# Patient Record
Sex: Male | Born: 1935 | Race: White | Hispanic: No | Marital: Married | Smoking: Former smoker
Health system: Southern US, Community
[De-identification: ages and names within clinical notes are randomized; demographics above are authoritative.]

## PROBLEM LIST (undated history)

## (undated) DIAGNOSIS — I447 Left bundle-branch block, unspecified: Secondary | ICD-10-CM

## (undated) DIAGNOSIS — I429 Cardiomyopathy, unspecified: Secondary | ICD-10-CM

## (undated) DIAGNOSIS — N183 Chronic kidney disease, stage 3 unspecified: Secondary | ICD-10-CM

## (undated) DIAGNOSIS — D72819 Decreased white blood cell count, unspecified: Secondary | ICD-10-CM

## (undated) DIAGNOSIS — Z7729 Contact with and (suspected ) exposure to other hazardous substances: Secondary | ICD-10-CM

## (undated) DIAGNOSIS — G4730 Sleep apnea, unspecified: Secondary | ICD-10-CM

## (undated) DIAGNOSIS — I10 Essential (primary) hypertension: Secondary | ICD-10-CM

## (undated) DIAGNOSIS — E119 Type 2 diabetes mellitus without complications: Secondary | ICD-10-CM

## (undated) DIAGNOSIS — E669 Obesity, unspecified: Secondary | ICD-10-CM

## (undated) HISTORY — DX: Contact with and (suspected) exposure to other hazardous substances: Z77.29

## (undated) HISTORY — PX: NASAL POLYP SURGERY: SHX186

## (undated) HISTORY — DX: Decreased white blood cell count, unspecified: D72.819

## (undated) HISTORY — DX: Obesity, unspecified: E66.9

---

## 2002-11-05 ENCOUNTER — Ambulatory Visit (HOSPITAL_COMMUNITY): Admission: RE | Admit: 2002-11-05 | Discharge: 2002-11-05 | Payer: Self-pay | Admitting: Pulmonary Disease

## 2002-12-15 ENCOUNTER — Ambulatory Visit (HOSPITAL_COMMUNITY): Admission: RE | Admit: 2002-12-15 | Discharge: 2002-12-15 | Payer: Self-pay | Admitting: Otolaryngology

## 2002-12-15 ENCOUNTER — Encounter: Payer: Self-pay | Admitting: Otolaryngology

## 2003-03-31 ENCOUNTER — Encounter: Payer: Self-pay | Admitting: Otolaryngology

## 2003-03-31 ENCOUNTER — Encounter: Admission: RE | Admit: 2003-03-31 | Discharge: 2003-03-31 | Payer: Self-pay | Admitting: Otolaryngology

## 2003-04-01 ENCOUNTER — Encounter: Payer: Self-pay | Admitting: Otolaryngology

## 2003-04-01 ENCOUNTER — Ambulatory Visit (HOSPITAL_COMMUNITY): Admission: RE | Admit: 2003-04-01 | Discharge: 2003-04-01 | Payer: Self-pay | Admitting: Otolaryngology

## 2003-04-02 ENCOUNTER — Ambulatory Visit (HOSPITAL_COMMUNITY): Admission: RE | Admit: 2003-04-02 | Discharge: 2003-04-02 | Payer: Self-pay | Admitting: Otolaryngology

## 2003-04-02 ENCOUNTER — Encounter: Payer: Self-pay | Admitting: Otolaryngology

## 2003-04-12 ENCOUNTER — Encounter (INDEPENDENT_AMBULATORY_CARE_PROVIDER_SITE_OTHER): Payer: Self-pay | Admitting: *Deleted

## 2003-04-12 ENCOUNTER — Ambulatory Visit (HOSPITAL_BASED_OUTPATIENT_CLINIC_OR_DEPARTMENT_OTHER): Admission: RE | Admit: 2003-04-12 | Discharge: 2003-04-12 | Payer: Self-pay | Admitting: Otolaryngology

## 2006-12-31 ENCOUNTER — Encounter (HOSPITAL_COMMUNITY): Admission: RE | Admit: 2006-12-31 | Discharge: 2007-01-30 | Payer: Self-pay | Admitting: Oncology

## 2006-12-31 ENCOUNTER — Ambulatory Visit (HOSPITAL_COMMUNITY): Payer: Self-pay | Admitting: Oncology

## 2007-01-31 ENCOUNTER — Encounter (HOSPITAL_COMMUNITY): Admission: RE | Admit: 2007-01-31 | Discharge: 2007-03-02 | Payer: Self-pay | Admitting: Oncology

## 2007-02-28 ENCOUNTER — Ambulatory Visit (HOSPITAL_COMMUNITY): Payer: Self-pay | Admitting: Oncology

## 2007-04-01 ENCOUNTER — Encounter (HOSPITAL_COMMUNITY): Admission: RE | Admit: 2007-04-01 | Discharge: 2007-05-01 | Payer: Self-pay | Admitting: Oncology

## 2007-04-29 ENCOUNTER — Ambulatory Visit (HOSPITAL_COMMUNITY): Payer: Self-pay | Admitting: Oncology

## 2007-10-29 ENCOUNTER — Encounter (HOSPITAL_COMMUNITY): Admission: RE | Admit: 2007-10-29 | Discharge: 2007-11-28 | Payer: Self-pay | Admitting: Oncology

## 2007-10-29 ENCOUNTER — Ambulatory Visit (HOSPITAL_COMMUNITY): Payer: Self-pay | Admitting: Oncology

## 2007-12-01 ENCOUNTER — Encounter (HOSPITAL_COMMUNITY): Admission: RE | Admit: 2007-12-01 | Discharge: 2007-12-31 | Payer: Self-pay | Admitting: Oncology

## 2007-12-24 ENCOUNTER — Ambulatory Visit (HOSPITAL_COMMUNITY): Payer: Self-pay | Admitting: Oncology

## 2008-06-28 ENCOUNTER — Ambulatory Visit (HOSPITAL_COMMUNITY): Payer: Self-pay | Admitting: Oncology

## 2009-06-27 ENCOUNTER — Ambulatory Visit (HOSPITAL_COMMUNITY): Payer: Self-pay | Admitting: Oncology

## 2009-08-24 ENCOUNTER — Ambulatory Visit (HOSPITAL_COMMUNITY): Payer: Self-pay | Admitting: Oncology

## 2009-08-24 ENCOUNTER — Encounter (HOSPITAL_COMMUNITY): Admission: RE | Admit: 2009-08-24 | Discharge: 2009-09-23 | Payer: Self-pay | Admitting: Oncology

## 2010-02-23 ENCOUNTER — Ambulatory Visit (HOSPITAL_COMMUNITY): Payer: Self-pay | Admitting: Oncology

## 2010-02-23 ENCOUNTER — Encounter (HOSPITAL_COMMUNITY)
Admission: RE | Admit: 2010-02-23 | Discharge: 2010-03-25 | Payer: Self-pay | Source: Home / Self Care | Admitting: Oncology

## 2010-06-15 ENCOUNTER — Ambulatory Visit (HOSPITAL_COMMUNITY): Admission: RE | Admit: 2010-06-15 | Discharge: 2010-06-15 | Payer: Self-pay | Admitting: Ophthalmology

## 2010-06-29 ENCOUNTER — Ambulatory Visit (HOSPITAL_COMMUNITY): Admission: RE | Admit: 2010-06-29 | Discharge: 2010-06-29 | Payer: Self-pay | Admitting: Ophthalmology

## 2010-07-22 ENCOUNTER — Inpatient Hospital Stay (HOSPITAL_COMMUNITY)
Admission: EM | Admit: 2010-07-22 | Discharge: 2010-07-25 | Payer: Self-pay | Source: Home / Self Care | Admitting: Emergency Medicine

## 2010-08-25 ENCOUNTER — Ambulatory Visit (HOSPITAL_COMMUNITY)
Admission: RE | Admit: 2010-08-25 | Discharge: 2010-08-25 | Payer: Self-pay | Source: Home / Self Care | Attending: Pulmonary Disease | Admitting: Pulmonary Disease

## 2010-08-25 ENCOUNTER — Ambulatory Visit (HOSPITAL_COMMUNITY)
Admission: RE | Admit: 2010-08-25 | Discharge: 2010-09-19 | Payer: Self-pay | Source: Home / Self Care | Attending: Oncology | Admitting: Oncology

## 2010-08-25 ENCOUNTER — Encounter (HOSPITAL_COMMUNITY)
Admission: RE | Admit: 2010-08-25 | Discharge: 2010-09-19 | Payer: Self-pay | Source: Home / Self Care | Attending: Oncology | Admitting: Oncology

## 2010-08-25 LAB — BASIC METABOLIC PANEL
BUN: 38 mg/dL — ABNORMAL HIGH (ref 6–23)
CO2: 26 mEq/L (ref 19–32)
Calcium: 9.2 mg/dL (ref 8.4–10.5)
Chloride: 109 mEq/L (ref 96–112)
Creatinine, Ser: 1.77 mg/dL — ABNORMAL HIGH (ref 0.4–1.5)
GFR calc Af Amer: 45 mL/min — ABNORMAL LOW (ref >60)
GFR calc non Af Amer: 38 mL/min — ABNORMAL LOW (ref >60)
Glucose, Bld: 105 mg/dL — ABNORMAL HIGH (ref 70–99)
Potassium: 4.7 mEq/L (ref 3.5–5.1)
Sodium: 141 mEq/L (ref 135–145)

## 2010-08-25 LAB — CBC
HCT: 31.2 % — ABNORMAL LOW (ref 39.0–52.0)
Hemoglobin: 10.2 g/dL — ABNORMAL LOW (ref 13.0–17.0)
MCH: 32.5 pg (ref 26.0–34.0)
MCHC: 32.7 g/dL (ref 30.0–36.0)
MCV: 99.4 fL (ref 78.0–100.0)
Platelets: 136 10*3/uL — ABNORMAL LOW (ref 150–400)
RBC: 3.14 MIL/uL — ABNORMAL LOW (ref 4.22–5.81)
RDW: 14.2 % (ref 11.5–15.5)
WBC: 4.2 10*3/uL (ref 4.0–10.5)

## 2010-08-25 LAB — DIFFERENTIAL
Basophils Absolute: 0 10*3/uL (ref 0.0–0.1)
Basophils Relative: 1 % (ref 0–1)
Eosinophils Absolute: 0.2 10*3/uL (ref 0.0–0.7)
Eosinophils Relative: 5 % (ref 0–5)
Lymphocytes Relative: 35 % (ref 12–46)
Lymphs Abs: 1.5 10*3/uL (ref 0.7–4.0)
Monocytes Absolute: 0.3 10*3/uL (ref 0.1–1.0)
Monocytes Relative: 8 % (ref 3–12)
Neutro Abs: 2.2 10*3/uL (ref 1.7–7.7)
Neutrophils Relative %: 52 % (ref 43–77)

## 2010-08-25 LAB — RETICULOCYTES
RBC.: 3.14 MIL/uL — ABNORMAL LOW (ref 4.22–5.81)
Retic Count, Absolute: 28.3 10*3/uL (ref 19.0–186.0)
Retic Ct Pct: 0.9 % (ref 0.4–3.1)

## 2010-09-04 LAB — IRON AND TIBC
Iron: 88 ug/dL (ref 42–135)
Saturation Ratios: 30 % (ref 20–55)
TIBC: 292 ug/dL (ref 215–435)
UIBC: 204 ug/dL

## 2010-09-04 LAB — FOLATE: Folate: 11.1 ng/mL

## 2010-09-04 LAB — VITAMIN B12: Vitamin B-12: 283 pg/mL (ref 211–911)

## 2010-09-04 LAB — FERRITIN: Ferritin: 63 ng/mL (ref 22–322)

## 2010-10-31 LAB — CULTURE, BLOOD (ROUTINE X 2)
Culture: NO GROWTH
Culture: NO GROWTH

## 2010-10-31 LAB — CBC
HCT: 29.1 % — ABNORMAL LOW (ref 39.0–52.0)
HCT: 30.8 % — ABNORMAL LOW (ref 39.0–52.0)
Hemoglobin: 10 g/dL — ABNORMAL LOW (ref 13.0–17.0)
Hemoglobin: 9.6 g/dL — ABNORMAL LOW (ref 13.0–17.0)
MCH: 32.1 pg (ref 26.0–34.0)
MCH: 32.7 pg (ref 26.0–34.0)
MCHC: 32.5 g/dL (ref 30.0–36.0)
MCHC: 33 g/dL (ref 30.0–36.0)
MCV: 98.7 fL (ref 78.0–100.0)
MCV: 99 fL (ref 78.0–100.0)
Platelets: 174 10*3/uL (ref 150–400)
Platelets: 182 K/uL (ref 150–400)
RBC: 2.94 MIL/uL — ABNORMAL LOW (ref 4.22–5.81)
RBC: 3.12 MIL/uL — ABNORMAL LOW (ref 4.22–5.81)
RDW: 13.9 % (ref 11.5–15.5)
RDW: 14 % (ref 11.5–15.5)
WBC: 4 10*3/uL (ref 4.0–10.5)
WBC: 7.9 K/uL (ref 4.0–10.5)

## 2010-10-31 LAB — BLOOD GAS, ARTERIAL
Acid-base deficit: 4.4 mmol/L — ABNORMAL HIGH (ref 0.0–2.0)
Bicarbonate: 19.6 mEq/L — ABNORMAL LOW (ref 20.0–24.0)
O2 Content: 4 L/min
O2 Saturation: 94.6 %
Patient temperature: 40
TCO2: 18.5 mmol/L (ref 0–100)
pCO2 arterial: 32.5 mmHg — ABNORMAL LOW (ref 35.0–45.0)
pH, Arterial: 7.397 (ref 7.350–7.450)
pO2, Arterial: 70.5 mmHg — ABNORMAL LOW (ref 80.0–100.0)

## 2010-10-31 LAB — DIFFERENTIAL
Basophils Absolute: 0 10*3/uL (ref 0.0–0.1)
Basophils Absolute: 0 10*3/uL (ref 0.0–0.1)
Basophils Relative: 0 % (ref 0–1)
Basophils Relative: 0 % (ref 0–1)
Eosinophils Absolute: 0.1 10*3/uL (ref 0.0–0.7)
Eosinophils Absolute: 0.2 10*3/uL (ref 0.0–0.7)
Eosinophils Relative: 2 % (ref 0–5)
Eosinophils Relative: 3 % (ref 0–5)
Lymphocytes Relative: 11 % — ABNORMAL LOW (ref 12–46)
Lymphocytes Relative: 6 % — ABNORMAL LOW (ref 12–46)
Lymphs Abs: 0.2 10*3/uL — ABNORMAL LOW (ref 0.7–4.0)
Monocytes Absolute: 0.2 10*3/uL (ref 0.1–1.0)
Monocytes Absolute: 0.5 10*3/uL (ref 0.1–1.0)
Monocytes Relative: 6 % (ref 3–12)
Neutro Abs: 3.5 10*3/uL (ref 1.7–7.7)
Neutrophils Relative %: 86 % — ABNORMAL HIGH (ref 43–77)

## 2010-10-31 LAB — BASIC METABOLIC PANEL
BUN: 29 mg/dL — ABNORMAL HIGH (ref 6–23)
BUN: 42 mg/dL — ABNORMAL HIGH (ref 6–23)
CO2: 21 mEq/L (ref 19–32)
Calcium: 8.7 mg/dL (ref 8.4–10.5)
Chloride: 108 mEq/L (ref 96–112)
Chloride: 114 mEq/L — ABNORMAL HIGH (ref 96–112)
Creatinine, Ser: 1.68 mg/dL — ABNORMAL HIGH (ref 0.4–1.5)
Creatinine, Ser: 2.31 mg/dL — ABNORMAL HIGH (ref 0.4–1.5)
GFR calc Af Amer: 33 mL/min — ABNORMAL LOW (ref >60)
GFR calc non Af Amer: 28 mL/min — ABNORMAL LOW (ref >60)
Glucose, Bld: 136 mg/dL — ABNORMAL HIGH (ref 70–99)
Glucose, Bld: 206 mg/dL — ABNORMAL HIGH (ref 70–99)
Potassium: 4.4 mEq/L (ref 3.5–5.1)
Sodium: 138 mEq/L (ref 135–145)

## 2010-10-31 LAB — URINALYSIS, ROUTINE W REFLEX MICROSCOPIC
Bilirubin Urine: NEGATIVE
Glucose, UA: NEGATIVE mg/dL
Ketones, ur: NEGATIVE mg/dL
Leukocytes, UA: NEGATIVE
Nitrite: NEGATIVE
Protein, ur: 30 mg/dL — AB
Specific Gravity, Urine: 1.025 (ref 1.005–1.030)
Urobilinogen, UA: 0.2 mg/dL (ref 0.0–1.0)
pH: 5.5 (ref 5.0–8.0)

## 2010-10-31 LAB — GLUCOSE, CAPILLARY
Glucose-Capillary: 120 mg/dL — ABNORMAL HIGH (ref 70–99)
Glucose-Capillary: 127 mg/dL — ABNORMAL HIGH (ref 70–99)
Glucose-Capillary: 127 mg/dL — ABNORMAL HIGH (ref 70–99)
Glucose-Capillary: 128 mg/dL — ABNORMAL HIGH (ref 70–99)
Glucose-Capillary: 136 mg/dL — ABNORMAL HIGH (ref 70–99)
Glucose-Capillary: 139 mg/dL — ABNORMAL HIGH (ref 70–99)
Glucose-Capillary: 143 mg/dL — ABNORMAL HIGH (ref 70–99)
Glucose-Capillary: 145 mg/dL — ABNORMAL HIGH (ref 70–99)
Glucose-Capillary: 150 mg/dL — ABNORMAL HIGH (ref 70–99)
Glucose-Capillary: 151 mg/dL — ABNORMAL HIGH (ref 70–99)
Glucose-Capillary: 180 mg/dL — ABNORMAL HIGH (ref 70–99)
Glucose-Capillary: 94 mg/dL (ref 70–99)

## 2010-10-31 LAB — URINE MICROSCOPIC-ADD ON

## 2010-10-31 LAB — LACTIC ACID, PLASMA: Lactic Acid, Venous: 1.7 mmol/L (ref 0.5–2.2)

## 2010-10-31 LAB — BASIC METABOLIC PANEL WITH GFR
BUN: 38 mg/dL — ABNORMAL HIGH (ref 6–23)
CO2: 23 meq/L (ref 19–32)
Calcium: 8.2 mg/dL — ABNORMAL LOW (ref 8.4–10.5)
Chloride: 112 meq/L (ref 96–112)
Creatinine, Ser: 2.01 mg/dL — ABNORMAL HIGH (ref 0.4–1.5)
GFR calc non Af Amer: 32 mL/min — ABNORMAL LOW
Glucose, Bld: 123 mg/dL — ABNORMAL HIGH (ref 70–99)
Potassium: 4.3 meq/L (ref 3.5–5.1)
Sodium: 141 meq/L (ref 135–145)

## 2010-10-31 LAB — HEPATIC FUNCTION PANEL
ALT: 14 U/L (ref 0–53)
AST: 21 U/L (ref 0–37)
Albumin: 3.2 g/dL — ABNORMAL LOW (ref 3.5–5.2)
Alkaline Phosphatase: 37 U/L — ABNORMAL LOW (ref 39–117)
Bilirubin, Direct: 0.1 mg/dL (ref 0.0–0.3)
Indirect Bilirubin: 0.3 mg/dL (ref 0.3–0.9)
Total Bilirubin: 0.4 mg/dL (ref 0.3–1.2)
Total Protein: 5.8 g/dL — ABNORMAL LOW (ref 6.0–8.3)

## 2010-10-31 LAB — HEMOGLOBIN A1C
Hgb A1c MFr Bld: 6.2 % — ABNORMAL HIGH (ref <5.7)
Mean Plasma Glucose: 131 mg/dL — ABNORMAL HIGH (ref <117)

## 2010-11-01 LAB — BASIC METABOLIC PANEL
CO2: 26 mEq/L (ref 19–32)
Calcium: 9 mg/dL (ref 8.4–10.5)
Creatinine, Ser: 2.15 mg/dL — ABNORMAL HIGH (ref 0.4–1.5)
GFR calc Af Amer: 36 mL/min — ABNORMAL LOW (ref >60)
GFR calc non Af Amer: 30 mL/min — ABNORMAL LOW (ref >60)
Sodium: 145 mEq/L (ref 135–145)

## 2010-11-01 LAB — HEMOGLOBIN AND HEMATOCRIT, BLOOD
HCT: 29.9 % — ABNORMAL LOW (ref 39.0–52.0)
Hemoglobin: 10.1 g/dL — ABNORMAL LOW (ref 13.0–17.0)

## 2010-11-01 LAB — GLUCOSE, CAPILLARY: Glucose-Capillary: 100 mg/dL — ABNORMAL HIGH (ref 70–99)

## 2010-11-05 LAB — BASIC METABOLIC PANEL
CO2: 26 mEq/L (ref 19–32)
Calcium: 9.4 mg/dL (ref 8.4–10.5)
Glucose, Bld: 114 mg/dL — ABNORMAL HIGH (ref 70–99)
Potassium: 4.4 mEq/L (ref 3.5–5.1)
Sodium: 138 mEq/L (ref 135–145)

## 2010-11-05 LAB — DIFFERENTIAL
Basophils Absolute: 0 10*3/uL (ref 0.0–0.1)
Basophils Absolute: 0 10*3/uL (ref 0.0–0.1)
Basophils Relative: 1 % (ref 0–1)
Basophils Relative: 1 % (ref 0–1)
Eosinophils Absolute: 0.1 10*3/uL (ref 0.0–0.7)
Eosinophils Relative: 3 % (ref 0–5)
Monocytes Absolute: 0.4 10*3/uL (ref 0.1–1.0)
Monocytes Relative: 11 % (ref 3–12)
Neutro Abs: 2.9 10*3/uL (ref 1.7–7.7)
Neutrophils Relative %: 62 % (ref 43–77)

## 2010-11-05 LAB — RETICULOCYTES
RBC.: 2.85 MIL/uL — ABNORMAL LOW (ref 4.22–5.81)
RBC.: 3.12 MIL/uL — ABNORMAL LOW (ref 4.22–5.81)
Retic Ct Pct: 2.2 % (ref 0.4–3.1)

## 2010-11-05 LAB — CBC
HCT: 30.7 % — ABNORMAL LOW (ref 39.0–52.0)
Hemoglobin: 9.5 g/dL — ABNORMAL LOW (ref 13.0–17.0)
Hemoglobin: 10.3 g/dL — ABNORMAL LOW (ref 13.0–17.0)
MCH: 33.3 pg (ref 26.0–34.0)
MCHC: 33.5 g/dL (ref 30.0–36.0)
MCHC: 33.4 g/dL (ref 30.0–36.0)
RDW: 14.1 % (ref 11.5–15.5)
RDW: 14.3 % (ref 11.5–15.5)

## 2011-01-05 NOTE — Op Note (Signed)
NAME:  Greg [name], Greg [name]                           ACCOUNT NO.:  [id number]   MEDICAL RECORD NO.:  [id number]                   PATIENT TYPE:  OUT   LOCATION:  RAD                                  FACILITY:  [facility]   PHYSICIAN:  Karol T. Lazarus Salines, M.D.              DATE OF BIRTH:  [birth year]   DATE OF PROCEDURE:  04/12/2003  DATE OF DISCHARGE:  04/02/2003                                 OPERATIVE REPORT   PREOPERATIVE DIAGNOSES:  1. Chronic polypoid pan sinusitis.  2. Obstructive nasal polyps.   POSTOPERATIVE DIAGNOSES:  1. Chronic polypoid pan sinusitis.  2. Obstructive nasal polyps.   PROCEDURE:  Bilateral endoscopic complete ethmoidectomy.  Bilateral  endoscopic complete ethmoidectomy.  Bilateral endoscopic revision antrostomy  with removal of maxillary polyps.  Bilateral endoscopic sphenoidotomy.  InstaTrak guidance system used.   SURGEON:  Gloris Manchester. Lazarus Salines, M.D.   ANESTHESIA:  General orotracheal.   ESTIMATED BLOOD LOSS:  200 mL.   COMPLICATIONS:  None.   FINDINGS:  Attenuated right middle turbinate with polyps coming through the  middle turbinate and from the middle meatus.  Polypoid changes throughout  the sinus cells on the right side.  A small residual antrostomy from the  previous surgery.  On the left side, a partially amputated middle turbinate  with a large antrostomy and once again polypoid material in the middle  meatus and sinuses.  No active infection on either side.  Inspissated mucous  diffusely.  Moderate oozing.  No dehiscence of lamina, papyracea or fovea  ethmoidalis.   DESCRIPTION OF PROCEDURE:  With the patient in a comfortable supine  position, general orotracheal anesthesia was induced without difficulty.  Upon continuing with the preparation, it was observed that the tube had a  substantial leak which was refilled.  The tube position was rechecked using  the hand held laryngoscope and finally it was decided that the tube had a  faulty cuff/valve  and the tube was replaced without difficulty.  From this  point forward, anesthesia was per general orotracheal tube without  difficulty.   At an appropriate level, the patient was placed in a slight sitting  position.  A saline moistened throat pack was placed.  Nasal vibrissae were  trimmed.  Cocaine crystals, 200 mg total, were applied on cotton carriers to  the anterior ethmoid and sphenopalatine ganglion regions on both sides.  Xylocaine plus phenylephrine solution was placed on 0.5 x 3 inch cottonoids  along both sides of the septal mucosa.  1% Xylocaine with 1:100,000  epinephrine, 8 mL total was infiltrated into the submucoperichondrial plane  of the superior septum on both sides and into the anterior high lateral wall  of the nose for intraoperative hemostasis.  Several minutes were allowed for  this to take effect.   The InstaTrak head gear was applied in the standard fashion.  A sterile  preparation and draping of the mid  face was accomplished.   The materials were removed from the nose and observed to be intact and  correct in number.  The findings were as described above.  Using a 25 gauge  spinal needle, additional 1% Xylocaine with 1:100,000 epinephrine, 6 mL  total was infiltrated into the middle meatus and into the anterior pole of  the middle turbinate on the right side and into the lateral wall of the nose  on the left side.  Several additional minutes were allowed for hemostasis  including some additional cotton pledgets with Xylocaine with phenylephrine  liquid placed into the middle meatus and between the middle turbinate and  the septum.  At this time, the InstaTrak orientation system was calibrated  and verified.   Beginning on the right side, the materials were removed, and the nose  observed to be intact and correct in number.  The findings were as described  above including 0 degree endoscopy. Using the power debrider, gross polyps  were resected from the  middle meatus and from the accessory ostium coming  through the middle turbinate.  A sickle knife was used to probe the lateral  anterior middle meatus, and no clear cut uncinate process was identified.  Using the [facility] for orientation, the bulla was identified and opened  with the punch forceps and then cleaned with the debrider.  A curved suction  tip was placed into the antrum.  The antrostomy was opened posteriorly and  then anteriorly.  There were some bony remnants consistent with uncinate  process which was mobilized and debrided.  There were several large polyps  in the antrum which were removed using the curved debrider tip.   Taking care to preserve the horizontal lamella of the middle turbinate, the  ethmoid protrusions were broken down medially and inferiorly and carried  posteriorly.  The InstaTrak orientation was used.  The superior turbinate  was identified and removed and the sphenoid ostium was identified and  enlarged first inferomedially and then slightly laterally.  The bony  partitions were broken down between large posterior ethmoids cells and the  sphenoid sinus.  Working along the fovea, partitions were explored with the  punch forceps and broken down and debrided.  The dissection was carried  forward along the lamina and the fovea.   A 30 degree scope was used to inspect the anterior medial meatus.  A suction  tip and punch forceps were used to open some agger nasi cells which were  debrided.  Some polypoid mucosa and bony partitions were identified leading  into the frontal recess which were debrided.  The 45 degree angled InstaTrak  tip was used to orient and did indeed reveal penetration into the frontal  sinus.  A large opening was accomplished.  At this point, most partitions  had been broken down.  There were no residual polyps or cells, and the  antrum was clean.  Hemostasis was observed.  Palpation on the globe revealed no dehiscence of the lamina  papyracea.  No spinal fluid leakage was noted.  A cottonoid was placed into the medial meatus and another one between the  turbinate and the septum, again moistened with 4% Xylocaine solution for  hemostasis and the right side was completed.   The left side was approached and the cottonoids were removed.  The gross  polyps were debrided.  There was not much middle turbinate left on this  side.  There was a large antrostomy which was extended posteriorly and  anteriorly.  There was no residual uncinate process.  The bulla was  identified and opened and the ethmoid partitions were carried back to the  sphenoid.  The sphenoidotomy was opened.  A large Onodi cell had partitions  which were broken to communicate with the sphenoid sinus and again the  dissection was carried along the fovea and lamina and brought forward.  A 30  degree scope was used to inspect the  agger nasi region which was debrided  and a large frontal recess was opened with removal of some small polyps and  bony partitions.  Polyps in the antrum were removed with the power debrider.  Again, no violation of fovea or lamina papyracea was identified.  To  complete the left side, pledgets were placed once again in the middle meatus  and between the middle turbinate and the septum.   Attention was turned to the right side once again and the pledgets were  removed.  Hemostasis was observed.  There were no residual partitions or  visible polyps that needed debridement.  This side was completed.   Returning to the left side, there was some oozing at the anterior face of  the sphenoid which was controlled with electrocautery.  Once again, all  partitions were broken down and no violation of lamina papyracea or fovea  ethmoidalis on either side was identified.   At this point, a triple thickness Neosporin impregnated Telfa pack with a 2-  0 silk tag was placed into the middle meatus on both sides for hemostasis.  These were  secured externally at the columella by tying them one to another.  The nose was suctioned free, and hemostasis was observed.  The pharynx was  suctioned free, and the throat pack was removed.  The patient was returned  to anesthesia, awakened, extubated and transferred to recovery in stable  condition.   COMMENT:  A 77 year old white male with a very long history of obstructing  nasal polyposis failing to respond no maximal medical management was the  indication for today's procedure.  Anticipate a routine postoperative  recovery with attention to ice, elevation, analgesia, antibiosis.  We will  have him rinse his throat, use for drip pads for anterior nasal drainage,  and monitor his vision on both sides.  We will remove the packing in two  days.  Given low anticipated risk for post anesthetic or post surgical  complications, I feel an outpatient venue is appropriate.                                               Gloris Manchester. Lazarus Salines, M.D.   KTW/MEDQ  D:  04/12/2003  T:  04/12/2003  Job:  [id number]   cc:   Ramon Dredge L. Juanetta Gosling, M.D.  [address]  [place]  [place] [address]  Fax: [phone]

## 2011-01-29 ENCOUNTER — Encounter (HOSPITAL_COMMUNITY): Payer: Self-pay

## 2011-02-10 ENCOUNTER — Encounter (HOSPITAL_COMMUNITY): Payer: Self-pay | Admitting: Oncology

## 2011-02-10 ENCOUNTER — Other Ambulatory Visit (HOSPITAL_COMMUNITY): Payer: Self-pay | Admitting: Oncology

## 2011-02-10 DIAGNOSIS — D72819 Decreased white blood cell count, unspecified: Secondary | ICD-10-CM | POA: Insufficient documentation

## 2011-02-10 DIAGNOSIS — D638 Anemia in other chronic diseases classified elsewhere: Secondary | ICD-10-CM | POA: Insufficient documentation

## 2011-02-23 ENCOUNTER — Other Ambulatory Visit (HOSPITAL_COMMUNITY): Payer: Medicare Other

## 2011-02-26 ENCOUNTER — Encounter (HOSPITAL_COMMUNITY): Payer: Medicare Other | Attending: Oncology | Admitting: Oncology

## 2011-02-26 ENCOUNTER — Encounter (HOSPITAL_COMMUNITY): Payer: Self-pay | Admitting: Oncology

## 2011-02-26 ENCOUNTER — Ambulatory Visit (HOSPITAL_COMMUNITY): Payer: Self-pay | Admitting: Oncology

## 2011-02-26 DIAGNOSIS — N183 Chronic kidney disease, stage 3 unspecified: Secondary | ICD-10-CM | POA: Insufficient documentation

## 2011-02-27 ENCOUNTER — Other Ambulatory Visit (HOSPITAL_COMMUNITY): Payer: Self-pay | Admitting: Oncology

## 2011-05-14 LAB — DIFFERENTIAL
Basophils Relative: 0
Lymphs Abs: 1.5
Monocytes Relative: 12
Neutro Abs: 1.9
Neutrophils Relative %: 46

## 2011-05-14 LAB — CREATININE CLEARANCE, URINE, 24 HOUR
Collection Interval-CRCL: 24
Creatinine, 24H Ur: 1564
Creatinine, Urine: 127.7

## 2011-05-14 LAB — CBC
RBC: 3.24 — ABNORMAL LOW
WBC: 4.1

## 2011-05-15 LAB — CREATININE CLEARANCE, URINE, 24 HOUR
Collection Interval-CRCL: 24
Creatinine, Urine: 118.8
Creatinine: 1.6 — ABNORMAL HIGH (ref 0.40–1.50)
Urine Total Volume-CRCL: 1425

## 2011-05-15 LAB — COMPREHENSIVE METABOLIC PANEL
AST: 20
BUN: 35 — ABNORMAL HIGH
CO2: 27
Calcium: 9
Creatinine, Ser: 1.6 — ABNORMAL HIGH
GFR calc Af Amer: 52 — ABNORMAL LOW
GFR calc non Af Amer: 43 — ABNORMAL LOW

## 2011-06-01 LAB — CBC
MCHC: 33.7
MCV: 97.4
Platelets: 177
RDW: 14.5 — ABNORMAL HIGH
WBC: 4.3

## 2011-06-01 LAB — DIFFERENTIAL
Basophils Absolute: 0
Basophils Relative: 0
Eosinophils Absolute: 0.2
Eosinophils Relative: 4
Neutrophils Relative %: 62

## 2011-06-04 LAB — CBC
Platelets: 168
RBC: 3.11 — ABNORMAL LOW
WBC: 4.2

## 2011-06-04 LAB — DIFFERENTIAL
Lymphs Abs: 1.2
Monocytes Relative: 11
Neutro Abs: 2.3
Neutrophils Relative %: 55

## 2011-06-05 LAB — DIFFERENTIAL
Basophils Absolute: 0
Eosinophils Absolute: 0.2
Eosinophils Relative: 4
Lymphs Abs: 1.3
Neutrophils Relative %: 54

## 2011-06-05 LAB — COMPREHENSIVE METABOLIC PANEL
ALT: 13
AST: 20
CO2: 27
Calcium: 8.9
Chloride: 106
Creatinine, Ser: 1.45
GFR calc non Af Amer: 48 — ABNORMAL LOW
Glucose, Bld: 110 — ABNORMAL HIGH
Sodium: 139
Total Bilirubin: 0.8

## 2011-06-05 LAB — CBC
Hemoglobin: 10.5 — ABNORMAL LOW
MCHC: 33.4
MCV: 97.3
RBC: 3.24 — ABNORMAL LOW
WBC: 4.3

## 2011-06-07 LAB — CBC
MCV: 97.5
Platelets: 185
RBC: 3.19 — ABNORMAL LOW
WBC: 5.1

## 2011-06-07 LAB — DIFFERENTIAL
Eosinophils Absolute: 0.2
Lymphocytes Relative: 31
Lymphs Abs: 1.6
Monocytes Relative: 12 — ABNORMAL HIGH
Neutro Abs: 2.7
Neutrophils Relative %: 53

## 2013-01-06 ENCOUNTER — Other Ambulatory Visit (HOSPITAL_COMMUNITY): Payer: Self-pay | Admitting: Pulmonary Disease

## 2013-01-06 DIAGNOSIS — M79604 Pain in right leg: Secondary | ICD-10-CM

## 2013-01-07 ENCOUNTER — Ambulatory Visit (HOSPITAL_COMMUNITY)
Admission: RE | Admit: 2013-01-07 | Discharge: 2013-01-07 | Disposition: A | Discharge status: Home / Self Care | Payer: Medicare Other | Source: Ambulatory Visit | Attending: Pulmonary Disease | Admitting: Pulmonary Disease

## 2013-01-07 DIAGNOSIS — M79609 Pain in unspecified limb: Secondary | ICD-10-CM | POA: Insufficient documentation

## 2013-01-07 DIAGNOSIS — M79604 Pain in right leg: Secondary | ICD-10-CM

## 2013-08-17 ENCOUNTER — Other Ambulatory Visit (HOSPITAL_COMMUNITY): Payer: Self-pay | Admitting: Pulmonary Disease

## 2013-08-17 DIAGNOSIS — K469 Unspecified abdominal hernia without obstruction or gangrene: Secondary | ICD-10-CM

## 2013-08-19 ENCOUNTER — Telehealth: Payer: Self-pay | Admitting: Gastroenterology

## 2013-08-19 ENCOUNTER — Encounter: Payer: Self-pay | Admitting: Gastroenterology

## 2013-08-19 ENCOUNTER — Ambulatory Visit (HOSPITAL_COMMUNITY)
Admission: RE | Admit: 2013-08-19 | Discharge: 2013-08-19 | Disposition: A | Discharge status: Home / Self Care | Payer: Medicare Other | Source: Ambulatory Visit | Attending: Pulmonary Disease | Admitting: Pulmonary Disease

## 2013-08-19 DIAGNOSIS — R933 Abnormal findings on diagnostic imaging of other parts of digestive tract: Secondary | ICD-10-CM | POA: Insufficient documentation

## 2013-08-19 DIAGNOSIS — R634 Abnormal weight loss: Secondary | ICD-10-CM | POA: Insufficient documentation

## 2013-08-19 DIAGNOSIS — R109 Unspecified abdominal pain: Secondary | ICD-10-CM | POA: Insufficient documentation

## 2013-08-19 DIAGNOSIS — K469 Unspecified abdominal hernia without obstruction or gangrene: Secondary | ICD-10-CM

## 2013-08-19 DIAGNOSIS — N289 Disorder of kidney and ureter, unspecified: Secondary | ICD-10-CM | POA: Insufficient documentation

## 2013-08-19 DIAGNOSIS — E118 Type 2 diabetes mellitus with unspecified complications: Secondary | ICD-10-CM | POA: Insufficient documentation

## 2013-08-19 DIAGNOSIS — K439 Ventral hernia without obstruction or gangrene: Secondary | ICD-10-CM | POA: Insufficient documentation

## 2013-08-19 NOTE — Telephone Encounter (Signed)
CALLED BY DR. Juanetta Gosling REGARDING CT FINDINGS. DISCUSSED WITH DR. Kearney Hard FAVORS INFECTIOUS OVER ISCHMIC. ILEITIS DISTAL TO HERNIA SAC. PT ON KELFLEX FOR SINUS PROBLEMS. AGREE WITH CIP/FLAG FOR 10 DAYS. NOV WITH RGA E30 IN 3-4 WEEKS, DX: ENTERITIS, ABD PAIN.

## 2013-08-19 NOTE — Telephone Encounter (Signed)
Pt is aware of OV on 1/27 at 1130 with LSL and appt card was mailed

## 2013-09-02 ENCOUNTER — Inpatient Hospital Stay (HOSPITAL_COMMUNITY): Payer: Medicare Other

## 2013-09-02 ENCOUNTER — Inpatient Hospital Stay (HOSPITAL_COMMUNITY)
Admission: AD | Admit: 2013-09-02 | Discharge: 2013-09-08 | DRG: 394 | Disposition: A | Discharge status: Home / Self Care | Payer: Medicare Other | Source: Ambulatory Visit | Attending: Pulmonary Disease | Admitting: Pulmonary Disease

## 2013-09-02 DIAGNOSIS — D696 Thrombocytopenia, unspecified: Secondary | ICD-10-CM | POA: Diagnosis present

## 2013-09-02 DIAGNOSIS — F17200 Nicotine dependence, unspecified, uncomplicated: Secondary | ICD-10-CM | POA: Diagnosis present

## 2013-09-02 DIAGNOSIS — Z8249 Family history of ischemic heart disease and other diseases of the circulatory system: Secondary | ICD-10-CM

## 2013-09-02 DIAGNOSIS — I129 Hypertensive chronic kidney disease with stage 1 through stage 4 chronic kidney disease, or unspecified chronic kidney disease: Secondary | ICD-10-CM | POA: Diagnosis present

## 2013-09-02 DIAGNOSIS — Z833 Family history of diabetes mellitus: Secondary | ICD-10-CM

## 2013-09-02 DIAGNOSIS — K56609 Unspecified intestinal obstruction, unspecified as to partial versus complete obstruction: Secondary | ICD-10-CM | POA: Diagnosis present

## 2013-09-02 DIAGNOSIS — N183 Chronic kidney disease, stage 3 unspecified: Secondary | ICD-10-CM | POA: Diagnosis present

## 2013-09-02 DIAGNOSIS — Z01810 Encounter for preprocedural cardiovascular examination: Secondary | ICD-10-CM

## 2013-09-02 DIAGNOSIS — D638 Anemia in other chronic diseases classified elsewhere: Secondary | ICD-10-CM | POA: Diagnosis present

## 2013-09-02 DIAGNOSIS — N2581 Secondary hyperparathyroidism of renal origin: Secondary | ICD-10-CM | POA: Diagnosis present

## 2013-09-02 DIAGNOSIS — I498 Other specified cardiac arrhythmias: Secondary | ICD-10-CM | POA: Diagnosis present

## 2013-09-02 DIAGNOSIS — N179 Acute kidney failure, unspecified: Secondary | ICD-10-CM | POA: Diagnosis present

## 2013-09-02 DIAGNOSIS — E669 Obesity, unspecified: Secondary | ICD-10-CM | POA: Diagnosis present

## 2013-09-02 DIAGNOSIS — Z823 Family history of stroke: Secondary | ICD-10-CM

## 2013-09-02 DIAGNOSIS — E860 Dehydration: Secondary | ICD-10-CM | POA: Diagnosis present

## 2013-09-02 DIAGNOSIS — R9431 Abnormal electrocardiogram [ECG] [EKG]: Secondary | ICD-10-CM

## 2013-09-02 DIAGNOSIS — E119 Type 2 diabetes mellitus without complications: Secondary | ICD-10-CM | POA: Diagnosis present

## 2013-09-02 DIAGNOSIS — K529 Noninfective gastroenteritis and colitis, unspecified: Secondary | ICD-10-CM | POA: Diagnosis present

## 2013-09-02 DIAGNOSIS — K436 Other and unspecified ventral hernia with obstruction, without gangrene: Principal | ICD-10-CM | POA: Diagnosis present

## 2013-09-02 LAB — CBC WITH DIFFERENTIAL/PLATELET
Basophils Absolute: 0 10*3/uL (ref 0.0–0.1)
Basophils Relative: 0 % (ref 0–1)
EOS ABS: 0 10*3/uL (ref 0.0–0.7)
Eosinophils Relative: 0 % (ref 0–5)
HCT: 32.3 % — ABNORMAL LOW (ref 39.0–52.0)
HEMOGLOBIN: 10.3 g/dL — AB (ref 13.0–17.0)
Lymphocytes Relative: 10 % — ABNORMAL LOW (ref 12–46)
Lymphs Abs: 0.8 10*3/uL (ref 0.7–4.0)
MCH: 32.8 pg (ref 26.0–34.0)
MCHC: 31.9 g/dL (ref 30.0–36.0)
MCV: 102.9 fL — AB (ref 78.0–100.0)
MONO ABS: 0.6 10*3/uL (ref 0.1–1.0)
MONOS PCT: 8 % (ref 3–12)
NEUTROS ABS: 6.2 10*3/uL (ref 1.7–7.7)
Neutrophils Relative %: 81 % — ABNORMAL HIGH (ref 43–77)
Platelets: 159 10*3/uL (ref 150–400)
RBC: 3.14 MIL/uL — ABNORMAL LOW (ref 4.22–5.81)
RDW: 14.7 % (ref 11.5–15.5)
WBC: 7.7 10*3/uL (ref 4.0–10.5)

## 2013-09-02 LAB — COMPREHENSIVE METABOLIC PANEL
ALBUMIN: 3.1 g/dL — AB (ref 3.5–5.2)
AST: 10 U/L (ref 0–37)
Alkaline Phosphatase: 37 U/L — ABNORMAL LOW (ref 39–117)
BUN: 33 mg/dL — ABNORMAL HIGH (ref 6–23)
CO2: 20 mEq/L (ref 19–32)
Calcium: 8.6 mg/dL (ref 8.4–10.5)
Chloride: 107 mEq/L (ref 96–112)
Creatinine, Ser: 2.66 mg/dL — ABNORMAL HIGH (ref 0.50–1.35)
GFR calc Af Amer: 25 mL/min — ABNORMAL LOW (ref >90)
GFR calc non Af Amer: 21 mL/min — ABNORMAL LOW (ref >90)
Glucose, Bld: 180 mg/dL — ABNORMAL HIGH (ref 70–99)
Potassium: 4.8 mEq/L (ref 3.7–5.3)
SODIUM: 141 meq/L (ref 137–147)
TOTAL PROTEIN: 6.2 g/dL (ref 6.0–8.3)
Total Bilirubin: 0.2 mg/dL — ABNORMAL LOW (ref 0.3–1.2)

## 2013-09-02 LAB — GLUCOSE, CAPILLARY: Glucose-Capillary: 151 mg/dL — ABNORMAL HIGH (ref 70–99)

## 2013-09-02 MED ORDER — CIPROFLOXACIN IN D5W 400 MG/200ML IV SOLN
400.0000 mg | Freq: Two times a day (BID) | INTRAVENOUS | Status: DC
  Administered 2013-09-02 – 2013-09-04 (×4): 400 mg via INTRAVENOUS
  Filled 2013-09-02 (×4): qty 200

## 2013-09-02 MED ORDER — ACETAMINOPHEN 650 MG RE SUPP: 650.0000 mg | Freq: Four times a day (QID) | RECTAL | Status: DC | PRN

## 2013-09-02 MED ORDER — CIPROFLOXACIN IN D5W 400 MG/200ML IV SOLN
INTRAVENOUS | Status: AC
  Filled 2013-09-02: qty 400

## 2013-09-02 MED ORDER — ENOXAPARIN SODIUM 30 MG/0.3ML ~~LOC~~ SOLN
30.0000 mg | SUBCUTANEOUS | Status: DC
  Administered 2013-09-03: 30 mg via SUBCUTANEOUS
  Filled 2013-09-02: qty 0.3

## 2013-09-02 MED ORDER — SODIUM CHLORIDE 0.9 % IV SOLN
INTRAVENOUS | Status: DC
  Administered 2013-09-02 – 2013-09-08 (×12): via INTRAVENOUS

## 2013-09-02 MED ORDER — METRONIDAZOLE IN NACL 5-0.79 MG/ML-% IV SOLN
INTRAVENOUS | Status: AC
  Filled 2013-09-02: qty 300

## 2013-09-02 MED ORDER — HYDROMORPHONE HCL PF 1 MG/ML IJ SOLN: 0.5000 mg | INTRAMUSCULAR | Status: DC | PRN

## 2013-09-02 MED ORDER — HYDROCODONE-ACETAMINOPHEN 5-325 MG PO TABS: 1.0000 | ORAL_TABLET | ORAL | Status: DC | PRN

## 2013-09-02 MED ORDER — INSULIN ASPART 100 UNIT/ML ~~LOC~~ SOLN
0.0000 [IU] | Freq: Three times a day (TID) | SUBCUTANEOUS | Status: DC
  Administered 2013-09-03: 1 [IU] via SUBCUTANEOUS
  Administered 2013-09-04 – 2013-09-05 (×2): 2 [IU] via SUBCUTANEOUS
  Administered 2013-09-07: 3 [IU] via SUBCUTANEOUS
  Administered 2013-09-08: 2 [IU] via SUBCUTANEOUS

## 2013-09-02 MED ORDER — ONDANSETRON HCL 4 MG/2ML IJ SOLN: 4.0000 mg | Freq: Four times a day (QID) | INTRAMUSCULAR | Status: DC | PRN

## 2013-09-02 MED ORDER — METRONIDAZOLE IN NACL 5-0.79 MG/ML-% IV SOLN
500.0000 mg | Freq: Four times a day (QID) | INTRAVENOUS | Status: DC
  Administered 2013-09-02 – 2013-09-04 (×6): 500 mg via INTRAVENOUS
  Filled 2013-09-02 (×8): qty 100

## 2013-09-02 MED ORDER — ONDANSETRON HCL 4 MG PO TABS: 4.0000 mg | ORAL_TABLET | Freq: Four times a day (QID) | ORAL | Status: DC | PRN

## 2013-09-02 MED ORDER — IOHEXOL 300 MG/ML  SOLN
50.0000 mL | Freq: Once | INTRAMUSCULAR | Status: AC | PRN
  Administered 2013-09-02: 50 mL via ORAL

## 2013-09-02 MED ORDER — ACETAMINOPHEN 325 MG PO TABS: 650.0000 mg | ORAL_TABLET | Freq: Four times a day (QID) | ORAL | Status: DC | PRN

## 2013-09-02 MED ORDER — TRAZODONE HCL 50 MG PO TABS: 25.0000 mg | ORAL_TABLET | Freq: Every evening | ORAL | Status: DC | PRN

## 2013-09-03 ENCOUNTER — Encounter (HOSPITAL_COMMUNITY): Payer: Self-pay | Admitting: *Deleted

## 2013-09-03 LAB — BASIC METABOLIC PANEL
BUN: 34 mg/dL — AB (ref 6–23)
CO2: 20 mEq/L (ref 19–32)
CREATININE: 2.65 mg/dL — AB (ref 0.50–1.35)
Calcium: 8.2 mg/dL — ABNORMAL LOW (ref 8.4–10.5)
Chloride: 109 mEq/L (ref 96–112)
GFR, EST AFRICAN AMERICAN: 25 mL/min — AB (ref >90)
GFR, EST NON AFRICAN AMERICAN: 21 mL/min — AB (ref >90)
Glucose, Bld: 131 mg/dL — ABNORMAL HIGH (ref 70–99)
Potassium: 4.3 mEq/L (ref 3.7–5.3)
Sodium: 142 mEq/L (ref 137–147)

## 2013-09-03 LAB — GLUCOSE, CAPILLARY
Glucose-Capillary: 121 mg/dL — ABNORMAL HIGH (ref 70–99)
Glucose-Capillary: 101 mg/dL — ABNORMAL HIGH (ref 70–99)
Glucose-Capillary: 119 mg/dL — ABNORMAL HIGH (ref 70–99)
Glucose-Capillary: 137 mg/dL — ABNORMAL HIGH (ref 70–99)

## 2013-09-03 LAB — CLOSTRIDIUM DIFFICILE BY PCR: Toxigenic C. Difficile by PCR: NEGATIVE

## 2013-09-03 NOTE — Consult Note (Signed)
Reason for Consult: Right Spigelian hernia Referring Physician: Dr. Debbe [name] Greg [name] is an 80 y.o. male.  HPI: 80 year old white male with multiple medical problems who presents with worsening nausea and dehydration. He has [name] known right spigelian hernia which seems to have worsened lately. He has had no vomiting, though he has had decreased appetite and is nauseous. He was noted on CT scan the abdomen to have partial obstructive bowel invalid involving the right colon and terminal ileum. Renal function was noted to be worse than previously noted. The patient states he is not having significant pain in the right lateral aspect of his abdominal wall. He states she's known these had [name] hernia there for some time now.  Past Medical History  Diagnosis Date  . Diabetes mellitus   . Anemia of chronic disease   . Leukopenia     Intermittent  . Mild renal insufficiency   . Herpes zoster     in the past  . Obesity   . Exposure to hazardous substance   . Anemia of chronic disease 02/10/2011  . Intermittent Leukopenia 02/10/2011  . Renal insufficiency 02/26/2011    Past Surgical History  Procedure Laterality Date  . Nasal polyp surgery      History reviewed. No pertinent family history.  Social History:  reports that he does not use illicit drugs. His tobacco and alcohol histories are not on file.  Allergies:  Allergies  Allergen Reactions  . Celery Oil     vomiting    Medications: I have reviewed the patient's current medications.  Results for orders placed during the hospital encounter of 09/02/13 (from the past 48 hour(s))  COMPREHENSIVE METABOLIC PANEL     Status: Abnormal   Collection Time    09/02/13  6:43 PM      Result Value Range   Sodium 141  137 - 147 mEq/L   Potassium 4.8  3.7 - 5.3 mEq/L   Chloride 107  96 - 112 mEq/L   CO2 20  19 - 32 mEq/L   Glucose, Bld 180 (*) 70 - 99 mg/dL   BUN 33 (*) 6 - 23 mg/dL   Creatinine, Ser 2.66 (*) 0.50 - 1.35 mg/dL   Calcium 8.6   8.4 - 10.5 mg/dL   Total Protein 6.2  6.0 - 8.3 g/dL   Albumin 3.1 (*) 3.5 - 5.2 g/dL   AST 10  0 - 37 U/L   ALT <5  0 - 53 U/L   Alkaline Phosphatase 37 (*) 39 - 117 U/L   Total Bilirubin 0.2 (*) 0.3 - 1.2 mg/dL   GFR calc non Af Amer 21 (*) >90 mL/min   GFR calc Af Amer 25 (*) >90 mL/min   Comment: (NOTE)     The eGFR has been calculated using the CKD EPI equation.     This calculation has not been validated in all clinical situations.     eGFR's persistently <90 mL/min signify possible Chronic Kidney     Disease.  CBC WITH DIFFERENTIAL     Status: Abnormal   Collection Time    09/02/13  6:43 PM      Result Value Range   WBC 7.7  4.0 - 10.5 K/uL   RBC 3.14 (*) 4.22 - 5.81 MIL/uL   Hemoglobin 10.3 (*) 13.0 - 17.0 g/dL   HCT 32.3 (*) 39.0 - 52.0 %   MCV 102.9 (*) 78.0 - 100.0 fL   MCH 32.8  26.0 - 34.0 pg  MCHC 31.9  30.0 - 36.0 g/dL   RDW 14.7  11.5 - 15.5 %   Platelets 159  150 - 400 K/uL   Neutrophils Relative % 81 (*) 43 - 77 %   Neutro Abs 6.2  1.7 - 7.7 K/uL   Lymphocytes Relative 10 (*) 12 - 46 %   Lymphs Abs 0.8  0.7 - 4.0 K/uL   Monocytes Relative 8  3 - 12 %   Monocytes Absolute 0.6  0.1 - 1.0 K/uL   Eosinophils Relative 0  0 - 5 %   Eosinophils Absolute 0.0  0.0 - 0.7 K/uL   Basophils Relative 0  0 - 1 %   Basophils Absolute 0.0  0.0 - 0.1 K/uL  GLUCOSE, CAPILLARY     Status: Abnormal   Collection Time    09/02/13 11:20 PM      Result Value Range   Glucose-Capillary 151 (*) 70 - 99 mg/dL  GLUCOSE, CAPILLARY     Status: Abnormal   Collection Time    09/03/13  7:13 AM      Result Value Range   Glucose-Capillary 121 (*) 70 - 99 mg/dL  BASIC METABOLIC PANEL     Status: Abnormal   Collection Time    09/03/13  9:24 AM      Result Value Range   Sodium 142  137 - 147 mEq/L   Potassium 4.3  3.7 - 5.3 mEq/L   Chloride 109  96 - 112 mEq/L   CO2 20  19 - 32 mEq/L   Glucose, Bld 131 (*) 70 - 99 mg/dL   BUN 34 (*) 6 - 23 mg/dL   Creatinine, Ser 2.65 (*) 0.50 -  1.35 mg/dL   Calcium 8.2 (*) 8.4 - 10.5 mg/dL   GFR calc non Af Amer 21 (*) >90 mL/min   GFR calc Af Amer 25 (*) >90 mL/min   Comment: (NOTE)     The eGFR has been calculated using the CKD EPI equation.     This calculation has not been validated in all clinical situations.     eGFR's persistently <90 mL/min signify possible Chronic Kidney     Disease.  GLUCOSE, CAPILLARY     Status: Abnormal   Collection Time    09/03/13 11:23 AM      Result Value Range   Glucose-Capillary 101 (*) 70 - 99 mg/dL   Comment 1 Notify RN      Ct Abdomen Pelvis Wo Contrast  09/03/2013   CLINICAL DATA:  Colitis.  EXAM: CT ABDOMEN AND PELVIS WITHOUT CONTRAST  TECHNIQUE: Multidetector CT imaging of the abdomen and pelvis was performed following the standard protocol without intravenous contrast.  COMPARISON:  08/19/2013  FINDINGS: BODY WALL: Spigelian hernia on the right, containing small bowel loops (ileum). Related findings below. There is bilateral fatty inguinal hernias and [name] small supraumbilical midline abdominal wall hernia.  LOWER CHEST: Cardiomegaly.  ABDOMEN/PELVIS:  Liver: No focal abnormality.  Biliary: No evidence of biliary obstruction or stone.  Pancreas: Unremarkable.  Spleen: Unremarkable.  Adrenals: Unremarkable.  Kidneys and ureters: Bilateral renal atrophy.  No hydronephrosis.  Bladder: Unremarkable for degree of distention.  Reproductive: Unremarkable for age.  Bowel: As above, there is [name] right lower Spigelian hernia which contains ileal loops. The loops are distended, and only partially fill with contrast. The contrast show an abrupt contrast density difference at the level of hernia entry. There is new ascites around the distended and fluid-filled loops. The terminal ileum beyond the  hernia is fluid-filled or thickened. here is still some fluid stool present within the colon. Distal colonic diverticulosis. Normal appendix.  Retroperitoneum: No mass or adenopathy.  Peritoneum: No pneumoperitoneum.   Vascular: No acute abnormality.  OSSEOUS: No acute abnormalities.  These results were called by telephone at the time of interpretation on 09/03/2013 at 2:32 AM to Dr. Sinda [name] , who verbally acknowledged these results.  IMPRESSION: 1. Small bowel obstruction secondary to right-sided Spigelian hernia. No evidence of bowel necrosis or perforation, but is reactive mesenteric edema and ascites. 2. No evidence of colitis.   Electronically Signed   By: Jorje Guild M.D.   On: 09/03/2013 02:34   X-ray Chest Pa And Lateral   09/03/2013   CLINICAL DATA:  Colitis.  EXAM: CHEST  2 VIEW  COMPARISON:  08/25/2010  FINDINGS: Chronic cardiomegaly. No consolidation or edema. No effusion or pneumothorax. No acute osseous findings.  IMPRESSION: No active cardiopulmonary disease.   Electronically Signed   By: Jorje Guild M.D.   On: 09/03/2013 02:34    ROS: See chart Blood pressure 115/55, pulse 53, temperature 97.4 F (36.3 C), temperature source Oral, resp. rate 20, height 5' 11"  (1.803 m), weight 121.836 kg (268 lb 9.6 oz), SpO2 91.00%. Physical Exam: Pleasant white male no acute distress. Abdomen is soft with an easily reducible right spigelian hernia, though it is so enlarged that it herniates back fairly quickly. No rigidity is noted in the abdomen.  Assessment/Plan: Impression: Right spigelian hernia, renal insufficiency Plan: No need for [name] acute surgical intervention at this time. Would rehydrate the patient. Will reevaluate in [name].m. as to the timing for ventral herniorrhaphy with mesh.  Greg [name] 09/03/2013, 2:19 PM

## 2013-09-03 NOTE — Progress Notes (Signed)
Subjective: He says he feels better. He is not as nauseated. He had a bowel movement this morning. His CT scan showed what appeared to be a small bowel obstruction. I was concerned that he had recurrent colitis but that does not seem to be the case  Objective: Vital signs in last 24 hours: Temp:  [97.4 F (36.3 C)-98.5 F (36.9 C)] 97.4 F (36.3 C) (01/15 0427) Pulse Rate:  [45-86] 53 (01/15 0427) Resp:  [20] 20 (01/15 0427) BP: (110-148)/(55-64) 115/55 mmHg (01/15 0427) SpO2:  [91 %-93 %] 91 % (01/15 0427) Weight:  [118.842 kg (262 lb)-121.836 kg (268 lb 9.6 oz)] 121.836 kg (268 lb 9.6 oz) (01/15 0500) Weight change:  Last BM Date: 09/01/13  Intake/Output from previous day: 01/14 0701 - 01/15 0700 In: 1108.3 [I.V.:1108.3] Out: 2 [Urine:1; Stool:1]  PHYSICAL EXAM General appearance: alert, cooperative and mild distress Resp: rhonchi bilaterally Cardio: regular rate and rhythm, S1, S2 normal, no murmur, click, rub or gallop GI: soft, non-tender; bowel sounds normal; no masses,  no organomegaly Extremities: He has chronic venous stasis and chronic edema of both legs  Lab Results:    Basic Metabolic Panel:  Recent Labs  09/02/13 1843  NA 141  K 4.8  CL 107  CO2 20  GLUCOSE 180*  BUN 33*  CREATININE 2.66*  CALCIUM 8.6   Liver Function Tests:  Recent Labs  09/02/13 1843  AST 10  ALT <5  ALKPHOS 37*  BILITOT 0.2*  PROT 6.2  ALBUMIN 3.1*   No results found for this basename: LIPASE, AMYLASE,  in the last 72 hours No results found for this basename: AMMONIA,  in the last 72 hours CBC:  Recent Labs  09/02/13 1843  WBC 7.7  NEUTROABS 6.2  HGB 10.3*  HCT 32.3*  MCV 102.9*  PLT 159   Cardiac Enzymes: No results found for this basename: CKTOTAL, CKMB, CKMBINDEX, TROPONINI,  in the last 72 hours BNP: No results found for this basename: PROBNP,  in the last 72 hours D-Dimer: No results found for this basename: DDIMER,  in the last 72  hours CBG:  Recent Labs  09/02/13 2320 09/03/13 0713  GLUCAP 151* 121*   Hemoglobin A1C: No results found for this basename: HGBA1C,  in the last 72 hours Fasting Lipid Panel: No results found for this basename: CHOL, HDL, LDLCALC, TRIG, CHOLHDL, LDLDIRECT,  in the last 72 hours Thyroid Function Tests: No results found for this basename: TSH, T4TOTAL, FREET4, T3FREE, THYROIDAB,  in the last 72 hours Anemia Panel: No results found for this basename: VITAMINB12, FOLATE, FERRITIN, TIBC, IRON, RETICCTPCT,  in the last 72 hours Coagulation: No results found for this basename: LABPROT, INR,  in the last 72 hours Urine Drug Screen: Drugs of Abuse  No results found for this basename: labopia, cocainscrnur, labbenz, amphetmu, thcu, labbarb    Alcohol Level: No results found for this basename: ETH,  in the last 72 hours Urinalysis: No results found for this basename: COLORURINE, APPERANCEUR, LABSPEC, PHURINE, GLUCOSEU, HGBUR, BILIRUBINUR, KETONESUR, PROTEINUR, UROBILINOGEN, NITRITE, LEUKOCYTESUR,  in the last 72 hours Misc. Labs:  ABGS No results found for this basename: PHART, PCO2, PO2ART, TCO2, HCO3,  in the last 72 hours CULTURES No results found for this or any previous visit (from the past 240 hour(s)). Studies/Results: Ct Abdomen Pelvis Wo Contrast  09/03/2013   CLINICAL DATA:  Colitis.  EXAM: CT ABDOMEN AND PELVIS WITHOUT CONTRAST  TECHNIQUE: Multidetector CT imaging of the abdomen and pelvis was performed  following the standard protocol without intravenous contrast.  COMPARISON:  08/19/2013  FINDINGS: BODY WALL: Spigelian hernia on the right, containing small bowel loops (ileum). Related findings below. There is bilateral fatty inguinal hernias and a small supraumbilical midline abdominal wall hernia.  LOWER CHEST: Cardiomegaly.  ABDOMEN/PELVIS:  Liver: No focal abnormality.  Biliary: No evidence of biliary obstruction or stone.  Pancreas: Unremarkable.  Spleen: Unremarkable.   Adrenals: Unremarkable.  Kidneys and ureters: Bilateral renal atrophy.  No hydronephrosis.  Bladder: Unremarkable for degree of distention.  Reproductive: Unremarkable for age.  Bowel: As above, there is a right lower Spigelian hernia which contains ileal loops. The loops are distended, and only partially fill with contrast. The contrast show an abrupt contrast density difference at the level of hernia entry. There is new ascites around the distended and fluid-filled loops. The terminal ileum beyond the hernia is fluid-filled or thickened. here is still some fluid stool present within the colon. Distal colonic diverticulosis. Normal appendix.  Retroperitoneum: No mass or adenopathy.  Peritoneum: No pneumoperitoneum.  Vascular: No acute abnormality.  OSSEOUS: No acute abnormalities.  These results were called by telephone at the time of interpretation on 09/03/2013 at 2:32 AM to Dr. Sinda Du , who verbally acknowledged these results.  IMPRESSION: 1. Small bowel obstruction secondary to right-sided Spigelian hernia. No evidence of bowel necrosis or perforation, but is reactive mesenteric edema and ascites. 2. No evidence of colitis.   Electronically Signed   By: Jorje Guild M.D.   On: 09/03/2013 02:34   X-ray Chest Pa And Lateral   09/03/2013   CLINICAL DATA:  Colitis.  EXAM: CHEST  2 VIEW  COMPARISON:  08/25/2010  FINDINGS: Chronic cardiomegaly. No consolidation or edema. No effusion or pneumothorax. No acute osseous findings.  IMPRESSION: No active cardiopulmonary disease.   Electronically Signed   By: Jorje Guild M.D.   On: 09/03/2013 02:34    Medications:  Prior to Admission:  Prescriptions prior to admission  Medication Sig Dispense Refill  . aspirin 81 MG tablet Take 81 mg by mouth daily.        . rosiglitazone-metformin (AVANDAMET) 2-500 MG per tablet Take 1 tablet by mouth 2 (two) times daily with a meal.        . rosuvastatin (CRESTOR) 10 MG tablet Take 10 mg by mouth daily.          Scheduled: . ciprofloxacin  400 mg Intravenous Q12H  . enoxaparin (LOVENOX) injection  30 mg Subcutaneous Q24H  . insulin aspart  0-15 Units Subcutaneous TID WC  . metronidazole  500 mg Intravenous Q6H   Continuous: . sodium chloride 100 mL/hr at 09/02/13 1800   ZYS:AYTKZSWFUXNAT, acetaminophen, HYDROcodone-acetaminophen, HYDROmorphone (DILAUDID) injection, ondansetron (ZOFRAN) IV, ondansetron, traZODone  Assesment: He was admitted with abdominal discomfort and had previously had colitis. I thought he had another episode but it appears that he actually has a small bowel obstruction probably related to his large hernia. He has diabetes which is pretty well controlled. He has chronic anemia which may be related to his relatively mild chronic renal failure. I think he has been somewhat dehydrated. Active Problems:   Colitis    Plan: Discontinue IV antibiotics. Surgical consultation    LOS: 1 day   Tyneisha Hegeman L 09/03/2013, 8:32 AM

## 2013-09-03 NOTE — Progress Notes (Signed)
Utilization Review Complete  

## 2013-09-04 ENCOUNTER — Encounter (HOSPITAL_COMMUNITY): Payer: Self-pay

## 2013-09-04 DIAGNOSIS — E860 Dehydration: Secondary | ICD-10-CM | POA: Diagnosis present

## 2013-09-04 DIAGNOSIS — K56609 Unspecified intestinal obstruction, unspecified as to partial versus complete obstruction: Secondary | ICD-10-CM | POA: Diagnosis present

## 2013-09-04 LAB — BASIC METABOLIC PANEL
BUN: 30 mg/dL — AB (ref 6–23)
CALCIUM: 8.1 mg/dL — AB (ref 8.4–10.5)
CO2: 19 meq/L (ref 19–32)
Chloride: 111 mEq/L (ref 96–112)
Creatinine, Ser: 2.4 mg/dL — ABNORMAL HIGH (ref 0.50–1.35)
GFR calc Af Amer: 28 mL/min — ABNORMAL LOW (ref >90)
GFR, EST NON AFRICAN AMERICAN: 24 mL/min — AB (ref >90)
GLUCOSE: 106 mg/dL — AB (ref 70–99)
Potassium: 3.9 mEq/L (ref 3.7–5.3)
Sodium: 142 mEq/L (ref 137–147)

## 2013-09-04 LAB — GLUCOSE, CAPILLARY
Glucose-Capillary: 116 mg/dL — ABNORMAL HIGH (ref 70–99)
Glucose-Capillary: 112 mg/dL — ABNORMAL HIGH (ref 70–99)
Glucose-Capillary: 136 mg/dL — ABNORMAL HIGH (ref 70–99)
Glucose-Capillary: 89 mg/dL (ref 70–99)

## 2013-09-04 LAB — HEPATITIS C ANTIBODY: HCV Ab: NEGATIVE

## 2013-09-04 LAB — HEPATITIS B SURFACE ANTIGEN: Hepatitis B Surface Ag: NEGATIVE

## 2013-09-04 MED ORDER — ENOXAPARIN SODIUM 40 MG/0.4ML ~~LOC~~ SOLN
40.0000 mg | SUBCUTANEOUS | Status: DC
  Administered 2013-09-04 – 2013-09-07 (×4): 40 mg via SUBCUTANEOUS
  Filled 2013-09-04 (×4): qty 0.4

## 2013-09-04 NOTE — H&P (Signed)
NAMEHERMANN, DOTTAVIO NO.:  [id number]  MEDICAL RECORD NO.:  [id number]  LOCATION:  N562                          FACILITY:  [facility]  PHYSICIAN:  Jilliana Burkes L. Luan Pulling, M.D.DATE OF BIRTH:  [birth year]  DATE OF ADMISSION:  09/02/2013 DATE OF DISCHARGE:  LH                             HISTORY & PHYSICAL   REASON FOR ADMISSION:  Abdominal pain, nausea, vomiting, dehydration.  HISTORY:  Mr. Woessner is a 80 year old who I have seen in my office with colitis.  He was treated with antibiotics and improved markedly, but after he finished the antibiotics, he developed increasing problems with nausea, poor appetite, was not eating well.  He was seen in my office then and admitted to the hospital because of these problems.  He has not been eating or drinking well.  PAST MEDICAL HISTORY:  Positive for diabetes, anemia of chronic disease, renal insufficiency.  PAST SURGICAL HISTORY:  Surgically, he has had nasal polyps.  SOCIAL HISTORY:  He lives at home with his wife.  He does not use tobacco or alcohol.  REVIEW OF SYSTEMS:  Except as mentioned, he has not been eating.  He has had some abdominal pain.  He has not vomited significantly.  He has not had any diarrhea except he has some chronic fairly loose stools.  PHYSICAL EXAMINATION:  GENERAL:  Shows a well-developed, well-nourished male, who is in no acute distress. HEENT:  Pupils are reactive.  Nose and throat are clear.  Mucous membranes are dry. NECK:  Supple without masses. CHEST:  Relatively clear. HEART:  Regular. ABDOMEN:  Mildly diffusely tender.  He has a large right-sided hernia. EXTREMITIES:  No edema. CENTRAL NERVOUS SYSTEM:  Grossly intact.  ASSESSMENT:  He has abdominal pain and the concern is that he may have recurrent colitis.  PLAN:  Continue with his antibiotics.  He will have laboratory work, and a repeat CT of the abdomen.  He may need GI consultation depending on the results of the  above.     Arsema Tusing L. Luan Pulling, M.D.     ELH/MEDQ  D:  09/03/2013  T:  09/04/2013  Job:  [id number]

## 2013-09-04 NOTE — Consult Note (Addendum)
Reason for Consult: Acute kidney injury superimposed on chronic Referring Physician: Dr. Debbe Odea Greg [name] is an 80 y.o. male.  HPI: He is a patient who has long-standing history of diabetes, obesity presently came with abdominal pain and also some nausea. When he was evaluated patient was found to have partial small bowel obstruction. Presently patient denies any nausea or vomiting. He does have also any history of diarrhea. He was only has history of diabetes for the last 8 years patient denies any previous history of renal failure and a history of kidney stone. Patient also denies any leg edema. Presently he seems to be feeling okay.  Past Medical History  Diagnosis Date  . Diabetes mellitus   . Anemia of chronic disease   . Leukopenia     Intermittent  . Mild renal insufficiency   . Herpes zoster     in the past  . Obesity   . Exposure to hazardous substance   . Anemia of chronic disease 02/10/2011  . Intermittent Leukopenia 02/10/2011  . Renal insufficiency 02/26/2011    Past Surgical History  Procedure Laterality Date  . Nasal polyp surgery      History reviewed. No pertinent family history.  Social History:  reports that he has never smoked. He does not have any smokeless tobacco history on file. He reports that he does not use illicit drugs. His alcohol history is not on file.  Allergies:  Allergies  Allergen Reactions  . Celery Oil     vomiting    Medications: I have reviewed the patient's current medications.  Results for orders placed during the hospital encounter of 09/02/13 (from the past 48 hour(s))  COMPREHENSIVE METABOLIC PANEL     Status: Abnormal   Collection Time    09/02/13  6:43 PM      Result Value Range   Sodium 141  137 - 147 mEq/L   Potassium 4.8  3.7 - 5.3 mEq/L   Chloride 107  96 - 112 mEq/L   CO2 20  19 - 32 mEq/L   Glucose, Bld 180 (*) 70 - 99 mg/dL   BUN 33 (*) 6 - 23 mg/dL   Creatinine, Ser 2.66 (*) 0.50 - 1.35 mg/dL   Calcium 8.6   8.4 - 10.5 mg/dL   Total Protein 6.2  6.0 - 8.3 g/dL   Albumin 3.1 (*) 3.5 - 5.2 g/dL   AST 10  0 - 37 U/L   ALT <5  0 - 53 U/L   Alkaline Phosphatase 37 (*) 39 - 117 U/L   Total Bilirubin 0.2 (*) 0.3 - 1.2 mg/dL   GFR calc non Af Amer 21 (*) >90 mL/min   GFR calc Af Amer 25 (*) >90 mL/min   Comment: (NOTE)     The eGFR has been calculated using the CKD EPI equation.     This calculation has not been validated in all clinical situations.     eGFR's persistently <90 mL/min signify possible Chronic Kidney     Disease.  CBC WITH DIFFERENTIAL     Status: Abnormal   Collection Time    09/02/13  6:43 PM      Result Value Range   WBC 7.7  4.0 - 10.5 K/uL   RBC 3.14 (*) 4.22 - 5.81 MIL/uL   Hemoglobin 10.3 (*) 13.0 - 17.0 g/dL   HCT 32.3 (*) 39.0 - 52.0 %   MCV 102.9 (*) 78.0 - 100.0 fL   MCH 32.8  26.0 -  34.0 pg   MCHC 31.9  30.0 - 36.0 g/dL   RDW 14.7  11.5 - 15.5 %   Platelets 159  150 - 400 K/uL   Neutrophils Relative % 81 (*) 43 - 77 %   Neutro Abs 6.2  1.7 - 7.7 K/uL   Lymphocytes Relative 10 (*) 12 - 46 %   Lymphs Abs 0.8  0.7 - 4.0 K/uL   Monocytes Relative 8  3 - 12 %   Monocytes Absolute 0.6  0.1 - 1.0 K/uL   Eosinophils Relative 0  0 - 5 %   Eosinophils Absolute 0.0  0.0 - 0.7 K/uL   Basophils Relative 0  0 - 1 %   Basophils Absolute 0.0  0.0 - 0.1 K/uL  GLUCOSE, CAPILLARY     Status: Abnormal   Collection Time    09/02/13 11:20 PM      Result Value Range   Glucose-Capillary 151 (*) 70 - 99 mg/dL  GLUCOSE, CAPILLARY     Status: Abnormal   Collection Time    09/03/13  7:13 AM      Result Value Range   Glucose-Capillary 121 (*) 70 - 99 mg/dL  BASIC METABOLIC PANEL     Status: Abnormal   Collection Time    09/03/13  9:24 AM      Result Value Range   Sodium 142  137 - 147 mEq/L   Potassium 4.3  3.7 - 5.3 mEq/L   Chloride 109  96 - 112 mEq/L   CO2 20  19 - 32 mEq/L   Glucose, Bld 131 (*) 70 - 99 mg/dL   BUN 34 (*) 6 - 23 mg/dL   Creatinine, Ser 2.65 (*) 0.50 -  1.35 mg/dL   Calcium 8.2 (*) 8.4 - 10.5 mg/dL   GFR calc non Af Amer 21 (*) >90 mL/min   GFR calc Af Amer 25 (*) >90 mL/min   Comment: (NOTE)     The eGFR has been calculated using the CKD EPI equation.     This calculation has not been validated in all clinical situations.     eGFR's persistently <90 mL/min signify possible Chronic Kidney     Disease.  GLUCOSE, CAPILLARY     Status: Abnormal   Collection Time    09/03/13 11:23 AM      Result Value Range   Glucose-Capillary 101 (*) 70 - 99 mg/dL   Comment 1 Notify RN    CLOSTRIDIUM DIFFICILE BY PCR     Status: None   Collection Time    09/03/13  1:44 PM      Result Value Range   C difficile by pcr NEGATIVE  NEGATIVE  GLUCOSE, CAPILLARY     Status: Abnormal   Collection Time    09/03/13  5:05 PM      Result Value Range   Glucose-Capillary 119 (*) 70 - 99 mg/dL   Comment 1 Notify RN    GLUCOSE, CAPILLARY     Status: Abnormal   Collection Time    09/03/13  9:03 PM      Result Value Range   Glucose-Capillary 137 (*) 70 - 99 mg/dL  BASIC METABOLIC PANEL     Status: Abnormal   Collection Time    09/04/13  6:08 AM      Result Value Range   Sodium 142  137 - 147 mEq/L   Potassium 3.9  3.7 - 5.3 mEq/L   Chloride 111  96 - 112 mEq/L     CO2 19  19 - 32 mEq/L   Glucose, Bld 106 (*) 70 - 99 mg/dL   BUN 30 (*) 6 - 23 mg/dL   Creatinine, Ser 2.40 (*) 0.50 - 1.35 mg/dL   Calcium 8.1 (*) 8.4 - 10.5 mg/dL   GFR calc non Af Amer 24 (*) >90 mL/min   GFR calc Af Amer 28 (*) >90 mL/min   Comment: (NOTE)     The eGFR has been calculated using the CKD EPI equation.     This calculation has not been validated in all clinical situations.     eGFR's persistently <90 mL/min signify possible Chronic Kidney     Disease.  GLUCOSE, CAPILLARY     Status: Abnormal   Collection Time    09/04/13  8:05 AM      Result Value Range   Glucose-Capillary 116 (*) 70 - 99 mg/dL   Comment 1 Documented in Chart     Comment 2 Notify RN      Ct Abdomen  Pelvis Wo Contrast  09/03/2013   CLINICAL DATA:  Colitis.  EXAM: CT ABDOMEN AND PELVIS WITHOUT CONTRAST  TECHNIQUE: Multidetector CT imaging of the abdomen and pelvis was performed following the standard protocol without intravenous contrast.  COMPARISON:  08/19/2013  FINDINGS: BODY WALL: Spigelian hernia on the right, containing small bowel loops (ileum). Related findings below. There is bilateral fatty inguinal hernias and a small supraumbilical midline abdominal wall hernia.  LOWER CHEST: Cardiomegaly.  ABDOMEN/PELVIS:  Liver: No focal abnormality.  Biliary: No evidence of biliary obstruction or stone.  Pancreas: Unremarkable.  Spleen: Unremarkable.  Adrenals: Unremarkable.  Kidneys and ureters: Bilateral renal atrophy.  No hydronephrosis.  Bladder: Unremarkable for degree of distention.  Reproductive: Unremarkable for age.  Bowel: As above, there is a right lower Spigelian hernia which contains ileal loops. The loops are distended, and only partially fill with contrast. The contrast show an abrupt contrast density difference at the level of hernia entry. There is new ascites around the distended and fluid-filled loops. The terminal ileum beyond the hernia is fluid-filled or thickened. here is still some fluid stool present within the colon. Distal colonic diverticulosis. Normal appendix.  Retroperitoneum: No mass or adenopathy.  Peritoneum: No pneumoperitoneum.  Vascular: No acute abnormality.  OSSEOUS: No acute abnormalities.  These results were called by telephone at the time of interpretation on 09/03/2013 at 2:32 AM to Dr. EDWARD HAWKINS , who verbally acknowledged these results.  IMPRESSION: 1. Small bowel obstruction secondary to right-sided Spigelian hernia. No evidence of bowel necrosis or perforation, but is reactive mesenteric edema and ascites. 2. No evidence of colitis.   Electronically Signed   By: Jonathan  Watts M.D.   On: 09/03/2013 02:34   X-ray Chest Pa And Lateral   09/03/2013   CLINICAL  DATA:  Colitis.  EXAM: CHEST  2 VIEW  COMPARISON:  08/25/2010  FINDINGS: Chronic cardiomegaly. No consolidation or edema. No effusion or pneumothorax. No acute osseous findings.  IMPRESSION: No active cardiopulmonary disease.   Electronically Signed   By: Jonathan  Watts M.D.   On: 09/03/2013 02:34    Review of Systems  Constitutional: Negative for fever.  Respiratory: Negative for shortness of breath.   Cardiovascular: Negative for orthopnea.  Gastrointestinal: Positive for nausea and abdominal pain. Negative for vomiting.  Genitourinary: Negative for flank pain.   Blood pressure 189/82, pulse 65, temperature 97.6 F (36.4 C), temperature source Oral, resp. rate 20, height 5' 11" (1.803 m), weight 122.131 kg (269 lb   4 oz), SpO2 92.00%. Physical Exam  Constitutional: No distress.  Neck: No JVD present.  Cardiovascular: Normal rate and regular rhythm.   Respiratory: No respiratory distress. He has no wheezes.  GI: He exhibits distension. There is no tenderness. There is no rebound.  Musculoskeletal: He exhibits no edema.    Assessment/Plan: Problem #1 acute kidney injury: This is most likely secondary to prerenal syndrome versus ATN. Presently patient is none oliguric and his BUN and creatinine seems to be improving. Patient does not have any uremic sinus symptoms. Problem #2 chronic renal failure:. Looking at his chart patient seems to have long-standing history of renal failure. His creatinine on 02/28/2007 was 1.45 increased to 1.68 on 07/24/2010. The last blood work on 08/25/2010 was 1. 77 is GFR of 30 cc per minute. His CT scan presently showed bilateral renal atrophy consistent with chronic renal failure. This moment patient seems to have a stage III chronic renal failure. Etiology could be ischemic/diabetes/obesity related to Focal segmental nephrosclerosis. Problem #3 diabetes Problem #4 anemia: Most likely a compression of iron deficiency anemia as patient then ferritin and normal  iron saturation from previous studies. Since patient has stage III chronic renal failure at this moment superimposed anemia of chronic disease cannot ruled out. Problem #5 history of partial obstruction of small intestine Problem #6 obesity Problem #7 hypertension his blood pressure seems to be somewhat high. Problem #8 proteinuria:. UA from 2011 showed proteinuria of 30 mg/dL. Seems to be none nephrotic range most likely related to his diabetes. Plan: Agree with hydration and presently we'll increase his IV fluid to 125 cc per hour. We'll check 24-hour urine for protein and immunoelectrophoresis. We'll check iron studies, 25 vitamin D level, intact PTH and phosphorus in the morning. We'll check his basic metabolic panel in the morning. We'll check ANA, hepatitis B surface antigen, hepatitis C antibody and complement in the morning.  BEFEKADU,BELAYENH S 09/04/2013, 8:37 AM      

## 2013-09-04 NOTE — Progress Notes (Signed)
Subjective: He says he feels better. He has no new complaints. He says his breathing is okay and he is not having any abdominal pain.  Objective: Vital signs in last 24 hours: Temp:  [97.6 F (36.4 C)] 97.6 F (36.4 C) (01/16 0501) Pulse Rate:  [64-65] 65 (01/16 0501) Resp:  [20] 20 (01/16 0501) BP: (137-189)/(55-82) 189/82 mmHg (01/16 0501) SpO2:  [92 %-94 %] 92 % (01/16 0501) Weight:  [122.131 kg (269 lb 4 oz)] 122.131 kg (269 lb 4 oz) (01/16 0500) Weight change: 3.289 kg (7 lb 4 oz) Last BM Date: 09/03/13  Intake/Output from previous day: 01/15 0701 - 01/16 0700 In: 940 [P.O.:940] Out: -   PHYSICAL EXAM General appearance: alert, cooperative and no distress Resp: clear to auscultation bilaterally Cardio: regular rate and rhythm, S1, S2 normal, no murmur, click, rub or gallop GI: He has a hernia in the right flank area which is improved. Extremities: extremities normal, atraumatic, no cyanosis or edema  Lab Results:    Basic Metabolic Panel:  Recent Labs  09/03/13 0924 09/04/13 0608  NA 142 142  K 4.3 3.9  CL 109 111  CO2 20 19  GLUCOSE 131* 106*  BUN 34* 30*  CREATININE 2.65* 2.40*  CALCIUM 8.2* 8.1*   Liver Function Tests:  Recent Labs  09/02/13 1843  AST 10  ALT <5  ALKPHOS 37*  BILITOT 0.2*  PROT 6.2  ALBUMIN 3.1*   No results found for this basename: LIPASE, AMYLASE,  in the last 72 hours No results found for this basename: AMMONIA,  in the last 72 hours CBC:  Recent Labs  09/02/13 1843  WBC 7.7  NEUTROABS 6.2  HGB 10.3*  HCT 32.3*  MCV 102.9*  PLT 159   Cardiac Enzymes: No results found for this basename: CKTOTAL, CKMB, CKMBINDEX, TROPONINI,  in the last 72 hours BNP: No results found for this basename: PROBNP,  in the last 72 hours D-Dimer: No results found for this basename: DDIMER,  in the last 72 hours CBG:  Recent Labs  09/02/13 2320 09/03/13 0713 09/03/13 1123 09/03/13 1705 09/03/13 2103  GLUCAP 151* 121* 101* 119*  137*   Hemoglobin A1C: No results found for this basename: HGBA1C,  in the last 72 hours Fasting Lipid Panel: No results found for this basename: CHOL, HDL, LDLCALC, TRIG, CHOLHDL, LDLDIRECT,  in the last 72 hours Thyroid Function Tests: No results found for this basename: TSH, T4TOTAL, FREET4, T3FREE, THYROIDAB,  in the last 72 hours Anemia Panel: No results found for this basename: VITAMINB12, FOLATE, FERRITIN, TIBC, IRON, RETICCTPCT,  in the last 72 hours Coagulation: No results found for this basename: LABPROT, INR,  in the last 72 hours Urine Drug Screen: Drugs of Abuse  No results found for this basename: labopia, cocainscrnur, labbenz, amphetmu, thcu, labbarb    Alcohol Level: No results found for this basename: ETH,  in the last 72 hours Urinalysis: No results found for this basename: COLORURINE, APPERANCEUR, LABSPEC, PHURINE, GLUCOSEU, HGBUR, BILIRUBINUR, KETONESUR, PROTEINUR, UROBILINOGEN, NITRITE, LEUKOCYTESUR,  in the last 72 hours Misc. Labs:  ABGS No results found for this basename: PHART, PCO2, PO2ART, TCO2, HCO3,  in the last 72 hours CULTURES Recent Results (from the past 240 hour(s))  CLOSTRIDIUM DIFFICILE BY PCR     Status: None   Collection Time    09/03/13  1:44 PM      Result Value Range Status   C difficile by pcr NEGATIVE  NEGATIVE Final   Studies/Results: Ct Abdomen  Pelvis Wo Contrast  09/03/2013   CLINICAL DATA:  Colitis.  EXAM: CT ABDOMEN AND PELVIS WITHOUT CONTRAST  TECHNIQUE: Multidetector CT imaging of the abdomen and pelvis was performed following the standard protocol without intravenous contrast.  COMPARISON:  08/19/2013  FINDINGS: BODY WALL: Spigelian hernia on the right, containing small bowel loops (ileum). Related findings below. There is bilateral fatty inguinal hernias and a small supraumbilical midline abdominal wall hernia.  LOWER CHEST: Cardiomegaly.  ABDOMEN/PELVIS:  Liver: No focal abnormality.  Biliary: No evidence of biliary obstruction  or stone.  Pancreas: Unremarkable.  Spleen: Unremarkable.  Adrenals: Unremarkable.  Kidneys and ureters: Bilateral renal atrophy.  No hydronephrosis.  Bladder: Unremarkable for degree of distention.  Reproductive: Unremarkable for age.  Bowel: As above, there is a right lower Spigelian hernia which contains ileal loops. The loops are distended, and only partially fill with contrast. The contrast show an abrupt contrast density difference at the level of hernia entry. There is new ascites around the distended and fluid-filled loops. The terminal ileum beyond the hernia is fluid-filled or thickened. here is still some fluid stool present within the colon. Distal colonic diverticulosis. Normal appendix.  Retroperitoneum: No mass or adenopathy.  Peritoneum: No pneumoperitoneum.  Vascular: No acute abnormality.  OSSEOUS: No acute abnormalities.  These results were called by telephone at the time of interpretation on 09/03/2013 at 2:32 AM to Dr. Sinda Du , who verbally acknowledged these results.  IMPRESSION: 1. Small bowel obstruction secondary to right-sided Spigelian hernia. No evidence of bowel necrosis or perforation, but is reactive mesenteric edema and ascites. 2. No evidence of colitis.   Electronically Signed   By: Jorje Guild M.D.   On: 09/03/2013 02:34   X-ray Chest Pa And Lateral   09/03/2013   CLINICAL DATA:  Colitis.  EXAM: CHEST  2 VIEW  COMPARISON:  08/25/2010  FINDINGS: Chronic cardiomegaly. No consolidation or edema. No effusion or pneumothorax. No acute osseous findings.  IMPRESSION: No active cardiopulmonary disease.   Electronically Signed   By: Jorje Guild M.D.   On: 09/03/2013 02:34    Medications:  Prior to Admission:  Prescriptions prior to admission  Medication Sig Dispense Refill  . aspirin EC 81 MG tablet Take 81 mg by mouth every 3 (three) days.      . pioglitazone-metformin (ACTOPLUS MET) 15-500 MG per tablet Take 1 tablet by mouth 2 (two) times daily with a meal.       . rosuvastatin (CRESTOR) 10 MG tablet Take 10 mg by mouth daily.         Scheduled: . ciprofloxacin  400 mg Intravenous Q12H  . enoxaparin (LOVENOX) injection  30 mg Subcutaneous Q24H  . insulin aspart  0-15 Units Subcutaneous TID WC  . metronidazole  500 mg Intravenous Q6H   Continuous: . sodium chloride 100 mL/hr at 09/04/13 0207   MHD:[id number], acetaminophen, HYDROcodone-acetaminophen, HYDROmorphone (DILAUDID) injection, ondansetron (ZOFRAN) IV, ondansetron, traZODone  Assesment: He came in to the hospital with what was thought to be colitis. However his CT showed small bowel obstruction that was felt to be related to his hernia. He has a history of diabetes anemia of chronic disease and renal insufficiency. His renal function is not at baseline yet. I think he was dehydrated when he came to the hospital. He is still getting IV fluids but I think I will ask for nephrology consultation in anticipation of him having surgery for this hernia next week Active Problems:   Colitis    Plan: Continue  IV fluids continue other treatments asked for nephrology consultation and he is being set up for surgery next week    LOS: 2 days   Siya Flurry L 09/04/2013, 8:02 AM

## 2013-09-04 NOTE — H&P (Signed)
Greg [name] MRN: [id number] DOB/AGE: [birth year] 80 y.o. Primary Care Physician:Tania Perrott L, MD Admit date: 09/02/2013 Chief Complaint: Abdominal pain nausea vomiting and diarrhea HPI: This is a 80 year old who had been in his usual state of fair health until about 3 weeks ago. 3 weeks ago he developed abdominal discomfort and he was found to have a large hernia in his flank. He was also noted to have colitis at that time. He was treated conservatively with oral antibiotics and improved but almost immediately after his oral antibiotics were discontinued he developed more problems with abdominal pain nausea vomiting and diarrhea he came to my office and was found to be acutely ill and was admitted  Past Medical History  Diagnosis Date  . Diabetes mellitus   . Anemia of chronic disease   . Leukopenia     Intermittent  . Mild renal insufficiency   . Herpes zoster     in the past  . Obesity   . Exposure to hazardous substance   . Anemia of chronic disease 02/10/2011  . Intermittent Leukopenia 02/10/2011  . Renal insufficiency 02/26/2011   Past Surgical History  Procedure Laterality Date  . Nasal polyp surgery          History reviewed. No pertinent family history.  Social History:  reports that he has never smoked. He does not have any smokeless tobacco history on file. He reports that he does not use illicit drugs. His alcohol history is not on file.   Allergies:  Allergies  Allergen Reactions  . Celery Oil     vomiting    Medications Prior to Admission  Medication Sig Dispense Refill  . aspirin EC 81 MG tablet Take 81 mg by mouth every 3 (three) days.      . pioglitazone-metformin (ACTOPLUS MET) 15-500 MG per tablet Take 1 tablet by mouth 2 (two) times daily with a meal.      . rosuvastatin (CRESTOR) 10 MG tablet Take 10 mg by mouth daily.             XLK:GMWNU from the symptoms mentioned above,there are no other symptoms referable to all systems  reviewed.  Physical Exam: Blood pressure 189/82, pulse 65, temperature 97.6 F (36.4 C), temperature source Oral, resp. rate 20, height 5' 11"  (1.803 m), weight 122.131 kg (269 lb 4 oz), SpO2 92.00%. He is awake and alert. He is obese. His neck is supple. His mucous membranes are dry. Tympanic membranes are intact. His chest is clear. His heart is regular without gallop. His abdomen is soft and he does have a large hernia along the right side of his abdomen and flank. Bowel sounds are present and active. He is minimally tender in the abdomen. He has chronic venous stasis and edema of both legs. Central nervous system exam is grossly intact    Recent Labs  09/02/13 1843  WBC 7.7  NEUTROABS 6.2  HGB 10.3*  HCT 32.3*  MCV 102.9*  PLT 159    Recent Labs  09/03/13 0924 09/04/13 0608  NA 142 142  K 4.3 3.9  CL 109 111  CO2 20 19  GLUCOSE 131* 106*  BUN 34* 30*  CREATININE 2.65* 2.40*  CALCIUM 8.2* 8.1*  lablast2(ast:2,ALT:2,alkphos:2,bilitot:2,prot:2,albumin:2)@    Recent Results (from the past 240 hour(s))  CLOSTRIDIUM DIFFICILE BY PCR     Status: None   Collection Time    09/03/13  1:44 PM      Result Value Range Status   C difficile by  pcr NEGATIVE  NEGATIVE Final     Ct Abdomen Pelvis Wo Contrast  09/03/2013   CLINICAL DATA:  Colitis.  EXAM: CT ABDOMEN AND PELVIS WITHOUT CONTRAST  TECHNIQUE: Multidetector CT imaging of the abdomen and pelvis was performed following the standard protocol without intravenous contrast.  COMPARISON:  08/19/2013  FINDINGS: BODY WALL: Spigelian hernia on the right, containing small bowel loops (ileum). Related findings below. There is bilateral fatty inguinal hernias and a small supraumbilical midline abdominal wall hernia.  LOWER CHEST: Cardiomegaly.  ABDOMEN/PELVIS:  Liver: No focal abnormality.  Biliary: No evidence of biliary obstruction or stone.  Pancreas: Unremarkable.  Spleen: Unremarkable.  Adrenals: Unremarkable.  Kidneys and ureters:  Bilateral renal atrophy.  No hydronephrosis.  Bladder: Unremarkable for degree of distention.  Reproductive: Unremarkable for age.  Bowel: As above, there is a right lower Spigelian hernia which contains ileal loops. The loops are distended, and only partially fill with contrast. The contrast show an abrupt contrast density difference at the level of hernia entry. There is new ascites around the distended and fluid-filled loops. The terminal ileum beyond the hernia is fluid-filled or thickened. here is still some fluid stool present within the colon. Distal colonic diverticulosis. Normal appendix.  Retroperitoneum: No mass or adenopathy.  Peritoneum: No pneumoperitoneum.  Vascular: No acute abnormality.  OSSEOUS: No acute abnormalities.  These results were called by telephone at the time of interpretation on 09/03/2013 at 2:32 AM to Dr. Sinda Du , who verbally acknowledged these results.  IMPRESSION: 1. Small bowel obstruction secondary to right-sided Spigelian hernia. No evidence of bowel necrosis or perforation, but is reactive mesenteric edema and ascites. 2. No evidence of colitis.   Electronically Signed   By: Jorje Guild M.D.   On: 09/03/2013 02:34   Ct Abdomen Pelvis Wo Contrast  08/19/2013   ADDENDUM REPORT: 08/19/2013 09:29  ADDENDUM: Cause of the abnormal appearing cecum, terminal ileum and distal ileum is indeterminate. Enterocolitis may be infectious, inflammatory or ischemic in origin. Recommend followup until clearance to exclude underlying abnormality.   Electronically Signed   By: Chauncey Cruel M.D.   On: 08/19/2013 09:29   08/19/2013   CLINICAL DATA:  Low abdominal pain extending to right side for 3 - 4 weeks with 10 lb weight loss. Diabetic with mild renal insufficiency.  EXAM: CT ABDOMEN AND PELVIS WITHOUT CONTRAST  TECHNIQUE: Multidetector CT imaging of the abdomen and pelvis was performed following the standard protocol without intravenous contrast.  COMPARISON:  01/02/2007  abdominal CT.  07/22/2010 renal sonogram.  FINDINGS: Interval enlargement of right ventral lateral (Spigelian) hernia now large in size and containing small bowel, fat and vessels. The cecum, proximal ascending colon and terminal/distal ileum appear inflamed with inflamed appearing terminal and distal ileum contained within the right ventral lateral hernia.  Slightly featureless appearance of the rectal sigmoid colon may reflect changes of chronic colitis.  Diverticula distal transverse colon, left colon and sigmoid colon without extra luminal inflammatory process.  No free intraperitoneal air or localized fluid collection noted.  Small hiatal hernia. Under distended stomach with limited evaluation.  Contracted gallbladder without calcified gallstones.  Slightly lobulated appearance of the liver without other findings of cirrhosis. 5 mm low-density right lobe of liver unchanged.  Taking into account limitation by non contrast imaging, no worrisome hepatic, splenic, pancreatic, adrenal or renal lesion.  Left lung base parenchymal changes may represent atelectasis although subtle infiltrate not excluded. Malignancy might less likely consideration. Limited imaging through this region in 3 months  can be performed to confirm clearance.  Cardiomegaly.  Very mild coronary artery calcifications.  Atherosclerotic type changes of the aorta with ectasia with maximal transverse dimension of 2.6 cm. Atherosclerotic type changes iliac arteries with ectasia.  Hip joint degenerative changes bilaterally. Mild lumbar spine degenerative changes. No bony destructive lesion. Mild sacroiliac joint degenerative changes.  Under distended urinary bladder without gross abnormality.  No adenopathy.  IMPRESSION: Interval enlargement of right ventral lateral (Spigelian) hernia now large in size and containing small bowel, fat and vessels. The cecum, proximal ascending colon and terminal/distal ileum appear inflamed with inflamed appearing  terminal and distal ileum contained within the right ventral lateral hernia.  Please see above for additional findings.  These results will be called to the ordering clinician or representative by the Radiologist Assistant, and communication documented in the PACS Dashboard.  Electronically Signed: By: Chauncey Cruel M.D. On: 08/19/2013 09:10   X-ray Chest Pa And Lateral   09/03/2013   CLINICAL DATA:  Colitis.  EXAM: CHEST  2 VIEW  COMPARISON:  08/25/2010  FINDINGS: Chronic cardiomegaly. No consolidation or edema. No effusion or pneumothorax. No acute osseous findings.  IMPRESSION: No active cardiopulmonary disease.   Electronically Signed   By: Jorje Guild M.D.   On: 09/03/2013 02:34   Impression: I'm concerned that he probably has recurrent colitis. He may be having problems from his hernia. He has diabetes which is been exceptionally well controlled. He has mild to moderate chronic renal failure which has been stable but I think he is dehydrated now. Active Problems:   Colitis     Plan: Admit for IV fluids and start him on antibiotics and he will have a [place] Pager [phone]  09/04/2013, 8:23 AM

## 2013-09-04 NOTE — Progress Notes (Signed)
Subjective: Patient denies abdominal pain. Tolerating clear liquid diet well. Has had multiple bowel movements.  Objective: Vital signs in last 24 hours: Temp:  [97.6 F (36.4 C)] 97.6 F (36.4 C) (01/16 0501) Pulse Rate:  [64-65] 65 (01/16 0501) Resp:  [20] 20 (01/16 0501) BP: (137-189)/(55-82) 189/82 mmHg (01/16 0501) SpO2:  [92 %-94 %] 92 % (01/16 0501) Weight:  [122.131 kg (269 lb 4 oz)] 122.131 kg (269 lb 4 oz) (01/16 0500) Last BM Date: 09/04/13  Intake/Output from previous day: 01/15 0701 - 01/16 0700 In: 940 [P.O.:940] Out: -  Intake/Output this shift:    General appearance: alert, cooperative and no distress GI: Soft. No rigidity noted. No tenderness noted. Hernia still present but somewhat reducible.  Lab Results:   Recent Labs  09/02/13 1843  WBC 7.7  HGB 10.3*  HCT 32.3*  PLT 159   BMET  Recent Labs  09/03/13 0924 09/04/13 0608  NA 142 142  K 4.3 3.9  CL 109 111  CO2 20 19  GLUCOSE 131* 106*  BUN 34* 30*  CREATININE 2.65* 2.40*  CALCIUM 8.2* 8.1*   PT/INR No results found for this basename: LABPROT, INR,  in the last 72 hours  Studies/Results: Ct Abdomen Pelvis Wo Contrast  09/03/2013   CLINICAL DATA:  Colitis.  EXAM: CT ABDOMEN AND PELVIS WITHOUT CONTRAST  TECHNIQUE: Multidetector CT imaging of the abdomen and pelvis was performed following the standard protocol without intravenous contrast.  COMPARISON:  08/19/2013  FINDINGS: BODY WALL: Spigelian hernia on the right, containing small bowel loops (ileum). Related findings below. There is bilateral fatty inguinal hernias and a small supraumbilical midline abdominal wall hernia.  LOWER CHEST: Cardiomegaly.  ABDOMEN/PELVIS:  Liver: No focal abnormality.  Biliary: No evidence of biliary obstruction or stone.  Pancreas: Unremarkable.  Spleen: Unremarkable.  Adrenals: Unremarkable.  Kidneys and ureters: Bilateral renal atrophy.  No hydronephrosis.  Bladder: Unremarkable for degree of distention.   Reproductive: Unremarkable for age.  Bowel: As above, there is a right lower Spigelian hernia which contains ileal loops. The loops are distended, and only partially fill with contrast. The contrast show an abrupt contrast density difference at the level of hernia entry. There is new ascites around the distended and fluid-filled loops. The terminal ileum beyond the hernia is fluid-filled or thickened. here is still some fluid stool present within the colon. Distal colonic diverticulosis. Normal appendix.  Retroperitoneum: No mass or adenopathy.  Peritoneum: No pneumoperitoneum.  Vascular: No acute abnormality.  OSSEOUS: No acute abnormalities.  These results were called by telephone at the time of interpretation on 09/03/2013 at 2:32 AM to Dr. Sinda Du , who verbally acknowledged these results.  IMPRESSION: 1. Small bowel obstruction secondary to right-sided Spigelian hernia. No evidence of bowel necrosis or perforation, but is reactive mesenteric edema and ascites. 2. No evidence of colitis.   Electronically Signed   By: Jorje Guild M.D.   On: 09/03/2013 02:34   X-ray Chest Pa And Lateral   09/03/2013   CLINICAL DATA:  Colitis.  EXAM: CHEST  2 VIEW  COMPARISON:  08/25/2010  FINDINGS: Chronic cardiomegaly. No consolidation or edema. No effusion or pneumothorax. No acute osseous findings.  IMPRESSION: No active cardiopulmonary disease.   Electronically Signed   By: Jorje Guild M.D.   On: 09/03/2013 02:34    Anti-infectives: Anti-infectives   Start     Dose/Rate Route Frequency Ordered Stop   09/02/13 1800  metroNIDAZOLE (FLAGYL) IVPB 500 mg  Status:  Discontinued  500 mg 100 mL/hr over 60 Minutes Intravenous 4 times per day 09/02/13 1753 09/04/13 0805   09/02/13 1800  ciprofloxacin (CIPRO) IVPB 400 mg  Status:  Discontinued     400 mg 200 mL/hr over 60 Minutes Intravenous Every 12 hours 09/02/13 1753 09/04/13 0805      Assessment/Plan: Impression: Right spigelian hernia, renal  insufficiency Plan: Waiting for renal insufficiency to significantly improve prior to proceeding with ventral herniorrhaphy with mesh. Will advance diet.  LOS: 2 days    Gwenith Tschida A 09/04/2013

## 2013-09-05 LAB — CBC
HCT: 28.3 % — ABNORMAL LOW (ref 39.0–52.0)
HEMOGLOBIN: 9.4 g/dL — AB (ref 13.0–17.0)
MCH: 33.2 pg (ref 26.0–34.0)
MCHC: 33.2 g/dL (ref 30.0–36.0)
MCV: 100 fL (ref 78.0–100.0)
PLATELETS: 145 10*3/uL — AB (ref 150–400)
RBC: 2.83 MIL/uL — ABNORMAL LOW (ref 4.22–5.81)
RDW: 14.4 % (ref 11.5–15.5)
WBC: 4.1 10*3/uL (ref 4.0–10.5)

## 2013-09-05 LAB — BASIC METABOLIC PANEL
BUN: 24 mg/dL — AB (ref 6–23)
CALCIUM: 8.3 mg/dL — AB (ref 8.4–10.5)
CO2: 20 mEq/L (ref 19–32)
Chloride: 109 mEq/L (ref 96–112)
Creatinine, Ser: 2.15 mg/dL — ABNORMAL HIGH (ref 0.50–1.35)
GFR calc Af Amer: 32 mL/min — ABNORMAL LOW (ref >90)
GFR calc non Af Amer: 28 mL/min — ABNORMAL LOW (ref >90)
GLUCOSE: 97 mg/dL (ref 70–99)
Potassium: 4 mEq/L (ref 3.7–5.3)
Sodium: 140 mEq/L (ref 137–147)

## 2013-09-05 LAB — FERRITIN: Ferritin: 88 ng/mL (ref 22–322)

## 2013-09-05 LAB — IRON AND TIBC
Iron: 49 ug/dL (ref 42–135)
SATURATION RATIOS: 24 % (ref 20–55)
TIBC: 207 ug/dL — AB (ref 215–435)
UIBC: 158 ug/dL (ref 125–400)

## 2013-09-05 LAB — PHOSPHORUS: Phosphorus: 3.1 mg/dL (ref 2.3–4.6)

## 2013-09-05 LAB — GLUCOSE, CAPILLARY
Glucose-Capillary: 86 mg/dL (ref 70–99)
Glucose-Capillary: 128 mg/dL — ABNORMAL HIGH (ref 70–99)
Glucose-Capillary: 76 mg/dL (ref 70–99)

## 2013-09-05 LAB — C3 COMPLEMENT: C3 Complement: 86 mg/dL — ABNORMAL LOW (ref 90–180)

## 2013-09-05 LAB — ANTISTREPTOLYSIN O TITER: ASO: 58 [IU]/mL (ref <409)

## 2013-09-05 LAB — C4 COMPLEMENT: COMPLEMENT C4, BODY FLUID: 19 mg/dL (ref 10–40)

## 2013-09-05 NOTE — Progress Notes (Signed)
Subjective: Interval History: has no complaint of nausea or vomiting. Patient states that he's feeling much better. Presently he denies any difficulty increasing..  Objective: Vital signs in last 24 hours: Temp:  [97.8 F (36.6 C)-97.9 F (36.6 C)] 97.8 F (36.6 C) (01/17 0529) Pulse Rate:  [49-81] 49 (01/17 0529) Resp:  [20-22] 20 (01/17 0529) BP: (152-164)/(66-77) 159/77 mmHg (01/17 0529) SpO2:  [93 %-97 %] 95 % (01/17 0529) Weight change:   Intake/Output from previous day: 01/16 0701 - 01/17 0700 In: 5366.7 [P.O.:480; I.V.:4886.7] Out: -  Intake/Output this shift:    General appearance: alert, cooperative and no distress Resp: clear to auscultation bilaterally Cardio: regular rate and rhythm, S1, S2 normal, no murmur, click, rub or gallop GI: soft, non-tender; bowel sounds normal; no masses,  no organomegaly Extremities: extremities normal, atraumatic, no cyanosis or edema  Lab Results:  Recent Labs  09/02/13 1843 09/05/13 0536  WBC 7.7 4.1  HGB 10.3* 9.4*  HCT 32.3* 28.3*  PLT 159 145*   BMET:  Recent Labs  09/04/13 0608 09/05/13 0536  NA 142 140  K 3.9 4.0  CL 111 109  CO2 19 20  GLUCOSE 106* 97  BUN 30* 24*  CREATININE 2.40* 2.15*  CALCIUM 8.1* 8.3*   No results found for this basename: PTH,  in the last 72 hours Iron Studies: No results found for this basename: IRON, TIBC, TRANSFERRIN, FERRITIN,  in the last 72 hours  Studies/Results: No results found.  I have reviewed the patient's current medications.  Assessment/Plan: Problem #1 acute kidney injury his BUN and creatinine is 24 and 2.15 her function is improving. His potassium is also normal. Problem #2 chronic renal failure Problem #3 diabetes Problem #4 hypertension: His blood pressure seems to be reasonably controlled Problem #5 history of small bowel obstruction: Presently he denies any pain and also seems to tolerate feeding. Problem #6 anemia: His hemoglobin hematocrit is declining.  Iron studies presently pending. Problem #7 proteinuria and none nephrotic range. Problem #8 obesity Problem #9 metabolic bone disease: His calcium and phosphorus is with in acceptable range. Plan: We'll continue his present management.    LOS: 3 days   Ayaana Biondo S 09/05/2013,9:21 AM

## 2013-09-05 NOTE — Progress Notes (Signed)
NAMEMAKEL, MCMANN NO.:  [id number]  MEDICAL RECORD NO.:  [id number]  LOCATION:  D322                          FACILITY:  [facility]  PHYSICIAN:  Paula Compton. Willey Blade, MD       DATE OF BIRTH:  06-Feb-1932  DATE OF PROCEDURE: DATE OF DISCHARGE:                                PROGRESS NOTE   HISTORY OF PRESENT ILLNESS:  Greg [name] is awake and alert this morning. He denies abdominal pain or vomiting.  His bowels have moved.  He has a good appetite.  PHYSICAL EXAMINATION:  VITAL SIGNS:  Temperature 97.8, pulse has varied from 49-81, respirations 20, blood pressure 159/77, oxygen saturation 95%. LUNGS:  Clear. HEART:  Regular with no murmurs. ABDOMEN:  Nontender with no palpable organomegaly.  He has a large right- sided abdominal hernia.  IMPRESSION/PLAN: 1. Right spigelian hernia.  Plan is for Dr. Arnoldo Morale to repair this on     Monday. 2. Small-bowel obstruction, resolved. 3. Chronic kidney disease, stage III.  BUN and creatinine are     improving with hydration.  BUN and creatinine today are down to 24     and 2.15.  Serum potassium is normal at 4.0 and sodium is normal at     140.  He has an anemia with a hemoglobin of 9.4 and a mild     thrombocytopenia at 145,000. 4. Diabetes.  Glucose is 86 this morning by Accu-Chek.     Paula Compton. Willey Blade, MD     ROF/MEDQ  D:  09/05/2013  T:  09/05/2013  Job:  [id number]

## 2013-09-05 NOTE — Progress Notes (Signed)
  Subjective: No nausea or vomiting. Patient has had well-formed bowel movements. Denies any abdominal pain.  Objective: Vital signs in last 24 hours: Temp:  [97.8 F (36.6 C)-97.9 F (36.6 C)] 97.8 F (36.6 C) (01/17 0529) Pulse Rate:  [49-81] 49 (01/17 0529) Resp:  [20-22] 20 (01/17 0529) BP: (152-164)/(66-77) 159/77 mmHg (01/17 0529) SpO2:  [93 %-97 %] 95 % (01/17 0529) Last BM Date: 09/05/13  Intake/Output from previous day: 01/16 0701 - 01/17 0700 In: 5366.7 [P.O.:480; I.V.:4886.7] Out: -  Intake/Output this shift:    General appearance: alert, cooperative and no distress GI: Soft. Right ventral hernia unchanged. No distention noted.  Lab Results:   Recent Labs  09/02/13 1843 09/05/13 0536  WBC 7.7 4.1  HGB 10.3* 9.4*  HCT 32.3* 28.3*  PLT 159 145*   BMET  Recent Labs  09/04/13 0608 09/05/13 0536  NA 142 140  K 3.9 4.0  CL 111 109  CO2 19 20  GLUCOSE 106* 97  BUN 30* 24*  CREATININE 2.40* 2.15*  CALCIUM 8.1* 8.3*   PT/INR No results found for this basename: LABPROT, INR,  in the last 72 hours  Studies/Results: No results found.  Anti-infectives: Anti-infectives   Start     Dose/Rate Route Frequency Ordered Stop   09/02/13 1800  metroNIDAZOLE (FLAGYL) IVPB 500 mg  Status:  Discontinued     500 mg 100 mL/hr over 60 Minutes Intravenous 4 times per day 09/02/13 1753 09/04/13 0805   09/02/13 1800  ciprofloxacin (CIPRO) IVPB 400 mg  Status:  Discontinued     400 mg 200 mL/hr over 60 Minutes Intravenous Every 12 hours 09/02/13 1753 09/04/13 0805      Assessment/Plan: Impression: Right spigelian hernia, stable Renal insufficiency, resolving Plan: We'll continue to monitor renal insufficiency. Have discussed with the patient as to whether he wants the surgery done during this hospitalization for as an outpatient. He will think about it and will recheck his renal function in a.m.  LOS: 3 days    Kasara Schomer A 09/05/2013

## 2013-09-05 NOTE — Progress Notes (Signed)
Patient with abnormal EKG. Dr. Arnoldo Morale notified. New orders for cardiac monitoring.

## 2013-09-06 LAB — GLUCOSE, CAPILLARY
GLUCOSE-CAPILLARY: 95 mg/dL (ref 70–99)
Glucose-Capillary: 105 mg/dL — ABNORMAL HIGH (ref 70–99)
Glucose-Capillary: 114 mg/dL — ABNORMAL HIGH (ref 70–99)
Glucose-Capillary: 125 mg/dL — ABNORMAL HIGH (ref 70–99)

## 2013-09-06 LAB — CBC
HCT: 29.1 % — ABNORMAL LOW (ref 39.0–52.0)
HEMOGLOBIN: 9.6 g/dL — AB (ref 13.0–17.0)
MCH: 33 pg (ref 26.0–34.0)
MCHC: 33 g/dL (ref 30.0–36.0)
MCV: 100 fL (ref 78.0–100.0)
Platelets: 157 10*3/uL (ref 150–400)
RBC: 2.91 MIL/uL — AB (ref 4.22–5.81)
RDW: 14.4 % (ref 11.5–15.5)
WBC: 3.7 10*3/uL — AB (ref 4.0–10.5)

## 2013-09-06 LAB — BASIC METABOLIC PANEL
BUN: 20 mg/dL (ref 6–23)
CO2: 20 mEq/L (ref 19–32)
Calcium: 8.2 mg/dL — ABNORMAL LOW (ref 8.4–10.5)
Chloride: 112 mEq/L (ref 96–112)
Creatinine, Ser: 2.03 mg/dL — ABNORMAL HIGH (ref 0.50–1.35)
GFR calc Af Amer: 34 mL/min — ABNORMAL LOW (ref >90)
GFR, EST NON AFRICAN AMERICAN: 29 mL/min — AB (ref >90)
GLUCOSE: 101 mg/dL — AB (ref 70–99)
POTASSIUM: 4 meq/L (ref 3.7–5.3)
SODIUM: 143 meq/L (ref 137–147)

## 2013-09-06 LAB — ABO/RH: ABO/RH(D): A POS

## 2013-09-06 LAB — PREPARE RBC (CROSSMATCH)

## 2013-09-06 LAB — SURGICAL PCR SCREEN
MRSA, PCR: NEGATIVE
STAPHYLOCOCCUS AUREUS: NEGATIVE

## 2013-09-06 MED ORDER — CEFAZOLIN SODIUM 10 G IJ SOLR: 3.0000 g | INTRAMUSCULAR | Status: DC

## 2013-09-06 MED ORDER — CHLORHEXIDINE GLUCONATE 4 % EX LIQD
1.0000 "application " | Freq: Once | CUTANEOUS | Status: AC
  Administered 2013-09-06: 1 via TOPICAL
  Filled 2013-09-06: qty 15

## 2013-09-06 NOTE — Progress Notes (Signed)
NAMEJASRAJ, LAPPE NO.:  [id number]  MEDICAL RECORD NO.:  [id number]  LOCATION:  F810                          FACILITY:  [facility]  PHYSICIAN:  Paula Compton. Willey Blade, MD       DATE OF BIRTH:  15-Aug-1932  DATE OF PROCEDURE: DATE OF DISCHARGE:                                PROGRESS NOTE   SUBJECTIVE:  Greg [name] is comfortable this morning.  He denies any abdominal pain, distention, nausea, vomiting, or fever.  He is able to eat and drink.  He had 2 recorded bowel movements yesterday and 1 today.  OBJECTIVE:  VITAL SIGNS:  Temperature 98.2, pulse 70, blood pressure 136/66, oxygen saturation 95%. LUNGS:  Clear. HEART:  Irregular. ABDOMEN:  Nontender with no organomegaly.  Right-sided hernia is stable. EXTREMITIES:  Reveal no edema.  IMPRESSION/PLAN: 1. Right spigelian hernia.  He appears to have no absolute medical     contraindications to surgical repair.  He does have an abnormal     EKG.  He is in normal sinus rhythm, but has PVCs with a first     degree AV block and a left bundle-branch block.  He is not on a     beta-blocker.  His case has been discussed with Dr. Arnoldo Morale. 2. Small bowel obstruction, resolved. 3. Acute on chronic kidney failure.  BUN and creatinine are improving.     His BUN and creatinine today are 20 and 2.03 with a serum potassium     of 4.0.  His case has been discussed with Dr. Lowanda Foster. 4. Normocytic anemia.  Stable.  Hemoglobin is 9.6 with an MCV of 100. 5. Diabetes.  Fasting glucose is 95.     Paula Compton. Willey Blade, MD     ROF/MEDQ  D:  09/06/2013  T:  09/06/2013  Job:  [id number]

## 2013-09-06 NOTE — Progress Notes (Signed)
  Subjective: Patient denies any abdominal pain. Appetite still decreased. He is having bowel movements. No emesis.  Objective: Vital signs in last 24 hours: Temp:  [98 F (36.7 C)-98.2 F (36.8 C)] 98.2 F (36.8 C) (01/18 0459) Pulse Rate:  [55-91] 70 (01/18 0459) Resp:  [20] 20 (01/17 2220) BP: (127-159)/(53-99) 136/66 mmHg (01/18 0459) SpO2:  [94 %-95 %] 95 % (01/18 0459) Weight:  [122.2 kg (269 lb 6.4 oz)] 122.2 kg (269 lb 6.4 oz) (01/18 0459) Last BM Date: 09/06/13  Intake/Output from previous day: 01/17 0701 - 01/18 0700 In: 3068.3 [P.O.:720; I.V.:2348.3] Out: 850 [Urine:850] Intake/Output this shift: Total I/O In: 120 [P.O.:120] Out: 500 [Urine:500]  General appearance: alert, cooperative and no distress GI: Soft. Ventral hernia still present, nontender.  Lab Results:   Recent Labs  09/05/13 0536 09/06/13 0539  WBC 4.1 3.7*  HGB 9.4* 9.6*  HCT 28.3* 29.1*  PLT 145* 157   BMET  Recent Labs  09/05/13 0536 09/06/13 0539  NA 140 143  K 4.0 4.0  CL 109 112  CO2 20 20  GLUCOSE 97 101*  BUN 24* 20  CREATININE 2.15* 2.03*  CALCIUM 8.3* 8.2*   PT/INR No results found for this basename: LABPROT, INR,  in the last 72 hours  Studies/Results: No results found.  Anti-infectives: Anti-infectives   Start     Dose/Rate Route Frequency Ordered Stop   09/02/13 1800  metroNIDAZOLE (FLAGYL) IVPB 500 mg  Status:  Discontinued     500 mg 100 mL/hr over 60 Minutes Intravenous 4 times per day 09/02/13 1753 09/04/13 0805   09/02/13 1800  ciprofloxacin (CIPRO) IVPB 400 mg  Status:  Discontinued     400 mg 200 mL/hr over 60 Minutes Intravenous Every 12 hours 09/02/13 1753 09/04/13 0805      Assessment/Plan: Impression: Renal insufficiency has significantly improved. 12-lead EKG does show bundle-branch block with occasional PVC. Will discuss this with Dr. Luan Pulling. Will type and cross for 2 units packed red blood cells. Have temporarily placed patient on the OR  schedule for tomorrow. The risks and benefits of the procedure including bleeding, infection, and a possibly recurrence of the hernia were fully explained to the patient, who gave informed consent. The patient may require postoperative care in the ICU.  LOS: 4 days    Greg [name] A 09/06/2013

## 2013-09-06 NOTE — Progress Notes (Signed)
Subjective: Interval History: has no complaint of nausea or vomiting. Patient states that he's feeling much better. Presently he denies any difficulty increasing..  Objective: Vital signs in last 24 hours: Temp:  [98 F (36.7 C)-98.2 F (36.8 C)] 98.2 F (36.8 C) (01/18 0459) Pulse Rate:  [55-91] 70 (01/18 0459) Resp:  [20] 20 (01/17 2220) BP: (127-159)/(53-99) 136/66 mmHg (01/18 0459) SpO2:  [94 %-95 %] 95 % (01/18 0459) Weight:  [122.2 kg (269 lb 6.4 oz)] 122.2 kg (269 lb 6.4 oz) (01/18 0459) Weight change:   Intake/Output from previous day: 01/17 0701 - 01/18 0700 In: 3068.3 [P.O.:720; I.V.:2348.3] Out: 850 [Urine:850] Intake/Output this shift: Total I/O In: -  Out: 500 [Urine:500]  General appearance: alert, cooperative and no distress Resp: clear to auscultation bilaterally Cardio: regular rate and rhythm, S1, S2 normal, no murmur, click, rub or gallop GI: soft, non-tender; bowel sounds normal; no masses,  no organomegaly Extremities: extremities normal, atraumatic, no cyanosis or edema  Lab Results:  Recent Labs  09/05/13 0536 09/06/13 0539  WBC 4.1 3.7*  HGB 9.4* 9.6*  HCT 28.3* 29.1*  PLT 145* 157   BMET:   Recent Labs  09/05/13 0536 09/06/13 0539  NA 140 143  K 4.0 4.0  CL 109 112  CO2 20 20  GLUCOSE 97 101*  BUN 24* 20  CREATININE 2.15* 2.03*  CALCIUM 8.3* 8.2*   No results found for this basename: PTH,  in the last 72 hours Iron Studies:   Recent Labs  09/05/13 0535  IRON 49  TIBC 207*  FERRITIN 88    Studies/Results: No results found.  I have reviewed the patient's current medications.  Assessment/Plan: Problem #1 acute kidney injury his BUN and creatinine is 20 and 2.03  His renal  function is improving. His potassium is also normal. Problem #2 chronic renal failure Problem #3 diabetes Problem #4 hypertension: His blood pressure seems to be reasonably controlled Problem #5 history of small bowel obstruction: Presently he  denies any pain and also seems to tolerate feeding. Problem #6 anemia: His hemoglobin hematocrit is low but stable. His iron saturation is good but feratin is low possibly a combination of iron deficiency and anemia of chronic diseases Problem #7 proteinuria and none nephrotic range. Problem #8 obesity Problem #9 metabolic bone disease: His calcium and phosphorus is with in acceptable range. Plan: We'll continue his present management. Basic metabolic panel in am    LOS: 4 days   [name] S 09/06/2013,9:45 AM

## 2013-09-07 ENCOUNTER — Encounter (HOSPITAL_COMMUNITY)
Admission: AD | Disposition: A | Discharge status: Home / Self Care | Payer: Self-pay | Source: Ambulatory Visit | Attending: Pulmonary Disease

## 2013-09-07 ENCOUNTER — Encounter (HOSPITAL_COMMUNITY): Payer: Self-pay | Admitting: Adult Health

## 2013-09-07 DIAGNOSIS — N189 Chronic kidney disease, unspecified: Secondary | ICD-10-CM

## 2013-09-07 DIAGNOSIS — Z01810 Encounter for preprocedural cardiovascular examination: Secondary | ICD-10-CM

## 2013-09-07 DIAGNOSIS — R9431 Abnormal electrocardiogram [ECG] [EKG]: Secondary | ICD-10-CM

## 2013-09-07 LAB — BASIC METABOLIC PANEL
BUN: 16 mg/dL (ref 6–23)
CO2: 20 mEq/L (ref 19–32)
CREATININE: 1.79 mg/dL — AB (ref 0.50–1.35)
Calcium: 8.2 mg/dL — ABNORMAL LOW (ref 8.4–10.5)
Chloride: 116 mEq/L — ABNORMAL HIGH (ref 96–112)
GFR calc Af Amer: 40 mL/min — ABNORMAL LOW (ref >90)
GFR, EST NON AFRICAN AMERICAN: 34 mL/min — AB (ref >90)
GLUCOSE: 106 mg/dL — AB (ref 70–99)
POTASSIUM: 3.8 meq/L (ref 3.7–5.3)
Sodium: 147 mEq/L (ref 137–147)

## 2013-09-07 LAB — CBC
HCT: 27.8 % — ABNORMAL LOW (ref 39.0–52.0)
HEMOGLOBIN: 9.2 g/dL — AB (ref 13.0–17.0)
MCH: 33 pg (ref 26.0–34.0)
MCHC: 33.1 g/dL (ref 30.0–36.0)
MCV: 99.6 fL (ref 78.0–100.0)
Platelets: 144 10*3/uL — ABNORMAL LOW (ref 150–400)
RBC: 2.79 MIL/uL — ABNORMAL LOW (ref 4.22–5.81)
RDW: 14.4 % (ref 11.5–15.5)
WBC: 3.4 10*3/uL — ABNORMAL LOW (ref 4.0–10.5)

## 2013-09-07 LAB — ANA: Anti Nuclear Antibody(ANA): NEGATIVE

## 2013-09-07 LAB — PTH, INTACT AND CALCIUM
CALCIUM TOTAL (PTH): 8 mg/dL — AB (ref 8.4–10.5)
PTH: 186.4 pg/mL — ABNORMAL HIGH (ref 14.0–72.0)

## 2013-09-07 LAB — GLUCOSE, CAPILLARY
GLUCOSE-CAPILLARY: 105 mg/dL — AB (ref 70–99)
Glucose-Capillary: 92 mg/dL (ref 70–99)
Glucose-Capillary: 160 mg/dL — ABNORMAL HIGH (ref 70–99)

## 2013-09-07 SURGERY — REPAIR, HERNIA, VENTRAL
Anesthesia: General

## 2013-09-07 NOTE — Progress Notes (Signed)
Surgery postponed until cardiac clearance has been obtained. Patient and wife understand and agree to treatment plan.

## 2013-09-07 NOTE — Progress Notes (Signed)
Subjective: Interval History: Denies any abdominal pain. Patient states that he's feeling much better. Presently no vomiting.  Objective: Vital signs in last 24 hours: Temp:  [97.5 F (36.4 C)-98 F (36.7 C)] 97.5 F (36.4 C) (01/19 0526) Pulse Rate:  [46-75] 75 (01/19 0526) Resp:  [19-20] 19 (01/19 0526) BP: (141-152)/(48-93) 141/48 mmHg (01/19 0526) SpO2:  [97 %-98 %] 97 % (01/19 0526) Weight:  [124.24 kg (273 lb 14.4 oz)] 124.24 kg (273 lb 14.4 oz) (01/19 0526) Weight change: 2.04 kg (4 lb 8 oz)  Intake/Output from previous day: 01/18 0701 - 01/19 0700 In: 2741.7 [P.O.:360; I.V.:2381.7] Out: 1750 [Urine:1750] Intake/Output this shift:    General appearance: alert, cooperative and no distress Resp: clear to auscultation bilaterally Cardio: regular rate and rhythm, S1, S2 normal, no murmur, click, rub or gallop GI: soft, non-tender; bowel sounds normal; no masses,  no organomegaly Extremities: extremities normal, atraumatic, no cyanosis or edema  Lab Results:  Recent Labs  09/06/13 0539 09/07/13 0524  WBC 3.7* 3.4*  HGB 9.6* 9.2*  HCT 29.1* 27.8*  PLT 157 144*   BMET:   Recent Labs  09/06/13 0539 09/07/13 0524  NA 143 147  K 4.0 3.8  CL 112 116*  CO2 20 20  GLUCOSE 101* 106*  BUN 20 16  CREATININE 2.03* 1.79*  CALCIUM 8.2* 8.2*   No results found for this basename: PTH,  in the last 72 hours Iron Studies:   Recent Labs  09/05/13 0535  IRON 49  TIBC 207*  FERRITIN 88    Studies/Results: No results found.  I have reviewed the patient's current medications.  Assessment/Plan: Problem #1 acute kidney injury his BUN and creatinine is 16 and 1.79  His renal  function is improving. It is returning to his base line.  His potassium is also normal. Problem #2 chronic renal failure Problem #3 diabetes Problem #4 hypertension: His blood pressure seems to be reasonably controlled Problem #5 history of small bowel obstruction:  Problem #6 anemia: His  hemoglobin hematocrit is low  And declining Problem #7 proteinuria and none nephrotic range. Problem #8 obesity Problem #9 metabolic bone disease: His calcium and phosphorus is with in acceptable range. Plan: We'll continue his present management. Basic metabolic panel in am    LOS: 5 days   Haris Baack S 09/07/2013,7:25 AM

## 2013-09-07 NOTE — Consult Note (Addendum)
CARDIOLOGY CONSULT NOTE   Patient ID: Greg [name] MRN: [id number] DOB/AGE: Nov 06, 1931 80 y.o.  Admit Date: 09/02/2013 Referring Physician: Sinda Du MD Primary Physician: [name], MD Consulting Cardiologist: Carlyle Dolly MD Primary Cardiologist: New Reason for Consultation: Abnormal EKG-Pre-Op evaluation.   Clinical Summary Greg [name] is a 80 y.o.male admitted with abdominal pain, with recent treatment for colitis, and SBO. He was also found to have acute kidney injury with known history of CKD Stage III. He is being followed by nephrology. Marland Kitchen He was found to have right spigelian hernia on CT scan on 09/02/2013,  with planned surgical repair. He was noted to have abnormal EKG with 1st Degree AV block and new LBBB with PVC.s We are asked for pre-operative evaluation.      He has not prior history of cardiac disease or prior testing. He denies chest pain, DOE, dizziness or fatigue. He is currently pain free. He is eating, drinking and having BM's. States he has chronic LEE.       Allergies  Allergen Reactions  . Celery Oil     vomiting    Medications Scheduled Medications: . enoxaparin (LOVENOX) injection  40 mg Subcutaneous Q24H  . insulin aspart  0-15 Units Subcutaneous TID WC    Infusions: . sodium chloride 100 mL/hr at 09/07/13 0619    PRN Medications: acetaminophen, acetaminophen, HYDROcodone-acetaminophen, HYDROmorphone (DILAUDID) injection, ondansetron (ZOFRAN) IV, ondansetron, traZODone   Past Medical History  Diagnosis Date  . Diabetes mellitus   . Anemia of chronic disease   . Leukopenia     Intermittent  . Mild renal insufficiency   . Herpes zoster     in the past  . Obesity   . Exposure to hazardous substance   . Anemia of chronic disease 02/10/2011  . Intermittent Leukopenia 02/10/2011  . Renal insufficiency 02/26/2011    Past Surgical History  Procedure Laterality Date  . Nasal polyp surgery      Family History  Problem  Relation Age of Onset  . Heart attack Brother     CABG  . Diabetes Brother   . Stroke Father   . Heart Problems Mother     Social History Greg [name] reports that he has never smoked. His smokeless tobacco use includes Chew. Greg [name] has no alcohol history on file.  Review of Systems Otherwise reviewed and negative except as outlined.  Physical Examination Blood pressure 141/48, pulse 75, temperature 97.5 F (36.4 C), temperature source Oral, resp. rate 19, height 5\' 11"  (1.803 m), weight 273 lb 14.4 oz (124.24 kg), SpO2 97.00%.  Intake/Output Summary (Last 24 hours) at 09/07/13 1004 Last data filed at 09/07/13 0827  Gross per 24 hour  Intake 2621.66 ml  Output   1450 ml  Net 1171.66 ml    HEENT: Conjunctiva and lids normal, oropharynx clear with moist mucosa.Obese. Neck: Supple, no elevated JVP or carotid bruits, no thyromegaly. Lungs: Clear to auscultation, nonlabored breathing at rest. Cardiac: Regular rate and rhythm, no S3 or significant systolic murmur, no pericardial rub. Abdomen: Soft, nontender, no hepatomegaly, bowel sounds present, no guarding or rebound. Extremities: No pitting edema, distal pulses 2+. Skin: Warm and dry. Musculoskeletal: No kyphosis. Neuropsychiatric: Alert and oriented x3, affect grossly appropriate.  Prior Cardiac Testing/Procedures None Lab Results  Basic Metabolic Panel:  Recent Labs Lab 09/03/13 0924 09/04/13 0608 09/05/13 0536 09/06/13 0539 09/07/13 0524  NA 142 142 140 143 147  K 4.3 3.9 4.0 4.0 3.8  CL 109 111 109  112 116*  CO2 20 19 20 20 20   GLUCOSE 131* 106* 97 101* 106*  BUN 34* 30* 24* 20 16  CREATININE 2.65* 2.40* 2.15* 2.03* 1.79*  CALCIUM 8.2* 8.1* 8.3* 8.2* 8.2*  PHOS  --   --  3.1  --   --     Liver Function Tests:  Recent Labs Lab 09/02/13 1843  AST 10  ALT <5  ALKPHOS 37*  BILITOT 0.2*  PROT 6.2  ALBUMIN 3.1*    CBC:  Recent Labs Lab 09/02/13 1843 09/05/13 0536 09/06/13 0539  09/07/13 0524  WBC 7.7 4.1 3.7* 3.4*  NEUTROABS 6.2  --   --   --   HGB 10.3* 9.4* 9.6* 9.2*  HCT 32.3* 28.3* 29.1* 27.8*  MCV 102.9* 100.0 100.0 99.6  PLT 159 145* 157 144*    Radiology: CT Scan: Abdomen IMPRESSION: 1. Small bowel obstruction secondary to right-sided Spigelian hernia. No evidence of bowel necrosis or perforation, but is reactive mesenteric edema and ascites. 2. No evidence of colitis.   ECG:NSR with 1st degree AV block. LBBB and frequent PVC's.   Impression and Recommendations  1. Ventricular ectopy: Patient without cardiac history but CVRF of diabetes, and FH, with ongoing tobacco use (chew), age. He is asymptomatic. Will plan echocardiogram for LV fx. Consider NM stress test for evaluation of ischemia.  Once testing is completed will make recommendations to release for surgery.   2. Diabetes; Blood glucose is being managed by Dr. Luan Pulling.  3. CKD stage III: BUN and Creatinine initially elevated at 2.66. Now improved with hydration 1.79. This will be monitored for further decision for invasive cardiac testing should this be necessary based on echo results and NM study if planned.    Signed: [name]. Lawrence NP [facility] 09/07/2013, 10:04 AM Co-Sign MD 80 yo male with hx of DM, CKD, and no cardiac history admitted with abdominal pain, found to have partial small bowel obstruction. He is being considered for surgery for hernia repair per notes. Cardiology consulted for abnormal EKG preopatively. EKG shows bigeminy, first degree AV block, and non-specific conduction delay (I have repeated the EKG, the available 12 lead does not show the typical QRS and ST/T discordance usually seen with LBBB). Patient denies any cardiac history or cardiac symptoms. From a preop standpoint he has no active acute cardiac conditions. I am not able to assess his cardiopulmonary status accurately by history, he lives a very sedentary lifestyle and exertion is more limited by  chronic leg weakness. Will obtain 2D echo today to evaluate for structural heart disease in the setting of these electrical abnormalities, if normal LV function would lean towards progressing with surgery. Non-invasive ischemic evaluation may help risk stratify him however would not change his management in my point of view and likely just prolong his needed surgery. Revascularization prior to non-cardiac surgery has never been shown to affect cardiovacular outcomes.   Carlyle Dolly MD

## 2013-09-07 NOTE — Progress Notes (Signed)
Subjective: He feels well and has no complaints. Surgery had been scheduled for today but because he has new abnormalities on his EKG that has been postponed  Objective: Vital signs in last 24 hours: Temp:  [97.5 F (36.4 C)-98 F (36.7 C)] 97.5 F (36.4 C) (01/19 0526) Pulse Rate:  [46-75] 75 (01/19 0526) Resp:  [19-20] 19 (01/19 0526) BP: (141-152)/(48-93) 141/48 mmHg (01/19 0526) SpO2:  [97 %-98 %] 97 % (01/19 0526) Weight:  [124.24 kg (273 lb 14.4 oz)] 124.24 kg (273 lb 14.4 oz) (01/19 0526) Weight change: 2.04 kg (4 lb 8 oz) Last BM Date: 09/07/13  Intake/Output from previous day: 01/18 0701 - 01/19 0700 In: 2741.7 [P.O.:360; I.V.:2381.7] Out: 1750 [Urine:1750]  PHYSICAL EXAM General appearance: alert, cooperative and no distress Resp: clear to auscultation bilaterally Cardio: regular rate and rhythm, S1, S2 normal, no murmur, click, rub or gallop GI: His abdomen is soft. The hernia does not seem to be as full of abdominal contents as earlier in the week. Bowel sounds are present and active Extremities: He has chronic venous stasis  Lab Results:    Basic Metabolic Panel:  Recent Labs  09/05/13 0536 09/06/13 0539 09/07/13 0524  NA 140 143 147  K 4.0 4.0 3.8  CL 109 112 116*  CO2 20 20 20   GLUCOSE 97 101* 106*  BUN 24* 20 16  CREATININE 2.15* 2.03* 1.79*  CALCIUM 8.3* 8.2* 8.2*  PHOS 3.1  --   --    Liver Function Tests: No results found for this basename: AST, ALT, ALKPHOS, BILITOT, PROT, ALBUMIN,  in the last 72 hours No results found for this basename: LIPASE, AMYLASE,  in the last 72 hours No results found for this basename: AMMONIA,  in the last 72 hours CBC:  Recent Labs  09/06/13 0539 09/07/13 0524  WBC 3.7* 3.4*  HGB 9.6* 9.2*  HCT 29.1* 27.8*  MCV 100.0 99.6  PLT 157 144*   Cardiac Enzymes: No results found for this basename: CKTOTAL, CKMB, CKMBINDEX, TROPONINI,  in the last 72 hours BNP: No results found for this basename: PROBNP,  in  the last 72 hours D-Dimer: No results found for this basename: DDIMER,  in the last 72 hours CBG:  Recent Labs  09/05/13 1613 09/06/13 0800 09/06/13 1209 09/06/13 1719 09/06/13 2039 09/07/13 0741  GLUCAP 76 95 105* 114* 125* 92   Hemoglobin A1C: No results found for this basename: HGBA1C,  in the last 72 hours Fasting Lipid Panel: No results found for this basename: CHOL, HDL, LDLCALC, TRIG, CHOLHDL, LDLDIRECT,  in the last 72 hours Thyroid Function Tests: No results found for this basename: TSH, T4TOTAL, FREET4, T3FREE, THYROIDAB,  in the last 72 hours Anemia Panel:  Recent Labs  09/05/13 0535  FERRITIN 88  TIBC 207*  IRON 49   Coagulation: No results found for this basename: LABPROT, INR,  in the last 72 hours Urine Drug Screen: Drugs of Abuse  No results found for this basename: labopia, cocainscrnur, labbenz, amphetmu, thcu, labbarb    Alcohol Level: No results found for this basename: ETH,  in the last 72 hours Urinalysis: No results found for this basename: COLORURINE, APPERANCEUR, LABSPEC, PHURINE, GLUCOSEU, HGBUR, BILIRUBINUR, KETONESUR, PROTEINUR, UROBILINOGEN, NITRITE, LEUKOCYTESUR,  in the last 72 hours Misc. Labs:  ABGS No results found for this basename: PHART, PCO2, PO2ART, TCO2, HCO3,  in the last 72 hours CULTURES Recent Results (from the past 240 hour(s))  CLOSTRIDIUM DIFFICILE BY PCR     Status:  None   Collection Time    09/03/13  1:44 PM      Result Value Range Status   C difficile by pcr NEGATIVE  NEGATIVE Final  SURGICAL PCR SCREEN     Status: None   Collection Time    09/06/13  3:36 PM      Result Value Range Status   MRSA, PCR NEGATIVE  NEGATIVE Final   Staphylococcus aureus NEGATIVE  NEGATIVE Final   Comment:            The Xpert SA Assay (FDA     approved for NASAL specimens     in patients over 13 years of age),     is one component of     a comprehensive surveillance     program.  Test performance has     been validated by  [employer] for patients greater     than or equal to 50 year old.     It is not intended     to diagnose infection nor to     guide or monitor treatment.   Studies/Results: No results found.  Medications:  Prior to Admission:  Prescriptions prior to admission  Medication Sig Dispense Refill  . aspirin EC 81 MG tablet Take 81 mg by mouth every 3 (three) days.      . pioglitazone-metformin (ACTOPLUS MET) 15-500 MG per tablet Take 1 tablet by mouth 2 (two) times daily with a meal.      . rosuvastatin (CRESTOR) 10 MG tablet Take 10 mg by mouth daily.         Scheduled: . enoxaparin (LOVENOX) injection  40 mg Subcutaneous Q24H  . insulin aspart  0-15 Units Subcutaneous TID WC   Continuous: . sodium chloride 100 mL/hr at 09/07/13 7026   VZC:HYIFOYDXAJOIN, acetaminophen, HYDROcodone-acetaminophen, HYDROmorphone (DILAUDID) injection, ondansetron (ZOFRAN) IV, ondansetron, traZODone  Assesment: He was admitted with small bowel obstruction that has improved. He has acute on chronic renal insufficiency and that has improved. He was dehydrated on admission and that has improved. He had colitis prior to admission and that has resolved. He has new changes on his EKG so his surgery has been postponed and cardiology consultation will be requested Principal Problem:   Small bowel obstruction Active Problems:   Renal insufficiency   Colitis   Dehydration    Plan: Cardiology consult. Postpone surgery    LOS: 5 days   Amahia Madonia L 09/07/2013, 8:33 AM

## 2013-09-08 DIAGNOSIS — I517 Cardiomegaly: Secondary | ICD-10-CM

## 2013-09-08 LAB — GLUCOSE, CAPILLARY
Glucose-Capillary: 98 mg/dL (ref 70–99)
Glucose-Capillary: 121 mg/dL — ABNORMAL HIGH (ref 70–99)

## 2013-09-08 LAB — UIFE/LIGHT CHAINS/TP QN, 24-HR UR
Albumin, U: DETECTED
Alpha 1, Urine: DETECTED — AB
Alpha 2, Urine: DETECTED — AB
Beta, Urine: DETECTED — AB
FREE KAPPA LT CHAINS, UR: 28.7 mg/dL — AB (ref 0.14–2.42)
Free Kappa/Lambda Ratio: 9.83 ratio (ref 2.04–10.37)
Free Lambda Excretion/Day: 30.66 mg/d
Free Lambda Lt Chains,Ur: 2.92 mg/dL — ABNORMAL HIGH (ref 0.02–0.67)
Free Lt Chn Excr Rate: 301.35 mg/d
GAMMA UR: DETECTED — AB
TIME-UPE24: 24 h
Total Protein, Urine-Ur/day: 404 mg/d — ABNORMAL HIGH (ref 10–140)
Total Protein, Urine: 38.5 mg/dL
Volume, Urine: 1050 mL

## 2013-09-08 LAB — BASIC METABOLIC PANEL
BUN: 12 mg/dL (ref 6–23)
CALCIUM: 8.3 mg/dL — AB (ref 8.4–10.5)
CO2: 22 mEq/L (ref 19–32)
CREATININE: 1.7 mg/dL — AB (ref 0.50–1.35)
Chloride: 114 mEq/L — ABNORMAL HIGH (ref 96–112)
GFR calc non Af Amer: 37 mL/min — ABNORMAL LOW (ref >90)
GFR, EST AFRICAN AMERICAN: 42 mL/min — AB (ref >90)
Glucose, Bld: 111 mg/dL — ABNORMAL HIGH (ref 70–99)
Potassium: 4.2 mEq/L (ref 3.7–5.3)
Sodium: 146 mEq/L (ref 137–147)

## 2013-09-08 MED ORDER — CARVEDILOL 3.125 MG PO TABS: 3.1250 mg | ORAL_TABLET | Freq: Two times a day (BID) | ORAL | Status: DC

## 2013-09-08 NOTE — Progress Notes (Signed)
      Subjective:  No cardiac complaints. Awaiting hernia repair. Echo just complete.  Objective:  Vital Signs in the last 24 hours: Temp:  [97.6 F (36.4 C)-98.1 F (36.7 C)] 98.1 F (36.7 C) (01/20 0618) Pulse Rate:  [42-83] 78 (01/20 0816) Resp:  [18-20] 20 (01/20 0618) BP: (144-164)/(47-113) 164/91 mmHg (01/20 0816) SpO2:  [98 %-99 %] 99 % (01/20 0618)  Intake/Output from previous day: 01/19 0701 - 01/20 0700 In: 1586.7 [P.O.:220; I.V.:1366.7] Out: 1400 [Urine:1400] Intake/Output from this shift: Total I/O In: -  Out: 500 [Urine:500]  Physical Exam: NECK: Without JVD, HJR, or bruit LUNGS: Decreased breath sounds throughout. HEART: Regular rate and rhythm with skipping, distant heart sounds, no murmur, gallop, rub, bruit, thrill, or heave EXTREMITIES: Without cyanosis, clubbing, or edema  Lab Results:  Recent Labs  09/06/13 0539 09/07/13 0524  WBC 3.7* 3.4*  HGB 9.6* 9.2*  PLT 157 144*    Recent Labs  09/07/13 0524 09/08/13 0602  NA 147 146  K 3.8 4.2  CL 116* 114*  CO2 20 22  GLUCOSE 106* 111*  BUN 16 12  CREATININE 1.79* 1.70*    Assessment/Plan:   1. SBO, awaiting hernia repair  2. New LBBB & ventricular ectopy:awaiting 2Decho results. No cardiac symptoms but very sedentary. If normal study can proceed with surgery.  3. Acute on Chronic kidney disease Stage III: creatinine 1.70  4. DM   LOS: 6 days    Ermalinda Barrios 09/08/2013, 9:31 AM   Attending note:  Reviewed recent consultation by Dr. Harl Bowie. Discussed the case with Ms. Bonnell Public PA-C. Will complete echocardiogram today. If LVEF is normal, no further cardiac testing planned at this time, and can proceed with surgery.  Satira Sark, M.D., F.A.C.C.

## 2013-09-08 NOTE — Progress Notes (Signed)
Pt discharged home today per Dr. Luan Pulling. Pt's IV site D/C'd and WNL. Pt's VS stable at this time. Pt and pt's wife provided with home medication list, discharge instructions and prescriptions. Pt and wife verbalized understanding. Pt's wife stated she spoke with Dr. Arnoldo Morale regarding F/U surgery regarding ventral hernia. Dr. Arnoldo Morale to call patient this weekend with further instructions. Pt left floor via WC in stable condition accompanied by NT and wife.

## 2013-09-08 NOTE — Progress Notes (Signed)
    Followup note. Please see echocardiogram report. Patient has LVEF 40-45% in setting of left bundle branch block, does not exclude underlying ischemic heart disease. He has not had recent angina or CHF symptoms however, and at this point would recommend medical therapy and proceeding with surgery. Certainly, followup noninvasive imaging via Cardiolite would be reasonable for further risk stratification, but would not hold up surgery for this. He is on Coreg for now. We can assist with further medication adjustments along the way.   Satira Sark, M.D., F.A.C.C.

## 2013-09-08 NOTE — Progress Notes (Signed)
Subjective: He feels ok. No abdominal pain or nausea or  Vomiting. He's not having any diarrhea  Objective: Vital signs in last 24 hours: Temp:  [97.6 F (36.4 C)-98.1 F (36.7 C)] 98.1 F (36.7 C) (01/20 0618) Pulse Rate:  [42-83] 83 (01/20 0618) Resp:  [18-20] 20 (01/20 0618) BP: (144-150)/(47-113) 150/113 mmHg (01/20 0618) SpO2:  [98 %-99 %] 99 % (01/20 0618) Weight change:  Last BM Date: 09/07/13  Intake/Output from previous day: 01/19 0701 - 01/20 0700 In: 1586.7 [P.O.:220; I.V.:1366.7] Out: 1400 [Urine:1400]  PHYSICAL EXAM General appearance: alert, cooperative and no distress Resp: clear to auscultation bilaterally Cardio: regular rate and rhythm, S1, S2 normal, no murmur, click, rub or gallop GI: he has a large hernia that is soft and non tender Extremities: chronic venous stasis and edema  Lab Results:    Basic Metabolic Panel:  Recent Labs  09/07/13 0524 09/08/13 0602  NA 147 146  K 3.8 4.2  CL 116* 114*  CO2 20 22  GLUCOSE 106* 111*  BUN 16 12  CREATININE 1.79* 1.70*  CALCIUM 8.2* 8.3*   Liver Function Tests: No results found for this basename: AST, ALT, ALKPHOS, BILITOT, PROT, ALBUMIN,  in the last 72 hours No results found for this basename: LIPASE, AMYLASE,  in the last 72 hours No results found for this basename: AMMONIA,  in the last 72 hours CBC:  Recent Labs  09/06/13 0539 09/07/13 0524  WBC 3.7* 3.4*  HGB 9.6* 9.2*  HCT 29.1* 27.8*  MCV 100.0 99.6  PLT 157 144*   Cardiac Enzymes: No results found for this basename: CKTOTAL, CKMB, CKMBINDEX, TROPONINI,  in the last 72 hours BNP: No results found for this basename: PROBNP,  in the last 72 hours D-Dimer: No results found for this basename: DDIMER,  in the last 72 hours CBG:  Recent Labs  09/06/13 1209 09/06/13 1719 09/06/13 2039 09/07/13 0741 09/07/13 1105 09/07/13 1705  GLUCAP 105* 114* 125* 92 160* 105*   Hemoglobin A1C: No results found for this basename: HGBA1C,  in  the last 72 hours Fasting Lipid Panel: No results found for this basename: CHOL, HDL, LDLCALC, TRIG, CHOLHDL, LDLDIRECT,  in the last 72 hours Thyroid Function Tests: No results found for this basename: TSH, T4TOTAL, FREET4, T3FREE, THYROIDAB,  in the last 72 hours Anemia Panel: No results found for this basename: VITAMINB12, FOLATE, FERRITIN, TIBC, IRON, RETICCTPCT,  in the last 72 hours Coagulation: No results found for this basename: LABPROT, INR,  in the last 72 hours Urine Drug Screen: Drugs of Abuse  No results found for this basename: labopia, cocainscrnur, labbenz, amphetmu, thcu, labbarb    Alcohol Level: No results found for this basename: ETH,  in the last 72 hours Urinalysis: No results found for this basename: COLORURINE, APPERANCEUR, LABSPEC, PHURINE, GLUCOSEU, HGBUR, BILIRUBINUR, KETONESUR, PROTEINUR, UROBILINOGEN, NITRITE, LEUKOCYTESUR,  in the last 72 hours Misc. Labs:  ABGS No results found for this basename: PHART, PCO2, PO2ART, TCO2, HCO3,  in the last 72 hours CULTURES Recent Results (from the past 240 hour(s))  CLOSTRIDIUM DIFFICILE BY PCR     Status: None   Collection Time    09/03/13  1:44 PM      Result Value Range Status   C difficile by pcr NEGATIVE  NEGATIVE Final  SURGICAL PCR SCREEN     Status: None   Collection Time    09/06/13  3:36 PM      Result Value Range Status   MRSA, PCR  NEGATIVE  NEGATIVE Final   Staphylococcus aureus NEGATIVE  NEGATIVE Final   Comment:            The Xpert SA Assay (FDA     approved for NASAL specimens     in patients over 6 years of age),     is one component of     a comprehensive surveillance     program.  Test performance has     been validated by [employer] for patients greater     than or equal to 53 year old.     It is not intended     to diagnose infection nor to     guide or monitor treatment.   Studies/Results: No results found.  Medications:  Prior to Admission:  Prescriptions prior to  admission  Medication Sig Dispense Refill  . aspirin EC 81 MG tablet Take 81 mg by mouth every 3 (three) days.      . pioglitazone-metformin (ACTOPLUS MET) 15-500 MG per tablet Take 1 tablet by mouth 2 (two) times daily with a meal.      . rosuvastatin (CRESTOR) 10 MG tablet Take 10 mg by mouth daily.         Scheduled: . enoxaparin (LOVENOX) injection  40 mg Subcutaneous Q24H  . insulin aspart  0-15 Units Subcutaneous TID WC   Continuous: . sodium chloride 100 mL/hr at 09/08/13 8478   SXQ:[id number], acetaminophen, HYDROcodone-acetaminophen, HYDROmorphone (DILAUDID) injection, ondansetron (ZOFRAN) IV, ondansetron, traZODone  Assesment: He has a large hernia and had a partial small bowel obstruction from that. He has an abnormal electrocardiogram with bundle branch block and PVCs and is set to undergo echocardiogram to help a surgical clearance. He has chronic kidney disease which has improved. He has diabetes which is been under excellent control at home Principal Problem:   Small bowel obstruction Active Problems:   Renal insufficiency   Colitis   Dehydration   Abnormal EKG    Plan: After he has his echocardiogram assuming that his left ventricular function is reasonable I think he could be discharged home to plan his surgery as an outpatient    LOS: 6 days   Greg [name] L 09/08/2013, 8:12 AM

## 2013-09-08 NOTE — Progress Notes (Signed)
*  PRELIMINARY RESULTS* Echocardiogram 2D Echocardiogram has been performed.  [place], [place] 09/08/2013, 10:14 AM

## 2013-09-08 NOTE — Progress Notes (Signed)
DAO MEMMOTT  MRN: [id number]  DOB/AGE: 23-Jun-1932 80 y.o.  Primary Care Physician:HAWKINS,EDWARD L, MD  Admit date: 09/02/2013  Chief Complaint: No chief complaint on file.   S-Pt presented on  09/02/2013 with No chief complaint on file. .    Pt today feels better.    Pt offers no new complaints.  Meds . carvedilol  3.125 mg Oral BID WC  . enoxaparin (LOVENOX) injection  40 mg Subcutaneous Q24H  . insulin aspart  0-15 Units Subcutaneous TID WC       Physical Exam: Vital signs in last 24 hours: Temp:  [97.6 F (36.4 C)-98.1 F (36.7 C)] 98.1 F (36.7 C) (01/20 0618) Pulse Rate:  [42-83] 78 (01/20 0816) Resp:  [18-20] 20 (01/20 0618) BP: (144-164)/(47-113) 164/91 mmHg (01/20 0816) SpO2:  [98 %-99 %] 99 % (01/20 0618) Weight change:  Last BM Date: 09/07/13  Intake/Output from previous day: 01/19 0701 - 01/20 0700 In: 1586.7 [P.O.:220; I.V.:1366.7] Out: 1400 [Urine:1400] Total I/O In: -  Out: 500 [Urine:500]   Physical Exam: General- pt is awake,alert, oriented to time place and person Resp- No acute REsp distress, CTA B/L NO Rhonchi CVS- S1S2 regular in rate and rhythm GIT- BS+, soft, NT. EXT- NO LE Edema, Cyanosis   Lab Results: CBC  Recent Labs  09/06/13 0539 09/07/13 0524  WBC 3.7* 3.4*  HGB 9.6* 9.2*  HCT 29.1* 27.8*  PLT 157 144*    BMET  Recent Labs  09/07/13 0524 09/08/13 0602  NA 147 146  K 3.8 4.2  CL 116* 114*  CO2 20 22  GLUCOSE 106* 111*  BUN 16 12  CREATININE 1.79* 1.70*  CALCIUM 8.2* 8.3*   Creat Trend 2015  2.66=>1.79=>1.70 2012  1.77 2011  1.66--2.3 2009  1.6 2008   1.45  MICRO Recent Results (from the past 240 hour(s))  CLOSTRIDIUM DIFFICILE BY PCR     Status: None   Collection Time    09/03/13  1:44 PM      Result Value Range Status   C difficile by pcr NEGATIVE  NEGATIVE Final  SURGICAL PCR SCREEN     Status: None   Collection Time    09/06/13  3:36 PM      Result Value Range Status   MRSA, PCR  NEGATIVE  NEGATIVE Final   Staphylococcus aureus NEGATIVE  NEGATIVE Final   Comment:            The Xpert SA Assay (FDA     approved for NASAL specimens     in patients over 78 years of age),     is one component of     a comprehensive surveillance     program.  Test performance has     been validated by [employer] for patients greater     than or equal to 25 year old.     It is not intended     to diagnose infection nor to     guide or monitor treatment.      Lab Results  Component Value Date   PTH 186.4* 09/04/2013   CALCIUM 8.3* 09/08/2013   PHOS 3.1 09/05/2013         Impression: 1)Renal  AKI secondary to Prerenal/ATN                AKI on CKD                AKI now better  Creat near to baseline               CKD stage 3.               CKD since 2008               CKD secondary to DM/ Age asso decline                Progression of CKD marked with AKI                Proteinura  Present- quantification pending                   2)HTN   3)Anemia HGb at goal (9--11)   4)CKD Mineral-Bone Disorder PTH elevated. Secondary Hyperparathyroidism  Present. Phosphorus at goal.   5)GI- admitted with SBO PMD following  6)Electrolytes Normokalemic NOrmonatremic   7)Acid base Co2 at goal     Plan:  Will continue current care.     Marlina Cataldi S 09/08/2013, 9:18 AM

## 2013-09-08 NOTE — Care Management Note (Signed)
    Page 1 of 1   09/08/2013     1:36:04 PM   CARE MANAGEMENT NOTE 09/08/2013  Patient:  Greg [name], Greg [name]   Account Number:  [id number]  Date Initiated:  09/08/2013  Documentation initiated by:  Claretha Cooper  Subjective/Objective Assessment:   Pt from home with wife. Planning to DC today. No HH needs identified. Pt could use an oversized BSC     Action/Plan:   Anticipated DC Date:  09/08/2013   Anticipated DC Plan:  [place]  CM consult      Choice offered to / List presented to:     DME arranged  [place]      DME agency  [facility].        Status of service:  Completed, signed off Medicare Important Message given?   (If response is "NO", the following Medicare IM given date fields will be blank) Date Medicare IM given:   Date Additional Medicare IM given:    Discharge Disposition:  HOME/SELF CARE  Per UR Regulation:    If discussed at Long Length of Stay Meetings, dates discussed:    Comments:  09/08/13 Claretha Cooper RN [facility] CM Oversized BSC to be shipped to pt's home. [place] notified.

## 2013-09-09 LAB — COMPLEMENT, TOTAL: Compl, Total (CH50): 56 U/mL (ref 31–60)

## 2013-09-09 NOTE — Discharge Summary (Signed)
Physician Discharge Summary  Patient ID: Greg [name] MRN: [id number] DOB/AGE: [birth year] 80 y.o. Primary Care Physician:Aloha Bartok L, MD Admit date: 09/02/2013 Discharge date: 09/09/2013    Discharge Diagnoses:   Principal Problem:   Small bowel obstruction Active Problems:   Renal insufficiency   Colitis   Dehydration   Abnormal EKG  spigelian hernia hypertension   Medication List         aspirin EC 81 MG tablet  Take 81 mg by mouth every 3 (three) days.     carvedilol 3.125 MG tablet  Commonly known as:  COREG  Take 1 tablet (3.125 mg total) by mouth 2 (two) times daily with a meal.     pioglitazone-metformin 15-500 MG per tablet  Commonly known as:  ACTOPLUS MET  Take 1 tablet by mouth 2 (two) times daily with a meal.     rosuvastatin 10 MG tablet  Commonly known as:  CRESTOR  Take 10 mg by mouth daily.        Discharged Condition: Improved    Consults: General surgery/cardiology  Significant Diagnostic Studies: Ct Abdomen Pelvis Wo Contrast  09/03/2013   CLINICAL DATA:  Colitis.  EXAM: CT ABDOMEN AND PELVIS WITHOUT CONTRAST  TECHNIQUE: Multidetector CT imaging of the abdomen and pelvis was performed following the standard protocol without intravenous contrast.  COMPARISON:  08/19/2013  FINDINGS: BODY WALL: Spigelian hernia on the right, containing small bowel loops (ileum). Related findings below. There is bilateral fatty inguinal hernias and a small supraumbilical midline abdominal wall hernia.  LOWER CHEST: Cardiomegaly.  ABDOMEN/PELVIS:  Liver: No focal abnormality.  Biliary: No evidence of biliary obstruction or stone.  Pancreas: Unremarkable.  Spleen: Unremarkable.  Adrenals: Unremarkable.  Kidneys and ureters: Bilateral renal atrophy.  No hydronephrosis.  Bladder: Unremarkable for degree of distention.  Reproductive: Unremarkable for age.  Bowel: As above, there is a right lower Spigelian hernia which contains ileal loops. The loops are distended, and  only partially fill with contrast. The contrast show an abrupt contrast density difference at the level of hernia entry. There is new ascites around the distended and fluid-filled loops. The terminal ileum beyond the hernia is fluid-filled or thickened. here is still some fluid stool present within the colon. Distal colonic diverticulosis. Normal appendix.  Retroperitoneum: No mass or adenopathy.  Peritoneum: No pneumoperitoneum.  Vascular: No acute abnormality.  OSSEOUS: No acute abnormalities.  These results were called by telephone at the time of interpretation on 09/03/2013 at 2:32 AM to Dr. Sinda Du , who verbally acknowledged these results.  IMPRESSION: 1. Small bowel obstruction secondary to right-sided Spigelian hernia. No evidence of bowel necrosis or perforation, but is reactive mesenteric edema and ascites. 2. No evidence of colitis.   Electronically Signed   By: Jorje Guild M.D.   On: 09/03/2013 02:34   Ct Abdomen Pelvis Wo Contrast  08/19/2013   ADDENDUM REPORT: 08/19/2013 09:29  ADDENDUM: Cause of the abnormal appearing cecum, terminal ileum and distal ileum is indeterminate. Enterocolitis may be infectious, inflammatory or ischemic in origin. Recommend followup until clearance to exclude underlying abnormality.   Electronically Signed   By: Chauncey Cruel M.D.   On: 08/19/2013 09:29   08/19/2013   CLINICAL DATA:  Low abdominal pain extending to right side for 3 - 4 weeks with 10 lb weight loss. Diabetic with mild renal insufficiency.  EXAM: CT ABDOMEN AND PELVIS WITHOUT CONTRAST  TECHNIQUE: Multidetector CT imaging of the abdomen and pelvis was performed following the standard protocol without intravenous  contrast.  COMPARISON:  01/02/2007 abdominal CT.  07/22/2010 renal sonogram.  FINDINGS: Interval enlargement of right ventral lateral (Spigelian) hernia now large in size and containing small bowel, fat and vessels. The cecum, proximal ascending colon and terminal/distal ileum appear  inflamed with inflamed appearing terminal and distal ileum contained within the right ventral lateral hernia.  Slightly featureless appearance of the rectal sigmoid colon may reflect changes of chronic colitis.  Diverticula distal transverse colon, left colon and sigmoid colon without extra luminal inflammatory process.  No free intraperitoneal air or localized fluid collection noted.  Small hiatal hernia. Under distended stomach with limited evaluation.  Contracted gallbladder without calcified gallstones.  Slightly lobulated appearance of the liver without other findings of cirrhosis. 5 mm low-density right lobe of liver unchanged.  Taking into account limitation by non contrast imaging, no worrisome hepatic, splenic, pancreatic, adrenal or renal lesion.  Left lung base parenchymal changes may represent atelectasis although subtle infiltrate not excluded. Malignancy might less likely consideration. Limited imaging through this region in 3 months can be performed to confirm clearance.  Cardiomegaly.  Very mild coronary artery calcifications.  Atherosclerotic type changes of the aorta with ectasia with maximal transverse dimension of 2.6 cm. Atherosclerotic type changes iliac arteries with ectasia.  Hip joint degenerative changes bilaterally. Mild lumbar spine degenerative changes. No bony destructive lesion. Mild sacroiliac joint degenerative changes.  Under distended urinary bladder without gross abnormality.  No adenopathy.  IMPRESSION: Interval enlargement of right ventral lateral (Spigelian) hernia now large in size and containing small bowel, fat and vessels. The cecum, proximal ascending colon and terminal/distal ileum appear inflamed with inflamed appearing terminal and distal ileum contained within the right ventral lateral hernia.  Please see above for additional findings.  These results will be called to the ordering clinician or representative by the Radiologist Assistant, and communication documented in  the PACS Dashboard.  Electronically Signed: By: Chauncey Cruel M.D. On: 08/19/2013 09:10   X-ray Chest Pa And Lateral   09/03/2013   CLINICAL DATA:  Colitis.  EXAM: CHEST  2 VIEW  COMPARISON:  08/25/2010  FINDINGS: Chronic cardiomegaly. No consolidation or edema. No effusion or pneumothorax. No acute osseous findings.  IMPRESSION: No active cardiopulmonary disease.   Electronically Signed   By: Jorje Guild M.D.   On: 09/03/2013 02:34    Lab Results: Basic Metabolic Panel:  Recent Labs  09/07/13 0524 09/08/13 0602  NA 147 146  K 3.8 4.2  CL 116* 114*  CO2 20 22  GLUCOSE 106* 111*  BUN 16 12  CREATININE 1.79* 1.70*  CALCIUM 8.2* 8.3*   Liver Function Tests: No results found for this basename: AST, ALT, ALKPHOS, BILITOT, PROT, ALBUMIN,  in the last 72 hours   CBC:  Recent Labs  09/07/13 0524  WBC 3.4*  HGB 9.2*  HCT 27.8*  MCV 99.6  PLT 144*    Recent Results (from the past 240 hour(s))  CLOSTRIDIUM DIFFICILE BY PCR     Status: None   Collection Time    09/03/13  1:44 PM      Result Value Range Status   C difficile by pcr NEGATIVE  NEGATIVE Final  SURGICAL PCR SCREEN     Status: None   Collection Time    09/06/13  3:36 PM      Result Value Range Status   MRSA, PCR NEGATIVE  NEGATIVE Final   Staphylococcus aureus NEGATIVE  NEGATIVE Final   Comment:  The Xpert SA Assay (FDA     approved for NASAL specimens     in patients over 65 years of age),     is one component of     a comprehensive surveillance     program.  Test performance has     been validated by [employer] for patients greater     than or equal to 57 year old.     It is not intended     to diagnose infection nor to     guide or monitor treatment.     Hospital Course: This is a 80 year old who had an episode of colitis. He came to my office because after he stopped taking his antibiotics he developed increasing problems with abdominal pain nausea poor appetite and he was thought  that he probably had recurrence of his colitis. However when he was brought to the hospital his CT scan showed small bowel obstruction. He is known to have a hernia in his right side. Surgical consultation was obtained and it was felt that he could undergo surgery once his renal function had stabilized. He appeared to be somewhat dehydrated and his renal function was not stable. He was treated with fluids he began having bowel movements the day after admission and he was placed on liquid diet. Plans were made for him to undergo surgery on the 19th preoperative electrocardiogram showed a new left bundle branch block. Cardiology consultation was obtained. He underwent echocardiogram that showed that his ejection fraction was fairly good at 40-45% it was felt that he was okay for planned surgery. The schedule could not be arranged for to be done during this hospitalization so he was discharged home to return for outpatient surgery  Discharge Exam: Blood pressure 164/91, pulse 78, temperature 98.1 F (36.7 C), temperature source Oral, resp. rate 20, height 5' 11"  (1.803 m), weight 124.24 kg (273 lb 14.4 oz), SpO2 99.00%. He is awake and alert. He looks comfortable. His chest is clear. His abdomen is soft the hernia has been reduced somewhat but is still present  Disposition: Home for outpatient surgery      Discharge Orders   Future Appointments Provider Department Dept Phone   09/15/2013 11:30 AM [facility] Pam [facility] [phone]   Future Orders Complete By Expires   Discharge patient  As directed         Signed: Ilma Achee L Pager [phone]  09/09/2013, 8:49 AM

## 2013-09-10 ENCOUNTER — Encounter (HOSPITAL_COMMUNITY): Payer: Self-pay | Admitting: Pharmacy Technician

## 2013-09-10 LAB — TYPE AND SCREEN
ABO/RH(D): A POS
Antibody Screen: NEGATIVE
UNIT DIVISION: 0
Unit division: 0
Unit division: 0
Unit division: 0

## 2013-09-11 ENCOUNTER — Encounter (HOSPITAL_COMMUNITY)
Admission: RE | Admit: 2013-09-11 | Discharge: 2013-09-11 | Disposition: A | Discharge status: Home / Self Care | Payer: Medicare Other | Source: Ambulatory Visit | Attending: General Surgery | Admitting: General Surgery

## 2013-09-11 ENCOUNTER — Encounter (HOSPITAL_COMMUNITY): Payer: Self-pay

## 2013-09-11 DIAGNOSIS — Z01818 Encounter for other preprocedural examination: Secondary | ICD-10-CM | POA: Insufficient documentation

## 2013-09-11 DIAGNOSIS — Z01812 Encounter for preprocedural laboratory examination: Secondary | ICD-10-CM | POA: Insufficient documentation

## 2013-09-11 HISTORY — DX: Sleep apnea, unspecified: G47.30

## 2013-09-11 LAB — CBC WITH DIFFERENTIAL/PLATELET
BASOS ABS: 0 10*3/uL (ref 0.0–0.1)
Basophils Relative: 0 % (ref 0–1)
Eosinophils Absolute: 0 10*3/uL (ref 0.0–0.7)
Eosinophils Relative: 1 % (ref 0–5)
HEMATOCRIT: 32.6 % — AB (ref 39.0–52.0)
Hemoglobin: 9.9 g/dL — ABNORMAL LOW (ref 13.0–17.0)
LYMPHS PCT: 13 % (ref 12–46)
Lymphs Abs: 0.7 10*3/uL (ref 0.7–4.0)
MCH: 30.7 pg (ref 26.0–34.0)
MCHC: 30.4 g/dL (ref 30.0–36.0)
MCV: 100.9 fL — ABNORMAL HIGH (ref 78.0–100.0)
MONO ABS: 0.5 10*3/uL (ref 0.1–1.0)
Monocytes Relative: 10 % (ref 3–12)
Neutro Abs: 4 10*3/uL (ref 1.7–7.7)
Neutrophils Relative %: 76 % (ref 43–77)
Platelets: 196 10*3/uL (ref 150–400)
RBC: 3.23 MIL/uL — ABNORMAL LOW (ref 4.22–5.81)
RDW: 13.8 % (ref 11.5–15.5)
WBC: 5.3 10*3/uL (ref 4.0–10.5)

## 2013-09-11 LAB — BASIC METABOLIC PANEL
BUN: 17 mg/dL (ref 6–23)
CHLORIDE: 104 meq/L (ref 96–112)
CO2: 22 meq/L (ref 19–32)
CREATININE: 1.92 mg/dL — AB (ref 0.50–1.35)
Calcium: 8.7 mg/dL (ref 8.4–10.5)
GFR calc Af Amer: 37 mL/min — ABNORMAL LOW (ref >90)
GFR calc non Af Amer: 32 mL/min — ABNORMAL LOW (ref >90)
Glucose, Bld: 151 mg/dL — ABNORMAL HIGH (ref 70–99)
Potassium: 4.6 mEq/L (ref 3.7–5.3)
Sodium: 138 mEq/L (ref 137–147)

## 2013-09-11 NOTE — Progress Notes (Signed)
09/11/13 1354  OBSTRUCTIVE SLEEP APNEA  Have you ever been diagnosed with sleep apnea through a sleep study? No  Do you snore loudly (loud enough to be heard through closed doors)?  1  Do you often feel tired, fatigued, or sleepy during the daytime? 1  Has anyone observed you stop breathing during your sleep? 0  Do you have, or are you being treated for high blood pressure? 1  BMI more than 35 kg/m2? 1  Age over 78 years old? 1  Neck circumference greater than 40 cm/18 inches? 1  Gender: 1  Obstructive Sleep Apnea Score 7

## 2013-09-11 NOTE — Patient Instructions (Signed)
Greg [name]  09/11/2013   Your procedure is scheduled on:  09/14/13  Report to [facility] at 12:00 PM.  Call this number if you have problems the morning of surgery: [phone]   Remember:   Do not eat food or drink liquids after midnight.   Take these medicines the morning of surgery with A SIP OF WATER: Carvedilol   Do not wear jewelry, make-up or nail polish.  Do not wear lotions, powders, or perfumes.   Do not shave 48 hours prior to surgery. Men may shave face and neck.  Do not bring valuables to the hospital.  [facility] is not responsible for any belongings or valuables.               Contacts, dentures or bridgework may not be worn into surgery.  Leave suitcase in the car. After surgery it may be brought to your room.  For patients admitted to the hospital, discharge time is determined by you treatment team.               Patients discharged the day of surgery will not be allowed to drive home.    Special Instructions: Shower using CHG 2 nights before surgery and the night before surgery.  If you shower the day of surgery use CHG.  Use special wash - you have one bottle of CHG for all showers.  You should use approximately 1/3 of the bottle for each shower.   Please read over the following fact sheets that you were given: Pain Booklet, Surgical Site Infection Prevention, Anesthesia Post-op Instructions and Care and Recovery After Surgery    Ventral Hernia A ventral hernia (also called an incisional hernia) is a hernia that occurs at the site of a previous surgical cut (incision) in the abdomen. The abdominal wall spans from your lower chest down to your pelvis. If the abdominal wall is weakened from a surgical incision, a hernia can occur. A hernia is a bulge of bowel or muscle tissue pushing out on the weakened part of the abdominal wall. Ventral hernias can get bigger from straining or lifting. Obese and older people are at higher risk for a ventral hernia. People who  develop infections after surgery or require repeat incisions at the same site on the abdomen are also at increased risk. CAUSES  A ventral hernia occurs because of weakness in the abdominal wall at an incision site.  SYMPTOMS  Common symptoms include:  A visible bulge or lump on the abdominal wall.  Pain or tenderness around the lump.  Increased discomfort if you cough or make a sudden movement. If the hernia has blocked part of the intestine, a serious complication can occur (incarcerated or strangulated hernia). This can become a problem that requires emergency surgery because the blood flow to the blocked intestine may be cut off. Symptoms may include:  Feeling sick to your stomach (nauseous).  Throwing up (vomiting).  Stomach swelling (distention) or bloating.  Fever.  Rapid heartbeat. DIAGNOSIS  Your caregiver will take a medical history and perform a physical exam. Various tests may be ordered, such as:  Blood tests.  Urine tests.  Ultrasonography.  X-rays.  Computed tomography (CT). TREATMENT  Watchful waiting may be all that is needed for a smaller hernia that does not cause symptoms. Your caregiver may recommend the use of a supportive belt (truss) that helps to keep the abdominal wall intact. For larger hernias or those that cause pain, surgery to repair the hernia  is usually recommended. If a hernia becomes strangulated, emergency surgery needs to be done right away. HOME CARE INSTRUCTIONS  Avoid putting pressure or strain on the abdominal area.  Avoid heavy lifting.  Use good body positioning for physical tasks. Ask your caregiver about proper body positioning.  Use a supportive belt as directed by your caregiver.  Maintain a healthy weight.  Eat foods that are high in fiber, such as whole grains, fruits, and vegetables. Fiber helps prevent difficult bowel movements (constipation).  Drink enough fluids to keep your urine clear or pale yellow.  Follow up  with your caregiver as directed. SEEK MEDICAL CARE IF:   Your hernia seems to be getting larger or more painful. SEEK IMMEDIATE MEDICAL CARE IF:   You have abdominal pain that is sudden and sharp.  Your pain becomes severe.  You have repeated vomiting.  You are sweating a lot.  You notice a rapid heartbeat.  You develop a fever. MAKE SURE YOU:   Understand these instructions.  Will watch your condition.  Will get help right away if you are not doing well or get worse. Document Released: 07/23/2012 Document Reviewed: 07/23/2012 [facility] Patient Information 2014 [place], [place].    PATIENT INSTRUCTIONS POST-ANESTHESIA  IMMEDIATELY FOLLOWING SURGERY:  Do not drive or operate machinery for the first twenty four hours after surgery.  Do not make any important decisions for twenty four hours after surgery or while taking narcotic pain medications or sedatives.  If you develop intractable nausea and vomiting or a severe headache please notify your doctor immediately.  FOLLOW-UP:  Please make an appointment with your surgeon as instructed. You do not need to follow up with anesthesia unless specifically instructed to do so.  WOUND CARE INSTRUCTIONS (if applicable):  Keep a dry clean dressing on the anesthesia/puncture wound site if there is drainage.  Once the wound has quit draining you may leave it open to air.  Generally you should leave the bandage intact for twenty four hours unless there is drainage.  If the epidural site drains for more than 36-48 hours please call the anesthesia department.  QUESTIONS?:  Please feel free to call your physician or the hospital operator if you have any questions, and they will be happy to assist you.

## 2013-09-14 ENCOUNTER — Encounter (HOSPITAL_COMMUNITY)
Admission: RE | Disposition: A | Discharge status: Home / Self Care | Payer: Self-pay | Source: Ambulatory Visit | Attending: General Surgery

## 2013-09-14 ENCOUNTER — Encounter (HOSPITAL_COMMUNITY): Payer: Self-pay | Admitting: *Deleted

## 2013-09-14 ENCOUNTER — Encounter (HOSPITAL_COMMUNITY): Payer: Medicare Other | Admitting: Anesthesiology

## 2013-09-14 ENCOUNTER — Ambulatory Visit (HOSPITAL_COMMUNITY): Payer: Medicare Other | Admitting: Anesthesiology

## 2013-09-14 ENCOUNTER — Observation Stay (HOSPITAL_COMMUNITY)
Admission: RE | Admit: 2013-09-14 | Discharge: 2013-09-16 | Disposition: A | Discharge status: Home / Self Care | Payer: Medicare Other | Source: Ambulatory Visit | Attending: General Surgery | Admitting: General Surgery

## 2013-09-14 DIAGNOSIS — Z87891 Personal history of nicotine dependence: Secondary | ICD-10-CM | POA: Insufficient documentation

## 2013-09-14 DIAGNOSIS — Z01812 Encounter for preprocedural laboratory examination: Secondary | ICD-10-CM | POA: Insufficient documentation

## 2013-09-14 DIAGNOSIS — Z79899 Other long term (current) drug therapy: Secondary | ICD-10-CM | POA: Insufficient documentation

## 2013-09-14 DIAGNOSIS — E119 Type 2 diabetes mellitus without complications: Secondary | ICD-10-CM | POA: Insufficient documentation

## 2013-09-14 DIAGNOSIS — I10 Essential (primary) hypertension: Secondary | ICD-10-CM | POA: Insufficient documentation

## 2013-09-14 DIAGNOSIS — Z91018 Allergy to other foods: Secondary | ICD-10-CM | POA: Insufficient documentation

## 2013-09-14 DIAGNOSIS — G4730 Sleep apnea, unspecified: Secondary | ICD-10-CM | POA: Insufficient documentation

## 2013-09-14 DIAGNOSIS — D638 Anemia in other chronic diseases classified elsewhere: Secondary | ICD-10-CM | POA: Insufficient documentation

## 2013-09-14 DIAGNOSIS — Z7982 Long term (current) use of aspirin: Secondary | ICD-10-CM | POA: Insufficient documentation

## 2013-09-14 DIAGNOSIS — R269 Unspecified abnormalities of gait and mobility: Secondary | ICD-10-CM | POA: Insufficient documentation

## 2013-09-14 DIAGNOSIS — K436 Other and unspecified ventral hernia with obstruction, without gangrene: Principal | ICD-10-CM | POA: Insufficient documentation

## 2013-09-14 HISTORY — PX: VENTRAL HERNIA REPAIR: SHX424

## 2013-09-14 HISTORY — PX: INSERTION OF MESH: SHX5868

## 2013-09-14 LAB — TYPE AND SCREEN
ABO/RH(D): A POS
ANTIBODY SCREEN: NEGATIVE

## 2013-09-14 LAB — GLUCOSE, CAPILLARY
GLUCOSE-CAPILLARY: 103 mg/dL — AB (ref 70–99)
GLUCOSE-CAPILLARY: 97 mg/dL (ref 70–99)
Glucose-Capillary: 104 mg/dL — ABNORMAL HIGH (ref 70–99)
Glucose-Capillary: 109 mg/dL — ABNORMAL HIGH (ref 70–99)

## 2013-09-14 SURGERY — REPAIR, HERNIA, VENTRAL
Anesthesia: General | Site: Abdomen

## 2013-09-14 MED ORDER — GLYCOPYRROLATE 0.2 MG/ML IJ SOLN
0.2000 mg | Freq: Once | INTRAMUSCULAR | Status: AC
  Administered 2013-09-14: 0.2 mg via INTRAVENOUS

## 2013-09-14 MED ORDER — PROPOFOL 10 MG/ML IV BOLUS
INTRAVENOUS | Status: AC
  Filled 2013-09-14: qty 20

## 2013-09-14 MED ORDER — ONDANSETRON HCL 4 MG/2ML IJ SOLN
INTRAMUSCULAR | Status: AC
  Filled 2013-09-14: qty 2

## 2013-09-14 MED ORDER — LACTATED RINGERS IV SOLN
INTRAVENOUS | Status: DC
  Administered 2013-09-14: 16:00:00 via INTRAVENOUS
  Administered 2013-09-15: 1000 via INTRAVENOUS
  Administered 2013-09-15 (×2): via INTRAVENOUS

## 2013-09-14 MED ORDER — FENTANYL CITRATE 0.05 MG/ML IJ SOLN
INTRAMUSCULAR | Status: DC | PRN
  Administered 2013-09-14 (×2): 50 ug via INTRAVENOUS
  Administered 2013-09-14: 100 ug via INTRAVENOUS

## 2013-09-14 MED ORDER — HYDROCODONE-ACETAMINOPHEN 5-325 MG PO TABS: 1.0000 | ORAL_TABLET | ORAL | Status: DC | PRN

## 2013-09-14 MED ORDER — ENOXAPARIN SODIUM 40 MG/0.4ML ~~LOC~~ SOLN
40.0000 mg | SUBCUTANEOUS | Status: DC
  Administered 2013-09-14 – 2013-09-15 (×2): 40 mg via SUBCUTANEOUS
  Filled 2013-09-14 (×2): qty 0.4

## 2013-09-14 MED ORDER — CEFAZOLIN SODIUM-DEXTROSE 2-3 GM-% IV SOLR: 2.0000 g | INTRAVENOUS | Status: DC

## 2013-09-14 MED ORDER — MIDAZOLAM HCL 2 MG/2ML IJ SOLN
1.0000 mg | INTRAMUSCULAR | Status: DC | PRN
  Administered 2013-09-14: 2 mg via INTRAVENOUS

## 2013-09-14 MED ORDER — CHLORHEXIDINE GLUCONATE 4 % EX LIQD: 1.0000 "application " | Freq: Once | CUTANEOUS | Status: DC

## 2013-09-14 MED ORDER — NEOSTIGMINE METHYLSULFATE 1 MG/ML IJ SOLN
INTRAMUSCULAR | Status: AC
  Filled 2013-09-14: qty 1

## 2013-09-14 MED ORDER — MIDAZOLAM HCL 2 MG/2ML IJ SOLN
INTRAMUSCULAR | Status: AC
  Filled 2013-09-14: qty 2

## 2013-09-14 MED ORDER — PIOGLITAZONE HCL-METFORMIN HCL 15-500 MG PO TABS: 1.0000 | ORAL_TABLET | Freq: Two times a day (BID) | ORAL | Status: DC

## 2013-09-14 MED ORDER — ONDANSETRON HCL 4 MG PO TABS: 4.0000 mg | ORAL_TABLET | Freq: Four times a day (QID) | ORAL | Status: DC | PRN

## 2013-09-14 MED ORDER — CARVEDILOL 3.125 MG PO TABS
3.1250 mg | ORAL_TABLET | Freq: Two times a day (BID) | ORAL | Status: DC
  Administered 2013-09-14 – 2013-09-16 (×4): 3.125 mg via ORAL
  Filled 2013-09-14 (×4): qty 1

## 2013-09-14 MED ORDER — CEFAZOLIN SODIUM 1-5 GM-% IV SOLN: 1.0000 g | INTRAVENOUS | Status: DC

## 2013-09-14 MED ORDER — LACTATED RINGERS IV SOLN
INTRAVENOUS | Status: DC
  Administered 2013-09-14: 13:00:00 via INTRAVENOUS

## 2013-09-14 MED ORDER — CEFAZOLIN SODIUM 1-5 GM-% IV SOLN
INTRAVENOUS | Status: AC
  Filled 2013-09-14: qty 50

## 2013-09-14 MED ORDER — NEOSTIGMINE METHYLSULFATE 1 MG/ML IJ SOLN
INTRAMUSCULAR | Status: DC | PRN
  Administered 2013-09-14: 2 mg via INTRAVENOUS

## 2013-09-14 MED ORDER — ROCURONIUM BROMIDE 50 MG/5ML IV SOLN
INTRAVENOUS | Status: AC
  Filled 2013-09-14: qty 1

## 2013-09-14 MED ORDER — GLYCOPYRROLATE 0.2 MG/ML IJ SOLN
INTRAMUSCULAR | Status: AC
  Filled 2013-09-14: qty 2

## 2013-09-14 MED ORDER — CEFAZOLIN SODIUM-DEXTROSE 2-3 GM-% IV SOLR
INTRAVENOUS | Status: AC
  Filled 2013-09-14: qty 50

## 2013-09-14 MED ORDER — PROPOFOL 10 MG/ML IV BOLUS
INTRAVENOUS | Status: DC | PRN
  Administered 2013-09-14: 200 mg via INTRAVENOUS

## 2013-09-14 MED ORDER — LIDOCAINE HCL 1 % IJ SOLN
INTRAMUSCULAR | Status: DC | PRN
  Administered 2013-09-14: 40 mg via INTRADERMAL

## 2013-09-14 MED ORDER — DEXTROSE 5 % IV SOLN: 3.0000 g | INTRAVENOUS | Status: DC

## 2013-09-14 MED ORDER — 0.9 % SODIUM CHLORIDE (POUR BTL) OPTIME
TOPICAL | Status: DC | PRN
  Administered 2013-09-14: 1000 mL

## 2013-09-14 MED ORDER — POVIDONE-IODINE 10 % EX OINT
TOPICAL_OINTMENT | CUTANEOUS | Status: AC
  Filled 2013-09-14: qty 2

## 2013-09-14 MED ORDER — INSULIN ASPART 100 UNIT/ML ~~LOC~~ SOLN: 0.0000 [IU] | Freq: Every day | SUBCUTANEOUS | Status: DC

## 2013-09-14 MED ORDER — METFORMIN HCL 500 MG PO TABS
500.0000 mg | ORAL_TABLET | Freq: Two times a day (BID) | ORAL | Status: DC
  Administered 2013-09-14 – 2013-09-15 (×2): 500 mg via ORAL
  Filled 2013-09-14 (×2): qty 1

## 2013-09-14 MED ORDER — SUCCINYLCHOLINE CHLORIDE 20 MG/ML IJ SOLN
INTRAMUSCULAR | Status: DC | PRN
  Administered 2013-09-14: 120 mg via INTRAVENOUS

## 2013-09-14 MED ORDER — LIDOCAINE HCL (PF) 1 % IJ SOLN
INTRAMUSCULAR | Status: AC
  Filled 2013-09-14: qty 5

## 2013-09-14 MED ORDER — FENTANYL CITRATE 0.05 MG/ML IJ SOLN
INTRAMUSCULAR | Status: AC
  Filled 2013-09-14: qty 5

## 2013-09-14 MED ORDER — ENOXAPARIN SODIUM 40 MG/0.4ML ~~LOC~~ SOLN
40.0000 mg | Freq: Once | SUBCUTANEOUS | Status: AC
  Administered 2013-09-14: 40 mg via SUBCUTANEOUS

## 2013-09-14 MED ORDER — FENTANYL CITRATE 0.05 MG/ML IJ SOLN: 25.0000 ug | INTRAMUSCULAR | Status: DC | PRN

## 2013-09-14 MED ORDER — GLYCOPYRROLATE 0.2 MG/ML IJ SOLN
INTRAMUSCULAR | Status: AC
  Filled 2013-09-14: qty 1

## 2013-09-14 MED ORDER — ROCURONIUM BROMIDE 100 MG/10ML IV SOLN
INTRAVENOUS | Status: DC | PRN
  Administered 2013-09-14: 32 mg via INTRAVENOUS
  Administered 2013-09-14: 8 mg via INTRAVENOUS

## 2013-09-14 MED ORDER — ONDANSETRON HCL 4 MG/2ML IJ SOLN: 4.0000 mg | Freq: Four times a day (QID) | INTRAMUSCULAR | Status: DC | PRN

## 2013-09-14 MED ORDER — MORPHINE SULFATE 2 MG/ML IJ SOLN: 2.0000 mg | INTRAMUSCULAR | Status: DC | PRN

## 2013-09-14 MED ORDER — ONDANSETRON HCL 4 MG/2ML IJ SOLN: 4.0000 mg | Freq: Once | INTRAMUSCULAR | Status: DC | PRN

## 2013-09-14 MED ORDER — BUPIVACAINE HCL (PF) 0.5 % IJ SOLN
INTRAMUSCULAR | Status: DC | PRN
  Administered 2013-09-14: 10 mL

## 2013-09-14 MED ORDER — ENOXAPARIN SODIUM 40 MG/0.4ML ~~LOC~~ SOLN
SUBCUTANEOUS | Status: AC
  Filled 2013-09-14: qty 0.4

## 2013-09-14 MED ORDER — PIOGLITAZONE HCL 30 MG PO TABS
15.0000 mg | ORAL_TABLET | Freq: Two times a day (BID) | ORAL | Status: DC
  Administered 2013-09-14 – 2013-09-16 (×4): 15 mg via ORAL
  Filled 2013-09-14 (×4): qty 1

## 2013-09-14 MED ORDER — GLYCOPYRROLATE 0.2 MG/ML IJ SOLN
INTRAMUSCULAR | Status: DC | PRN
  Administered 2013-09-14: .4 mg via INTRAVENOUS

## 2013-09-14 MED ORDER — SUCCINYLCHOLINE CHLORIDE 20 MG/ML IJ SOLN
INTRAMUSCULAR | Status: AC
  Filled 2013-09-14: qty 1

## 2013-09-14 MED ORDER — INSULIN ASPART 100 UNIT/ML ~~LOC~~ SOLN
0.0000 [IU] | Freq: Three times a day (TID) | SUBCUTANEOUS | Status: DC
  Administered 2013-09-15 (×2): 2 [IU] via SUBCUTANEOUS
  Administered 2013-09-16: 09:00:00 via SUBCUTANEOUS

## 2013-09-14 MED ORDER — ONDANSETRON HCL 4 MG/2ML IJ SOLN
4.0000 mg | Freq: Once | INTRAMUSCULAR | Status: AC
  Administered 2013-09-14: 4 mg via INTRAVENOUS

## 2013-09-14 SURGICAL SUPPLY — 44 items
BAG HAMPER (MISCELLANEOUS) ×3 IMPLANT
BLADE SURG SZ11 CARB STEEL (BLADE) IMPLANT
CLOTH BEACON ORANGE TIMEOUT ST (SAFETY) ×3 IMPLANT
COVER LIGHT HANDLE STERIS (MISCELLANEOUS) ×6 IMPLANT
DECANTER SPIKE VIAL GLASS SM (MISCELLANEOUS) ×2 IMPLANT
DURAPREP 26ML APPLICATOR (WOUND CARE) ×3 IMPLANT
ELECT REM PT RETURN 9FT ADLT (ELECTROSURGICAL) ×3
ELECTRODE REM PT RTRN 9FT ADLT (ELECTROSURGICAL) ×1 IMPLANT
FORMALIN 10 PREFIL 480ML (MISCELLANEOUS) ×1 IMPLANT
GLOVE BIO SURGEON STRL SZ7 (GLOVE) ×2 IMPLANT
GLOVE BIO SURGEON STRL SZ7.5 (GLOVE) ×2 IMPLANT
GLOVE BIOGEL PI IND STRL 7.0 (GLOVE) IMPLANT
GLOVE BIOGEL PI IND STRL 8 (GLOVE) ×1 IMPLANT
GLOVE BIOGEL PI INDICATOR 7.0 (GLOVE) ×2
GLOVE BIOGEL PI INDICATOR 8 (GLOVE) ×2
GLOVE ECLIPSE 7.5 STRL STRAW (GLOVE) ×3 IMPLANT
GOWN STRL REUS W/TWL LRG LVL3 (GOWN DISPOSABLE) ×9 IMPLANT
INST SET MAJOR GENERAL (KITS) ×3 IMPLANT
KIT ROOM TURNOVER APOR (KITS) ×3 IMPLANT
MANIFOLD NEPTUNE II (INSTRUMENTS) ×3 IMPLANT
MESH VENTRALEX ST 2.5 CRC MED (Mesh General) ×2 IMPLANT
NDL HYPO 25X1 1.5 SAFETY (NEEDLE) ×1 IMPLANT
NEEDLE HYPO 25X1 1.5 SAFETY (NEEDLE) ×3 IMPLANT
NS IRRIG 1000ML POUR BTL (IV SOLUTION) ×3 IMPLANT
PACK ABDOMINAL MAJOR (CUSTOM PROCEDURE TRAY) ×3 IMPLANT
PAD ARMBOARD 7.5X6 YLW CONV (MISCELLANEOUS) ×3 IMPLANT
SET BASIN LINEN APH (SET/KITS/TRAYS/PACK) ×3 IMPLANT
SPONGE GAUZE 4X4 12PLY (GAUZE/BANDAGES/DRESSINGS) ×3 IMPLANT
STAPLER VISISTAT (STAPLE) ×3 IMPLANT
SUT ETHIBOND 0 MO6 C/R (SUTURE) ×2 IMPLANT
SUT ETHIBOND NAB MO 7 #0 18IN (SUTURE) ×2 IMPLANT
SUT NOVA NAB GS-21 1 T12 (SUTURE) IMPLANT
SUT NOVA NAB GS-22 2 2-0 T-19 (SUTURE) ×1 IMPLANT
SUT NOVA NAB GS-26 0 60 (SUTURE) IMPLANT
SUT SILK 2 0 (SUTURE)
SUT SILK 2-0 18XBRD TIE 12 (SUTURE) IMPLANT
SUT VIC AB 2-0 CT2 27 (SUTURE) ×4 IMPLANT
SUT VIC AB 3-0 SH 27 (SUTURE)
SUT VIC AB 3-0 SH 27X BRD (SUTURE) IMPLANT
SUT VIC AB 4-0 PS2 27 (SUTURE) IMPLANT
SUT VICRYL AB 2 0 TIES (SUTURE) ×1 IMPLANT
SYR CONTROL 10ML LL (SYRINGE) ×3 IMPLANT
TAPE CLOTH SURG 4X10 WHT LF (GAUZE/BANDAGES/DRESSINGS) ×2 IMPLANT
TRAY FOLEY BAG SILVER LF 16FR (CATHETERS) ×2 IMPLANT

## 2013-09-14 NOTE — H&P (Signed)
Greg [name] is an 80 y.o. male.   Chief Complaint: Right spigelian hernia HPI: Patient is a 80 year old white male who was recently in the hospital for nausea and vomiting secondary to a partially incarcerated right Spigleian hernia. He did have renal insufficiency do to dehydration and had to be hydrated. He was evaluated by cardiology for cardiac clearance. Now presents for a ventral herniorrhaphy with mesh.  Past Medical History  Diagnosis Date  . Diabetes mellitus   . Anemia of chronic disease   . Leukopenia     Intermittent  . Mild renal insufficiency   . Herpes zoster     in the past  . Obesity   . Exposure to hazardous substance   . Anemia of chronic disease 02/10/2011  . Intermittent Leukopenia 02/10/2011  . Renal insufficiency 02/26/2011  . Hypertension   . Sleep apnea     Sleep Apnea score of 7    Past Surgical History  Procedure Laterality Date  . Nasal polyp surgery      Family History  Problem Relation Age of Onset  . Heart attack Brother     CABG  . Diabetes Brother   . Stroke Father   . Heart Problems Mother    Social History:  reports that he has quit smoking. His smoking use included Cigars. His smokeless tobacco use includes Chew. He reports that he does not drink alcohol or use illicit drugs.  Allergies:  Allergies  Allergen Reactions  . Celery Oil     vomiting    No prescriptions prior to admission    No results found for this or any previous visit (from the past 48 hour(s)). No results found.  Review of Systems  Respiratory: Negative.   Cardiovascular: Negative.   Gastrointestinal: Positive for nausea.  Genitourinary: Negative.   Musculoskeletal: Negative.   All other systems reviewed and are negative.    There were no vitals taken for this visit. Physical Exam  Constitutional: He is oriented to person, place, and time. He appears well-developed and well-nourished.  HENT:  Head: Normocephalic and atraumatic.  Neck: Normal range of  motion. Neck supple.  Cardiovascular: Normal heart sounds.   No murmur heard. Extra beats noted.  Respiratory: Effort normal and breath sounds normal.  GI: Soft. Bowel sounds are normal. He exhibits no distension.  Large right Spigelian hernia present.  Neurological: He is alert and oriented to person, place, and time.  Skin: Skin is warm and dry.     Assessment/Plan Impression: Ventral hernia Plan: Patient will be brought to the operating room for a ventral herniorrhaphy with mesh. The risks and benefits of the procedure including bleeding, infection, cardiopulmonary difficulties, and a possibly recurrence of the hernia were fully explained to the patient, who gave informed consent.  Rissa Turley A 09/14/2013, 11:40 AM

## 2013-09-14 NOTE — Op Note (Signed)
Patient:  Greg [name]  DOB:  11-06-1931  MRN:  [id number]   Preop Diagnosis:  Ventral hernia, incarcerated  Postop Diagnosis:  Same  Procedure:  Ventral herniorrhaphy with mesh  Surgeon:  Aviva Signs, M.D.  Anes:  General  Indications:  Patient is a 80 year old white male who has had a recent incarcerated right Spigelian hernia who now presents for repair of a ventral hernia with mesh. The risks and benefits of the procedure including bleeding, infection, cardiopulmonary difficulties, and the possibility of recurrence of the hernia were fully explained to the patient, who gave informed consent.  Procedure note:  Patient is placed the supine position. After induction of general anesthesia, the abdomen was prepped and draped using usual sterile technique with DuraPrep. Surgical site confirmation was performed.  An oblique incision was made in the right lower quadrant the abdomen. The dissection was taken down to the external oblique aponeuroses. This was incised along the semi-lunar line. The dissection was taken down to the hernia sac. The patient had a large hernia sac that was freed away from the surrounding fascial attachments and ultimately inverted once the perineal reflection was identified. A medium size 6.4 cm ventralax patch was then inserted and secured to the fascia with the tabs using 0 Ethibond interrupted sutures. The internal/external oblique aponeuroses were reapproximated over towards the rectus muscle medially using 0 Ethibond interrupted sutures. The subcutaneous layer with was reapproximated using 2-0 Vicryl interrupted sutures. The skin was closed using staples. 0.5% Sensorcaine was instilled the surrounding wound. Betadine ointment and a dry sterile dressing were applied.  All tape and needle counts were correct the end of the procedure. The patient was awakened and transferred to PACU in stable condition.  Complications:  None  EBL:  Minimal  Specimen:   None

## 2013-09-14 NOTE — Transfer of Care (Signed)
Immediate Anesthesia Transfer of Care Note  Patient: Greg [name]  Procedure(s) Performed: Procedure(s): HERNIA REPAIR VENTRAL ADULT WITH MESH (N/A) INSERTION OF MESH (N/A)  Patient Location: PACU  Anesthesia Type:General  Level of Consciousness: awake  Airway & Oxygen Therapy: Patient Spontanous Breathing and Patient connected to face mask oxygen  Post-op Assessment: Report given to PACU RN  Post vital signs: Reviewed  Complications: No apparent anesthesia complications

## 2013-09-14 NOTE — Anesthesia Preprocedure Evaluation (Signed)
Anesthesia Evaluation  Patient identified by MRN, date of birth, ID band Patient awake    Reviewed: Allergy & Precautions, H&P , NPO status , Patient's Chart, lab work & pertinent test results  Airway Mallampati: II TM Distance: >3 FB Neck ROM: Full    Dental  (+) Partial Upper and Partial Lower   Pulmonary sleep apnea , former smoker,  breath sounds clear to auscultation        Cardiovascular hypertension, Pt. on medications and Pt. on home beta blockers Rhythm:Regular Rate:Normal     Neuro/Psych    GI/Hepatic negative GI ROS,   Endo/Other  diabetes, Type 2Morbid obesity  Renal/GU Renal InsufficiencyRenal disease     Musculoskeletal   Abdominal   Peds  Hematology  (+) anemia ,   Anesthesia Other Findings   Reproductive/Obstetrics                           Anesthesia Physical Anesthesia Plan  ASA: III  Anesthesia Plan: General   Post-op Pain Management:    Induction: Intravenous  Airway Management Planned: Oral ETT  Additional Equipment:   Intra-op Plan:   Post-operative Plan: Extubation in OR  Informed Consent: I have reviewed the patients History and Physical, chart, labs and discussed the procedure including the risks, benefits and alternatives for the proposed anesthesia with the patient or authorized representative who has indicated his/her understanding and acceptance.     Plan Discussed with:   Anesthesia Plan Comments:         Anesthesia Quick Evaluation

## 2013-09-14 NOTE — Interval H&P Note (Signed)
History and Physical Interval Note:  09/14/2013 1:29 PM  Greg [name]  has presented today for surgery, with the diagnosis of ventral hernia  The various methods of treatment have been discussed with the patient and family. After consideration of risks, benefits and other options for treatment, the patient has consented to  Procedure(s): HERNIA REPAIR VENTRAL ADULT WITH MESH (N/A) as a surgical intervention .  The patient's history has been reviewed, patient examined, no change in status, stable for surgery.  I have reviewed the patient's chart and labs.  Questions were answered to the patient's satisfaction.     Aviva Signs A

## 2013-09-14 NOTE — Anesthesia Postprocedure Evaluation (Signed)
  Anesthesia Post-op Note  Patient: Greg [name]  Procedure(s) Performed: Procedure(s): HERNIA REPAIR VENTRAL ADULT WITH MESH (N/A) INSERTION OF MESH (N/A)  Patient Location: PACU  Anesthesia Type:General  Level of Consciousness: awake, alert  and oriented  Airway and Oxygen Therapy: Patient Spontanous Breathing and Patient connected to face mask oxygen  Post-op Pain: none  Post-op Assessment: Post-op Vital signs reviewed, Patient's Cardiovascular Status Stable, Respiratory Function Stable, Patent Airway and No signs of Nausea or vomiting  Post-op Vital Signs: Reviewed and stable  Complications: No apparent anesthesia complications

## 2013-09-14 NOTE — Anesthesia Procedure Notes (Signed)
Procedure Name: Intubation Date/Time: 09/14/2013 1:51 PM Performed by: Tressie Stalker E Pre-anesthesia Checklist: Patient identified, Patient being monitored, Timeout performed, Emergency Drugs available and Suction available Patient Re-evaluated:Patient Re-evaluated prior to inductionOxygen Delivery Method: Circle System Utilized Preoxygenation: Pre-oxygenation with 100% oxygen Intubation Type: IV induction Ventilation: Mask ventilation without difficulty Laryngoscope Size: Mac and 3 Grade View: Grade I Tube type: Oral Tube size: 8.0 mm Number of attempts: 1 Airway Equipment and Method: stylet Placement Confirmation: ETT inserted through vocal cords under direct vision,  positive ETCO2 and breath sounds checked- equal and bilateral Secured at: 21 cm Tube secured with: Tape Dental Injury: Teeth and Oropharynx as per pre-operative assessment

## 2013-09-15 ENCOUNTER — Encounter (HOSPITAL_COMMUNITY): Payer: Self-pay | Admitting: General Surgery

## 2013-09-15 ENCOUNTER — Ambulatory Visit: Payer: Medicare Other | Admitting: Gastroenterology

## 2013-09-15 LAB — GLUCOSE, CAPILLARY
GLUCOSE-CAPILLARY: 101 mg/dL — AB (ref 70–99)
GLUCOSE-CAPILLARY: 115 mg/dL — AB (ref 70–99)
Glucose-Capillary: 146 mg/dL — ABNORMAL HIGH (ref 70–99)
Glucose-Capillary: 147 mg/dL — ABNORMAL HIGH (ref 70–99)

## 2013-09-15 LAB — BASIC METABOLIC PANEL
BUN: 18 mg/dL (ref 6–23)
CHLORIDE: 107 meq/L (ref 96–112)
CO2: 25 meq/L (ref 19–32)
Calcium: 8.5 mg/dL (ref 8.4–10.5)
Creatinine, Ser: 1.74 mg/dL — ABNORMAL HIGH (ref 0.50–1.35)
GFR calc Af Amer: 41 mL/min — ABNORMAL LOW (ref >90)
GFR calc non Af Amer: 36 mL/min — ABNORMAL LOW (ref >90)
Glucose, Bld: 95 mg/dL (ref 70–99)
POTASSIUM: 4.2 meq/L (ref 3.7–5.3)
SODIUM: 144 meq/L (ref 137–147)

## 2013-09-15 LAB — CBC
HCT: 29.2 % — ABNORMAL LOW (ref 39.0–52.0)
Hemoglobin: 9.6 g/dL — ABNORMAL LOW (ref 13.0–17.0)
MCH: 32.8 pg (ref 26.0–34.0)
MCHC: 32.9 g/dL (ref 30.0–36.0)
MCV: 99.7 fL (ref 78.0–100.0)
Platelets: 205 10*3/uL (ref 150–400)
RBC: 2.93 MIL/uL — AB (ref 4.22–5.81)
RDW: 14.6 % (ref 11.5–15.5)
WBC: 5.6 10*3/uL (ref 4.0–10.5)

## 2013-09-15 MED ORDER — POVIDONE-IODINE 10 % EX OINT
TOPICAL_OINTMENT | CUTANEOUS | Status: DC | PRN
  Administered 2013-09-14: 1 via TOPICAL

## 2013-09-15 NOTE — Progress Notes (Signed)
UR chart review completed.  

## 2013-09-15 NOTE — Progress Notes (Signed)
Present with patient and family for support.

## 2013-09-15 NOTE — Progress Notes (Signed)
1 Day Post-Op  Subjective: Resting comfortably. No shortness of breath.  Objective: Vital signs in last 24 hours: Temp:  [97.8 F (36.6 C)-98.8 F (37.1 C)] 98.8 F (37.1 C) (01/27 0730) Pulse Rate:  [70-79] 79 (01/27 0758) Resp:  [7-21] 19 (01/27 0800) BP: (130-162)/(57-107) 154/71 mmHg (01/27 0800) SpO2:  [64 %-100 %] 96 % (01/26 2200) Weight:  [123 kg (271 lb 2.7 oz)-124.1 kg (273 lb 9.5 oz)] 124.1 kg (273 lb 9.5 oz) (01/27 0500) Last BM Date: 09/14/13  Intake/Output from previous day: 01/26 0701 - 01/27 0700 In: 2406.3 [I.V.:2406.3] Out: 1350 [Urine:1350] Intake/Output this shift: Total I/O In: 250 [I.V.:250] Out: -   General appearance: alert, cooperative and no distress Resp: clear to auscultation bilaterally Cardio: S1, S2 normal and Occasional PVCs noted on telemetry. GI: Soft. Dressing intact. Abdominal binder intact.  Lab Results:   Recent Labs  09/15/13 0429  WBC 5.6  HGB 9.6*  HCT 29.2*  PLT 205   BMET  Recent Labs  09/15/13 0429  NA 144  K 4.2  CL 107  CO2 25  GLUCOSE 95  BUN 18  CREATININE 1.74*  CALCIUM 8.5   PT/INR No results found for this basename: LABPROT, INR,  in the last 72 hours  Studies/Results: No results found.  Anti-infectives: Anti-infectives   Start     Dose/Rate Route Frequency Ordered Stop   09/15/13 0600  ceFAZolin (ANCEF) 3 g in dextrose 5 % 50 mL IVPB  Status:  Discontinued     3 g 160 mL/hr over 30 Minutes Intravenous On call to O.R. 09/14/13 1242 09/14/13 1249   09/14/13 1242  ceFAZolin (ANCEF) IVPB 1 g/50 mL premix  Status:  Discontinued     1 g 100 mL/hr over 30 Minutes Intravenous 60 min pre-op 09/14/13 1242 09/14/13 1612   09/14/13 1242  ceFAZolin (ANCEF) IVPB 2 g/50 mL premix  Status:  Discontinued     2 g 100 mL/hr over 30 Minutes Intravenous 60 min pre-op 09/14/13 1242 09/14/13 1612      Assessment/Plan: s/p Procedure(s): HERNIA REPAIR VENTRAL ADULT WITH MESH INSERTION OF MESH Impression: Stable  on postoperative day one. No apparent cardiac or renal complications at this time. We'll get physical therapy involved in helping patient ambulate. Will DC Foley today. Anticipate discharge in next 24-48 hours.  LOS: 1 day    Treg Diemer A 09/15/2013

## 2013-09-15 NOTE — Anesthesia Postprocedure Evaluation (Signed)
  Anesthesia Post-op Note  Patient: Greg [name]  Procedure(s) Performed: Procedure(s): HERNIA REPAIR VENTRAL ADULT WITH MESH (N/A) INSERTION OF MESH (N/A)  Patient Location: ICU 9  Anesthesia Type:General  Level of Consciousness: awake, alert , oriented and patient cooperative  Airway and Oxygen Therapy: Patient Spontanous Breathing  Post-op Pain: mild  Post-op Assessment: Post-op Vital signs reviewed, Patient's Cardiovascular Status Stable, Respiratory Function Stable, Patent Airway, No signs of Nausea or vomiting and Pain level controlled  Post-op Vital Signs: Reviewed and stable  Complications: No apparent anesthesia complications

## 2013-09-15 NOTE — Evaluation (Addendum)
Physical Therapy Evaluation Patient Details Name: Greg [name] MRN: [id number] DOB: 16-Nov-1931 Today's Date: 09/15/2013 Time: 2725-3664 PT Time Calculation (min): 56 min  PT Assessment / Plan / Recommendation History of Present Illness  Pt is admitted for a ventral hernia repair done 09-14-13.  He has recently had pneumonia (12-14) and last week had a fall in the bathtub.  Clinical Impression   Pt was seen for evaluation.  He is morbidly obese, very alert and cooperative.  He is normally active at home but had become sedentary since 12-14 due to illness.  He now has a right abdominal incision and is wearing an abdominal binder.  Pain is well controlled.  He had difficulty with transfer sit to stand and bed needed to be elevated for success with standing.  He now needs a walker for gait (had been independent) and I would recommend HHPT at d/c.  We discussed bathroom safety and he is going to use his tub transfer bench when able to bathe.    PT Assessment  Patient needs continued PT services    Follow Up Recommendations  Home health PT    Does the patient have the potential to tolerate intense rehabilitation      Barriers to Discharge        Equipment Recommendations  None recommended by PT    Recommendations for Other Services     Frequency Min 3X/week    Precautions / Restrictions Precautions Precautions: Fall Precaution Comments: right abdominal incision with abdominal binder in place Restrictions Weight Bearing Restrictions: No   Pertinent Vitals/Pain       Mobility  Bed Mobility Overal bed mobility: Needs Assistance Bed Mobility: Sidelying to Sit Sidelying to sit: Min assist Transfers Overall transfer level: Needs assistance Equipment used: Rolling walker (2 wheeled) Transfers: Sit to/from Stand Sit to Stand: Min assist;From elevated surface Ambulation/Gait Ambulation/Gait assistance: Supervision Ambulation Distance (Feet): 80 Feet Assistive device: Rolling  walker (2 wheeled) Gait Pattern/deviations: Trunk flexed Gait velocity interpretation: Below normal speed for age/gender General Gait Details: gait is stable with a walker    Exercises General Exercises - Lower Extremity Ankle Circles/Pumps: AROM;Both;10 reps;Supine Quad Sets: AROM;Both;10 reps;Supine Gluteal Sets: AROM;Both;10 reps;Supine Short Arc Quad: AROM;Both;10 reps;Supine Heel Slides: AAROM;Both;10 reps;Supine Hip ABduction/ADduction: AAROM;Both;10 reps;Supine   PT Diagnosis: Difficulty walking;Generalized weakness  PT Problem List: Decreased strength;Decreased activity tolerance;Decreased mobility;Cardiopulmonary status limiting activity;Obesity PT Treatment Interventions: Gait training;Functional mobility training;Therapeutic exercise     PT Goals(Current goals can be found in the care plan section) Acute Rehab PT Goals Patient Stated Goal: return to independence PT Goal Formulation: With patient/family Time For Goal Achievement: 10/30/13 Potential to Achieve Goals: Good  Visit Information  Last PT Received On: 09/15/13 History of Present Illness: Pt is admitted for a ventral hernia repair done 09-14-13.  He has recently had pneumonia (12-14) and last week had a fall in the bathtub.       Prior [place] expects to be discharged to:: Private residence Living Arrangements: Spouse/significant other Available Help at Discharge: Family;Available 24 hours/day Type of Home: House Home Access: Level entry Home Layout: One level Home Equipment: Walker - 2 wheels;Cane - quad;Bedside commode Prior Function Level of Independence: Independent Communication Communication: No difficulties    Cognition Walking Cognition Arousal/Alertness: Awake/alert Behavior During Therapy: WFL for tasks assessed/performed Overall Cognitive Status: Within Functional Limits for tasks assessed    Extremity/Trunk Assessment Lower Extremity Assessment Lower  Extremity Assessment: Generalized weakness   Balance Balance Overall balance  assessment: No apparent balance deficits (not formally assessed)  End of Session PT - End of Session Equipment Utilized During Treatment: Gait belt Activity Tolerance: Patient tolerated treatment well Patient left: in chair;with call bell/phone within reach Nurse Communication: Mobility status  GP   G Codes:  For Mobility/Walking    Current:  CI    Goal:  CI     Discharge:  CI  Sable Feil 09/15/2013, 3:27 PM

## 2013-09-15 NOTE — Progress Notes (Signed)
Nutrition Brief Note  Patient identified on the Malnutrition Screening Tool (MST) Report. He is s/p hernia repair this afternoon.  Wt Readings from Last 15 Encounters:  09/15/13 273 lb 9.5 oz (124.1 kg)  09/15/13 273 lb 9.5 oz (124.1 kg)  09/11/13 272 lb (123.378 kg)  09/07/13 273 lb 14.4 oz (124.24 kg)  09/07/13 273 lb 14.4 oz (124.24 kg)  08/28/10 273 lb 12.8 oz (124.195 kg)  08/28/10 273 lb 12.8 oz (124.195 kg)    Body mass index is 38.18 kg/(m^2). Patient meets criteria for obesity class II  based on current BMI. Weight hx stable for past 3 years.  Current diet order is CHO Modified patient is consuming approximately 75% of meals at this time. Labs and medications reviewed.   No nutrition interventions warranted at this time.Recommend pt be referred to outpatient RD for follow-up related to weight loss if pt is interested.  Colman Cater MS,RD,CSG,LDN Office: [phone] Pager: [phone]

## 2013-09-15 NOTE — Care Management Note (Addendum)
    Page 1 of 2   09/16/2013     11:12:48 AM   CARE MANAGEMENT NOTE 09/16/2013  Patient:  RUFFUS, KAMAKA   Account Number:  [id number]  Date Initiated:  09/15/2013  Documentation initiated by:  Theophilus Kinds  Subjective/Objective Assessment:   Pt admitted from home s/p hernia repair. Pt lives with his wife and will return home at discharge. Pt has a walker, BSC, and shower bench for home use. Pts wife wants [facility] RN and PT at discharge.     Action/Plan:   Pts wife chose [facility] for [facility] services. Romualdo Bolk of [facility] aware and will collec the pts information from the chart. Will continue to follow for discharge needs.   Anticipated DC Date:  09/16/2013   Anticipated DC Plan:  [place]  CM consult      [facility] Choice  HOME HEALTH   Choice offered to / List presented to:  C-3 Spouse        HH arranged  HH-1 RN  [place].   Status of service:  Completed, signed off Medicare Important Message given?   (If response is "NO", the following Medicare IM given date fields will be blank) Date Medicare IM given:   Date Additional Medicare IM given:    Discharge Disposition:  [place]  Per UR Regulation:    If discussed at Long Length of Stay Meetings, dates discussed:    Comments:  09/16/13 [place], RN BSN CM PT discharged home today with [facility] RN and PT. Romualdo Bolk of [facility] is aware and will collect pts information from the chart. [place] services to start within 48 hours of discharge. No DME needs noted. Pt and pts nurse aware of discharge arrangements.  09/15/13 [place], RN BSN CM

## 2013-09-16 LAB — BASIC METABOLIC PANEL
BUN: 19 mg/dL (ref 6–23)
CO2: 26 mEq/L (ref 19–32)
CREATININE: 1.61 mg/dL — AB (ref 0.50–1.35)
Calcium: 8.5 mg/dL (ref 8.4–10.5)
Chloride: 101 mEq/L (ref 96–112)
GFR calc non Af Amer: 39 mL/min — ABNORMAL LOW (ref >90)
GFR, EST AFRICAN AMERICAN: 45 mL/min — AB (ref >90)
Glucose, Bld: 127 mg/dL — ABNORMAL HIGH (ref 70–99)
POTASSIUM: 3.9 meq/L (ref 3.7–5.3)
Sodium: 140 mEq/L (ref 137–147)

## 2013-09-16 LAB — CBC
HCT: 32.2 % — ABNORMAL LOW (ref 39.0–52.0)
HEMOGLOBIN: 10.3 g/dL — AB (ref 13.0–17.0)
MCH: 31.8 pg (ref 26.0–34.0)
MCHC: 32 g/dL (ref 30.0–36.0)
MCV: 99.4 fL (ref 78.0–100.0)
Platelets: 227 10*3/uL (ref 150–400)
RBC: 3.24 MIL/uL — ABNORMAL LOW (ref 4.22–5.81)
RDW: 14.4 % (ref 11.5–15.5)
WBC: 7 10*3/uL (ref 4.0–10.5)

## 2013-09-16 LAB — GLUCOSE, CAPILLARY: Glucose-Capillary: 130 mg/dL — ABNORMAL HIGH (ref 70–99)

## 2013-09-16 NOTE — Progress Notes (Signed)
Discharge instructions given and understood by patient and wife. No new meds. Dressing change performed and explained to wife and patient. Follow up appointments made. Lavender and saline mixed for wraps for scrotum and penis edema. Explained to elevate scrotum on a rolled towel and wrap with gauze soaked in lavender solution a as needed but to call the doctor if swelling increases. Patient left in stable condition with wife via wheelchair in personal vehicle.

## 2013-09-16 NOTE — Discharge Instructions (Signed)
Open Hernia Repair, Care After °Refer to this sheet in the next few weeks. These instructions provide you with information on caring for yourself after your procedure. Your health care provider may also give you more specific instructions. Your treatment has been planned according to current medical practices, but problems sometimes occur. Call your health care provider if you have any problems or questions after your procedure. °WHAT TO EXPECT AFTER THE PROCEDURE °After your procedure, it is typical to have the following: °· Pain in your abdomen, especially along your incision. You will be given pain medicines to control the pain. °· Constipation. You may be given a stool softener to help prevent this. °HOME CARE INSTRUCTIONS  °· Only take over-the-counter or prescription medicines as directed by your health care provider. °· Keep the wound dry and clean. You may wash the wound gently with soap and water 48 hours after surgery. Gently blot or dab the wound dry. Do not take baths, use swimming pools, or use hot tubs for 10 days or until your health care provider approves. °· Change bandages (dressings) as directed by your health care provider. °· Continue your normal diet as directed by your health care provider. Eat plenty of fruits and vegetables to help prevent constipation. °· Drink enough fluids to keep your urine clear or pale yellow. This also helps prevent constipation. °· Do not drive until your health care provider says it is okay. °· Do not lift anything heavier than 10 pounds (4.5 kg) or play contact sports for 4 weeks or until your health care provider approves. °· Follow up with your health care provider as directed. Ask your health care provider when to make an appointment to have your stitches (sutures) or staples removed. °SEEK MEDICAL CARE IF:  °· You have increased bleeding coming from the incision site. °· You have blood in your stool. °· You have increasing pain in the wound. °· You see redness  or swelling in the wound. °· You have fluid (pus) coming from the wound. °· You have a fever. °· You notice a bad smell coming from the wound or dressing. °SEEK IMMEDIATE MEDICAL CARE IF:  °· You develop a rash. °· You have chest pain or shortness of breath. °· You feel lightheaded or feel faint. °Document Released: 02/23/2005 Document Revised: 05/27/2013 Document Reviewed: 03/18/2013 °ExitCare® Patient Information ©2014 [employer]. ° ° ° ° °

## 2013-09-16 NOTE — Discharge Summary (Signed)
Physician Discharge Summary  Patient ID: AVRY ROEDL MRN: [id number] DOB/AGE: [birth year] 78 y.o.  Admit date: 09/14/2013 Discharge date: 09/16/2013  Admission Diagnoses: Incarcerated ventral herniorrhaphy, renal insufficiency  Discharge Diagnoses: Same Active Problems:   Ventral hernia with obstruction   Discharged Condition: good  Hospital Course: Patient is a 80 year old white male who presented to same day surgery for a ventral herniorrhaphy with mesh. The patient had previously present in the hospital with dehydration renal insufficiency secondary to partial obstruction from a incarcerated right spigelian hernia. A cardiac workup had been performed and the patient was cleared for surgery. He underwent an incarcerated ventral herniorrhaphy with mesh on 09/14/2013. He tolerated the procedure well. Postoperative course has been for the most part unremarkable. His diet was advanced at difficulty. Physical therapy was consulted due to the patient's unsteady gait preoperatively. He is being discharged home with home health assistance in good improving condition. His renal function is noted to be baseline.  Treatments: surgery: Incarcerated ventral herniorrhaphy with mesh on 09/14/2013  Discharge Exam: Blood pressure 163/123, pulse 85, temperature 98.8 F (37.1 C), temperature source Oral, resp. rate 24, height 5' 11" (1.803 m), weight 124.1 kg (273 lb 9.5 oz), SpO2 96.00%. General appearance: alert, cooperative and no distress Resp: clear to auscultation bilaterally Cardio: Occasional PVCs. No S3 or S4 noted. GI: Soft, and nontender, nondistended. Incision line healing well. Abdominal binder in place.  Disposition: 01-Home or Self Care   Future Appointments Provider Department Dept Phone   10/02/2013 10:00 AM [facility] [phone]       Medication List         aspirin EC 81 MG tablet  Take 81 mg by mouth every 3 (three) days.      atorvastatin 20 MG tablet  Commonly known as:  LIPITOR  Take 20 mg by mouth daily.     carvedilol 3.125 MG tablet  Commonly known as:  COREG  Take 1 tablet (3.125 mg total) by mouth 2 (two) times daily with a meal.     pioglitazone-metformin 15-500 MG per tablet  Commonly known as:  ACTOPLUS MET  Take 1 tablet by mouth 2 (two) times daily with a meal.           Follow-up Information   Follow up with [facility].   Contact information:   [place] [address] [phone]      Follow up with Jamesetta So, MD. Schedule an appointment as soon as possible for a visit on 09/29/2013.   Specialty:  General Surgery   Contact information:   [address] [address] [phone]       Signed: Aviva Signs A 09/16/2013, 10:39 AM

## 2013-10-02 ENCOUNTER — Ambulatory Visit (INDEPENDENT_AMBULATORY_CARE_PROVIDER_SITE_OTHER): Payer: Medicare Other | Admitting: Gastroenterology

## 2013-10-02 ENCOUNTER — Encounter: Payer: Self-pay | Admitting: Gastroenterology

## 2013-10-02 VITALS — BP 90/60 | HR 65 | Temp 97.6°F | Ht 71.0 in | Wt 247.4 lb

## 2013-10-02 DIAGNOSIS — R634 Abnormal weight loss: Secondary | ICD-10-CM

## 2013-10-02 DIAGNOSIS — K5289 Other specified noninfective gastroenteritis and colitis: Secondary | ICD-10-CM

## 2013-10-02 DIAGNOSIS — D638 Anemia in other chronic diseases classified elsewhere: Secondary | ICD-10-CM

## 2013-10-02 DIAGNOSIS — R6881 Early satiety: Secondary | ICD-10-CM

## 2013-10-02 DIAGNOSIS — K529 Noninfective gastroenteritis and colitis, unspecified: Secondary | ICD-10-CM

## 2013-10-02 NOTE — Assessment & Plan Note (Signed)
Significant unintentional weight loss in the setting of bowel obstruction, acute on chronic renal insufficiency, peripheral edema which is now being managed, surgery. His wife states he continues to lose about 1 pound per day at this point. 33 pound weight loss at this time. Unclear how much of this was peripheral edema however. He describes significant early satiety, prefers to have liquid diet only. Slowly has been able to start back on solid foods but only small portions at a time. Encouraged frequent small meals daily 6 daily. Continue to monitor for further weight loss. If greater than 5 pounds patient will call. We will make sure his thyroid function has been checked recently. We have called Dr. Luan Pulling to verify this. Return to the office to see Dr. Gala Romney in the next few weeks to followup on weight loss, determine if any endoscopic evaluation be necessary.

## 2013-10-02 NOTE — Assessment & Plan Note (Signed)
Abnormal CT findings back in December, at that time felt to be infectious etiology rather than related to hernia and ischemia. Followup CT in January showed no more abnormalities in the colon. Clinically patient has returned to baseline with regards to his bowel function.

## 2013-10-02 NOTE — Patient Instructions (Signed)
1. We will see if your thyroid function has been checked recently, if not we will obtain those labs. 2. Please consume 5-6 small meals daily in order to increase your caloric intake. 3. Call if you lose more than 5 more pounds. 4. Office visit in 3 weeks with Dr. Gala Romney.

## 2013-10-02 NOTE — Assessment & Plan Note (Addendum)
Anemia chronic disease dating back at least 3 years, has been very stable. Recent anemia panel consistent with this. The patient denies prior colonoscopy or upper endoscopy. No family history of colon cancer. No overt GI bleeding. Hemoccult status unknown. Given his age, at this point without any significant changes in his hemoglobin, colonoscopy not clearly indicated. At this point, given recent surgery, would not pursue elective colonoscopy but can readdress at his next office visit.

## 2013-10-02 NOTE — Progress Notes (Signed)
Primary Care Physician:  HAWKINS,EDWARD L, MD  Primary Gastroenterologist:  Michael Rourk, MD   Chief Complaint  Patient presents with  . Follow-up    HPI:  Greg [name] is a 81 y.o. male here for further evaluation of recent colitis. We received phone call by Dr. Hawkins back on 08/19/2013 for abnormal CT scan. Suspected to have enterocolitis related to infectious etiology. The cecum, proximal descending colon and distal/terminal ileum appeared inflamed with inflamed appearing terminal distal ileum contained within the right ventrolateral hernia. Slightly featureless appearance of the rectosigmoid colon may reflect chronic colitis. Noted to have interval enlargement of her right ventral lateral (Spegelian) hernia containing small bowel. Patient initially started on Cipro and Flagyl. Prior to developing diarrhea he had had a period of abdominal distention associated with pain and inability to have a bowel movement. This was followed by profuse diarrhea which lasted for several days to a week. Symptoms had improved on oral antibiotics for suspected infectious colitis. Once the antibiotics were completed he almost immediately began having abdominal pain, vomiting and diarrhea again. He was admitted to the hospital January 14 and had a repeat CT scan. At that time he was noted to have a small bowel obstruction related to the Spegelian hernia. There was no evidence of bowel necrosis or perforation. No evidence of colitis at this point. Patient treated conservatively initially with acute on chronic renal failure. He ultimately underwent outpatient surgery on 09/14/2013 with ventral hernia repair, mesh insertion.  Patient reports 30+ pound weight loss during this illness. He also had had fluid retention associated with it as well. From mid December he basically consuming liquid diet only up until the past one week. He can only eat a small amount at a time. Describes early satiety. No nausea or vomiting at  this point. Denies any abdominal pain. BM one to twice per day. Formed. No brbpr, melena. Gradually increasing oral intake. Never vomited. No nausea, heartburn, indigestion. No dysphagia. He is worried about the new blood pressure medication he was started on. His blood pressure has been low normal but denies any dizziness or lightheadedness  Current Outpatient Prescriptions  Medication Sig Dispense Refill  . aspirin EC 81 MG tablet Take 81 mg by mouth every 3 (three) days.      . atorvastatin (LIPITOR) 20 MG tablet Take 20 mg by mouth daily.      . carvedilol (COREG) 3.125 MG tablet Take 1 tablet (3.125 mg total) by mouth 2 (two) times daily with a meal.  60 tablet  12  . furosemide (LASIX) 40 MG tablet Take 40 mg by mouth daily.       . pioglitazone-metformin (ACTOPLUS MET) 15-500 MG per tablet Take 1 tablet by mouth 2 (two) times daily with a meal.       No current facility-administered medications for this visit.    Allergies as of 10/02/2013 - Review Complete 10/02/2013  Allergen Reaction Noted  . Celery oil  09/03/2013    Past Medical History  Diagnosis Date  . Diabetes mellitus   . Anemia of chronic disease   . Leukopenia     Intermittent  . Mild renal insufficiency   . Herpes zoster     in the past  . Obesity   . Exposure to hazardous substance   . Anemia of chronic disease 02/10/2011  . Intermittent Leukopenia 02/10/2011  . Renal insufficiency 02/26/2011  . Hypertension   . Sleep apnea     Sleep Apnea score of 7      Past Surgical History  Procedure Laterality Date  . Nasal polyp surgery    . Ventral hernia repair N/A 09/14/2013    Procedure: HERNIA REPAIR VENTRAL ADULT WITH MESH;  Surgeon: Jamesetta So, MD;  Location: [facility];  Service: General;  Laterality: N/A;  . Insertion of mesh N/A 09/14/2013    Procedure: INSERTION OF MESH;  Surgeon: Jamesetta So, MD;  Location: [facility];  Service: General;  Laterality: N/A;    Family History  Problem Relation Age of Onset  .  Heart attack Brother     CABG  . Diabetes Brother   . Stroke Father   . Heart Problems Mother     History   Social History  . Marital Status: Married    Spouse Name: N/A    Number of Children: N/A  . Years of Education: N/A   Occupational History  . Truck Geophysicist/field seismologist     Retired   Social History Main Topics  . Smoking status: Former Smoker -- 0.00 packs/day for 25 years    Types: Cigars  . Smokeless tobacco: Current User    Types: Chew  . Alcohol Use: No  . Drug Use: No  . Sexual Activity: Not Currently   Other Topics Concern  . Not on file   Social History Narrative  . No narrative on file      ROS:  General: Negative for anorexia,  fever, chills. See history of present illness Eyes: Negative for vision changes.  ENT: Negative for hoarseness, difficulty swallowing , nasal congestion. CV: Negative for chest pain, angina, palpitations, dyspnea on exertion.+ peripheral edema, but improved.  Respiratory: Negative for dyspnea at rest, dyspnea on exertion, cough, sputum, wheezing.  GI: See history of present illness. GU:  Negative for dysuria, hematuria, urinary incontinence, urinary frequency, nocturnal urination.  MS: Negative for joint pain, low back pain.  Derm: Negative for rash or itching.  Neuro: Negative for weakness, abnormal sensation, seizure, frequent headaches, memory loss, confusion.  Psych: Negative for anxiety, depression, suicidal ideation, hallucinations.  Endo: See history of present illness Heme: Negative for bruising or bleeding. Allergy: Negative for rash or hives.    Physical Examination:  BP 90/60  Pulse 65  Temp(Src) 97.6 F (36.4 C) (Oral)  Ht 5' 11" (1.803 m)  Wt 247 lb 6.4 oz (112.22 kg)  BMI 34.52 kg/m2   General: Well-nourished, well-developed in no acute distress. Accompanied by wife Head: Normocephalic, atraumatic.   Eyes: Conjunctiva pink, no icterus. Mouth: Oropharyngeal mucosa moist and pink , no lesions erythema or  exudate. Neck: Supple without thyromegaly, masses, or lymphadenopathy.  Lungs: Clear to auscultation bilaterally.  Heart: Regular rate and rhythm, no murmurs rubs or gallops.  Abdomen: Bowel sounds are normal, nontender, nondistended, no hepatosplenomegaly or masses, no abdominal bruits or    hernia , no rebound or guarding.  Right lateral abdominal wall incision, well healing, no erythema or drainage. Rectal: Not performed Extremities: Trace bilateral lower extremity edema. No clubbing or deformities.  Neuro: Alert and oriented x 4 , grossly normal neurologically.  Skin: Warm and dry, no rash or jaundice.   Psych: Alert and cooperative, normal mood and affect.  Labs: Lab Results  Component Value Date   WBC 7.0 09/16/2013   HGB 10.3* 09/16/2013   HCT 32.2* 09/16/2013   MCV 99.4 09/16/2013   PLT 227 09/16/2013   Lab Results  Component Value Date   IRON 49 09/05/2013   TIBC 207* 09/05/2013   FERRITIN 88 09/05/2013  Lab Results  Component Value Date   VITAMINB12 283 08/25/2010   Lab Results  Component Value Date   FOLATE  Value: 11.1 (NOTE)  Reference Ranges        Deficient:       0.4 - 3.3 ng/mL        Indeterminate:   3.4 - 5.4 ng/mL        Normal:              > 5.4 ng/mL 08/25/2010   Lab Results  Component Value Date   CREATININE 1.61* 09/16/2013   BUN 19 09/16/2013   NA 140 09/16/2013   K 3.9 09/16/2013   CL 101 09/16/2013   CO2 26 09/16/2013   Lab Results  Component Value Date   ALT <5 09/02/2013   AST 10 09/02/2013   ALKPHOS 37* 09/02/2013   BILITOT 0.2* 09/02/2013     Imaging Studies: Ct Abdomen Pelvis Wo Contrast  09/03/2013   CLINICAL DATA:  Colitis.  EXAM: CT ABDOMEN AND PELVIS WITHOUT CONTRAST  TECHNIQUE: Multidetector CT imaging of the abdomen and pelvis was performed following the standard protocol without intravenous contrast.  COMPARISON:  08/19/2013  FINDINGS: BODY WALL: Spigelian hernia on the right, containing small bowel loops (ileum). Related findings below. There  is bilateral fatty inguinal hernias and a small supraumbilical midline abdominal wall hernia.  LOWER CHEST: Cardiomegaly.  ABDOMEN/PELVIS:  Liver: No focal abnormality.  Biliary: No evidence of biliary obstruction or stone.  Pancreas: Unremarkable.  Spleen: Unremarkable.  Adrenals: Unremarkable.  Kidneys and ureters: Bilateral renal atrophy.  No hydronephrosis.  Bladder: Unremarkable for degree of distention.  Reproductive: Unremarkable for age.  Bowel: As above, there is a right lower Spigelian hernia which contains ileal loops. The loops are distended, and only partially fill with contrast. The contrast show an abrupt contrast density difference at the level of hernia entry. There is new ascites around the distended and fluid-filled loops. The terminal ileum beyond the hernia is fluid-filled or thickened. here is still some fluid stool present within the colon. Distal colonic diverticulosis. Normal appendix.  Retroperitoneum: No mass or adenopathy.  Peritoneum: No pneumoperitoneum.  Vascular: No acute abnormality.  OSSEOUS: No acute abnormalities.  These results were called by telephone at the time of interpretation on 09/03/2013 at 2:32 AM to Dr. EDWARD HAWKINS , who verbally acknowledged these results.  IMPRESSION: 1. Small bowel obstruction secondary to right-sided Spigelian hernia. No evidence of bowel necrosis or perforation, but is reactive mesenteric edema and ascites. 2. No evidence of colitis.   Electronically Signed   By: Jonathan  Watts M.D.   On: 09/03/2013 02:34   X-ray Chest Pa And Lateral   09/03/2013   CLINICAL DATA:  Colitis.  EXAM: CHEST  2 VIEW  COMPARISON:  08/25/2010  FINDINGS: Chronic cardiomegaly. No consolidation or edema. No effusion or pneumothorax. No acute osseous findings.  IMPRESSION: No active cardiopulmonary disease.   Electronically Signed   By: Jonathan  Watts M.D.   On: 09/03/2013 02:34      

## 2013-10-05 NOTE — Progress Notes (Signed)
cc'd to pcp 

## 2013-10-14 NOTE — Addendum Note (Signed)
Addended by: Mahala Menghini on: 10/14/2013 04:32 PM   Modules accepted: Orders

## 2013-10-14 NOTE — Progress Notes (Signed)
Labs from 08/18/2013. Hemoglobin and 10, hematocrit 30.3, platelets 163,000, BUN 25, creatinine 1.95, total bilirubin 0.6, alkaline phosphatase 39, AST 18, ALT less than 8, albumin 3.25 June 2013, hemoglobin A1c 6.3.  TSH not performed.  Let's get patient go have TSH given significant weight loss. Keep upcoming [place] 10/2013.

## 2013-10-21 LAB — CBC
ALT: 8 U/L — AB (ref 10–40)
AST: 18 U/L
Albumin: 3.6
Alkaline Phosphatase: 39 U/L (ref 25–125)
BUN: 25 mg/dL — AB (ref 4–21)
Creat: 1.95
GLUCOSE: 123 mg/dL
HCT: 30 %
HGB: 10 g/dL
Total Bilirubin: 0.6 mg/dL

## 2013-10-21 LAB — HEMOGLOBIN A1C: Hgb A1c MFr Bld: 6.3 % — AB (ref 4.0–6.0)

## 2013-10-22 NOTE — Progress Notes (Signed)
Called pt, informed him that we needed TSH, he and his wife both said that Dr. Luan Pulling did a TSH and we needed to request it again.  Chelsey, please request TSH from Dr. Luan Pulling office. Thanks.

## 2013-10-26 NOTE — Progress Notes (Signed)
Records in your folder. LSL

## 2013-10-26 NOTE — Progress Notes (Signed)
Requested TSH

## 2013-11-02 NOTE — Progress Notes (Signed)
Labs from 10/12/2013, hemoglobin 10, hematocrit 31.9, creatinine 2.01, BUN 49, TSH 34.212.  Please find out if patient was started on any thyroid medication by Dr. Luan Pulling. TSH is very abnormal.  Keep office visit with Dr. Gala Romney is planned.

## 2013-11-03 ENCOUNTER — Telehealth: Payer: Self-pay | Admitting: Internal Medicine

## 2013-11-03 ENCOUNTER — Ambulatory Visit: Payer: Medicare Other | Admitting: Internal Medicine

## 2013-11-03 NOTE — Telephone Encounter (Signed)
Pt was a no show

## 2013-11-03 NOTE — Progress Notes (Signed)
Pt is scheduled for ov today with RMR

## 2013-11-24 LAB — CBC
BUN: 49 mg/dL — AB (ref 4–21)
CREATININE: 2.01
HCT: 32 %
HGB: 10 g/dL
TSH: 34.21 u[IU]/mL — AB (ref 0.41–5.90)

## 2014-08-16 ENCOUNTER — Ambulatory Visit (HOSPITAL_COMMUNITY)
Admission: RE | Admit: 2014-08-16 | Discharge: 2014-08-16 | Disposition: A | Discharge status: Home / Self Care | Payer: Medicare Other | Source: Ambulatory Visit | Attending: Pulmonary Disease | Admitting: Pulmonary Disease

## 2014-08-16 ENCOUNTER — Other Ambulatory Visit (HOSPITAL_COMMUNITY): Payer: Self-pay | Admitting: Pulmonary Disease

## 2014-08-16 DIAGNOSIS — R2241 Localized swelling, mass and lump, right lower limb: Secondary | ICD-10-CM | POA: Diagnosis not present

## 2014-08-16 DIAGNOSIS — M79604 Pain in right leg: Secondary | ICD-10-CM | POA: Insufficient documentation

## 2014-08-16 DIAGNOSIS — R600 Localized edema: Secondary | ICD-10-CM | POA: Diagnosis present

## 2014-08-25 ENCOUNTER — Encounter (HOSPITAL_COMMUNITY)
Admission: RE | Admit: 2014-08-25 | Discharge: 2014-08-25 | Disposition: A | Discharge status: Home / Self Care | Payer: Medicare Other | Source: Ambulatory Visit | Attending: Pulmonary Disease | Admitting: Pulmonary Disease

## 2014-08-25 DIAGNOSIS — D649 Anemia, unspecified: Secondary | ICD-10-CM | POA: Diagnosis present

## 2014-08-25 LAB — HEMOGLOBIN AND HEMATOCRIT, BLOOD
HCT: 20.2 % — ABNORMAL LOW (ref 39.0–52.0)
HEMOGLOBIN: 5.9 g/dL — AB (ref 13.0–17.0)

## 2014-08-25 LAB — PREPARE RBC (CROSSMATCH)

## 2014-08-26 ENCOUNTER — Encounter (HOSPITAL_COMMUNITY)
Admission: RE | Admit: 2014-08-26 | Discharge: 2014-08-26 | Disposition: A | Discharge status: Home / Self Care | Payer: Medicare Other | Source: Ambulatory Visit | Attending: Pulmonary Disease | Admitting: Pulmonary Disease

## 2014-08-26 DIAGNOSIS — D649 Anemia, unspecified: Secondary | ICD-10-CM | POA: Diagnosis not present

## 2014-08-26 LAB — HEMOGLOBIN AND HEMATOCRIT, BLOOD
HEMATOCRIT: 24.4 % — AB (ref 39.0–52.0)
HEMOGLOBIN: 7.4 g/dL — AB (ref 13.0–17.0)

## 2014-08-26 LAB — IRON AND TIBC
Iron: 75 ug/dL (ref 42–165)
Saturation Ratios: 20 % (ref 20–55)
TIBC: 374 ug/dL (ref 215–435)
UIBC: 299 ug/dL (ref 125–400)

## 2014-08-26 LAB — FERRITIN: Ferritin: 14 ng/mL — ABNORMAL LOW (ref 22–322)

## 2014-08-26 MED ORDER — SODIUM CHLORIDE 0.9 % IV SOLN
Freq: Once | INTRAVENOUS | Status: AC
  Administered 2014-08-26: 250 mL via INTRAVENOUS

## 2014-08-27 LAB — TYPE AND SCREEN
ABO/RH(D): A POS
Antibody Screen: NEGATIVE
UNIT DIVISION: 0
Unit division: 0

## 2014-08-27 NOTE — Progress Notes (Signed)
Results for ASEEM, SESSUMS (MRN [id number]) as of 08/27/2014 07:51  Ref. Range 08/26/2014 12:20  Iron Latest Range: 42-165 ug/dL 75  UIBC Latest Range: 125-400 ug/dL 299  TIBC Latest Range: 215-435 ug/dL 374  Saturation Ratios Latest Range: 20-55 % 20  Ferritin Latest Range: 22-322 ng/mL 14 (L)  Hemoglobin Latest Range: 13.0-17.0 g/dL 7.4 (L)  HCT Latest Range: 39.0-52.0 % 24.4 (L)

## 2014-09-17 ENCOUNTER — Encounter (HOSPITAL_COMMUNITY)
Admission: RE | Admit: 2014-09-17 | Discharge: 2014-09-17 | Disposition: A | Discharge status: Home / Self Care | Payer: Medicare Other | Source: Ambulatory Visit | Attending: Pulmonary Disease | Admitting: Pulmonary Disease

## 2014-09-17 DIAGNOSIS — D649 Anemia, unspecified: Secondary | ICD-10-CM | POA: Diagnosis not present

## 2014-09-17 LAB — PREPARE RBC (CROSSMATCH)

## 2014-09-17 LAB — HEMOGLOBIN AND HEMATOCRIT, BLOOD
HEMATOCRIT: 23.8 % — AB (ref 39.0–52.0)
HEMOGLOBIN: 7 g/dL — AB (ref 13.0–17.0)

## 2014-09-17 MED ORDER — SODIUM CHLORIDE 0.9 % IV SOLN: Freq: Once | INTRAVENOUS | Status: DC

## 2014-09-17 NOTE — Progress Notes (Signed)
Results for Greg [name], Greg [name] (MRN [id number]) as of 09/17/2014 09:53  Ref. Range 09/17/2014 07:30  Hemoglobin Latest Range: 13.0-17.0 g/dL 7.0 (L)  HCT Latest Range: 39.0-52.0 % 23.8 (L)

## 2014-09-18 LAB — TYPE AND SCREEN
ABO/RH(D): A POS
Antibody Screen: NEGATIVE
Unit division: 0
Unit division: 0

## 2014-09-20 ENCOUNTER — Telehealth: Payer: Self-pay | Admitting: Gastroenterology

## 2014-09-20 NOTE — Telephone Encounter (Signed)
APPOINTMENT MADE FOR 09/22/14 AND PATIENT AWARE OF DATE AND TIME

## 2014-09-20 NOTE — Telephone Encounter (Signed)
CALLED BY DR. Luan Pulling. PT HAS A TRANSFUSION DEPENDENT ANEMIA. NEEDS OPV ASAP.

## 2014-09-20 NOTE — Telephone Encounter (Signed)
Let's get him in ASAP  - with extender

## 2014-09-22 ENCOUNTER — Encounter (HOSPITAL_COMMUNITY): Payer: Self-pay | Admitting: Cardiology

## 2014-09-22 ENCOUNTER — Emergency Department (HOSPITAL_COMMUNITY): Payer: Medicare Other

## 2014-09-22 ENCOUNTER — Inpatient Hospital Stay (HOSPITAL_COMMUNITY)
Admission: EM | Admit: 2014-09-22 | Discharge: 2014-10-16 | DRG: 264 | Disposition: A | Discharge status: Skilled Nursing Facility | Payer: Medicare Other | Attending: Internal Medicine | Admitting: Internal Medicine

## 2014-09-22 ENCOUNTER — Ambulatory Visit: Payer: Medicare Other | Admitting: Gastroenterology

## 2014-09-22 DIAGNOSIS — D638 Anemia in other chronic diseases classified elsewhere: Secondary | ICD-10-CM | POA: Diagnosis present

## 2014-09-22 DIAGNOSIS — I129 Hypertensive chronic kidney disease with stage 1 through stage 4 chronic kidney disease, or unspecified chronic kidney disease: Secondary | ICD-10-CM | POA: Diagnosis present

## 2014-09-22 DIAGNOSIS — W19XXXA Unspecified fall, initial encounter: Secondary | ICD-10-CM

## 2014-09-22 DIAGNOSIS — E876 Hypokalemia: Secondary | ICD-10-CM | POA: Diagnosis not present

## 2014-09-22 DIAGNOSIS — I82621 Acute embolism and thrombosis of deep veins of right upper extremity: Secondary | ICD-10-CM | POA: Diagnosis present

## 2014-09-22 DIAGNOSIS — D62 Acute posthemorrhagic anemia: Secondary | ICD-10-CM | POA: Diagnosis present

## 2014-09-22 DIAGNOSIS — K269 Duodenal ulcer, unspecified as acute or chronic, without hemorrhage or perforation: Secondary | ICD-10-CM | POA: Diagnosis present

## 2014-09-22 DIAGNOSIS — K5790 Diverticulosis of intestine, part unspecified, without perforation or abscess without bleeding: Secondary | ICD-10-CM | POA: Diagnosis present

## 2014-09-22 DIAGNOSIS — E43 Unspecified severe protein-calorie malnutrition: Secondary | ICD-10-CM | POA: Diagnosis present

## 2014-09-22 DIAGNOSIS — J9811 Atelectasis: Secondary | ICD-10-CM | POA: Diagnosis not present

## 2014-09-22 DIAGNOSIS — E669 Obesity, unspecified: Secondary | ICD-10-CM | POA: Diagnosis present

## 2014-09-22 DIAGNOSIS — E875 Hyperkalemia: Secondary | ICD-10-CM | POA: Diagnosis present

## 2014-09-22 DIAGNOSIS — R571 Hypovolemic shock: Secondary | ICD-10-CM | POA: Diagnosis not present

## 2014-09-22 DIAGNOSIS — K648 Other hemorrhoids: Secondary | ICD-10-CM | POA: Diagnosis present

## 2014-09-22 DIAGNOSIS — K6389 Other specified diseases of intestine: Secondary | ICD-10-CM

## 2014-09-22 DIAGNOSIS — F17220 Nicotine dependence, chewing tobacco, uncomplicated: Secondary | ICD-10-CM | POA: Diagnosis present

## 2014-09-22 DIAGNOSIS — I248 Other forms of acute ischemic heart disease: Secondary | ICD-10-CM | POA: Diagnosis present

## 2014-09-22 DIAGNOSIS — I481 Persistent atrial fibrillation: Secondary | ICD-10-CM

## 2014-09-22 DIAGNOSIS — Z978 Presence of other specified devices: Secondary | ICD-10-CM

## 2014-09-22 DIAGNOSIS — C183 Malignant neoplasm of hepatic flexure: Secondary | ICD-10-CM | POA: Diagnosis present

## 2014-09-22 DIAGNOSIS — E039 Hypothyroidism, unspecified: Secondary | ICD-10-CM | POA: Diagnosis present

## 2014-09-22 DIAGNOSIS — R0602 Shortness of breath: Secondary | ICD-10-CM

## 2014-09-22 DIAGNOSIS — N2581 Secondary hyperparathyroidism of renal origin: Secondary | ICD-10-CM | POA: Diagnosis present

## 2014-09-22 DIAGNOSIS — I5041 Acute combined systolic (congestive) and diastolic (congestive) heart failure: Secondary | ICD-10-CM | POA: Diagnosis present

## 2014-09-22 DIAGNOSIS — Y92009 Unspecified place in unspecified non-institutional (private) residence as the place of occurrence of the external cause: Secondary | ICD-10-CM

## 2014-09-22 DIAGNOSIS — S0990XA Unspecified injury of head, initial encounter: Secondary | ICD-10-CM | POA: Diagnosis present

## 2014-09-22 DIAGNOSIS — K5669 Other intestinal obstruction: Secondary | ICD-10-CM | POA: Diagnosis present

## 2014-09-22 DIAGNOSIS — R778 Other specified abnormalities of plasma proteins: Secondary | ICD-10-CM | POA: Diagnosis present

## 2014-09-22 DIAGNOSIS — S01312A Laceration without foreign body of left ear, initial encounter: Secondary | ICD-10-CM | POA: Diagnosis present

## 2014-09-22 DIAGNOSIS — R579 Shock, unspecified: Secondary | ICD-10-CM

## 2014-09-22 DIAGNOSIS — I429 Cardiomyopathy, unspecified: Secondary | ICD-10-CM | POA: Insufficient documentation

## 2014-09-22 DIAGNOSIS — N184 Chronic kidney disease, stage 4 (severe): Secondary | ICD-10-CM | POA: Diagnosis present

## 2014-09-22 DIAGNOSIS — I509 Heart failure, unspecified: Secondary | ICD-10-CM

## 2014-09-22 DIAGNOSIS — E1129 Type 2 diabetes mellitus with other diabetic kidney complication: Secondary | ICD-10-CM | POA: Diagnosis present

## 2014-09-22 DIAGNOSIS — Z6826 Body mass index (BMI) 26.0-26.9, adult: Secondary | ICD-10-CM | POA: Diagnosis not present

## 2014-09-22 DIAGNOSIS — K222 Esophageal obstruction: Secondary | ICD-10-CM | POA: Diagnosis present

## 2014-09-22 DIAGNOSIS — J9601 Acute respiratory failure with hypoxia: Secondary | ICD-10-CM | POA: Diagnosis present

## 2014-09-22 DIAGNOSIS — G9341 Metabolic encephalopathy: Secondary | ICD-10-CM | POA: Diagnosis not present

## 2014-09-22 DIAGNOSIS — G4733 Obstructive sleep apnea (adult) (pediatric): Secondary | ICD-10-CM | POA: Diagnosis present

## 2014-09-22 DIAGNOSIS — R159 Full incontinence of feces: Secondary | ICD-10-CM | POA: Diagnosis not present

## 2014-09-22 DIAGNOSIS — K259 Gastric ulcer, unspecified as acute or chronic, without hemorrhage or perforation: Secondary | ICD-10-CM | POA: Diagnosis present

## 2014-09-22 DIAGNOSIS — I5043 Acute on chronic combined systolic (congestive) and diastolic (congestive) heart failure: Secondary | ICD-10-CM | POA: Diagnosis present

## 2014-09-22 DIAGNOSIS — K449 Diaphragmatic hernia without obstruction or gangrene: Secondary | ICD-10-CM | POA: Diagnosis present

## 2014-09-22 DIAGNOSIS — Z79899 Other long term (current) drug therapy: Secondary | ICD-10-CM

## 2014-09-22 DIAGNOSIS — C787 Secondary malignant neoplasm of liver and intrahepatic bile duct: Secondary | ICD-10-CM | POA: Diagnosis present

## 2014-09-22 DIAGNOSIS — I4892 Unspecified atrial flutter: Secondary | ICD-10-CM | POA: Diagnosis present

## 2014-09-22 DIAGNOSIS — Z7982 Long term (current) use of aspirin: Secondary | ICD-10-CM

## 2014-09-22 DIAGNOSIS — I4891 Unspecified atrial fibrillation: Secondary | ICD-10-CM

## 2014-09-22 DIAGNOSIS — N289 Disorder of kidney and ureter, unspecified: Secondary | ICD-10-CM

## 2014-09-22 DIAGNOSIS — K921 Melena: Secondary | ICD-10-CM | POA: Diagnosis present

## 2014-09-22 DIAGNOSIS — R609 Edema, unspecified: Secondary | ICD-10-CM

## 2014-09-22 DIAGNOSIS — N179 Acute kidney failure, unspecified: Secondary | ICD-10-CM

## 2014-09-22 DIAGNOSIS — K279 Peptic ulcer, site unspecified, unspecified as acute or chronic, without hemorrhage or perforation: Secondary | ICD-10-CM | POA: Diagnosis present

## 2014-09-22 DIAGNOSIS — N189 Chronic kidney disease, unspecified: Secondary | ICD-10-CM | POA: Insufficient documentation

## 2014-09-22 DIAGNOSIS — K761 Chronic passive congestion of liver: Secondary | ICD-10-CM | POA: Diagnosis present

## 2014-09-22 DIAGNOSIS — I447 Left bundle-branch block, unspecified: Secondary | ICD-10-CM | POA: Diagnosis present

## 2014-09-22 DIAGNOSIS — R945 Abnormal results of liver function studies: Secondary | ICD-10-CM

## 2014-09-22 DIAGNOSIS — R9431 Abnormal electrocardiogram [ECG] [EKG]: Secondary | ICD-10-CM | POA: Diagnosis present

## 2014-09-22 DIAGNOSIS — R7989 Other specified abnormal findings of blood chemistry: Secondary | ICD-10-CM

## 2014-09-22 DIAGNOSIS — R5381 Other malaise: Secondary | ICD-10-CM | POA: Diagnosis not present

## 2014-09-22 DIAGNOSIS — Z9289 Personal history of other medical treatment: Secondary | ICD-10-CM

## 2014-09-22 DIAGNOSIS — D500 Iron deficiency anemia secondary to blood loss (chronic): Secondary | ICD-10-CM | POA: Diagnosis present

## 2014-09-22 HISTORY — DX: Chronic kidney disease, stage 3 unspecified: N18.30

## 2014-09-22 HISTORY — DX: Cardiomyopathy, unspecified: I42.9

## 2014-09-22 HISTORY — DX: Essential (primary) hypertension: I10

## 2014-09-22 HISTORY — DX: Type 2 diabetes mellitus without complications: E11.9

## 2014-09-22 HISTORY — DX: Left bundle-branch block, unspecified: I44.7

## 2014-09-22 HISTORY — DX: Chronic kidney disease, stage 3 (moderate): N18.3

## 2014-09-22 LAB — CBC WITH DIFFERENTIAL/PLATELET
Basophils Absolute: 0 10*3/uL (ref 0.0–0.1)
Basophils Relative: 0 % (ref 0–1)
EOS ABS: 0 10*3/uL (ref 0.0–0.7)
Eosinophils Relative: 0 % (ref 0–5)
HCT: 30.6 % — ABNORMAL LOW (ref 39.0–52.0)
Hemoglobin: 8.9 g/dL — ABNORMAL LOW (ref 13.0–17.0)
LYMPHS ABS: 1 10*3/uL (ref 0.7–4.0)
Lymphocytes Relative: 14 % (ref 12–46)
MCH: 27.2 pg (ref 26.0–34.0)
MCHC: 29.1 g/dL — ABNORMAL LOW (ref 30.0–36.0)
MCV: 93.6 fL (ref 78.0–100.0)
MONOS PCT: 10 % (ref 3–12)
Monocytes Absolute: 0.7 10*3/uL (ref 0.1–1.0)
Neutro Abs: 5.2 10*3/uL (ref 1.7–7.7)
Neutrophils Relative %: 76 % (ref 43–77)
PLATELETS: 198 10*3/uL (ref 150–400)
RBC: 3.27 MIL/uL — AB (ref 4.22–5.81)
RDW: 17.5 % — ABNORMAL HIGH (ref 11.5–15.5)
WBC: 6.8 10*3/uL (ref 4.0–10.5)

## 2014-09-22 LAB — BASIC METABOLIC PANEL
Anion gap: 7 (ref 5–15)
BUN: 69 mg/dL — ABNORMAL HIGH (ref 6–23)
CHLORIDE: 114 mmol/L — AB (ref 96–112)
CO2: 22 mmol/L (ref 19–32)
Calcium: 8.7 mg/dL (ref 8.4–10.5)
Creatinine, Ser: 3.04 mg/dL — ABNORMAL HIGH (ref 0.50–1.35)
GFR calc Af Amer: 21 mL/min — ABNORMAL LOW (ref >90)
GFR, EST NON AFRICAN AMERICAN: 18 mL/min — AB (ref >90)
GLUCOSE: 147 mg/dL — AB (ref 70–99)
POTASSIUM: 5.3 mmol/L — AB (ref 3.5–5.1)
Sodium: 143 mmol/L (ref 135–145)

## 2014-09-22 LAB — COMPREHENSIVE METABOLIC PANEL
ALT: 143 U/L — ABNORMAL HIGH (ref 0–53)
AST: 175 U/L — ABNORMAL HIGH (ref 0–37)
Albumin: 3.4 g/dL — ABNORMAL LOW (ref 3.5–5.2)
Alkaline Phosphatase: 63 U/L (ref 39–117)
Anion gap: 8 (ref 5–15)
BUN: 68 mg/dL — ABNORMAL HIGH (ref 6–23)
CHLORIDE: 112 mmol/L (ref 96–112)
CO2: 21 mmol/L (ref 19–32)
Calcium: 8.5 mg/dL (ref 8.4–10.5)
Creatinine, Ser: 2.9 mg/dL — ABNORMAL HIGH (ref 0.50–1.35)
GFR calc Af Amer: 22 mL/min — ABNORMAL LOW (ref >90)
GFR, EST NON AFRICAN AMERICAN: 19 mL/min — AB (ref >90)
GLUCOSE: 175 mg/dL — AB (ref 70–99)
Potassium: 5.6 mmol/L — ABNORMAL HIGH (ref 3.5–5.1)
Sodium: 141 mmol/L (ref 135–145)
TOTAL PROTEIN: 6.7 g/dL (ref 6.0–8.3)
Total Bilirubin: 0.6 mg/dL (ref 0.3–1.2)

## 2014-09-22 LAB — PROTIME-INR
INR: 1.26 (ref 0.00–1.49)
Prothrombin Time: 16 seconds — ABNORMAL HIGH (ref 11.6–15.2)

## 2014-09-22 LAB — TROPONIN I
Troponin I: 0.28 ng/mL — ABNORMAL HIGH (ref <0.031)
Troponin I: 1.13 ng/mL (ref <0.031)
Troponin I: 1.33 ng/mL (ref <0.031)

## 2014-09-22 LAB — MRSA PCR SCREENING: MRSA by PCR: NEGATIVE

## 2014-09-22 LAB — BRAIN NATRIURETIC PEPTIDE: B Natriuretic Peptide: 1657 pg/mL — ABNORMAL HIGH (ref 0.0–100.0)

## 2014-09-22 MED ORDER — ACETAMINOPHEN 325 MG PO TABS: 650.0000 mg | ORAL_TABLET | ORAL | Status: DC | PRN

## 2014-09-22 MED ORDER — ASPIRIN EC 81 MG PO TBEC: 81.0000 mg | DELAYED_RELEASE_TABLET | ORAL | Status: DC

## 2014-09-22 MED ORDER — LEVOTHYROXINE SODIUM 125 MCG PO TABS
125.0000 ug | ORAL_TABLET | Freq: Every day | ORAL | Status: DC
  Administered 2014-09-23 – 2014-10-08 (×16): 125 ug via ORAL
  Filled 2014-09-22 (×31): qty 1

## 2014-09-22 MED ORDER — SODIUM CHLORIDE 0.9 % IJ SOLN
3.0000 mL | INTRAMUSCULAR | Status: DC | PRN
  Administered 2014-10-01 – 2014-10-02 (×2): 3 mL via INTRAVENOUS
  Filled 2014-09-22 (×2): qty 3

## 2014-09-22 MED ORDER — SODIUM CHLORIDE 0.9 % IV SOLN: 250.0000 mL | INTRAVENOUS | Status: DC | PRN

## 2014-09-22 MED ORDER — ONDANSETRON HCL 4 MG/2ML IJ SOLN
4.0000 mg | Freq: Four times a day (QID) | INTRAMUSCULAR | Status: DC | PRN
  Administered 2014-10-09: 4 mg via INTRAVENOUS
  Filled 2014-09-22: qty 2

## 2014-09-22 MED ORDER — FUROSEMIDE 10 MG/ML IJ SOLN
40.0000 mg | Freq: Once | INTRAMUSCULAR | Status: AC
  Administered 2014-09-22: 40 mg via INTRAVENOUS
  Filled 2014-09-22: qty 4

## 2014-09-22 MED ORDER — ASPIRIN 81 MG PO CHEW
324.0000 mg | CHEWABLE_TABLET | Freq: Once | ORAL | Status: AC
  Administered 2014-09-22: 324 mg via ORAL
  Filled 2014-09-22: qty 4

## 2014-09-22 MED ORDER — CARVEDILOL 3.125 MG PO TABS
3.1250 mg | ORAL_TABLET | Freq: Two times a day (BID) | ORAL | Status: DC
  Administered 2014-09-23 – 2014-09-27 (×9): 3.125 mg via ORAL
  Filled 2014-09-22 (×9): qty 1

## 2014-09-22 MED ORDER — CETYLPYRIDINIUM CHLORIDE 0.05 % MT LIQD
7.0000 mL | Freq: Two times a day (BID) | OROMUCOSAL | Status: DC
  Administered 2014-09-22 – 2014-10-07 (×31): 7 mL via OROMUCOSAL

## 2014-09-22 MED ORDER — ASPIRIN EC 81 MG PO TBEC
81.0000 mg | DELAYED_RELEASE_TABLET | Freq: Every day | ORAL | Status: DC
  Administered 2014-09-23 – 2014-09-24 (×2): 81 mg via ORAL
  Filled 2014-09-22 (×2): qty 1

## 2014-09-22 MED ORDER — ATORVASTATIN CALCIUM 20 MG PO TABS
20.0000 mg | ORAL_TABLET | Freq: Every day | ORAL | Status: DC
  Administered 2014-09-22 – 2014-10-07 (×16): 20 mg via ORAL
  Filled 2014-09-22 (×19): qty 1

## 2014-09-22 MED ORDER — FUROSEMIDE 10 MG/ML IJ SOLN
40.0000 mg | Freq: Two times a day (BID) | INTRAMUSCULAR | Status: DC
  Administered 2014-09-22 – 2014-09-23 (×2): 40 mg via INTRAVENOUS
  Filled 2014-09-22 (×2): qty 4

## 2014-09-22 NOTE — ED Notes (Addendum)
Dizzy this morning after going to bathroom.  Fell and hit the night stand.  Skin tear left shoulder.  Bruise to left side of head. Lac left ear.

## 2014-09-22 NOTE — ED Notes (Signed)
Hospitalist paged to give critical value Trop. 1.13.

## 2014-09-22 NOTE — ED Notes (Addendum)
Difficult to obtain O2 reading on pt. Pt's hands cold, pulse ox moved to ear.

## 2014-09-22 NOTE — H&P (Addendum)
History and Physical  Greg [name] YQM:[id number] DOB: 04-28-1932 DOA: 09/22/2014  Referring physician: Dr. Stark Jock PCP: [name], MD   Chief Complaint: fall at home  HPI:  81 year old man with history of transfusion dependent anemia of unclear etiology, combined systolic/diastolic dysfunction, diabetes mellitus presented to the emergency department after a fall at home with difficulty getting up afterwards. Also noted 2 week history of shortness of breath. Initial evaluation revealed acute hypoxic respiratory failure, elevated troponin, new diagnosis atrial fibrillation, acute on chronic renal failure, hyperkalemia and he was referred for admission.  Patient reports no known history of heart disease or long-term edema. The last 2 weeks he has had increasing lower extremity edema bilaterally with increased shortness of breath, worst in the morning. No associated chest pain. Over the last 3-5 days he has become much more short of breath. He has been able to recline and sleep flat. He notes difficulty walking, fatigue, coordination problems for the last week. Today he got up to go the bathroom when he lost his balance and fell. He had his head but did not pass out or lose consciousness.  He has a history of anemia of unclear etiology and received 2 transfusions in January. Outpatient evaluation with gastroenterology was planned for today.  In the emergency department blood pressure stable, heart rate variable 50-100, respiratory rate 20s, hypoxic. Potassium 5.6, creatinine 2.9, AST 175, ALT 143, troponin 0.28, BNP 1657. Hemoglobin 8.9. Chest x-ray showed right lower lobe atelectasis, right pleural effusion.  Review of Systems:  Negative for fever, visual changes, sore throat, rash, new muscle aches, chest pain, dysuria, bleeding, n/v/abdominal pain.  Past Medical History  Diagnosis Date  . Diabetes mellitus   . Anemia of chronic disease   . Leukopenia     Intermittent  . Mild renal  insufficiency   . Herpes zoster     in the past  . Obesity   . Exposure to hazardous substance   . Anemia of chronic disease 02/10/2011  . Intermittent Leukopenia 02/10/2011  . Renal insufficiency 02/26/2011  . Hypertension   . Sleep apnea     Sleep Apnea score of 7    Past Surgical History  Procedure Laterality Date  . Nasal polyp surgery    . Ventral hernia repair N/A 09/14/2013    Procedure: HERNIA REPAIR VENTRAL ADULT WITH MESH;  Surgeon: Jamesetta So, MD;  Location: [facility];  Service: General;  Laterality: N/A;  . Insertion of mesh N/A 09/14/2013    Procedure: INSERTION OF MESH;  Surgeon: Jamesetta So, MD;  Location: [facility];  Service: General;  Laterality: N/A;    Social History:  reports that he has quit smoking. His smoking use included Cigars. His smokeless tobacco use includes Chew. He reports that he does not drink alcohol or use illicit drugs.  Allergies  Allergen Reactions  . Celery Oil     vomiting    Family History  Problem Relation Age of Onset  . Heart attack Brother     CABG  . Diabetes Brother   . Stroke Father   . Heart Problems Mother   . Colon cancer Neg Hx      Prior to Admission medications   Medication Sig Start Date End Date Taking? Authorizing Provider  atorvastatin (LIPITOR) 20 MG tablet Take 20 mg by mouth daily.   Yes Historical Provider, MD  levothyroxine (SYNTHROID, LEVOTHROID) 125 MCG tablet Take 125 mcg by mouth daily before breakfast.   Yes Historical Provider, MD  Multiple Vitamins-Minerals (CENTRUM SILVER PO) Take 1 tablet by mouth daily.   Yes Historical Provider, MD  aspirin EC 81 MG tablet Take 81 mg by mouth every other day.     Historical Provider, MD  carvedilol (COREG) 3.125 MG tablet Take 1 tablet (3.125 mg total) by mouth 2 (two) times daily with a meal. 09/08/13   [name], MD   Physical Exam: Filed Vitals:   09/22/14 0704 09/22/14 0739  BP: 137/64   Pulse: 100   Temp: 98.1 F (36.7 C)   TempSrc: Oral   Resp:  16   Height: 5\' 11"  (1.803 m)   Weight: 105.235 kg (232 lb)   SpO2:  93%    General: examined in ED. Appears calm and comfortable, mildly ill Eyes: PERRL, normal lids, irises  ENT: hard of hearing; normal lips & tongue Neck: no LAD, masses or thyromegaly Cardiovascular: irregular, no m/r/g. 3+ LE edema R>L; RUE edema. Respiratory: CTA bilaterally anteriorly, no w/r/r. Posterior decreased breath sounds right base. Normal respiratory effort. Speaks in full sentences. Abdomen: soft, ntnd Skin: BLE grossly unremarkable Musculoskeletal: grossly normal tone BUE/BLE; moves all extremities well, lifts both legs off the bed Psychiatric: grossly normal mood and affect, speech fluent and appropriate Neurologic: grossly non-focal.  Wt Readings from Last 3 Encounters:  09/22/14 105.235 kg (232 lb)  10/02/13 112.22 kg (247 lb 6.4 oz)  09/15/13 124.1 kg (273 lb 9.5 oz)    Labs on Admission:  Basic Metabolic Panel:  Recent Labs Lab 09/22/14 0730  NA 141  K 5.6*  CL 112  CO2 21  GLUCOSE 175*  BUN 68*  CREATININE 2.90*  CALCIUM 8.5    Liver Function Tests:  Recent Labs Lab 09/22/14 0730  AST 175*  ALT 143*  ALKPHOS 63  BILITOT 0.6  PROT 6.7  ALBUMIN 3.4*    CBC:  Recent Labs Lab 09/17/14 0730 09/22/14 0730  WBC  --  6.8  NEUTROABS  --  5.2  HGB 7.0* 8.9*  HCT 23.8* 30.6*  MCV  --  93.6  PLT  --  198    Cardiac Enzymes:  Recent Labs Lab 09/22/14 0730  TROPONINI 0.28*    Radiological Exams on Admission: Dg Chest 2 View  09/22/2014   CLINICAL DATA:  Left anterior shoulder abrasion, Fall.  EXAM: CHEST  2 VIEW  COMPARISON:  09/02/2013  FINDINGS: There is a small to moderate right pleural effusion. Right lower lobe atelectasis or infiltrate. No confluent opacity on the left. Cardiomegaly with vascular congestion. No acute bony abnormality.  IMPRESSION: Small to moderate right pleural effusion with right lower lobe atelectasis or consolidation.  Cardiomegaly,  vascular congestion.   Electronically Signed   By: Rolm Baptise M.D.   On: 09/22/2014 08:20   Ct Head Wo Contrast  09/22/2014   CLINICAL DATA:  81 year old male with dizziness and fall this morning when going to the bathroom. Left side head and year injury. Labored breathing. Initial encounter.  EXAM: CT HEAD WITHOUT CONTRAST  TECHNIQUE: Contiguous axial images were obtained from the base of the skull through the vertex without intravenous contrast.  COMPARISON:  None.  FINDINGS: Chronic paranasal sinus surgery and changes of chronic sinusitis. Mastoids are clear.  No visible orbit or scalp soft tissue injury identified. No acute osseous abnormality identified.  Scattered areas of chronic appearing encephalomalacia in the left MCA territory. Chronic lacunar infarct in the left thalamus. Mild ex vacuo enlargement of the left lateral ventricle. No midline shift, mass  effect, or evidence of intracranial mass lesion. No acute intracranial hemorrhage identified. No evidence of cortically based acute infarction identified. No suspicious intracranial vascular hyperdensity. Possible small chronic lacunar infarcts in the cerebellar tonsils.  IMPRESSION: 1. No acute intracranial abnormality. No acute traumatic injury identified. 2. Chronic small and medium-sized vessel ischemia, mostly in the left hemisphere.   Electronically Signed   By: Lars Pinks M.D.   On: 09/22/2014 08:11   Dg Shoulder Left  09/22/2014   CLINICAL DATA:  Fall last night with abrasion and injury to left shoulder. Initial encounter.  EXAM: LEFT SHOULDER - 2+ VIEW  COMPARISON:  None.  FINDINGS: No acute fracture or dislocation identified. There are mild degenerative changes involving the [facility] joint and glenohumeral joint. No bony lesions or destruction.  IMPRESSION: No acute fracture identified.   Electronically Signed   By: Aletta Edouard M.D.   On: 09/22/2014 08:18    EKG: Independently reviewed. Atrial fibrillation, VR 107, LBBB, no acute changes;  LBBB is old compared to 09/07/2013 EKG   Principal Problem:   Acute systolic CHF (congestive heart failure) Active Problems:   Anemia of chronic disease   Fall   Acute respiratory failure with hypoxia   Atrial fibrillation   Elevated troponin   Acute renal failure   Hyperkalemia   Elevated LFTs   Assessment/Plan 1. Acute hypoxic respiratory failure, respiratory status stable, presumed secondary to heart failure. 2. Acute systolic/diastolic CHF with lower extremity edema/volume overload, BNP 1657. LVEF 40-45 with WMA and grade 1 diastolic dysfunction on echo 08/2013 3. New dx atrial fibrillation, rate appears overall control. Duration unknown. Asymptomatic. 4. Elevated troponin, suspect combination of renal failure and possible demand mediated ischemia, he has had no chest pain in his pulmonary problems have been present for at least a week. Doubt ACS. 5. Acute renal failure superimposed on chronic kidney disease stage III-IV with associated hyperkalemia, suspect relative hypoperfusion related to heart failure. 6. Hyperkalemia, modest, asymptomatic, EKG without acute changes. Secondary to renal failure. Expect spontaneous improvement with diuresis. 7. RLL atelectasis with small-moderate right pleural effusion, plan diuresis and follow clinically. 8. Fall at home with closed head injury and left shoulder pain, superficial laceration left ear. CT head negative 9. Transfusion-dependent anemia of unclear etiology, recently received 2 units packed red blood cells; Hgb 8.9, was 7 on 1/29. 10. Elevated AST, ALT of unclear etiology 11. DM well-controlled, diet-controlled.   Plan admit to stepdown unit for telemetry monitoring, aggressive IV diuresis, oxygen supplementation.  Cycle troponin, give aspirin. Hold off on anticoagulants for now. Cardiology consultation for further recommendations.  CBC and basic metabolic panel in the morning.  Consult GI to expedite evaluation given follow-up was  already planned for this morning  RUE doppler given asymmetric upper extremity edema.  Check TSH  Give ASA, Lasix (done)  Code Status: full code DVT prophylaxis: SCDs Family Communication: discussed in detail with wife at bedside Disposition Plan/Anticipated LOS: admit SDU 2-4 days  Time spent: 60 minutes  Murray Hodgkins, MD  [facility] Pager [phone] 09/22/2014, 9:11 AM     Addendum Troponin elevated 1.13 Discussed with patient and wife They do not want anticoagulation Patient without CP--clinically history does not suggest ACS. Plan continue ASA, Coreg, statin; no ACE-I seoncdary to renal failure and K+ Check BMP this PM

## 2014-09-22 NOTE — ED Notes (Signed)
Report given to floor. Pt ready for transport. 

## 2014-09-22 NOTE — Consult Note (Signed)
Reason for Consult:   CHF, new AF  Requesting Physician: Dr. Murray Hodgkins  Primary cardiologist: Dr. Carlyle Dolly (consultation January 2015)  Consulting cardiologist: Dr. Satira Sark  HPI:  This is a 81 y.o. male with a past medical history significant for LBBB, type 2 diabetes mellitus, obesity, stage III renal disease, and cardiomyopathy with LVEF 40-45%. He was seen in Jan 2015 for pre op clearance prior to abdominal hernia repair. He had no history of MI or chest pain and he was cleared for surgery which he tolerated well.             He has noticed increasing leg edema for the past several weeks. He had a Rt LE venous doppler in Dec 2015 that was negative for a DVT. Over recent weeks he has had increasing edema, now in both legs. He has been increasingly SOB. This am he got up to BR and fell (missed chair, no syncope), his wife couldn't get him up. In the ED he was noted to be in AF and in suspected CHF. He is comfortable now during my exam. Monitor now looks like A flutter with variable VR-80s. His Troponin is slightly elevated2. He denies chest pain.       PMHx:  Past Medical History  Diagnosis Date  . Type 2 diabetes mellitus   . Anemia of chronic disease   . CKD (chronic kidney disease) stage 3, GFR 30-59 ml/min   . Herpes zoster   . Obesity   . Exposure to hazardous substance   . Intermittent Leukopenia   . Essential hypertension   . Sleep apnea     Sleep Apnea score of 7  . Cardiomyopathy     LVEF 40-45% January 2015  . Left bundle branch block     Past Surgical History  Procedure Laterality Date  . Nasal polyp surgery    . Ventral hernia repair N/A 09/14/2013    Procedure: HERNIA REPAIR VENTRAL ADULT WITH MESH;  Surgeon: Jamesetta So, MD;  Location: [facility];  Service: General;  Laterality: N/A;  . Insertion of mesh N/A 09/14/2013    Procedure: INSERTION OF MESH;  Surgeon: Jamesetta So, MD;  Location: [facility];  Service: General;  Laterality: N/A;     SOCHx:  reports that he has quit smoking. His smoking use included Cigars. His smokeless tobacco use includes Chew. He reports that he does not drink alcohol or use illicit drugs.  FAMHx: Family History  Problem Relation Age of Onset  . Heart attack Brother     CABG  . Diabetes Brother   . Stroke Father   . Heart Problems Mother   . Colon cancer Neg Hx     ALLERGIES: Allergies  Allergen Reactions  . Celery Oil     vomiting    ROS: Pertinent items are noted in HPI. See H&P for complete details He says he was recently taken off oral diabetic agents secondary to low BS. His po intake has been poor, (he was actually on his way to see Dr Gala Romney this am).   HOME MEDICATIONS: Prior to Admission medications   Medication Sig Start Date End Date Taking? Authorizing Provider  atorvastatin (LIPITOR) 20 MG tablet Take 20 mg by mouth daily.   Yes Historical Provider, MD  levothyroxine (SYNTHROID, LEVOTHROID) 125 MCG tablet Take 125 mcg by mouth daily before breakfast.   Yes Historical Provider, MD  Multiple Vitamins-Minerals (CENTRUM SILVER PO) Take 1 tablet by mouth daily.  Yes Historical Provider, MD  aspirin EC 81 MG tablet Take 81 mg by mouth every other day.     Historical Provider, MD  carvedilol (COREG) 3.125 MG tablet Take 1 tablet (3.125 mg total) by mouth 2 (two) times daily with a meal. 09/08/13   [name], MD    HOSPITAL MEDICATIONS: I have reviewed the patient's current medications.  VITALS: Blood pressure 110/64, pulse 45, temperature 98.1 F (36.7 C), temperature source Oral, resp. rate 20, height 5\' 11"  (1.803 m), weight 232 lb (105.235 kg), SpO2 95 %.  PHYSICAL EXAM: General appearance: alert, cooperative, no distress, morbidly obese and disheveled, unshaven Neck: no carotid bruit and no JVD Lungs: decreased breath sounds Rt base Heart: irregularly irregular rhythm Abdomen: obese, non tender Extremities: 2-3+ bilateral LE pitting edema  Pulses:  diminnished Skin: Skin color, texture, turgor normal. No rashes or lesions or cool, pale, moist Neurologic: Grossly normal  LABS: Results for orders placed or performed during the hospital encounter of 09/22/14 (from the past 24 hour(s))  Comprehensive metabolic panel     Status: Abnormal   Collection Time: 09/22/14  7:30 AM  Result Value Ref Range   Sodium 141 135 - 145 mmol/L   Potassium 5.6 (H) 3.5 - 5.1 mmol/L   Chloride 112 96 - 112 mmol/L   CO2 21 19 - 32 mmol/L   Glucose, Bld 175 (H) 70 - 99 mg/dL   BUN 68 (H) 6 - 23 mg/dL   Creatinine, Ser 2.90 (H) 0.50 - 1.35 mg/dL   Calcium 8.5 8.4 - 10.5 mg/dL   Total Protein 6.7 6.0 - 8.3 g/dL   Albumin 3.4 (L) 3.5 - 5.2 g/dL   AST 175 (H) 0 - 37 U/L   ALT 143 (H) 0 - 53 U/L   Alkaline Phosphatase 63 39 - 117 U/L   Total Bilirubin 0.6 0.3 - 1.2 mg/dL   GFR calc non Af Amer 19 (L) >90 mL/min   GFR calc Af Amer 22 (L) >90 mL/min   Anion gap 8 5 - 15  CBC with Differential     Status: Abnormal   Collection Time: 09/22/14  7:30 AM  Result Value Ref Range   WBC 6.8 4.0 - 10.5 K/uL   RBC 3.27 (L) 4.22 - 5.81 MIL/uL   Hemoglobin 8.9 (L) 13.0 - 17.0 g/dL   HCT 30.6 (L) 39.0 - 52.0 %   MCV 93.6 78.0 - 100.0 fL   MCH 27.2 26.0 - 34.0 pg   MCHC 29.1 (L) 30.0 - 36.0 g/dL   RDW 17.5 (H) 11.5 - 15.5 %   Platelets 198 150 - 400 K/uL   Neutrophils Relative % 76 43 - 77 %   Neutro Abs 5.2 1.7 - 7.7 K/uL   Lymphocytes Relative 14 12 - 46 %   Lymphs Abs 1.0 0.7 - 4.0 K/uL   Monocytes Relative 10 3 - 12 %   Monocytes Absolute 0.7 0.1 - 1.0 K/uL   Eosinophils Relative 0 0 - 5 %   Eosinophils Absolute 0.0 0.0 - 0.7 K/uL   Basophils Relative 0 0 - 1 %   Basophils Absolute 0.0 0.0 - 0.1 K/uL  Protime-INR     Status: Abnormal   Collection Time: 09/22/14  7:30 AM  Result Value Ref Range   Prothrombin Time 16.0 (H) 11.6 - 15.2 seconds   INR 1.26 0.00 - 1.49  Troponin I     Status: Abnormal   Collection Time: 09/22/14  7:30 AM  Result Value Ref  Range   Troponin I 0.28 (H) <0.031 ng/mL  Brain natriuretic peptide     Status: Abnormal   Collection Time: 09/22/14  7:31 AM  Result Value Ref Range   B Natriuretic Peptide 1657.0 (H) 0.0 - 100.0 pg/mL    EKG:   IMAGING: Dg Chest 2 View  09/22/2014   CLINICAL DATA:  Left anterior shoulder abrasion, Fall.  EXAM: CHEST  2 VIEW  COMPARISON:  09/02/2013  FINDINGS: There is a small to moderate right pleural effusion. Right lower lobe atelectasis or infiltrate. No confluent opacity on the left. Cardiomegaly with vascular congestion. No acute bony abnormality.  IMPRESSION: Small to moderate right pleural effusion with right lower lobe atelectasis or consolidation.  Cardiomegaly, vascular congestion.   Electronically Signed   By: Rolm Baptise M.D.   On: 09/22/2014 08:20   Ct Head Wo Contrast  09/22/2014   CLINICAL DATA:  81 year old male with dizziness and fall this morning when going to the bathroom. Left side head and year injury. Labored breathing. Initial encounter.  EXAM: CT HEAD WITHOUT CONTRAST  TECHNIQUE: Contiguous axial images were obtained from the base of the skull through the vertex without intravenous contrast.  COMPARISON:  None.  FINDINGS: Chronic paranasal sinus surgery and changes of chronic sinusitis. Mastoids are clear.  No visible orbit or scalp soft tissue injury identified. No acute osseous abnormality identified.  Scattered areas of chronic appearing encephalomalacia in the left MCA territory. Chronic lacunar infarct in the left thalamus. Mild ex vacuo enlargement of the left lateral ventricle. No midline shift, mass effect, or evidence of intracranial mass lesion. No acute intracranial hemorrhage identified. No evidence of cortically based acute infarction identified. No suspicious intracranial vascular hyperdensity. Possible small chronic lacunar infarcts in the cerebellar tonsils.  IMPRESSION: 1. No acute intracranial abnormality. No acute traumatic injury identified. 2. Chronic  small and medium-sized vessel ischemia, mostly in the left hemisphere.   Electronically Signed   By: Lars Pinks M.D.   On: 09/22/2014 08:11   Dg Shoulder Left  09/22/2014   CLINICAL DATA:  Fall last night with abrasion and injury to left shoulder. Initial encounter.  EXAM: LEFT SHOULDER - 2+ VIEW  COMPARISON:  None.  FINDINGS: No acute fracture or dislocation identified. There are mild degenerative changes involving the [facility] joint and glenohumeral joint. No bony lesions or destruction.  IMPRESSION: No acute fracture identified.   Electronically Signed   By: Aletta Edouard M.D.   On: 09/22/2014 08:18    IMPRESSION: Principal Problem:   Acute combined systolic and diastolic congestive heart failure Active Problems:   Anemia of chronic disease   Chronic renal insufficiency, stage III (moderate)   Abnormal EKG- known IVCD (LBBB type)   Fall   Acute respiratory failure with hypoxia   Atrial fibrillation/ atrilal flutter- new   Elevated troponin   Acute on chronic renal insufficiency   Hyperkalemia   Elevated LFTs   Type 2 diabetes mellitus with renal manifestations   Cardiomyopathy-etiology undetermined- EF 40-45% by echo Jan 2015   RECOMMENDATION: MD to see. Lasix ordered and he feels better. He will need repeat echo. His rate currently is not too fast. Continue home dose Coreg, IV Diltiazem prn for rate control as B/P tolerates. Continue to cycle enzymes but suspect demand ischemia and acute on chronic renal insuffiencey responsible for this (as noted in admission note).  Time Spent Directly with Patient: 45 minutes  Kerin Ransom PA-C [phone] beeper 09/22/2014, 2:32 PM  Attending note:  Patient seen and examined, reviewed available records including prior cardiology consultation by Dr. Harl Bowie back in January 2015. Mr. Greg [name] has a history of left bundle branch block with cardiomyopathy, LVEF 40-45%, type 2 diabetes mellitus, hyperlipidemia, and stage III renal disease. He has been  experiencing progressive leg swelling over the last several weeks culminating in increasing shortness of breath and fatigue. He denies specifically any chest pain or palpitations. This morning when he got up to go to the bathroom, he missed sitting down in a chair and fell onto the floor, was not able to get up. He had no dizziness or syncope at that time. ER evaluation found him to be in newly documented atrial fibrillation/flutter. He also has evidence of acute on chronic renal failure with creatinine up to 2.9, minor increase in troponin I of 0.28 and a BNP of 1657. We are consulted to assist with his management. ECG is not available for review, monitor looks to be coarse atrial fibrillation. On my examination the patient is in no distress, mildly short of breath. He has increased neck girth, difficult to assess JVP. Lungs exhibit diminished breath sounds particularly on the right, cardiac exam with irregularly irregular rhythm and indistinct PMI. Abdomen is protuberant, and there is 2-3+ bilateral leg edema with erythema and some ulcerations. Chest x-ray shows small to moderate size right pleural effusion with right lower lobe atelectasis versus consolidation. He does not endorse any cough or fevers.  At this point suspect acute on chronic systolic heart failure with volume overload, duration of atrial arrhythmia is not certain, may be persisting for some time now, last ECG was in January 2015. Doubt ACS but would check full set of cardiac markers, more likely to represent demand ischemia particularly with abnormal renal function and known cardiomyopathy. Would initiate IV diuresis with Lasix, carefully follow renal function, repeat echocardiogram to reassess LVEF and diastolic function. May need additional rate control depending on heart rate variability. Candidacy for long-term anticoagulation is not yet certain (chronic anemia, fall risk) although CHADSVASC score is 6. Our service will follow with  you.  Satira Sark, M.D., F.A.C.C.

## 2014-09-22 NOTE — ED Notes (Addendum)
Dr. Vanita Panda given critical value of Trop 1.13.

## 2014-09-22 NOTE — ED Provider Notes (Signed)
CSN: [id number]     Arrival date & time 09/22/14  0658 History   First MD Initiated Contact with Patient 09/22/14 [phone]     Chief Complaint  Patient presents with  . Fall     (Consider location/radiation/quality/duration/timing/severity/associated sxs/prior Treatment) HPI Comments: Patient is an 81 year old male with history of chronic renal insufficiency, anemia of chronic disease requiring transfusion. He presents today by EMS after a fall that occurred at home. He attempted to get out of bed this morning to use the restroom when he became dizzy and fell to the floor. He struck his head and left shoulder when he fell causing a skin tear to the anterior left shoulder and small, superficial laceration to the left ear. He denies loss of consciousness. He denies headache.  Mr. terlizzi has been undergoing testing recently for this anemia. He recently received 2 units of blood and is due to see Dr. Gala Romney this morning. He has also been having increased leg swelling over the past 3 weeks. He denies any chest pain but does feel somewhat weak and short of breath.  Patient is a 81 y.o. male presenting with fall. The history is provided by the patient and the spouse.  Fall This is a new problem. The current episode started less than 1 hour ago. The problem occurs constantly. The problem has not changed since onset.Associated symptoms include shortness of breath. Pertinent negatives include no chest pain and no abdominal pain. Nothing aggravates the symptoms. Nothing relieves the symptoms. He has tried nothing for the symptoms. The treatment provided no relief.    Past Medical History  Diagnosis Date  . Diabetes mellitus   . Anemia of chronic disease   . Leukopenia     Intermittent  . Mild renal insufficiency   . Herpes zoster     in the past  . Obesity   . Exposure to hazardous substance   . Anemia of chronic disease 02/10/2011  . Intermittent Leukopenia 02/10/2011  . Renal insufficiency 02/26/2011   . Hypertension   . Sleep apnea     Sleep Apnea score of 7   Past Surgical History  Procedure Laterality Date  . Nasal polyp surgery    . Ventral hernia repair N/A 09/14/2013    Procedure: HERNIA REPAIR VENTRAL ADULT WITH MESH;  Surgeon: Jamesetta So, MD;  Location: [facility];  Service: General;  Laterality: N/A;  . Insertion of mesh N/A 09/14/2013    Procedure: INSERTION OF MESH;  Surgeon: Jamesetta So, MD;  Location: [facility];  Service: General;  Laterality: N/A;   Family History  Problem Relation Age of Onset  . Heart attack Brother     CABG  . Diabetes Brother   . Stroke Father   . Heart Problems Mother   . Colon cancer Neg Hx    History  Substance Use Topics  . Smoking status: Former Smoker -- 0.00 packs/day for 25 years    Types: Cigars  . Smokeless tobacco: Current User    Types: Chew  . Alcohol Use: No    Review of Systems  Respiratory: Positive for shortness of breath.   Cardiovascular: Negative for chest pain.  Gastrointestinal: Negative for abdominal pain.  All other systems reviewed and are negative.     Allergies  Celery oil  Home Medications   Prior to Admission medications   Medication Sig Start Date End Date Taking? Authorizing Provider  aspirin EC 81 MG tablet Take 81 mg by mouth every 3 (three) days.  Historical Provider, MD  atorvastatin (LIPITOR) 20 MG tablet Take 20 mg by mouth daily.    Historical Provider, MD  carvedilol (COREG) 3.125 MG tablet Take 1 tablet (3.125 mg total) by mouth 2 (two) times daily with a meal. 09/08/13   [name], MD  furosemide (LASIX) 40 MG tablet Take 40 mg by mouth daily.  09/24/13   Historical Provider, MD  pioglitazone-metformin (ACTOPLUS MET) 15-500 MG per tablet Take 1 tablet by mouth 2 (two) times daily with a meal.    Historical Provider, MD   BP 137/64 mmHg  Pulse 100  Temp(Src) 98.1 F (36.7 C) (Oral)  Resp 16  Ht 5' 11"  (1.803 m)  Wt 232 lb (105.235 kg)  BMI 32.37 kg/m2 Physical Exam   Constitutional: He is oriented to person, place, and time. No distress.  Patient is an 81 year old male who appears chronically ill. He is somewhat pale.  HENT:  Head: Normocephalic and atraumatic.  There is a small laceration to the pinna of the left ear which is superficial.  TMs are clear without hemotympanum.  Eyes: EOM are normal. Pupils are equal, round, and reactive to light.  Neck: Normal range of motion. Neck supple.  Cardiovascular: Normal rate.   No murmur heard. Heart is irregularly irregular.  Pulmonary/Chest: Effort normal. No respiratory distress. He has rales.  There are slight rales in the bases bilaterally.  Abdominal: Soft. Bowel sounds are normal. He exhibits no distension. There is no tenderness.  Musculoskeletal: Normal range of motion. He exhibits edema.  There is a skin tear to the left anterior shoulder. He has good range of motion of the shoulder. Ulnar and radial pulses are palpable. He is able to flex, extend, and oppose all fingers.  There is 3+ pitting edema of the bilateral lower extremities in a stocking distribution.  Lymphadenopathy:    He has no cervical adenopathy.  Neurological: He is alert and oriented to person, place, and time. No cranial nerve deficit. He exhibits normal muscle tone. Coordination normal.  Skin: Skin is warm and dry. He is not diaphoretic.  Nursing note and vitals reviewed.   ED Course  Procedures (including critical care time) Labs Review Labs Reviewed  COMPREHENSIVE METABOLIC PANEL  CBC WITH DIFFERENTIAL/PLATELET  BRAIN NATRIURETIC PEPTIDE  PROTIME-INR  TROPONIN I    Imaging Review No results found.   Date: 09/22/2014  Rate: 107  Rhythm: atrial fibrillation  QRS Axis: left  Intervals: normal  ST/T Wave abnormalities: nonspecific T wave changes  Conduction Disutrbances:none  Narrative Interpretation:   Old EKG Reviewed: changes noted    MDM   Final diagnoses:  Fall    Patient is an 81 year old male  who presents with complaints of weakness and a fall this morning. He landed on his shoulder and right side of his head. Imaging studies of these areas are negative for acute process. Workup reveals evidence for congestive heart failure, acute renal failure, and new onset atrial fibrillation. I have spoken with Dr. Sarajane Jews from the hospital service who agrees to admit the patient for further evaluation of these issues.    Veryl Speak, MD 09/22/14 [phone]

## 2014-09-23 ENCOUNTER — Inpatient Hospital Stay (HOSPITAL_COMMUNITY): Payer: Medicare Other

## 2014-09-23 DIAGNOSIS — D638 Anemia in other chronic diseases classified elsewhere: Secondary | ICD-10-CM

## 2014-09-23 DIAGNOSIS — D500 Iron deficiency anemia secondary to blood loss (chronic): Secondary | ICD-10-CM | POA: Diagnosis present

## 2014-09-23 DIAGNOSIS — I4891 Unspecified atrial fibrillation: Secondary | ICD-10-CM

## 2014-09-23 DIAGNOSIS — I509 Heart failure, unspecified: Secondary | ICD-10-CM

## 2014-09-23 DIAGNOSIS — I248 Other forms of acute ischemic heart disease: Secondary | ICD-10-CM

## 2014-09-23 DIAGNOSIS — E1129 Type 2 diabetes mellitus with other diabetic kidney complication: Secondary | ICD-10-CM

## 2014-09-23 DIAGNOSIS — E875 Hyperkalemia: Secondary | ICD-10-CM

## 2014-09-23 DIAGNOSIS — J9601 Acute respiratory failure with hypoxia: Secondary | ICD-10-CM

## 2014-09-23 DIAGNOSIS — R9431 Abnormal electrocardiogram [ECG] [EKG]: Secondary | ICD-10-CM

## 2014-09-23 LAB — PROTIME-INR
INR: 1.71 — AB (ref 0.00–1.49)
Prothrombin Time: 20.2 seconds — ABNORMAL HIGH (ref 11.6–15.2)

## 2014-09-23 LAB — HEPATIC FUNCTION PANEL
ALK PHOS: 62 U/L (ref 39–117)
ALT: 468 U/L — ABNORMAL HIGH (ref 0–53)
AST: 686 U/L — ABNORMAL HIGH (ref 0–37)
Albumin: 3.4 g/dL — ABNORMAL LOW (ref 3.5–5.2)
BILIRUBIN TOTAL: 0.7 mg/dL (ref 0.3–1.2)
Bilirubin, Direct: 0.2 mg/dL (ref 0.0–0.5)
Indirect Bilirubin: 0.5 mg/dL (ref 0.3–0.9)
TOTAL PROTEIN: 6.6 g/dL (ref 6.0–8.3)

## 2014-09-23 LAB — BASIC METABOLIC PANEL
ANION GAP: 8 (ref 5–15)
BUN: 73 mg/dL — AB (ref 6–23)
CO2: 23 mmol/L (ref 19–32)
CREATININE: 3.02 mg/dL — AB (ref 0.50–1.35)
Calcium: 8.8 mg/dL (ref 8.4–10.5)
Chloride: 113 mmol/L — ABNORMAL HIGH (ref 96–112)
GFR, EST AFRICAN AMERICAN: 21 mL/min — AB (ref >90)
GFR, EST NON AFRICAN AMERICAN: 18 mL/min — AB (ref >90)
Glucose, Bld: 130 mg/dL — ABNORMAL HIGH (ref 70–99)
POTASSIUM: 5.1 mmol/L (ref 3.5–5.1)
SODIUM: 144 mmol/L (ref 135–145)

## 2014-09-23 LAB — GLUCOSE, CAPILLARY: Glucose-Capillary: 183 mg/dL — ABNORMAL HIGH (ref 70–99)

## 2014-09-23 LAB — TROPONIN I: TROPONIN I: 1.32 ng/mL — AB (ref <0.031)

## 2014-09-23 MED ORDER — ISOSORBIDE MONONITRATE ER 30 MG PO TB24
15.0000 mg | ORAL_TABLET | Freq: Every day | ORAL | Status: DC
  Administered 2014-09-23 – 2014-09-24 (×2): 15 mg via ORAL
  Filled 2014-09-23 (×2): qty 1

## 2014-09-23 MED ORDER — ENOXAPARIN SODIUM 60 MG/0.6ML ~~LOC~~ SOLN: 55.0000 mg | SUBCUTANEOUS | Status: DC

## 2014-09-23 MED ORDER — FUROSEMIDE 10 MG/ML IJ SOLN
80.0000 mg | Freq: Two times a day (BID) | INTRAMUSCULAR | Status: DC
  Administered 2014-09-23 – 2014-09-29 (×12): 80 mg via INTRAVENOUS
  Filled 2014-09-23 (×12): qty 8

## 2014-09-23 MED ORDER — FUROSEMIDE 10 MG/ML IJ SOLN: 40.0000 mg | Freq: Three times a day (TID) | INTRAMUSCULAR | Status: DC

## 2014-09-23 MED ORDER — PANTOPRAZOLE SODIUM 40 MG PO TBEC
40.0000 mg | DELAYED_RELEASE_TABLET | Freq: Every day | ORAL | Status: DC
  Administered 2014-09-23 – 2014-10-04 (×12): 40 mg via ORAL
  Filled 2014-09-23 (×12): qty 1

## 2014-09-23 MED ORDER — HEPARIN BOLUS VIA INFUSION
4000.0000 [IU] | Freq: Once | INTRAVENOUS | Status: AC
  Administered 2014-09-23: 4000 [IU] via INTRAVENOUS
  Filled 2014-09-23: qty 4000

## 2014-09-23 MED ORDER — HEPARIN (PORCINE) IN NACL 100-0.45 UNIT/ML-% IJ SOLN
1500.0000 [IU]/h | INTRAMUSCULAR | Status: AC
  Administered 2014-09-23 – 2014-09-24 (×2): 1600 [IU]/h via INTRAVENOUS
  Administered 2014-09-25: 1500 [IU]/h via INTRAVENOUS
  Administered 2014-09-25: 1600 [IU]/h via INTRAVENOUS
  Administered 2014-09-26 – 2014-10-03 (×9): 1500 [IU]/h via INTRAVENOUS
  Filled 2014-09-23 (×14): qty 250

## 2014-09-23 NOTE — Consult Note (Addendum)
Referring Provider: [name], MD Primary Care Physician:  [name], MD Primary Gastroenterologist:  Garfield Cornea, MD  Reason for Consultation:  Transfusion dependent anemia  HPI: Greg [name] is a 81 y.o. male with h/o anemia of chronic disease, renal insufficiency, HTN, hypothyroidism who presented to ER yesterday after a fall at home. We were supposed to see in our office yesterday for recent transfusion dependent anemia requiring 4 units of prbcs over the last four weeks. His wife provided much of the history today but patient was able to answer all ROS. He denies abdominal pain, GERD, dysphagia, vomiting, melena, rectal bleeding. Bowel movement daily with stool softener. Very poor appetite. Reports significant unintentional weight loss, no longer requiring diabetic medication. Wife reports that he used to weigh 280 pounds 1 year ago and dropped down to 188 pounds at his lowest. Weight is 245 pounds now but he has significant peripheral edema..  Over the past couple weeks he's had increasing lower extremity edema bilaterally with increased shortness of breath. No associated chest pain. Continued to become weaker and yesterday fell when trying to go to the bathroom. States he lost his balance. No loss of consciousness. Wife was unable to get him up so EMS was activated. Patient's last blood transfusion was one week ago. Hemoglobin was 7.0 on 09/17/2014. Yesterday's hemoglobin was 8.9. Ferritin was 14 on January 7. In the ER his AST was 175, ALT 143, albumin 3.4, total bilirubin 0.6, BNP 1657. Chest x-ray showed small to moderate right pleural effusion with right lower lobe atelectasis or consolidation, vascular congestion.  Patient was noted to have A. fib/CHF in the ER, no prior diagnosis. Cardiology is seeing patient. He has elevated troponins which is felt to be demand ischemia. Echocardiogram pending. Acute on chronic renal insufficiency, nephrology consult pending.  We last saw  the patient in February 2015 for weight loss, follow-up of colitis. He had underwent surgery in January 2015 for repair of right ventral lateral (Spegelian) hernia with mesh insertion. At that time we hold off on any endoscopic evaluation to allow him have time to recover from surgery. Unfortunately failed follow-up in the office the following month.  Heme positive X 3 as outpatient.    Prior to Admission medications   Medication Sig Start Date End Date Taking? Authorizing Provider  atorvastatin (LIPITOR) 20 MG tablet Take 20 mg by mouth daily.   Yes Historical Provider, MD  levothyroxine (SYNTHROID, LEVOTHROID) 125 MCG tablet Take 125 mcg by mouth daily before breakfast.   Yes Historical Provider, MD  Multiple Vitamins-Minerals (CENTRUM SILVER PO) Take 1 tablet by mouth daily.   Yes Historical Provider, MD  aspirin EC 81 MG tablet Take 81 mg by mouth every other day.     Historical Provider, MD  carvedilol (COREG) 3.125 MG tablet Take 1 tablet (3.125 mg total) by mouth 2 (two) times daily with a meal. 09/08/13   [name], MD    Current Facility-Administered Medications  Medication Dose Route Frequency Provider Last Rate Last Dose  . 0.9 %  sodium chloride infusion  250 mL Intravenous PRN Samuella Cota, MD      . acetaminophen (TYLENOL) tablet 650 mg  650 mg Oral Q4H PRN Samuella Cota, MD      . antiseptic oral rinse (CPC / CETYLPYRIDINIUM CHLORIDE 0.05%) solution 7 mL  7 mL Mouth Rinse BID [name], MD   7 mL at 09/23/14 0738  . aspirin EC tablet 81 mg  81  mg Oral Daily Samuella Cota, MD   81 mg at 09/23/14 4854  . atorvastatin (LIPITOR) tablet 20 mg  20 mg Oral q1800 Samuella Cota, MD   20 mg at 09/22/14 2050  . carvedilol (COREG) tablet 3.125 mg  3.125 mg Oral BID WC Samuella Cota, MD   3.125 mg at 09/23/14 0737  . furosemide (LASIX) injection 40 mg  40 mg Intravenous BID Samuella Cota, MD   40 mg at 09/23/14 6270  . levothyroxine (SYNTHROID,  LEVOTHROID) tablet 125 mcg  125 mcg Oral QAC breakfast Samuella Cota, MD   125 mcg at 09/23/14 0737  . ondansetron (ZOFRAN) injection 4 mg  4 mg Intravenous Q6H PRN Samuella Cota, MD      . sodium chloride 0.9 % injection 3 mL  3 mL Intravenous PRN Samuella Cota, MD        Allergies as of 09/22/2014 - Review Complete 09/22/2014  Allergen Reaction Noted  . Celery oil  09/03/2013    Past Medical History  Diagnosis Date  . Type 2 diabetes mellitus   . Anemia of chronic disease   . CKD (chronic kidney disease) stage 3, GFR 30-59 ml/min   . Herpes zoster   . Obesity   . Exposure to hazardous substance   . Intermittent Leukopenia   . Essential hypertension   . Sleep apnea     Sleep Apnea score of 7  . Cardiomyopathy     LVEF 40-45% January 2015  . Left bundle branch block     Past Surgical History  Procedure Laterality Date  . Nasal polyp surgery    . Ventral hernia repair N/A 09/14/2013    Procedure: HERNIA REPAIR VENTRAL ADULT WITH MESH;  Surgeon: Jamesetta So, MD;  Location: [facility];  Service: General;  Laterality: N/A;  . Insertion of mesh N/A 09/14/2013    Procedure: INSERTION OF MESH;  Surgeon: Jamesetta So, MD;  Location: [facility];  Service: General;  Laterality: N/A;    Family History  Problem Relation Age of Onset  . Heart attack Brother     CABG  . Diabetes Brother   . Stroke Father   . Heart Problems Mother   . Colon cancer Neg Hx     History   Social History  . Marital Status: Married    Spouse Name: N/A    Number of Children: N/A  . Years of Education: N/A   Occupational History  . Truck Geophysicist/field seismologist     Retired   Social History Main Topics  . Smoking status: Former Smoker -- 0.00 packs/day for 25 years    Types: Cigars  . Smokeless tobacco: Current User    Types: Chew  . Alcohol Use: No  . Drug Use: No  . Sexual Activity: Not Currently   Other Topics Concern  . Not on file   Social History Narrative     ROS:  General: Positive  for anorexia and weight loss. Positive for fatigue and weakness. No fever.  Eyes: Negative for vision changes.  ENT: Negative for hoarseness, difficulty swallowing , nasal congestion. CV: Negative for chest pain, angina, palpitations. Positive dyspnea on exertion, peripheral edema.  Respiratory: Negative for dyspnea at rest, cough, sputum, wheezing. Positive DOE GI: See history of present illness. GU:  Negative for dysuria, hematuria, urinary incontinence, urinary frequency, nocturnal urination.  MS: Negative for joint pain, low back pain.  Derm: Negative for rash or itching.  Neuro: Negative for  weakness, abnormal sensation, seizure, frequent headaches, memory loss, confusion.  Psych: Negative for anxiety, depression, suicidal ideation, hallucinations.  Endo: Negative for unusual weight change.  Heme: Negative for bruising or bleeding. Allergy: Negative for rash or hives.       Physical Examination: Vital signs in last 24 hours: Temp:  [98 F (36.7 C)-98.5 F (36.9 C)] 98.5 F (36.9 C) (02/04 0400) Pulse Rate:  [40-102] 97 (02/04 0737) Resp:  [16-29] 19 (02/04 0700) BP: (110-169)/(57-97) 167/68 mmHg (02/04 0737) SpO2:  [85 %-100 %] 94 % (02/04 0700) Weight:  [243 lb 6.2 oz (110.4 kg)-245 lb 9.5 oz (111.4 kg)] 245 lb 9.5 oz (111.4 kg) (02/04 0500)    General: Elderly white gentleman in no acute distress. Wife is at bedside.   Head: Normocephalic, atraumatic.   Eyes: Conjunctiva pale, no icterus. Mouth: Oropharyngeal mucosa moist and pink , no lesions erythema or exudate. Neck: Supple without thyromegaly, masses, or lymphadenopathy.  Lungs: Clear to auscultation bilaterally.  Heart: Regular rate and rhythm, no murmurs rubs or gallops.  Abdomen: Bowel sounds are normal, nontender, nondistended, no hepatosplenomegaly or masses, no abdominal bruits or    hernia , no rebound or guarding.   Rectal: Not performed Extremities: 3+ pitting edema to the midcalf bilaterally. No clubbing. He  does have some weeping from the anterior legs.  Neuro: Alert and oriented x 4 , grossly normal neurologically.  Skin: Warm and dry, no rash or jaundice.   Psych: Alert and cooperative, normal mood and affect.        Intake/Output from previous day: 02/03 0701 - 02/04 0700 In: 9 [I.V.:9] Out: 1175 [Urine:1175] Intake/Output this shift:    Lab Results: CBC  Recent Labs  09/22/14 0730  WBC 6.8  HGB 8.9*  HCT 30.6*  MCV 93.6  PLT 198   hemoglobin 7 on 09/17/2014, hemoglobin 5.9 on 08/25/2014. He has received a total of 4 units of packed red blood cells.  BMET  Recent Labs  09/22/14 0730 09/22/14 2018 09/23/14 0403  NA 141 143 144  K 5.6* 5.3* 5.1  CL 112 114* 113*  CO2 21 22 23   GLUCOSE 175* 147* 130*  BUN 68* 69* 73*  CREATININE 2.90* 3.04* 3.02*  CALCIUM 8.5 8.7 8.8   LFT  Recent Labs  09/22/14 0730 09/23/14 0401  BILITOT 0.6 0.7  BILIDIR  --  0.2  IBILI  --  0.5  ALKPHOS 63 62  AST 175* 686*  ALT 143* 468*  PROT 6.7 6.6  ALBUMIN 3.4* 3.4*    Lipase No results for input(s): LIPASE in the last 72 hours.  PT/INR  Recent Labs  09/22/14 0730  LABPROT 16.0*  INR 1.26      Imaging Studies: Dg Chest 2 View  09/22/2014   CLINICAL DATA:  Left anterior shoulder abrasion, Fall.  EXAM: CHEST  2 VIEW  COMPARISON:  09/02/2013  FINDINGS: There is a small to moderate right pleural effusion. Right lower lobe atelectasis or infiltrate. No confluent opacity on the left. Cardiomegaly with vascular congestion. No acute bony abnormality.  IMPRESSION: Small to moderate right pleural effusion with right lower lobe atelectasis or consolidation.  Cardiomegaly, vascular congestion.   Electronically Signed   By: Rolm Baptise M.D.   On: 09/22/2014 08:20   Ct Head Wo Contrast  09/22/2014   CLINICAL DATA:  81 year old male with dizziness and fall this morning when going to the bathroom. Left side head and year injury. Labored breathing. Initial encounter.  EXAM: CT HEAD  WITHOUT CONTRAST  TECHNIQUE: Contiguous axial images were obtained from the base of the skull through the vertex without intravenous contrast.  COMPARISON:  None.  FINDINGS: Chronic paranasal sinus surgery and changes of chronic sinusitis. Mastoids are clear.  No visible orbit or scalp soft tissue injury identified. No acute osseous abnormality identified.  Scattered areas of chronic appearing encephalomalacia in the left MCA territory. Chronic lacunar infarct in the left thalamus. Mild ex vacuo enlargement of the left lateral ventricle. No midline shift, mass effect, or evidence of intracranial mass lesion. No acute intracranial hemorrhage identified. No evidence of cortically based acute infarction identified. No suspicious intracranial vascular hyperdensity. Possible small chronic lacunar infarcts in the cerebellar tonsils.  IMPRESSION: 1. No acute intracranial abnormality. No acute traumatic injury identified. 2. Chronic small and medium-sized vessel ischemia, mostly in the left hemisphere.   Electronically Signed   By: Lars Pinks M.D.   On: 09/22/2014 08:11   Dg Shoulder Left  09/22/2014   CLINICAL DATA:  Fall last night with abrasion and injury to left shoulder. Initial encounter.  EXAM: LEFT SHOULDER - 2+ VIEW  COMPARISON:  None.  FINDINGS: No acute fracture or dislocation identified. There are mild degenerative changes involving the [facility] joint and glenohumeral joint. No bony lesions or destruction.  IMPRESSION: No acute fracture identified.   Electronically Signed   By: Aletta Edouard M.D.   On: 09/22/2014 08:18  [4 week]   Impression: 81 year old gentleman admitted with mixed CHF, new onset A. fib, demand ischemia, hypertension who also has history of recent transfusion dependent anemia. Early January his hemoglobin was in the 5 range. Over the past 4 weeks he has received 4 units of packed red blood cells. No reported overt GI bleeding although he was strongly Hemoccult positive. No prior EGD or  colonoscopy. He takes an aspirin every other day at home but no other NSAIDs. Clinically he has complaints of anorexia and weight loss.  Since admission he was noted to have elevation of his AST/ALT. Increase today. Back in January 2015 they were normal. I do not see where he has had any documented hypotension since presentation. ?related to rhabdomyolysis.   Plan: 1. Recommend GI work up including colonoscopy with EGD prior to discharge. 2. PPI daily.  3. Repeat LFTs in AM, if no improvement, consider abdominal u/s.  4. Recheck CBC in AM.  We would like to thank you for the opportunity to participate in the care of Greg [name].  Laureen Ochs. Bernarda Caffey [facility] [phone] 2/4/20161:33 PM     LOS: 1 day   Attending note:  Patient seen and examined in ICU room 5. Discussed assessment and plan with patient and wife at length. We'll continue to follow with you.

## 2014-09-23 NOTE — Progress Notes (Signed)
Consulting cardiologist: Kate Sable MD Primary Cardiologist: Carlyle Dolly MD  Cardiology Specific Problem List: 1.Atrial fib 2. Systolic CHF 3. Chronic LBBB 4. Hypertension  Subjective:    Tired but breathing better. No complaints of chest pain or dyspnea.   Objective:   Temp:  [98 F (36.7 C)-98.5 F (36.9 C)] 98.5 F (36.9 C) (02/04 0400) Pulse Rate:  [40-102] 97 (02/04 0737) Resp:  [16-29] 19 (02/04 0700) BP: (110-169)/(57-97) 167/68 mmHg (02/04 0737) SpO2:  [85 %-100 %] 94 % (02/04 0700) Weight:  [243 lb 6.2 oz (110.4 kg)-245 lb 9.5 oz (111.4 kg)] 245 lb 9.5 oz (111.4 kg) (02/04 0500)    Filed Weights   09/22/14 0704 09/22/14 1843 09/23/14 0500  Weight: 232 lb (105.235 kg) 243 lb 6.2 oz (110.4 kg) 245 lb 9.5 oz (111.4 kg)    Intake/Output Summary (Last 24 hours) at 09/23/14 0812 Last data filed at 09/23/14 0500  Gross per 24 hour  Intake      9 ml  Output   1175 ml  Net  -1166 ml    Telemetry: Atrial fib rates in the 80'-90's.   Exam:  General: No acute distress.  HEENT: Conjunctiva and lids normal, oropharynx clear.  Lungs: Clear to auscultation, nonlabored. Wearing O2.   Cardiac: Slightly elevated JVP or bruits. IRRR, 1/6 systolic murmur.   Abdomen: Normoactive bowel sounds, nontender, nondistended.  Extremities: 2+ pretibial to the feet pitting edema, bilaterally, distal pulses diminished. Generalized weakness.  Neuropsychiatric: Alert and oriented x3, affect appropriate. Hard of hearing.   Lab Results:  Basic Metabolic Panel:  Recent Labs Lab 09/22/14 0730 09/22/14 2018 09/23/14 0403  NA 141 143 144  K 5.6* 5.3* 5.1  CL 112 114* 113*  CO2 21 22 23   GLUCOSE 175* 147* 130*  BUN 68* 69* 73*  CREATININE 2.90* 3.04* 3.02*  CALCIUM 8.5 8.7 8.8    Liver Function Tests:  Recent Labs Lab 09/22/14 0730  AST 175*  ALT 143*  ALKPHOS 63  BILITOT 0.6  PROT 6.7  ALBUMIN 3.4*    CBC:  Recent Labs Lab 09/17/14 0730  09/22/14 0730  WBC  --  6.8  HGB 7.0* 8.9*  HCT 23.8* 30.6*  MCV  --  93.6  PLT  --  198    Cardiac Enzymes:  Recent Labs Lab 09/22/14 1554 09/22/14 2149 09/23/14 0403  TROPONINI 1.13* 1.33* 1.32*   Coagulation:  Recent Labs Lab 09/22/14 0730  INR 1.26    Radiology: Dg Chest 2 View  09/22/2014   CLINICAL DATA:  Left anterior shoulder abrasion, Fall.  EXAM: CHEST  2 VIEW  COMPARISON:  09/02/2013  FINDINGS: There is a small to moderate right pleural effusion. Right lower lobe atelectasis or infiltrate. No confluent opacity on the left. Cardiomegaly with vascular congestion. No acute bony abnormality.  IMPRESSION: Small to moderate right pleural effusion with right lower lobe atelectasis or consolidation.  Cardiomegaly, vascular congestion.   Electronically Signed   By: Rolm Baptise M.D.   On: 09/22/2014 08:20   Ct Head Wo Contrast  09/22/2014   CLINICAL DATA:  81 year old male with dizziness and fall this morning when going to the bathroom. Left side head and year injury. Labored breathing. Initial encounter.  EXAM: CT HEAD WITHOUT CONTRAST  TECHNIQUE: Contiguous axial images were obtained from the base of the skull through the vertex without intravenous contrast.  COMPARISON:  None.  FINDINGS: Chronic paranasal sinus surgery and changes of chronic sinusitis. Mastoids are clear.  No  visible orbit or scalp soft tissue injury identified. No acute osseous abnormality identified.  Scattered areas of chronic appearing encephalomalacia in the left MCA territory. Chronic lacunar infarct in the left thalamus. Mild ex vacuo enlargement of the left lateral ventricle. No midline shift, mass effect, or evidence of intracranial mass lesion. No acute intracranial hemorrhage identified. No evidence of cortically based acute infarction identified. No suspicious intracranial vascular hyperdensity. Possible small chronic lacunar infarcts in the cerebellar tonsils.  IMPRESSION: 1. No acute intracranial  abnormality. No acute traumatic injury identified. 2. Chronic small and medium-sized vessel ischemia, mostly in the left hemisphere.   Electronically Signed   By: Lars Pinks M.D.   On: 09/22/2014 08:11   Dg Shoulder Left  09/22/2014   CLINICAL DATA:  Fall last night with abrasion and injury to left shoulder. Initial encounter.  EXAM: LEFT SHOULDER - 2+ VIEW  COMPARISON:  None.  FINDINGS: No acute fracture or dislocation identified. There are mild degenerative changes involving the [facility] joint and glenohumeral joint. No bony lesions or destruction.  IMPRESSION: No acute fracture identified.   Electronically Signed   By: Aletta Edouard M.D.   On: 09/22/2014 08:18   Medications:   Scheduled Medications: . antiseptic oral rinse  7 mL Mouth Rinse BID  . aspirin EC  81 mg Oral Daily  . atorvastatin  20 mg Oral q1800  . carvedilol  3.125 mg Oral BID WC  . furosemide  40 mg Intravenous BID  . levothyroxine  125 mcg Oral QAC breakfast  :    PRN Medications: sodium chloride, acetaminophen, ondansetron (ZOFRAN) IV, sodium chloride   Assessment and Plan:   1. Atrial fibrillation:  CHADS VASC Score of 6. Not yet on NOAC, Xarelto 15 mg daily may be an option for him in the setting of renal failure. Will discuss further with Dr.Koneswaran. No evidence of hemorraghic CVA, but hx of anemia associated with CKD, fall risk. Continue rate control with coreg 3.125 daily. Echo is pending.   2. Mixed CHF: Most recent echo demonstrates that EF is 40-45%. He has significant pre-tibial edema. He has diuresed 1.16 liters since admission. Albumin 3.4. On IV lasix 40 mg BID. BP remains elevated. May be due to continued fluid. Will continue IV diuretics and consider increasing to Q 8 hours. Creatinine is stable but has increased from admission level.   3. Demand ischemia: Troponins have remained elevated but static for the last two results. Multifactorial with demand ischemia and CKD. Awaiting echo results.   4.  Hypertension: BP continues elevated. Only on coreg and diuretics. Consider nitrates vs hydralazine for BP control in the setting of CKD and systolic dysfunction. Will add low dose nitrates for now.       Greg [name]. Lawrence NP [facility]  09/23/2014, 8:12 AM   The patient was seen and examined, and I agree with the assessment and plan as documented above, with modifications as noted below. Pt admitted with acute on chronic systolic heart failure and atrial fibrillation. Troponin elevation consistent with demand ischemia in setting of chronic kidney disease. Awaiting renal and GI consultation. Although he does not have a history of falls as per his wife, Greg [name], given his ongoing issues with anemia requiring transfusions I do not feel he is an optimal candidate for anticoagulation at the present time although CHADSVASC score is 6. This can be revisited in the future. Echocardiogram has been ordered on 2/3. Due to atrial fibrillation, will not be able to accurately assess diastolic function  although LVEF and filling pressures can be assessed. For today, I will increase diuretic frequency to tid dosing, acknowledging that baseline renal dysfunction is already compromised, which may be the reason he has not had a more modest diuretic response. Repeat BP 134/72 during my exam. Will continue to follow along with primary team.

## 2014-09-23 NOTE — Care Management Utilization Note (Signed)
UR completed 

## 2014-09-23 NOTE — Progress Notes (Signed)
  Echocardiogram 2D Echocardiogram has been performed.  Lysle Rubens 09/23/2014, 2:35 PM

## 2014-09-23 NOTE — Progress Notes (Signed)
Subjective: He was admitted yesterday with acute on chronic heart failure and new onset atrial fibrillation. He had been having trouble with bleeding and required blood transfusions. At baseline he has chronic renal failure and had  previously had anemia of chronic disease. He has diabetes which is generally been well controlled. He's had increasing edema that was thought mostly to be related to his anemia. He received blood transfusions 2. He was scheduled to see the gastroenterologist but got increasingly sick before he could get in. This morning he says he feels better. He has no new complaints. He says his breathing is better.  Objective: Vital signs in last 24 hours: Temp:  [98 F (36.7 C)-98.5 F (36.9 C)] 98.5 F (36.9 C) (02/04 0400) Pulse Rate:  [40-102] 43 (02/04 0700) Resp:  [16-29] 19 (02/04 0700) BP: (110-169)/(57-97) 169/80 mmHg (02/04 0700) SpO2:  [85 %-100 %] 94 % (02/04 0700) Weight:  [110.4 kg (243 lb 6.2 oz)-111.4 kg (245 lb 9.5 oz)] 111.4 kg (245 lb 9.5 oz) (02/04 0500) Weight change:     Intake/Output from previous day: 02/03 0701 - 02/04 0700 In: 9 [I.V.:9] Out: 1175 [Urine:1175]  PHYSICAL EXAM General appearance: alert, cooperative and mild distress Resp: clear to auscultation bilaterally Cardio: He has atrial flutter fibrillation and his heart rate about 90. GI: soft, non-tender; bowel sounds normal; no masses,  no organomegaly Extremities: He has some chronic venous stasis and still has edema of his legs  Lab Results:  Results for orders placed or performed during the hospital encounter of 09/22/14 (from the past 48 hour(s))  Comprehensive metabolic panel     Status: Abnormal   Collection Time: 09/22/14  7:30 AM  Result Value Ref Range   Sodium 141 135 - 145 mmol/L   Potassium 5.6 (H) 3.5 - 5.1 mmol/L   Chloride 112 96 - 112 mmol/L   CO2 21 19 - 32 mmol/L   Glucose, Bld 175 (H) 70 - 99 mg/dL   BUN 68 (H) 6 - 23 mg/dL   Creatinine, Ser 2.90 (H) 0.50 -  1.35 mg/dL   Calcium 8.5 8.4 - 10.5 mg/dL   Total Protein 6.7 6.0 - 8.3 g/dL   Albumin 3.4 (L) 3.5 - 5.2 g/dL   AST 175 (H) 0 - 37 U/L   ALT 143 (H) 0 - 53 U/L   Alkaline Phosphatase 63 39 - 117 U/L   Total Bilirubin 0.6 0.3 - 1.2 mg/dL   GFR calc non Af Amer 19 (L) >90 mL/min   GFR calc Af Amer 22 (L) >90 mL/min    Comment: (NOTE) The eGFR has been calculated using the CKD EPI equation. This calculation has not been validated in all clinical situations. eGFR's persistently <90 mL/min signify possible Chronic Kidney Disease.    Anion gap 8 5 - 15  CBC with Differential     Status: Abnormal   Collection Time: 09/22/14  7:30 AM  Result Value Ref Range   WBC 6.8 4.0 - 10.5 K/uL   RBC 3.27 (L) 4.22 - 5.81 MIL/uL   Hemoglobin 8.9 (L) 13.0 - 17.0 g/dL   HCT 30.6 (L) 39.0 - 52.0 %   MCV 93.6 78.0 - 100.0 fL   MCH 27.2 26.0 - 34.0 pg   MCHC 29.1 (L) 30.0 - 36.0 g/dL   RDW 17.5 (H) 11.5 - 15.5 %   Platelets 198 150 - 400 K/uL   Neutrophils Relative % 76 43 - 77 %   Neutro Abs 5.2 1.7 -  7.7 K/uL   Lymphocytes Relative 14 12 - 46 %   Lymphs Abs 1.0 0.7 - 4.0 K/uL   Monocytes Relative 10 3 - 12 %   Monocytes Absolute 0.7 0.1 - 1.0 K/uL   Eosinophils Relative 0 0 - 5 %   Eosinophils Absolute 0.0 0.0 - 0.7 K/uL   Basophils Relative 0 0 - 1 %   Basophils Absolute 0.0 0.0 - 0.1 K/uL  Protime-INR     Status: Abnormal   Collection Time: 09/22/14  7:30 AM  Result Value Ref Range   Prothrombin Time 16.0 (H) 11.6 - 15.2 seconds   INR 1.26 0.00 - 1.49  Troponin I     Status: Abnormal   Collection Time: 09/22/14  7:30 AM  Result Value Ref Range   Troponin I 0.28 (H) <0.031 ng/mL    Comment:        PERSISTENTLY INCREASED TROPONIN VALUES IN THE RANGE OF 0.04-0.49 ng/mL CAN BE SEEN IN:       -UNSTABLE ANGINA       -CONGESTIVE HEART FAILURE       -MYOCARDITIS       -CHEST TRAUMA       -ARRYHTHMIAS       -LATE PRESENTING MYOCARDIAL INFARCTION       -COPD   CLINICAL FOLLOW-UP  RECOMMENDED.   Brain natriuretic peptide     Status: Abnormal   Collection Time: 09/22/14  7:31 AM  Result Value Ref Range   B Natriuretic Peptide 1657.0 (H) 0.0 - 100.0 pg/mL  Troponin I     Status: Abnormal   Collection Time: 09/22/14  3:54 PM  Result Value Ref Range   Troponin I 1.13 (HH) <0.031 ng/mL    Comment:        POSSIBLE MYOCARDIAL ISCHEMIA. SERIAL TESTING RECOMMENDED. CRITICAL RESULT CALLED TO, READ BACK BY AND VERIFIED WITH: BORTZ,C ON 09/22/14 AT 1715 BY C.LOY   MRSA PCR Screening     Status: None   Collection Time: 09/22/14  6:38 PM  Result Value Ref Range   MRSA by PCR NEGATIVE NEGATIVE    Comment:        The GeneXpert MRSA Assay (FDA approved for NASAL specimens only), is one component of a comprehensive MRSA colonization surveillance program. It is not intended to diagnose MRSA infection nor to guide or monitor treatment for MRSA infections.   Basic metabolic panel     Status: Abnormal   Collection Time: 09/22/14  8:18 PM  Result Value Ref Range   Sodium 143 135 - 145 mmol/L   Potassium 5.3 (H) 3.5 - 5.1 mmol/L   Chloride 114 (H) 96 - 112 mmol/L   CO2 22 19 - 32 mmol/L   Glucose, Bld 147 (H) 70 - 99 mg/dL   BUN 69 (H) 6 - 23 mg/dL   Creatinine, Ser 3.04 (H) 0.50 - 1.35 mg/dL   Calcium 8.7 8.4 - 10.5 mg/dL   GFR calc non Af Amer 18 (L) >90 mL/min   GFR calc Af Amer 21 (L) >90 mL/min    Comment: (NOTE) The eGFR has been calculated using the CKD EPI equation. This calculation has not been validated in all clinical situations. eGFR's persistently <90 mL/min signify possible Chronic Kidney Disease.    Anion gap 7 5 - 15  Troponin I     Status: Abnormal   Collection Time: 09/22/14  9:49 PM  Result Value Ref Range   Troponin I 1.33 (HH) <0.031 ng/mL    Comment:  POSSIBLE MYOCARDIAL ISCHEMIA. SERIAL TESTING RECOMMENDED. RESULT REPEATED AND VERIFIED CRITICAL RESULT CALLED TO, READ BACK BY AND VERIFIED WITH: KEITH,A AT 2355 ON 09/22/2014 BY  ISLEY,B   Troponin I     Status: Abnormal   Collection Time: 09/23/14  4:03 AM  Result Value Ref Range   Troponin I 1.32 (HH) <0.031 ng/mL    Comment: CRITICAL VALUE NOTED.  VALUE IS CONSISTENT WITH PREVIOUSLY REPORTED AND CALLED VALUE.        POSSIBLE MYOCARDIAL ISCHEMIA. SERIAL TESTING RECOMMENDED.   Basic metabolic panel     Status: Abnormal   Collection Time: 09/23/14  4:03 AM  Result Value Ref Range   Sodium 144 135 - 145 mmol/L   Potassium 5.1 3.5 - 5.1 mmol/L   Chloride 113 (H) 96 - 112 mmol/L   CO2 23 19 - 32 mmol/L   Glucose, Bld 130 (H) 70 - 99 mg/dL   BUN 73 (H) 6 - 23 mg/dL   Creatinine, Ser 3.02 (H) 0.50 - 1.35 mg/dL   Calcium 8.8 8.4 - 10.5 mg/dL   GFR calc non Af Amer 18 (L) >90 mL/min   GFR calc Af Amer 21 (L) >90 mL/min    Comment: (NOTE) The eGFR has been calculated using the CKD EPI equation. This calculation has not been validated in all clinical situations. eGFR's persistently <90 mL/min signify possible Chronic Kidney Disease.    Anion gap 8 5 - 15    ABGS No results for input(s): PHART, PO2ART, TCO2, HCO3 in the last 72 hours.  Invalid input(s): PCO2 CULTURES Recent Results (from the past 240 hour(s))  MRSA PCR Screening     Status: None   Collection Time: 09/22/14  6:38 PM  Result Value Ref Range Status   MRSA by PCR NEGATIVE NEGATIVE Final    Comment:        The GeneXpert MRSA Assay (FDA approved for NASAL specimens only), is one component of a comprehensive MRSA colonization surveillance program. It is not intended to diagnose MRSA infection nor to guide or monitor treatment for MRSA infections.    Studies/Results: Dg Chest 2 View  09/22/2014   CLINICAL DATA:  Left anterior shoulder abrasion, Fall.  EXAM: CHEST  2 VIEW  COMPARISON:  09/02/2013  FINDINGS: There is a small to moderate right pleural effusion. Right lower lobe atelectasis or infiltrate. No confluent opacity on the left. Cardiomegaly with vascular congestion. No acute bony  abnormality.  IMPRESSION: Small to moderate right pleural effusion with right lower lobe atelectasis or consolidation.  Cardiomegaly, vascular congestion.   Electronically Signed   By: Rolm Baptise M.D.   On: 09/22/2014 08:20   Ct Head Wo Contrast  09/22/2014   CLINICAL DATA:  81 year old male with dizziness and fall this morning when going to the bathroom. Left side head and year injury. Labored breathing. Initial encounter.  EXAM: CT HEAD WITHOUT CONTRAST  TECHNIQUE: Contiguous axial images were obtained from the base of the skull through the vertex without intravenous contrast.  COMPARISON:  None.  FINDINGS: Chronic paranasal sinus surgery and changes of chronic sinusitis. Mastoids are clear.  No visible orbit or scalp soft tissue injury identified. No acute osseous abnormality identified.  Scattered areas of chronic appearing encephalomalacia in the left MCA territory. Chronic lacunar infarct in the left thalamus. Mild ex vacuo enlargement of the left lateral ventricle. No midline shift, mass effect, or evidence of intracranial mass lesion. No acute intracranial hemorrhage identified. No evidence of cortically based acute infarction identified. No  suspicious intracranial vascular hyperdensity. Possible small chronic lacunar infarcts in the cerebellar tonsils.  IMPRESSION: 1. No acute intracranial abnormality. No acute traumatic injury identified. 2. Chronic small and medium-sized vessel ischemia, mostly in the left hemisphere.   Electronically Signed   By: Lars Pinks M.D.   On: 09/22/2014 08:11   Dg Shoulder Left  09/22/2014   CLINICAL DATA:  Fall last night with abrasion and injury to left shoulder. Initial encounter.  EXAM: LEFT SHOULDER - 2+ VIEW  COMPARISON:  None.  FINDINGS: No acute fracture or dislocation identified. There are mild degenerative changes involving the [facility] joint and glenohumeral joint. No bony lesions or destruction.  IMPRESSION: No acute fracture identified.   Electronically Signed   By:  Aletta Edouard M.D.   On: 09/22/2014 08:18    Medications:  Prior to Admission:  Prescriptions prior to admission  Medication Sig Dispense Refill Last Dose  . atorvastatin (LIPITOR) 20 MG tablet Take 20 mg by mouth daily.   09/21/2014 at Unknown time  . levothyroxine (SYNTHROID, LEVOTHROID) 125 MCG tablet Take 125 mcg by mouth daily before breakfast.   09/21/2014 at Unknown time  . Multiple Vitamins-Minerals (CENTRUM SILVER PO) Take 1 tablet by mouth daily.   09/21/2014 at Unknown time  . aspirin EC 81 MG tablet Take 81 mg by mouth every other day.    09/20/2014  . carvedilol (COREG) 3.125 MG tablet Take 1 tablet (3.125 mg total) by mouth 2 (two) times daily with a meal. 60 tablet 12 Taking   Scheduled: . antiseptic oral rinse  7 mL Mouth Rinse BID  . aspirin EC  81 mg Oral Daily  . atorvastatin  20 mg Oral q1800  . carvedilol  3.125 mg Oral BID WC  . furosemide  40 mg Intravenous BID  . levothyroxine  125 mcg Oral QAC breakfast   Continuous:  EXN:TZGYFV chloride, acetaminophen, ondansetron (ZOFRAN) IV, sodium chloride  Assesment: He was admitted with acute combined systolic and diastolic CHF. He says he feels better. He was able to sleep fairly flat although he is actually at about 30. He has less edema.  He has elevated troponin thought to be related to demand ischemia.  He has new onset atrial flutter/fibrillation. When he was seen in my office by examination he was in sinus rhythm but could have been in atrial flutter with block.  He has acute on chronic renal insufficiency and nephrology consultation has been requested  He has had acute blood loss anemia and has required blood transfusions 4 units. GI consultation has been requested. He had 3 of 3 stools in the office positive for blood he had previously had anemia of chronic disease.  He has diabetes which is pretty well controlled.  He has hypertension and that is fairly well controlled but he may need more  carvedilol  Principal Problem:   Acute combined systolic and diastolic congestive heart failure Active Problems:   Anemia of chronic disease   Chronic renal insufficiency, stage III (moderate)   Abnormal EKG- known IVCD (LBBB type)   Fall   Acute respiratory failure with hypoxia   Atrial fibrillation/ atrilal flutter- new   Elevated troponin   Acute on chronic renal insufficiency   Hyperkalemia   Elevated LFTs   Type 2 diabetes mellitus with renal manifestations   Cardiomyopathy-etiology undetermined- EF 40-45% by echo Jan 2015    Plan:Continue current treatments. Cardiology consultation has been done and cardiology help is noted and appreciated. Nephrology and GI consultation pending.  He may need anticoagulation but with the GI bleeding this may not be possible   LOS: 1 day   Meghin Thivierge L 09/23/2014, 7:32 AM

## 2014-09-23 NOTE — Consult Note (Signed)
Reason for Consult: Acute kidney injury superimposed on chronic Referring Physician: Dr. Debbe Odea Greg [name] is an 81 y.o. male.  HPI: He is a patient who has history of diabetes, obesity, chronic renal failure stage III in history of CHF presently was brought because of difficulty in breathing and no history of fall. According to his family's patient started having leg swelling the last couple of weeks. Started on the left and then went to his right leg. Patient also says that she was having difficulty breathing but is feeling much better now. His appetite is poor but he doesn't have any nausea vomiting.  Past Medical History  Diagnosis Date  . Type 2 diabetes mellitus   . Anemia of chronic disease   . CKD (chronic kidney disease) stage 3, GFR 30-59 ml/min   . Herpes zoster   . Obesity   . Exposure to hazardous substance   . Intermittent Leukopenia   . Essential hypertension   . Sleep apnea     Sleep Apnea score of 7  . Cardiomyopathy     LVEF 40-45% January 2015  . Left bundle branch block     Past Surgical History  Procedure Laterality Date  . Nasal polyp surgery    . Ventral hernia repair N/A 09/14/2013    Procedure: HERNIA REPAIR VENTRAL ADULT WITH MESH;  Surgeon: Jamesetta So, MD;  Location: [facility];  Service: General;  Laterality: N/A;  . Insertion of mesh N/A 09/14/2013    Procedure: INSERTION OF MESH;  Surgeon: Jamesetta So, MD;  Location: [facility];  Service: General;  Laterality: N/A;    Family History  Problem Relation Age of Onset  . Heart attack Brother     CABG  . Diabetes Brother   . Stroke Father   . Heart Problems Mother   . Colon cancer Neg Hx     Social History:  reports that he has quit smoking. His smoking use included Cigars. His smokeless tobacco use includes Chew. He reports that he does not drink alcohol or use illicit drugs.  Allergies:  Allergies  Allergen Reactions  . Celery Oil     vomiting    Medications: I have reviewed the patient's  current medications.  Results for orders placed or performed during the hospital encounter of 09/22/14 (from the past 48 hour(s))  Comprehensive metabolic panel     Status: Abnormal   Collection Time: 09/22/14  7:30 AM  Result Value Ref Range   Sodium 141 135 - 145 mmol/L   Potassium 5.6 (H) 3.5 - 5.1 mmol/L   Chloride 112 96 - 112 mmol/L   CO2 21 19 - 32 mmol/L   Glucose, Bld 175 (H) 70 - 99 mg/dL   BUN 68 (H) 6 - 23 mg/dL   Creatinine, Ser 2.90 (H) 0.50 - 1.35 mg/dL   Calcium 8.5 8.4 - 10.5 mg/dL   Total Protein 6.7 6.0 - 8.3 g/dL   Albumin 3.4 (L) 3.5 - 5.2 g/dL   AST 175 (H) 0 - 37 U/L   ALT 143 (H) 0 - 53 U/L   Alkaline Phosphatase 63 39 - 117 U/L   Total Bilirubin 0.6 0.3 - 1.2 mg/dL   GFR calc non Af Amer 19 (L) >90 mL/min   GFR calc Af Amer 22 (L) >90 mL/min    Comment: (NOTE) The eGFR has been calculated using the CKD EPI equation. This calculation has not been validated in all clinical situations. eGFR's persistently <90 mL/min signify  possible Chronic Kidney Disease.    Anion gap 8 5 - 15  CBC with Differential     Status: Abnormal   Collection Time: 09/22/14  7:30 AM  Result Value Ref Range   WBC 6.8 4.0 - 10.5 K/uL   RBC 3.27 (L) 4.22 - 5.81 MIL/uL   Hemoglobin 8.9 (L) 13.0 - 17.0 g/dL   HCT 30.6 (L) 39.0 - 52.0 %   MCV 93.6 78.0 - 100.0 fL   MCH 27.2 26.0 - 34.0 pg   MCHC 29.1 (L) 30.0 - 36.0 g/dL   RDW 17.5 (H) 11.5 - 15.5 %   Platelets 198 150 - 400 K/uL   Neutrophils Relative % 76 43 - 77 %   Neutro Abs 5.2 1.7 - 7.7 K/uL   Lymphocytes Relative 14 12 - 46 %   Lymphs Abs 1.0 0.7 - 4.0 K/uL   Monocytes Relative 10 3 - 12 %   Monocytes Absolute 0.7 0.1 - 1.0 K/uL   Eosinophils Relative 0 0 - 5 %   Eosinophils Absolute 0.0 0.0 - 0.7 K/uL   Basophils Relative 0 0 - 1 %   Basophils Absolute 0.0 0.0 - 0.1 K/uL  Protime-INR     Status: Abnormal   Collection Time: 09/22/14  7:30 AM  Result Value Ref Range   Prothrombin Time 16.0 (H) 11.6 - 15.2 seconds    INR 1.26 0.00 - 1.49  Troponin I     Status: Abnormal   Collection Time: 09/22/14  7:30 AM  Result Value Ref Range   Troponin I 0.28 (H) <0.031 ng/mL    Comment:        PERSISTENTLY INCREASED TROPONIN VALUES IN THE RANGE OF 0.04-0.49 ng/mL CAN BE SEEN IN:       -UNSTABLE ANGINA       -CONGESTIVE HEART FAILURE       -MYOCARDITIS       -CHEST TRAUMA       -ARRYHTHMIAS       -LATE PRESENTING MYOCARDIAL INFARCTION       -COPD   CLINICAL FOLLOW-UP RECOMMENDED.   Brain natriuretic peptide     Status: Abnormal   Collection Time: 09/22/14  7:31 AM  Result Value Ref Range   B Natriuretic Peptide 1657.0 (H) 0.0 - 100.0 pg/mL  Troponin I     Status: Abnormal   Collection Time: 09/22/14  3:54 PM  Result Value Ref Range   Troponin I 1.13 (HH) <0.031 ng/mL    Comment:        POSSIBLE MYOCARDIAL ISCHEMIA. SERIAL TESTING RECOMMENDED. CRITICAL RESULT CALLED TO, READ BACK BY AND VERIFIED WITH: BORTZ,C ON 09/22/14 AT 1715 BY C.LOY   MRSA PCR Screening     Status: None   Collection Time: 09/22/14  6:38 PM  Result Value Ref Range   MRSA by PCR NEGATIVE NEGATIVE    Comment:        The GeneXpert MRSA Assay (FDA approved for NASAL specimens only), is one component of a comprehensive MRSA colonization surveillance program. It is not intended to diagnose MRSA infection nor to guide or monitor treatment for MRSA infections.   Basic metabolic panel     Status: Abnormal   Collection Time: 09/22/14  8:18 PM  Result Value Ref Range   Sodium 143 135 - 145 mmol/L   Potassium 5.3 (H) 3.5 - 5.1 mmol/L   Chloride 114 (H) 96 - 112 mmol/L   CO2 22 19 - 32 mmol/L   Glucose, Bld 147 (  H) 70 - 99 mg/dL   BUN 69 (H) 6 - 23 mg/dL   Creatinine, Ser 3.04 (H) 0.50 - 1.35 mg/dL   Calcium 8.7 8.4 - 10.5 mg/dL   GFR calc non Af Amer 18 (L) >90 mL/min   GFR calc Af Amer 21 (L) >90 mL/min    Comment: (NOTE) The eGFR has been calculated using the CKD EPI equation. This calculation has not been validated in  all clinical situations. eGFR's persistently <90 mL/min signify possible Chronic Kidney Disease.    Anion gap 7 5 - 15  Troponin I     Status: Abnormal   Collection Time: 09/22/14  9:49 PM  Result Value Ref Range   Troponin I 1.33 (HH) <0.031 ng/mL    Comment:        POSSIBLE MYOCARDIAL ISCHEMIA. SERIAL TESTING RECOMMENDED. RESULT REPEATED AND VERIFIED CRITICAL RESULT CALLED TO, READ BACK BY AND VERIFIED WITH: KEITH,A AT 2355 ON 09/22/2014 BY ISLEY,B   Hepatic function panel     Status: Abnormal   Collection Time: 09/23/14  4:01 AM  Result Value Ref Range   Total Protein 6.6 6.0 - 8.3 g/dL   Albumin 3.4 (L) 3.5 - 5.2 g/dL   AST 686 (H) 0 - 37 U/L   ALT 468 (H) 0 - 53 U/L   Alkaline Phosphatase 62 39 - 117 U/L   Total Bilirubin 0.7 0.3 - 1.2 mg/dL   Bilirubin, Direct 0.2 0.0 - 0.5 mg/dL    Comment: Please note change in reference range.   Indirect Bilirubin 0.5 0.3 - 0.9 mg/dL  Troponin I     Status: Abnormal   Collection Time: 09/23/14  4:03 AM  Result Value Ref Range   Troponin I 1.32 (HH) <0.031 ng/mL    Comment: CRITICAL VALUE NOTED.  VALUE IS CONSISTENT WITH PREVIOUSLY REPORTED AND CALLED VALUE.        POSSIBLE MYOCARDIAL ISCHEMIA. SERIAL TESTING RECOMMENDED.   Basic metabolic panel     Status: Abnormal   Collection Time: 09/23/14  4:03 AM  Result Value Ref Range   Sodium 144 135 - 145 mmol/L   Potassium 5.1 3.5 - 5.1 mmol/L   Chloride 113 (H) 96 - 112 mmol/L   CO2 23 19 - 32 mmol/L   Glucose, Bld 130 (H) 70 - 99 mg/dL   BUN 73 (H) 6 - 23 mg/dL   Creatinine, Ser 3.02 (H) 0.50 - 1.35 mg/dL   Calcium 8.8 8.4 - 10.5 mg/dL   GFR calc non Af Amer 18 (L) >90 mL/min   GFR calc Af Amer 21 (L) >90 mL/min    Comment: (NOTE) The eGFR has been calculated using the CKD EPI equation. This calculation has not been validated in all clinical situations. eGFR's persistently <90 mL/min signify possible Chronic Kidney Disease.    Anion gap 8 5 - 15  Glucose, capillary      Status: Abnormal   Collection Time: 09/23/14 11:51 AM  Result Value Ref Range   Glucose-Capillary 183 (H) 70 - 99 mg/dL    Dg Chest 2 View  09/22/2014   CLINICAL DATA:  Left anterior shoulder abrasion, Fall.  EXAM: CHEST  2 VIEW  COMPARISON:  09/02/2013  FINDINGS: There is a small to moderate right pleural effusion. Right lower lobe atelectasis or infiltrate. No confluent opacity on the left. Cardiomegaly with vascular congestion. No acute bony abnormality.  IMPRESSION: Small to moderate right pleural effusion with right lower lobe atelectasis or consolidation.  Cardiomegaly, vascular congestion.  Electronically Signed   By: Rolm Baptise M.D.   On: 09/22/2014 08:20   Ct Head Wo Contrast  09/22/2014   CLINICAL DATA:  81 year old male with dizziness and fall this morning when going to the bathroom. Left side head and year injury. Labored breathing. Initial encounter.  EXAM: CT HEAD WITHOUT CONTRAST  TECHNIQUE: Contiguous axial images were obtained from the base of the skull through the vertex without intravenous contrast.  COMPARISON:  None.  FINDINGS: Chronic paranasal sinus surgery and changes of chronic sinusitis. Mastoids are clear.  No visible orbit or scalp soft tissue injury identified. No acute osseous abnormality identified.  Scattered areas of chronic appearing encephalomalacia in the left MCA territory. Chronic lacunar infarct in the left thalamus. Mild ex vacuo enlargement of the left lateral ventricle. No midline shift, mass effect, or evidence of intracranial mass lesion. No acute intracranial hemorrhage identified. No evidence of cortically based acute infarction identified. No suspicious intracranial vascular hyperdensity. Possible small chronic lacunar infarcts in the cerebellar tonsils.  IMPRESSION: 1. No acute intracranial abnormality. No acute traumatic injury identified. 2. Chronic small and medium-sized vessel ischemia, mostly in the left hemisphere.   Electronically Signed   By: Lars Pinks M.D.   On: 09/22/2014 08:11   Dg Shoulder Left  09/22/2014   CLINICAL DATA:  Fall last night with abrasion and injury to left shoulder. Initial encounter.  EXAM: LEFT SHOULDER - 2+ VIEW  COMPARISON:  None.  FINDINGS: No acute fracture or dislocation identified. There are mild degenerative changes involving the [facility] joint and glenohumeral joint. No bony lesions or destruction.  IMPRESSION: No acute fracture identified.   Electronically Signed   By: Aletta Edouard M.D.   On: 09/22/2014 08:18    Review of Systems  Constitutional: Negative for fever.  Respiratory: Positive for shortness of breath.   Cardiovascular: Positive for orthopnea and leg swelling.  Gastrointestinal: Negative for nausea, vomiting and abdominal pain.  Neurological: Positive for weakness.   Blood pressure 106/66, pulse 54, temperature 98 F (36.7 C), temperature source Axillary, resp. rate 18, height _0  (1.803 m), weight 111.4 kg (245 lb 9.5 oz), SpO2 96 %. Physical Exam  Constitutional: No distress.  Patient is somnolent but arousable and answered questions.  Eyes: No scleral icterus.  Neck: JVD present.  Cardiovascular: Normal rate and regular rhythm.   No murmur heard. Respiratory: No respiratory distress. He has no wheezes. He has rales.  GI: He exhibits no distension. There is no tenderness.  Musculoskeletal: He exhibits edema.    Assessment/Plan: Problem #1 acute kidney injury superimposed on chronic. This is most likely from ATN versus prerenal syndrome. Presently his BUN and creatinine is higher than his baseline. Problem #2 chronic renal failure stage III. His creatinine was 1.79 on 09/08/2013. His EGFR during that time was 37 cc per minute hence stage III. Etiology was thought to be secondary to diabetes/recurrent UTI/focal and segmental nephrosclerosis from obesity. Problem #3 hyperkalemia:  His potassium has corrected Problem #4 atrial fibrillation: His heart rate is controlled Problem #5 history  of systolic and diastolic dysfunction. Presently patient with significant sign of fluid overload Problem #6 diabetes Problem #7 cardiomyopathy Problem #8 anemia: Patient with history of blood transfusion. Presently his hemoglobin is low. Plan: Change Lasix to 80 mg IV twice a day. We'll check his basic metabolic panel in the morning.  Aurie Harroun S 09/23/2014, 1:06 PM

## 2014-09-23 NOTE — Care Management Note (Addendum)
    Page 1 of 2   10/15/2014     2:10:14 PM CARE MANAGEMENT NOTE 10/15/2014  Patient:  Greg [name], Greg [name]   Account Number:  [id number]  Date Initiated:  09/23/2014  Documentation initiated by:  Jolene Provost  Subjective/Objective Assessment:   Pt is from home, lives with wife. Pt is independent at baseline. Pt has no HH services, DME's or med needs prior to admission. Pt plans to discharge home with self care. Will cont. to follow for CM needs. May need home O2 ass. and PT eval.     Action/Plan:   Anticipated DC Date:  10/15/2014   Anticipated DC Plan:  [place]  CM consult      Choice offered to / List presented to:             Status of service:  In process, will continue to follow Medicare Important Message given?  YES (If response is "NO", the following Medicare IM given date fields will be blank) Date Medicare IM given:  10/15/2014 Medicare IM given by:  Magdalen Spatz Date Additional Medicare IM given:  10/13/2014 Additional Medicare IM given by:  [place]  Discharge Disposition:    Per UR Regulation:  Reviewed for med. necessity/level of care/duration of stay  If discussed at [place] of Stay Meetings, dates discussed:   09/28/2014  09/30/2014  10/05/2014  10/07/2014  10/12/2014    Comments:  10-13-14 1:40pm Luz Lex, RNBSN [phone] Now on on [place], off amio and pressors.  Plan for dcing milrinone today.  Eating pancakes and sausage for breakfast.  Pt evaled and recommending SNF.   Off IV antibiotics.  Prior to admission was home with wife and independent.  Wife came in and walks with a four pronged cane.  They live in [place].  SW consult placed.  10-11-14 1:15pm [place] [phone] Post op on bipap.  Continues on milrinone, amio and pressors.  Have asked for [place] to check insurance and days to see if elibible.  PT recommending SNF - CM will continue to follow.  10/07/14 Ellan Lambert, RN, BSN  [phone] Pt for surgical resection of colon mass on 10/08/14.  Will follow post op.  May need LTAC, per MD.  10/06/2014 0800 Jolene Provost, RN, MSN, CM Pt to transfer to [facility] today for surgery. 10/01/2014 [place], RN, MSN, 09/30/2014 [place], RN, MSN, CM PT recommends SNF at discharge. CSW is aware and will arrange for placement. Will cont to follow for CM needs. 09/24/2014 [place], RN, MSN, CM 09/23/2014 [place], RN, MSN, CM

## 2014-09-23 NOTE — Progress Notes (Signed)
ANTICOAGULATION CONSULT NOTE - Initial Consult  Pharmacy Consult for Heparin Indication: DVT  Allergies  Allergen Reactions  . Celery Oil     vomiting    Patient Measurements: Height: 5\' 11"  (180.3 cm) Weight: 245 lb 9.5 oz (111.4 kg) IBW/kg (Calculated) : 75.3 Heparin Dosing Weight: 99.3 kg  Vital Signs: Temp: 97.2 F (36.2 C) (02/04 1630) Temp Source: Axillary (02/04 1630) BP: 105/60 mmHg (02/04 1800) Pulse Rate: 65 (02/04 1800)  Labs:  Recent Labs  09/22/14 0730 09/22/14 1554 09/22/14 2018 09/22/14 2149 09/23/14 0403  HGB 8.9*  --   --   --   --   HCT 30.6*  --   --   --   --   PLT 198  --   --   --   --   LABPROT 16.0*  --   --   --   --   INR 1.26  --   --   --   --   CREATININE 2.90*  --  3.04*  --  3.02*  TROPONINI 0.28* 1.13*  --  1.33* 1.32*    Estimated Creatinine Clearance: 24.8 mL/min (by C-G formula based on Cr of 3.02).   Medical History: Past Medical History  Diagnosis Date  . Type 2 diabetes mellitus   . Anemia of chronic disease   . CKD (chronic kidney disease) stage 3, GFR 30-59 ml/min   . Herpes zoster   . Obesity   . Exposure to hazardous substance   . Intermittent Leukopenia   . Essential hypertension   . Sleep apnea     Sleep Apnea score of 7  . Cardiomyopathy     LVEF 40-45% January 2015  . Left bundle branch block     Medications:  Scheduled:  . antiseptic oral rinse  7 mL Mouth Rinse BID  . aspirin EC  81 mg Oral Daily  . atorvastatin  20 mg Oral q1800  . carvedilol  3.125 mg Oral BID WC  . furosemide  80 mg Intravenous BID  . heparin  4,000 Units Intravenous Once  . isosorbide mononitrate  15 mg Oral Daily  . levothyroxine  125 mcg Oral QAC breakfast  . pantoprazole  40 mg Oral Daily    Assessment: Venous doppler ultrasound, positive for right upper extremity axillary and brachial acute occlusive DVT Pharmacy protocol for heparin Lovenox DVT prophylaxis dose not given Labs reviewed  Goal of Therapy:  Heparin  level 0.3-0.7 units/ml Monitor platelets by anticoagulation protocol: Yes   Plan:  Give 4000 units bolus x 1 Start heparin infusion at 1600 units/hr Check anti-Xa level in 8 hours and daily while on heparin Continue to monitor H&H and platelets  Abner Greenspan, Kinjal Neitzke [place] 09/23/2014,7:16 PM

## 2014-09-24 DIAGNOSIS — I82409 Acute embolism and thrombosis of unspecified deep veins of unspecified lower extremity: Secondary | ICD-10-CM

## 2014-09-24 DIAGNOSIS — D500 Iron deficiency anemia secondary to blood loss (chronic): Secondary | ICD-10-CM

## 2014-09-24 DIAGNOSIS — I519 Heart disease, unspecified: Secondary | ICD-10-CM

## 2014-09-24 DIAGNOSIS — E1122 Type 2 diabetes mellitus with diabetic chronic kidney disease: Secondary | ICD-10-CM

## 2014-09-24 LAB — CBC
HEMATOCRIT: 28.1 % — AB (ref 39.0–52.0)
Hemoglobin: 8.3 g/dL — ABNORMAL LOW (ref 13.0–17.0)
MCH: 27.7 pg (ref 26.0–34.0)
MCHC: 29.5 g/dL — ABNORMAL LOW (ref 30.0–36.0)
MCV: 93.7 fL (ref 78.0–100.0)
Platelets: 140 10*3/uL — ABNORMAL LOW (ref 150–400)
RBC: 3 MIL/uL — ABNORMAL LOW (ref 4.22–5.81)
RDW: 17.4 % — AB (ref 11.5–15.5)
WBC: 7.3 10*3/uL (ref 4.0–10.5)

## 2014-09-24 LAB — MAGNESIUM: Magnesium: 2.2 mg/dL (ref 1.5–2.5)

## 2014-09-24 LAB — HEPATIC FUNCTION PANEL
ALT: 360 U/L — AB (ref 0–53)
AST: 259 U/L — ABNORMAL HIGH (ref 0–37)
Albumin: 2.9 g/dL — ABNORMAL LOW (ref 3.5–5.2)
Alkaline Phosphatase: 64 U/L (ref 39–117)
BILIRUBIN TOTAL: 0.5 mg/dL (ref 0.3–1.2)
Bilirubin, Direct: 0.2 mg/dL (ref 0.0–0.5)
Indirect Bilirubin: 0.3 mg/dL (ref 0.3–0.9)
TOTAL PROTEIN: 5.6 g/dL — AB (ref 6.0–8.3)

## 2014-09-24 LAB — BASIC METABOLIC PANEL
Anion gap: 7 (ref 5–15)
BUN: 83 mg/dL — AB (ref 6–23)
CHLORIDE: 111 mmol/L (ref 96–112)
CO2: 24 mmol/L (ref 19–32)
Calcium: 8.5 mg/dL (ref 8.4–10.5)
Creatinine, Ser: 3.3 mg/dL — ABNORMAL HIGH (ref 0.50–1.35)
GFR calc Af Amer: 19 mL/min — ABNORMAL LOW (ref >90)
GFR calc non Af Amer: 16 mL/min — ABNORMAL LOW (ref >90)
Glucose, Bld: 152 mg/dL — ABNORMAL HIGH (ref 70–99)
Potassium: 5 mmol/L (ref 3.5–5.1)
Sodium: 142 mmol/L (ref 135–145)

## 2014-09-24 LAB — HEPARIN LEVEL (UNFRACTIONATED): Heparin Unfractionated: 0.56 IU/mL (ref 0.30–0.70)

## 2014-09-24 MED ORDER — DOBUTAMINE IN D5W 4-5 MG/ML-% IV SOLN
2.5000 ug/kg/min | INTRAVENOUS | Status: DC
  Administered 2014-09-24 – 2014-09-30 (×3): 2.5 ug/kg/min via INTRAVENOUS
  Filled 2014-09-24 (×3): qty 250

## 2014-09-24 MED ORDER — ENSURE COMPLETE PO LIQD
237.0000 mL | Freq: Two times a day (BID) | ORAL | Status: DC
  Administered 2014-09-24 – 2014-10-06 (×19): 237 mL via ORAL

## 2014-09-24 NOTE — Progress Notes (Signed)
Consulting cardiologist: Kate Sable MD Primary Cardiologist: Carlyle Dolly MD  Cardiology Specific Problem List: 1. Atrial fibrillation 2. Systolic CHF 3. Hypertension  Subjective:    Denies chest pain. Breathing about the same, thirsty.  Objective:   Temp:  [97.2 F (36.2 C)-98.3 F (36.8 C)] 98.2 F (36.8 C) (02/05 0744) Pulse Rate:  [34-137] 68 (02/05 0630) Resp:  [16-26] 25 (02/05 0630) BP: (100-160)/(50-82) 113/63 mmHg (02/05 0630) SpO2:  [91 %-100 %] 98 % (02/05 0630) Weight:  [246 lb 7.6 oz (111.8 kg)] 246 lb 7.6 oz (111.8 kg) (02/05 0500)    Filed Weights   09/22/14 1843 09/23/14 0500 09/24/14 0500  Weight: 243 lb 6.2 oz (110.4 kg) 245 lb 9.5 oz (111.4 kg) 246 lb 7.6 oz (111.8 kg)    Intake/Output Summary (Last 24 hours) at 09/24/14 0749 Last data filed at 09/24/14 0600  Gross per 24 hour  Intake  524.8 ml  Output   1250 ml  Net -725.2 ml    Telemetry: Atrial fib rates int he 80's.  Exam:  General: No acute distress.  HEENT: Conjunctiva and lids normal, oropharynx clear.  Lungs: Clear to auscultation, nonlabored. Upper airway gurgling. Cleared some with coughing.  Cardiac: No elevated JVP or bruits. IRRR, distant, no gallop or rub.   Abdomen: Normoactive bowel sounds, nontender, nondistended.  Extremities: 2+-3+pitting pre-tibial edema, distal pulses diminished.   Neuropsychiatric: Alert and oriented x3, affect appropriate.   Lab Results:  Basic Metabolic Panel:  Recent Labs Lab 09/22/14 2018 09/23/14 0403 09/24/14 0500  NA 143 144 142  K 5.3* 5.1 5.0  CL 114* 113* 111  CO2 22 23 24   GLUCOSE 147* 130* 152*  BUN 69* 73* 83*  CREATININE 3.04* 3.02* 3.30*  CALCIUM 8.7 8.8 8.5    Liver Function Tests:  Recent Labs Lab 09/22/14 0730 09/23/14 0401 09/24/14 0500  AST 175* 686* 259*  ALT 143* 468* 360*  ALKPHOS 63 62 64  BILITOT 0.6 0.7 0.5  PROT 6.7 6.6 5.6*  ALBUMIN 3.4* 3.4* 2.9*    CBC:  Recent Labs Lab  09/22/14 0730 09/24/14 0500  WBC 6.8 7.3  HGB 8.9* 8.3*  HCT 30.6* 28.1*  MCV 93.6 93.7  PLT 198 140*    Cardiac Enzymes:  Recent Labs Lab 09/22/14 1554 09/22/14 2149 09/23/14 0403  TROPONINI 1.13* 1.33* 1.32*    Coagulation:  Recent Labs Lab 09/22/14 0730 09/23/14 1957  INR 1.26 1.71*   Echocardiogram: 09/24/2014 Procedure narrative: Transthoracic echocardiography. Image quality was adequate. The study was technically difficult, as a result of poor patient compliance. - Left ventricle: The cavity size was normal. Systolic function was severely reduced. The estimated ejection fraction was in the range of 20% to 25%. The study was not technically sufficient to allow evaluation of LV diastolic dysfunction due to atrial fibrillation. Doppler parameters are consistent with high ventricular filling pressure. Mild posterior wall with severe asymmetric septal hypertrophy (2.16 cm). - Ventricular septum: Septal motion showed abnormal function and dyssynergy. These changes are consistent with a left bundle branch block. - Aortic valve: Mildly calcified annulus. Trileaflet; mildly thickened leaflets. There was trivial regurgitation. - Mitral valve: Mildly thickened annulus. Mildly thickened leaflets . There was mild regurgitation. - Left atrium: The atrium was severely dilated. Volume/bsa, ES, (1-plane Simpson&'s, A2C): 42.6 ml/m^2. - Right ventricle: The cavity size was mildly dilated. Systolic function was mildly reduced. - Right atrium: The atrium was mildly to moderately dilated. - Tricuspid valve: There was mild regurgitation. -  Pulmonary arteries: PA peak pressure: 50 mm Hg (S). Moderately elevated pulmonary pressures. - Systemic veins: IVC is dilated with normal respiratory variation. Estimated CVP 8 mmHg.  Radiology: Dg Chest 2 View  09/22/2014   CLINICAL DATA:  Left anterior shoulder abrasion, Fall.  EXAM: CHEST  2 VIEW   COMPARISON:  09/02/2013  FINDINGS: There is a small to moderate right pleural effusion. Right lower lobe atelectasis or infiltrate. No confluent opacity on the left. Cardiomegaly with vascular congestion. No acute bony abnormality.  IMPRESSION: Small to moderate right pleural effusion with right lower lobe atelectasis or consolidation.  Cardiomegaly, vascular congestion.   Electronically Signed   By: Rolm Baptise M.D.   On: 09/22/2014 08:20   Ct Head Wo Contrast  09/22/2014   CLINICAL DATA:  81 year old male with dizziness and fall this morning when going to the bathroom. Left side head and year injury. Labored breathing. Initial encounter.  EXAM: CT HEAD WITHOUT CONTRAST  TECHNIQUE: Contiguous axial images were obtained from the base of the skull through the vertex without intravenous contrast.  COMPARISON:  None.  FINDINGS: Chronic paranasal sinus surgery and changes of chronic sinusitis. Mastoids are clear.  No visible orbit or scalp soft tissue injury identified. No acute osseous abnormality identified.  Scattered areas of chronic appearing encephalomalacia in the left MCA territory. Chronic lacunar infarct in the left thalamus. Mild ex vacuo enlargement of the left lateral ventricle. No midline shift, mass effect, or evidence of intracranial mass lesion. No acute intracranial hemorrhage identified. No evidence of cortically based acute infarction identified. No suspicious intracranial vascular hyperdensity. Possible small chronic lacunar infarcts in the cerebellar tonsils.  IMPRESSION: 1. No acute intracranial abnormality. No acute traumatic injury identified. 2. Chronic small and medium-sized vessel ischemia, mostly in the left hemisphere.   Electronically Signed   By: Lars Pinks M.D.   On: 09/22/2014 08:11   US Venous Img Upper Uni Right  09/23/2014   CLINICAL DATA:  Right upper extremity edema, pain  EXAM: RIGHT UPPER EXTREMITY VENOUS DOPPLER ULTRASOUND  TECHNIQUE: Gray-scale sonography with graded  compression, as well as color Doppler and duplex ultrasound were performed to evaluate the upper extremity deep venous system from the level of the subclavian vein and including the jugular, axillary, basilic, radial, ulnar and upper cephalic vein. Spectral Doppler was utilized to evaluate flow at rest and with distal augmentation maneuvers.  COMPARISON:  None.  FINDINGS: Contralateral Subclavian Vein: Respiratory phasicity is normal and symmetric with the symptomatic side. No evidence of thrombus. Normal compressibility.  Internal Jugular Vein: No evidence of thrombus. Normal compressibility, respiratory phasicity and response to augmentation.  Subclavian Vein: No evidence of thrombus. Normal compressibility, respiratory phasicity and response to augmentation.  Axillary Vein: Intraluminal hypoechoic thrombus present in the right axillary vein appearing occlusive. Axillary vein is noncompressible.  Cephalic Vein: No evidence of thrombus. Normal compressibility, respiratory phasicity and response to augmentation.  Basilic Vein: No evidence of thrombus. Normal compressibility, respiratory phasicity and response to augmentation.  Brachial Veins: Axillary thrombus extends into the brachial vein which is also occlusive and noncompressible. Brachial vein is duplicated.  Radial Veins: No evidence of thrombus. Normal compressibility, respiratory phasicity and response to augmentation.  Ulnar Veins: No evidence of thrombus. Normal compressibility, respiratory phasicity and response to augmentation.  Venous Reflux:  None visualized.  Other Findings:  Subcutaneous for arm edema evident.  IMPRESSION: Positive exam for right upper extremity axillary and brachial acute occlusive DVT.  These results will be called to the  ordering clinician or representative by the Radiology Department at the imaging location.   Electronically Signed   By: Daryll Brod M.D.   On: 09/23/2014 16:02   Dg Shoulder Left  09/22/2014   CLINICAL DATA:   Fall last night with abrasion and injury to left shoulder. Initial encounter.  EXAM: LEFT SHOULDER - 2+ VIEW  COMPARISON:  None.  FINDINGS: No acute fracture or dislocation identified. There are mild degenerative changes involving the [facility] joint and glenohumeral joint. No bony lesions or destruction.  IMPRESSION: No acute fracture identified.   Electronically Signed   By: Aletta Edouard M.D.   On: 09/22/2014 08:18    Medications:   Scheduled Medications: . antiseptic oral rinse  7 mL Mouth Rinse BID  . aspirin EC  81 mg Oral Daily  . atorvastatin  20 mg Oral q1800  . carvedilol  3.125 mg Oral BID WC  . furosemide  80 mg Intravenous BID  . isosorbide mononitrate  15 mg Oral Daily  . levothyroxine  125 mcg Oral QAC breakfast  . pantoprazole  40 mg Oral Daily    Infusions: . heparin 1,600 Units/hr (09/24/14 0600)    PRN Medications: sodium chloride, acetaminophen, ondansetron (ZOFRAN) IV, sodium chloride   Assessment and Plan:   1.Systolic CHF: Echocardiogram demonstrated severe systolic dysfunction with EF of 20-25%. Creatinine rising with urine output of 1.8 total since admission. Anasarca is prominent, likely from cardiorenal syndrome, low output. May benefit from low dose of inotropic support with dobutamine. Will discuss with Dr. Bronson Ing. High likelihood of cardiac arrhythmias. Appreciate Dr. Rhona Leavens input. He is increasing diuretics and is ok with high creatinine at this time. Not a candidate for invasive cardiac testing, re: cath due to renal failure and multiple co-morbidities.   2. Right Upper ext DVT: Found on ultrasound yesterday. Now on heparin gtt. GI is following for anemia which required transfusions on last admission. Continue to monitor H/H.   3. Hypertension: Now on nitrates to assist with this along with renal perfusion. He is low normal now.   4. Atrial fib: Rate is controlled. Now on heparin gtt for DVT.       [name]. Lawrence NP [place]  09/24/2014, 7:49  AM    The patient was seen and examined, and I agree with the assessment and plan as documented above, with modifications as noted below. Pt admitted with acute on chronic systolic heart failure and atrial fibrillation. Troponin elevation consistent with demand ischemia in setting of chronic kidney disease. GI and neprology following. EGD and colonoscopy planned prior to discharge. Although he does not have a history of falls as per his wife Alden Benjamin) given his ongoing issues with anemia requiring transfusions (Hgb down to 8.3), I do not feel he is an optimal candidate for anticoagulation at the present time although CHADSVASC score is 6. This can be revisited in the future. He is now on heparin for DVT complicating the issue further.  Echocardiogram personally reviewed on 2/4. EF has declined to 20-25%, which would explain only modest diuretic response to IV Lasix (due to hypoperfusion of kidneys due to low cardiac output). BUN/SCr reflective of this as well. Rhythm is now sinus with frequent PVC's. Will check serum magnesium. K is normal. Will initiate low-dose dobutamine infusion to augment contractility. Discussed management strategy with nurse as well. Should he have frequent prolonged runs of non-sustained ventricular tachycardia, I would then d/c dobutamine.  He is on Coreg. Dobutamine may also precipitate recurrence of atrial fibrillation.  Will d/c ASA given drop in Hgb to 8.3 and concomitant heparin use. BP 97/54, thus I will d/c low-dose Imdur as well. Hgb down to 8.3 so will d/c ASA. He is on heparin for DVT.  Details discussed with wife.

## 2014-09-24 NOTE — Progress Notes (Addendum)
INITIAL NUTRITION ASSESSMENT Pt meets criteria for Severe MALNUTRITION in the context of Chronic as evidenced by eating <50% estimated energy needs and losing >5% of wt in last month DOCUMENTATION CODES Per approved criteria  -Severe malnutrition in the context of chronic illness   INTERVENTION: 1. Provide Ensure BID to supplement oral intake   NUTRITION DIAGNOSIS: Inadequate oral intake related to reduced appetite as evidenced by loss of 27 pounds in 1 month and meeting an estimated 50% of energy needs for past few months  Goal: Pt to meet >/= 90% of their estimated nutrition needs   Monitor:  Oral intake, weight/fluid status, labs  Reason for Assessment: Assessment of nutritional status  81 y.o. male  Admitting Dx: Acute combined systolic and diastolic congestive heart failure  ASSESSMENT: 81 year old man with history of transfusion dependent anemia, combined systolic/diastolic dysfunction, diabetes mellitus presented to the emergency department after a fall at home with difficulty getting up afterwards. Initial evaluation revealed acute hypoxic respiratory failure, elevated troponin, new diagnosis atrial fibrillation, acute on chronic renal failure, hyperkalemia  Spoke with pt and family member. Family member believes his weight is much lower than is recorded and fluid is masking much of it. She claims he is ~188 pounds and has lost 100 pounds in the last 12 months   Pt has been eating very  little of his meals for a while now. He reports that over the past year he has had a slow decline in his appetite. He has also been losing weight over the past year. He drinks 2 Boost supplements daily, but eats very little of his meals due to lack of appetite. He has no issues with nausea, vomiting, diarrhea, or constipation. Pt was open to starting ensure BID in the hospital. Encouraged him to eat what he could at meals   Nutrition Focused Physical Exam: Very difficult to do nfpe d/t fluid  overload  Subcutaneous Fat:  Orbital Region:none Upper Arm Region: Unable to determine Thoracic and Lumbar Region: Unable to determine  Muscle:  Temple Region: Mild Clavicle Bone Region: Unable to determine Clavicle and Acromion Bone Region: Unable to determine Scapular Bone Region:Unable to determine Dorsal Hand:Unable to determine Patellar Region:Unable to determine Anterior Thigh Region: Unable to determine Posterior Calf Region: Unable to determine  Edema: +3 everywhere/anasarca   Height: Ht Readings from Last 1 Encounters:  09/22/14 5\' 11"  (1.803 m)    Weight: Wt Readings from Last 1 Encounters:  09/24/14 246 lb 7.6 oz (111.8 kg)    Ideal Body Weight: 172 lbs  % Ideal Body Weight: 143%  Wt Readings from Last 10 Encounters:  09/24/14 246 lb 7.6 oz (111.8 kg)  10/02/13 247 lb 6.4 oz (112.22 kg)  09/15/13 273 lb 9.5 oz (124.1 kg)  09/07/13 273 lb 14.4 oz (124.24 kg)  08/28/10 273 lb 12.8 oz (124.195 kg)  08/28/10 273 lb 12.8 oz (124.195 kg)    Usual Body Weight: Reported to be 288  % Usual Body Weight: 85% but could be much less d/t fluid masking  BMI:  Body mass index is 34.39 kg/(m^2).  Estimated Nutritional Needs: Kcal: 1855 Protein: 156 Fluid: >1.85 liters  Skin: Weeping/ecchymosis  Diet Order: DIET DYS 3  EDUCATION NEEDS: -No education needs identified at this time   Intake/Output Summary (Last 24 hours) at 09/24/14 1332 Last data filed at 09/24/14 1200  Gross per 24 hour  Intake  284.8 ml  Output   2150 ml  Net -1865.2 ml  ate 50% of breakfast  Last BM: Unknown  Labs:   Recent Labs Lab 09/22/14 2018 09/23/14 0403 09/24/14 0454 09/24/14 0500  NA 143 144  --  142  K 5.3* 5.1  --  5.0  CL 114* 113*  --  111  CO2 22 23  --  24  BUN 69* 73*  --  83*  CREATININE 3.04* 3.02*  --  3.30*  CALCIUM 8.7 8.8  --  8.5  MG  --   --  2.2  --   GLUCOSE 147* 130*  --  152*    CBG (last 3)   Recent Labs  09/23/14 1151  GLUCAP 183*     Scheduled Meds: . antiseptic oral rinse  7 mL Mouth Rinse BID  . atorvastatin  20 mg Oral q1800  . carvedilol  3.125 mg Oral BID WC  . furosemide  80 mg Intravenous BID  . levothyroxine  125 mcg Oral QAC breakfast  . pantoprazole  40 mg Oral Daily    Continuous Infusions: . DOBUTamine 2.5 mcg/kg/min (09/24/14 0959)  . heparin 1,600 Units/hr (09/24/14 0813)    Past Medical History  Diagnosis Date  . Type 2 diabetes mellitus   . Anemia of chronic disease   . CKD (chronic kidney disease) stage 3, GFR 30-59 ml/min   . Herpes zoster   . Obesity   . Exposure to hazardous substance   . Intermittent Leukopenia   . Essential hypertension   . Sleep apnea     Sleep Apnea score of 7  . Cardiomyopathy     LVEF 40-45% January 2015  . Left bundle branch block     Past Surgical History  Procedure Laterality Date  . Nasal polyp surgery    . Ventral hernia repair N/A 09/14/2013    Procedure: HERNIA REPAIR VENTRAL ADULT WITH MESH;  Surgeon: Jamesetta So, MD;  Location: [facility];  Service: General;  Laterality: N/A;  . Insertion of mesh N/A 09/14/2013    Procedure: INSERTION OF MESH;  Surgeon: Jamesetta So, MD;  Location: [facility];  Service: General;  Laterality: N/A;    Burtis Junes RD, LDN Nutrition [phone] 09/24/2014 1:32 PM

## 2014-09-24 NOTE — Progress Notes (Signed)
Subjective: Interval History: has no complaint of nausea or vomiting. Patient states that he is feeling much better as far as his breathing is concerned. Presently patient does not have any chest pain and no difficulty breathing..  Objective: Vital signs in last 24 hours: Temp:  [97.2 F (36.2 C)-98.3 F (36.8 C)] 98.2 F (36.8 C) (02/05 0744) Pulse Rate:  [34-137] 68 (02/05 0630) Resp:  [16-26] 25 (02/05 0630) BP: (100-160)/(50-82) 113/63 mmHg (02/05 0630) SpO2:  [91 %-100 %] 98 % (02/05 0630) Weight:  [111.8 kg (246 lb 7.6 oz)] 111.8 kg (246 lb 7.6 oz) (02/05 0500) Weight change: 6.566 kg (14 lb 7.6 oz)  Intake/Output from previous day: 02/04 0701 - 02/05 0700 In: 524.8 [P.O.:360; I.V.:164.8] Out: 1250 [Urine:1250] Intake/Output this shift:    Generally: Patient looks more alert today and answering questions. He does not seem to be in any apparent distress. Chest decreased breath sound bilaterally but he doesn't have any wheezing or rales. Heart exam revealed iregular rate and rhythm no murmur Extremities: He has 2-3+ edema  Lab Results:  Recent Labs  09/22/14 0730 09/24/14 0500  WBC 6.8 7.3  HGB 8.9* 8.3*  HCT 30.6* 28.1*  PLT 198 140*   BMET:  Recent Labs  09/23/14 0403 09/24/14 0500  NA 144 142  K 5.1 5.0  CL 113* 111  CO2 23 24  GLUCOSE 130* 152*  BUN 73* 83*  CREATININE 3.02* 3.30*  CALCIUM 8.8 8.5   No results for input(s): PTH in the last 72 hours. Iron Studies: No results for input(s): IRON, TIBC, TRANSFERRIN, FERRITIN in the last 72 hours.  Studies/Results: Dg Chest 2 View  09/22/2014   CLINICAL DATA:  Left anterior shoulder abrasion, Fall.  EXAM: CHEST  2 VIEW  COMPARISON:  09/02/2013  FINDINGS: There is a small to moderate right pleural effusion. Right lower lobe atelectasis or infiltrate. No confluent opacity on the left. Cardiomegaly with vascular congestion. No acute bony abnormality.  IMPRESSION: Small to moderate right pleural effusion with  right lower lobe atelectasis or consolidation.  Cardiomegaly, vascular congestion.   Electronically Signed   By: Rolm Baptise M.D.   On: 09/22/2014 08:20   Ct Head Wo Contrast  09/22/2014   CLINICAL DATA:  81 year old male with dizziness and fall this morning when going to the bathroom. Left side head and year injury. Labored breathing. Initial encounter.  EXAM: CT HEAD WITHOUT CONTRAST  TECHNIQUE: Contiguous axial images were obtained from the base of the skull through the vertex without intravenous contrast.  COMPARISON:  None.  FINDINGS: Chronic paranasal sinus surgery and changes of chronic sinusitis. Mastoids are clear.  No visible orbit or scalp soft tissue injury identified. No acute osseous abnormality identified.  Scattered areas of chronic appearing encephalomalacia in the left MCA territory. Chronic lacunar infarct in the left thalamus. Mild ex vacuo enlargement of the left lateral ventricle. No midline shift, mass effect, or evidence of intracranial mass lesion. No acute intracranial hemorrhage identified. No evidence of cortically based acute infarction identified. No suspicious intracranial vascular hyperdensity. Possible small chronic lacunar infarcts in the cerebellar tonsils.  IMPRESSION: 1. No acute intracranial abnormality. No acute traumatic injury identified. 2. Chronic small and medium-sized vessel ischemia, mostly in the left hemisphere.   Electronically Signed   By: Lars Pinks M.D.   On: 09/22/2014 08:11   US Venous Img Upper Uni Right  09/23/2014   CLINICAL DATA:  Right upper extremity edema, pain  EXAM: RIGHT UPPER EXTREMITY VENOUS DOPPLER  ULTRASOUND  TECHNIQUE: Gray-scale sonography with graded compression, as well as color Doppler and duplex ultrasound were performed to evaluate the upper extremity deep venous system from the level of the subclavian vein and including the jugular, axillary, basilic, radial, ulnar and upper cephalic vein. Spectral Doppler was utilized to evaluate flow  at rest and with distal augmentation maneuvers.  COMPARISON:  None.  FINDINGS: Contralateral Subclavian Vein: Respiratory phasicity is normal and symmetric with the symptomatic side. No evidence of thrombus. Normal compressibility.  Internal Jugular Vein: No evidence of thrombus. Normal compressibility, respiratory phasicity and response to augmentation.  Subclavian Vein: No evidence of thrombus. Normal compressibility, respiratory phasicity and response to augmentation.  Axillary Vein: Intraluminal hypoechoic thrombus present in the right axillary vein appearing occlusive. Axillary vein is noncompressible.  Cephalic Vein: No evidence of thrombus. Normal compressibility, respiratory phasicity and response to augmentation.  Basilic Vein: No evidence of thrombus. Normal compressibility, respiratory phasicity and response to augmentation.  Brachial Veins: Axillary thrombus extends into the brachial vein which is also occlusive and noncompressible. Brachial vein is duplicated.  Radial Veins: No evidence of thrombus. Normal compressibility, respiratory phasicity and response to augmentation.  Ulnar Veins: No evidence of thrombus. Normal compressibility, respiratory phasicity and response to augmentation.  Venous Reflux:  None visualized.  Other Findings:  Subcutaneous for arm edema evident.  IMPRESSION: Positive exam for right upper extremity axillary and brachial acute occlusive DVT.  These results will be called to the ordering clinician or representative by the Radiology Department at the imaging location.   Electronically Signed   By: Daryll Brod M.D.   On: 09/23/2014 16:02   Dg Shoulder Left  09/22/2014   CLINICAL DATA:  Fall last night with abrasion and injury to left shoulder. Initial encounter.  EXAM: LEFT SHOULDER - 2+ VIEW  COMPARISON:  None.  FINDINGS: No acute fracture or dislocation identified. There are mild degenerative changes involving the [facility] joint and glenohumeral joint. No bony lesions or  destruction.  IMPRESSION: No acute fracture identified.   Electronically Signed   By: Aletta Edouard M.D.   On: 09/22/2014 08:18    I have reviewed the patient's current medications.  Assessment/Plan: Problem #1 CHF. Presently he is on Lasix and patient had about 1200 mL of urine output. Patient feels much better however is still he has significant anasarca. Problem #2 acute on chronic renal failure. Presently his BUN and creatinine seems to be increasing most likely from fluid removal. Problem #3 hyperkalemia his potassium has corrected Problem #4 anemia: His hemoglobin and hematocrit is low. Possibly compression of iron deficiency and anemia of chronic disease. Problem #5 type 2 diabetes Problem #6 elevated troponin: Patient presently denies any chest pain. Problem #7 atrial fibrillation: His heart rate is fluctuating but presently seems to have controlled.  Plan: We'll continue his Lasix. We'll check his basic metabolic panel, CBC and iron studies in the morning   LOS: 2 days   Paulena Servais S 09/24/2014,7:53 AM

## 2014-09-24 NOTE — Progress Notes (Signed)
Subjective: Patient remains lethargic and fatigued. Wife at bedside and able to help with subjective information. Patient states he's doing ok today. Denies any rectal bleeding, brbpr, or melena. Denies any abdominal pain. Per wife patient has been very sleepy, swelling seems improved to her. Is not accepting much po intake, poor appetite. Denies any chest pain, no dyspnea at rest currently.  Objective: Vital signs in last 24 hours: Temp:  [97.2 F (36.2 C)-98.4 F (36.9 C)] 98.4 F (36.9 C) (02/05 1145) Pulse Rate:  [35-137] 68 (02/05 1230) Resp:  [13-26] 14 (02/05 1230) BP: (97-127)/(50-80) 105/56 mmHg (02/05 1230) SpO2:  [92 %-100 %] 100 % (02/05 1230) Weight:  [246 lb 7.6 oz (111.8 kg)] 246 lb 7.6 oz (111.8 kg) (02/05 0500)   General:   Lethargic but oriented, pleasant. Sleeping but awakens to voice. Head:  Normocephalic and atraumatic. Eyes:  No icterus, sclera clear. Conjuctiva pink.  Heart:  Irregular rate and rhythm.  Lungs: Clear to auscultation bilaterally, without wheezing, rales, or rhonchi.  Abdomen:  Bowel sounds present, soft, non-tender, non-distended. No HSM or hernias noted. No rebound or guarding. No masses appreciated  Extremities:  2-3+ pitting edema bilateral LEs. Mild nonpitting edema just above the knees. Neurologic:  Alert and  oriented x4;  grossly normal neurologically. Skin:  Warm and dry, intact without significant lesions.   Intake/Output from previous day: 02/04 0701 - 02/05 0700 In: 524.8 [P.O.:360; I.V.:164.8] Out: 1250 [Urine:1250] Intake/Output this shift: Total I/O In: 120 [P.O.:120] Out: 900 [Urine:900]  Lab Results:  Recent Labs  09/22/14 0730 09/24/14 0500  WBC 6.8 7.3  HGB 8.9* 8.3*  HCT 30.6* 28.1*  PLT 198 140*   BMET  Recent Labs  09/22/14 2018 09/23/14 0403 09/24/14 0500  NA 143 144 142  K 5.3* 5.1 5.0  CL 114* 113* 111  CO2 _0 GLUCOSE 147* 130* 152*  BUN 69* 73* 83*  CREATININE 3.04* 3.02* 3.30*   CALCIUM 8.7 8.8 8.5   LFT  Recent Labs  09/22/14 0730 09/23/14 0401 09/24/14 0500  PROT 6.7 6.6 5.6*  ALBUMIN 3.4* 3.4* 2.9*  AST 175* 686* 259*  ALT 143* 468* 360*  ALKPHOS 63 62 64  BILITOT 0.6 0.7 0.5  BILIDIR  --  0.2 0.2  IBILI  --  0.5 0.3   PT/INR  Recent Labs  09/22/14 0730 09/23/14 1957  LABPROT 16.0* 20.2*  INR 1.26 1.71*   Hepatitis Panel No results for input(s): HEPBSAG, HCVAB, HEPAIGM, HEPBIGM in the last 72 hours.   Studies/Results: US Venous Img Upper Uni Right  09/23/2014   CLINICAL DATA:  Right upper extremity edema, pain  EXAM: RIGHT UPPER EXTREMITY VENOUS DOPPLER ULTRASOUND  TECHNIQUE: Gray-scale sonography with graded compression, as well as color Doppler and duplex ultrasound were performed to evaluate the upper extremity deep venous system from the level of the subclavian vein and including the jugular, axillary, basilic, radial, ulnar and upper cephalic vein. Spectral Doppler was utilized to evaluate flow at rest and with distal augmentation maneuvers.  COMPARISON:  None.  FINDINGS: Contralateral Subclavian Vein: Respiratory phasicity is normal and symmetric with the symptomatic side. No evidence of thrombus. Normal compressibility.  Internal Jugular Vein: No evidence of thrombus. Normal compressibility, respiratory phasicity and response to augmentation.  Subclavian Vein: No evidence of thrombus. Normal compressibility, respiratory phasicity and response to augmentation.  Axillary Vein: Intraluminal hypoechoic thrombus present in the right axillary vein appearing occlusive. Axillary vein is noncompressible.  Cephalic Vein: No  evidence of thrombus. Normal compressibility, respiratory phasicity and response to augmentation.  Basilic Vein: No evidence of thrombus. Normal compressibility, respiratory phasicity and response to augmentation.  Brachial Veins: Axillary thrombus extends into the brachial vein which is also occlusive and noncompressible. Brachial vein  is duplicated.  Radial Veins: No evidence of thrombus. Normal compressibility, respiratory phasicity and response to augmentation.  Ulnar Veins: No evidence of thrombus. Normal compressibility, respiratory phasicity and response to augmentation.  Venous Reflux:  None visualized.  Other Findings:  Subcutaneous for arm edema evident.  IMPRESSION: Positive exam for right upper extremity axillary and brachial acute occlusive DVT.  These results will be called to the ordering clinician or representative by the Radiology Department at the imaging location.   Electronically Signed   By: Daryll Brod M.D.   On: 09/23/2014 16:02    Assessment:  81 year old gentleman admitted with mixed CHF, new onset A. fib, demand ischemia, hypertension who also has history of recent transfusion dependent anemia. Early January his hemoglobin was in the 5 range. Over the past 4 weeks he has received 4 units of packed red blood cells. No reported overt GI bleeding although he was strongly Hemoccult positive. No prior EGD or colonoscopy. He takes an aspirin every other day at home but no other NSAIDs. Clinically he has complaints of anorexia and weight loss.  Echo notes decline in EF to 20-25%. Has had subsequent modest response to diuresis likely cardiorenal symdrome per cardiology/nephrology, started on low dose dobutamine. RUE doppler for edema demonstrates positive RUE axillary and brachial acute occlusive DVT and subsequently started on Heparin drip.  AST/ALT continued decline since admission. Today has improved to 259/360 (peak on admission 686/468). Consider ischemic hepatitis vs rhabdo. Other liver function parameters including total bili, direct bili, and alk phos normal. H/H today with minor decline to 8.3/28.1 from 8.9/30.6 2 days ago, overall remains stable with no signs or symptoms of overt GI bleed.   Plan:  1. Continue to monitor CBC closely given recent anemia of unknown etiology and heparin gtt for  DVT 2. Consider colonoscopy with EGD while inpatient as previously planned for anemia, sooner rather than later if any new GI bleed. 3. Monitor LFTs for any changes and if does not continue decline consider abdominal US. 4. Continue PPI 5. Supportive measures.   Walden Field, AGNP-C Adult & Gerontological Nurse Practitioner [facility]    LOS: 2 days    09/24/2014, 1:45 PM

## 2014-09-24 NOTE — Progress Notes (Signed)
Patient alert denies dyspnea anginal type chest pain orthopnea hemodynamically stable at present. Echo reveals systolic function of 24-23% ejection fraction. Hemoglobin remained stable at 8.3 CEDERICK BROADNAX NTI:[id number] DOB: [birth year] DOA: 09/22/2014 PCP: [name], MD             Physical Exam: Blood pressure 113/63, pulse 68, temperature 97.5 F (36.4 C), temperature source Axillary, resp. rate 25, height 5\' 11"  (1.803 m), weight 246 lb 7.6 oz (111.8 kg), SpO2 98 %. no JVD no carotid bruits no thyromegaly lungs diminished breath sounds in the bases no rales wheeze or rhonchi appreciable heart irregular regular no S3 auscultated no heaves thrills rubs   Investigations:  Recent Results (from the past 240 hour(s))  MRSA PCR Screening     Status: None   Collection Time: 09/22/14  6:38 PM  Result Value Ref Range Status   MRSA by PCR NEGATIVE NEGATIVE Final    Comment:        The GeneXpert MRSA Assay (FDA approved for NASAL specimens only), is one component of a comprehensive MRSA colonization surveillance program. It is not intended to diagnose MRSA infection nor to guide or monitor treatment for MRSA infections.      Basic Metabolic Panel:  Recent Labs  09/23/14 0403 09/24/14 0500  NA 144 142  K 5.1 5.0  CL 113* 111  CO2 23 24  GLUCOSE 130* 152*  BUN 73* 83*  CREATININE 3.02* 3.30*  CALCIUM 8.8 8.5   Liver Function Tests:  Recent Labs  09/23/14 0401 09/24/14 0500  AST 686* 259*  ALT 468* 360*  ALKPHOS 62 64  BILITOT 0.7 0.5  PROT 6.6 5.6*  ALBUMIN 3.4* 2.9*     CBC:  Recent Labs  09/22/14 0730 09/24/14 0500  WBC 6.8 7.3  NEUTROABS 5.2  --   HGB 8.9* 8.3*  HCT 30.6* 28.1*  MCV 93.6 93.7  PLT 198 140*    Dg Chest 2 View  09/22/2014   CLINICAL DATA:  Left anterior shoulder abrasion, Fall.  EXAM: CHEST  2 VIEW  COMPARISON:  09/02/2013  FINDINGS: There is a small to moderate right pleural effusion. Right lower lobe atelectasis or  infiltrate. No confluent opacity on the left. Cardiomegaly with vascular congestion. No acute bony abnormality.  IMPRESSION: Small to moderate right pleural effusion with right lower lobe atelectasis or consolidation.  Cardiomegaly, vascular congestion.   Electronically Signed   By: Rolm Baptise M.D.   On: 09/22/2014 08:20   Ct Head Wo Contrast  09/22/2014   CLINICAL DATA:  81 year old male with dizziness and fall this morning when going to the bathroom. Left side head and year injury. Labored breathing. Initial encounter.  EXAM: CT HEAD WITHOUT CONTRAST  TECHNIQUE: Contiguous axial images were obtained from the base of the skull through the vertex without intravenous contrast.  COMPARISON:  None.  FINDINGS: Chronic paranasal sinus surgery and changes of chronic sinusitis. Mastoids are clear.  No visible orbit or scalp soft tissue injury identified. No acute osseous abnormality identified.  Scattered areas of chronic appearing encephalomalacia in the left MCA territory. Chronic lacunar infarct in the left thalamus. Mild ex vacuo enlargement of the left lateral ventricle. No midline shift, mass effect, or evidence of intracranial mass lesion. No acute intracranial hemorrhage identified. No evidence of cortically based acute infarction identified. No suspicious intracranial vascular hyperdensity. Possible small chronic lacunar infarcts in the cerebellar tonsils.  IMPRESSION: 1. No acute intracranial abnormality. No acute traumatic injury identified. 2. Chronic small and  medium-sized vessel ischemia, mostly in the left hemisphere.   Electronically Signed   By: Lars Pinks M.D.   On: 09/22/2014 08:11   US Venous Img Upper Uni Right  09/23/2014   CLINICAL DATA:  Right upper extremity edema, pain  EXAM: RIGHT UPPER EXTREMITY VENOUS DOPPLER ULTRASOUND  TECHNIQUE: Gray-scale sonography with graded compression, as well as color Doppler and duplex ultrasound were performed to evaluate the upper extremity deep venous system  from the level of the subclavian vein and including the jugular, axillary, basilic, radial, ulnar and upper cephalic vein. Spectral Doppler was utilized to evaluate flow at rest and with distal augmentation maneuvers.  COMPARISON:  None.  FINDINGS: Contralateral Subclavian Vein: Respiratory phasicity is normal and symmetric with the symptomatic side. No evidence of thrombus. Normal compressibility.  Internal Jugular Vein: No evidence of thrombus. Normal compressibility, respiratory phasicity and response to augmentation.  Subclavian Vein: No evidence of thrombus. Normal compressibility, respiratory phasicity and response to augmentation.  Axillary Vein: Intraluminal hypoechoic thrombus present in the right axillary vein appearing occlusive. Axillary vein is noncompressible.  Cephalic Vein: No evidence of thrombus. Normal compressibility, respiratory phasicity and response to augmentation.  Basilic Vein: No evidence of thrombus. Normal compressibility, respiratory phasicity and response to augmentation.  Brachial Veins: Axillary thrombus extends into the brachial vein which is also occlusive and noncompressible. Brachial vein is duplicated.  Radial Veins: No evidence of thrombus. Normal compressibility, respiratory phasicity and response to augmentation.  Ulnar Veins: No evidence of thrombus. Normal compressibility, respiratory phasicity and response to augmentation.  Venous Reflux:  None visualized.  Other Findings:  Subcutaneous for arm edema evident.  IMPRESSION: Positive exam for right upper extremity axillary and brachial acute occlusive DVT.  These results will be called to the ordering clinician or representative by the Radiology Department at the imaging location.   Electronically Signed   By: Daryll Brod M.D.   On: 09/23/2014 16:02   Dg Shoulder Left  09/22/2014   CLINICAL DATA:  Fall last night with abrasion and injury to left shoulder. Initial encounter.  EXAM: LEFT SHOULDER - 2+ VIEW  COMPARISON:   None.  FINDINGS: No acute fracture or dislocation identified. There are mild degenerative changes involving the [facility] joint and glenohumeral joint. No bony lesions or destruction.  IMPRESSION: No acute fracture identified.   Electronically Signed   By: Aletta Edouard M.D.   On: 09/22/2014 08:18      Medications:   Impression:  Principal Problem:   Acute combined systolic and diastolic congestive heart failure Active Problems:   Anemia of chronic disease   Chronic renal insufficiency, stage III (moderate)   Abnormal EKG- known IVCD (LBBB type)   Fall   Acute respiratory failure with hypoxia   Atrial fibrillation/ atrilal flutter- new   Elevated troponin   Acute on chronic renal insufficiency   Hyperkalemia   Elevated LFTs   Type 2 diabetes mellitus with renal manifestations   Cardiomyopathy-etiology undetermined- EF 40-45% by echo Jan 2015   Anemia due to GI blood loss     Plan: Control ventricular response and systolic blood pressure monitor hemoglobin and hematocrit serially monitor renal function and electrolytes serially   Consultants: Cardiology gastroenterology nephrology    Procedures   Antibiotics:                   Code Status:  Family Communication:    Disposition Plan will consider nitrates versus hydralazine for elevated systolic pressures to new with carvedilol decision versus  anticoagulation deferred to GI and cardiology at present SCDs ordered  Time spent: 30 minutes   LOS: 2 days   Anselma Herbel M   09/24/2014, 7:11 AM

## 2014-09-24 NOTE — Progress Notes (Signed)
Pharmacist Heart Failure Core Measure Documentation  Assessment: Greg [name] has an EF documented as 20-25% on 09/23/14 by 2D ECHO.  Rationale: Heart failure patients with left ventricular systolic dysfunction (LVSD) and an EF < 40% should be prescribed an angiotensin converting enzyme inhibitor (ACEI) or angiotensin receptor blocker (ARB) at discharge unless a contraindication is documented in the medical record.  This patient is not currently on an ACEI or ARB for HF.  This note is being placed in the record in order to provide documentation that a contraindication to the use of these agents is present for this encounter.  ACE Inhibitor or Angiotensin Receptor Blocker is contraindicated (specify all that apply)  []   ACEI allergy AND ARB allergy []   Angioedema []   Moderate or severe aortic stenosis []   Hyperkalemia []   Hypotension []   Renal artery stenosis [x]   Worsening renal function, preexisting renal disease or dysfunction   Greg [name] 09/24/2014 3:03 PM

## 2014-09-24 NOTE — Progress Notes (Signed)
[place] for Heparin Indication: DVT  Allergies  Allergen Reactions  . Celery Oil     vomiting    Patient Measurements: Height: 5\' 11"  (180.3 cm) Weight: 246 lb 7.6 oz (111.8 kg) IBW/kg (Calculated) : 75.3 Heparin Dosing Weight: 100 kg  Vital Signs: Temp: 98.4 F (36.9 C) (02/05 1145) Temp Source: Oral (02/05 1145) BP: 105/56 mmHg (02/05 1230) Pulse Rate: 68 (02/05 1230)  Labs:  Recent Labs  09/22/14 0730 09/22/14 1554 09/22/14 2018 09/22/14 2149 09/23/14 0403 09/23/14 1957 09/24/14 0430 09/24/14 0500  HGB 8.9*  --   --   --   --   --   --  8.3*  HCT 30.6*  --   --   --   --   --   --  28.1*  PLT 198  --   --   --   --   --   --  140*  LABPROT 16.0*  --   --   --   --  20.2*  --   --   INR 1.26  --   --   --   --  1.71*  --   --   HEPARINUNFRC  --   --   --   --   --   --  0.56  --   CREATININE 2.90*  --  3.04*  --  3.02*  --   --  3.30*  TROPONINI 0.28* 1.13*  --  1.33* 1.32*  --   --   --     Estimated Creatinine Clearance: 22.7 mL/min (by C-G formula based on Cr of 3.3).   Medical History: Past Medical History  Diagnosis Date  . Type 2 diabetes mellitus   . Anemia of chronic disease   . CKD (chronic kidney disease) stage 3, GFR 30-59 ml/min   . Herpes zoster   . Obesity   . Exposure to hazardous substance   . Intermittent Leukopenia   . Essential hypertension   . Sleep apnea     Sleep Apnea score of 7  . Cardiomyopathy     LVEF 40-45% January 2015  . Left bundle branch block     Medications:  Scheduled:  . antiseptic oral rinse  7 mL Mouth Rinse BID  . atorvastatin  20 mg Oral q1800  . carvedilol  3.125 mg Oral BID WC  . furosemide  80 mg Intravenous BID  . levothyroxine  125 mcg Oral QAC breakfast  . pantoprazole  40 mg Oral Daily    Assessment: [name] admitted with new onset Afib & transfusion dependent anemia, FOBT x3 as outpatient.  Anticoagulation was initially held pending GI work-up.  Venous  doppler ultrasound now positive for right upper extremity axillary and brachial acute occlusive DVT and pharmacy asked to initiate to initiate heparin.  CBC reviewed.  No overt bleeding noted, however decline in Hg & Pltc since starting heparin.  Heparin level therapeutic.   Goal of Therapy:  Heparin level 0.3-0.7 units/ml Monitor platelets by anticoagulation protocol: Yes   Plan:  Continue heparin 1600 units/hr Daily heparin level & CBC while on heparin F/U GI plans for colonoscopy/EGD F/U plan for long-term anticoagulation once ok with GI  Chalet Kerwin, Lavonia Drafts 09/24/2014,1:12 PM

## 2014-09-25 DIAGNOSIS — D638 Anemia in other chronic diseases classified elsewhere: Secondary | ICD-10-CM

## 2014-09-25 DIAGNOSIS — E43 Unspecified severe protein-calorie malnutrition: Secondary | ICD-10-CM | POA: Diagnosis present

## 2014-09-25 DIAGNOSIS — R634 Abnormal weight loss: Secondary | ICD-10-CM

## 2014-09-25 DIAGNOSIS — I4891 Unspecified atrial fibrillation: Secondary | ICD-10-CM

## 2014-09-25 DIAGNOSIS — R7989 Other specified abnormal findings of blood chemistry: Secondary | ICD-10-CM

## 2014-09-25 DIAGNOSIS — N179 Acute kidney failure, unspecified: Secondary | ICD-10-CM

## 2014-09-25 LAB — HEPATIC FUNCTION PANEL
ALK PHOS: 62 U/L (ref 39–117)
ALT: 302 U/L — ABNORMAL HIGH (ref 0–53)
AST: 146 U/L — AB (ref 0–37)
Albumin: 3 g/dL — ABNORMAL LOW (ref 3.5–5.2)
BILIRUBIN DIRECT: 0.2 mg/dL (ref 0.0–0.5)
BILIRUBIN TOTAL: 0.7 mg/dL (ref 0.3–1.2)
Indirect Bilirubin: 0.5 mg/dL (ref 0.3–0.9)
Total Protein: 6 g/dL (ref 6.0–8.3)

## 2014-09-25 LAB — BASIC METABOLIC PANEL
Anion gap: 8 (ref 5–15)
BUN: 86 mg/dL — ABNORMAL HIGH (ref 6–23)
CO2: 25 mmol/L (ref 19–32)
CREATININE: 3.3 mg/dL — AB (ref 0.50–1.35)
Calcium: 8.6 mg/dL (ref 8.4–10.5)
Chloride: 108 mmol/L (ref 96–112)
GFR calc Af Amer: 19 mL/min — ABNORMAL LOW (ref >90)
GFR calc non Af Amer: 16 mL/min — ABNORMAL LOW (ref >90)
Glucose, Bld: 154 mg/dL — ABNORMAL HIGH (ref 70–99)
POTASSIUM: 4.2 mmol/L (ref 3.5–5.1)
Sodium: 141 mmol/L (ref 135–145)

## 2014-09-25 LAB — HEPARIN LEVEL (UNFRACTIONATED): Heparin Unfractionated: 0.67 IU/mL (ref 0.30–0.70)

## 2014-09-25 LAB — CBC
HCT: 27.7 % — ABNORMAL LOW (ref 39.0–52.0)
Hemoglobin: 8.2 g/dL — ABNORMAL LOW (ref 13.0–17.0)
MCH: 27.3 pg (ref 26.0–34.0)
MCHC: 29.6 g/dL — ABNORMAL LOW (ref 30.0–36.0)
MCV: 92.3 fL (ref 78.0–100.0)
Platelets: 149 10*3/uL — ABNORMAL LOW (ref 150–400)
RBC: 3 MIL/uL — ABNORMAL LOW (ref 4.22–5.81)
RDW: 17.1 % — ABNORMAL HIGH (ref 11.5–15.5)
WBC: 7.4 10*3/uL (ref 4.0–10.5)

## 2014-09-25 LAB — HEMOGLOBIN AND HEMATOCRIT, BLOOD
HEMATOCRIT: 28.7 % — AB (ref 39.0–52.0)
Hemoglobin: 8.5 g/dL — ABNORMAL LOW (ref 13.0–17.0)

## 2014-09-25 LAB — MAGNESIUM: Magnesium: 2.1 mg/dL (ref 1.5–2.5)

## 2014-09-25 NOTE — Progress Notes (Signed)
[place] for Heparin Indication: DVT  Allergies  Allergen Reactions  . Celery Oil     vomiting    Patient Measurements: Height: 5\' 11"  (180.3 cm) Weight: 246 lb 7.6 oz (111.8 kg) IBW/kg (Calculated) : 75.3 Heparin Dosing Weight: 100 kg  Vital Signs: Temp: 97.8 F (36.6 C) (02/06 0800) Temp Source: Axillary (02/06 0800) BP: 155/60 mmHg (02/06 0830) Pulse Rate: 67 (02/06 0830)  Labs:  Recent Labs  09/22/14 1554  09/22/14 2149 09/23/14 0403 09/23/14 1957 09/24/14 0430 09/24/14 0500 09/25/14 0452  HGB  --   --   --   --   --   --  8.3* 8.2*  HCT  --   --   --   --   --   --  28.1* 27.7*  PLT  --   --   --   --   --   --  140* 149*  LABPROT  --   --   --   --  20.2*  --   --   --   INR  --   --   --   --  1.71*  --   --   --   HEPARINUNFRC  --   --   --   --   --  0.56  --  0.67  CREATININE  --   < >  --  3.02*  --   --  3.30* 3.30*  TROPONINI 1.13*  --  1.33* 1.32*  --   --   --   --   < > = values in this interval not displayed.  Estimated Creatinine Clearance: 22.7 mL/min (by C-G formula based on Cr of 3.3).   Medical History: Past Medical History  Diagnosis Date  . Type 2 diabetes mellitus   . Anemia of chronic disease   . CKD (chronic kidney disease) stage 3, GFR 30-59 ml/min   . Herpes zoster   . Obesity   . Exposure to hazardous substance   . Intermittent Leukopenia   . Essential hypertension   . Sleep apnea     Sleep Apnea score of 7  . Cardiomyopathy     LVEF 40-45% January 2015  . Left bundle branch block     Medications:  Scheduled:  . antiseptic oral rinse  7 mL Mouth Rinse BID  . atorvastatin  20 mg Oral q1800  . carvedilol  3.125 mg Oral BID WC  . feeding supplement (ENSURE COMPLETE)  237 mL Oral BID BM  . furosemide  80 mg Intravenous BID  . levothyroxine  125 mcg Oral QAC breakfast  . pantoprazole  40 mg Oral Daily    Assessment: [name] admitted with new onset Afib & transfusion dependent  anemia, FOBT x3 as outpatient.  Anticoagulation was initially held pending GI work-up.  Venous doppler ultrasound now positive for right upper extremity axillary and brachial acute occlusive DVT and pharmacy asked to initiate heparin.  CBC has stabilized.  No overt bleeding noted.  Heparin level therapeutic at upper end of goal range.  Will decrease rate slightly to target lower end of range given concern for GI bleeding.   Goal of Therapy:  Heparin level 0.3-0.7 units/ml Monitor platelets by anticoagulation protocol: Yes   Plan:  Decrease heparin 1500 units/hr Daily heparin level & CBC while on heparin F/U GI plans for colonoscopy/EGD F/U plan for long-term anticoagulation once ok with GI  Greg [name] 09/25/2014,10:03 AM

## 2014-09-25 NOTE — Progress Notes (Signed)
Subjective: Interval History: Patient denies any difficulty breathing. His 80s feeling much better. Presently he offers no new complaints.  Objective: Vital signs in last 24 hours: Temp:  [97.4 F (36.3 C)-98.4 F (36.9 C)] 97.6 F (36.4 C) (02/06 0530) Pulse Rate:  [65-106] 69 (02/06 0700) Resp:  [13-25] 17 (02/06 0700) BP: (97-165)/(49-83) 145/53 mmHg (02/06 0700) SpO2:  [82 %-100 %] 92 % (02/06 0700) Weight:  [111.8 kg (246 lb 7.6 oz)] 111.8 kg (246 lb 7.6 oz) (02/06 0530) Weight change: 0 kg (0 lb)  Intake/Output from previous day: 02/05 0701 - 02/06 0700 In: 1387.7 [P.O.:960; I.V.:427.7] Out: 3675 [Urine:3675] Intake/Output this shift:    Generally: Patient is alert and answering questions. Chest decreased breath sound bilaterally but he doesn't have any wheezing or rales. Heart exam revealed iregular rate and rhythm no murmur Extremities: He has 2+ edema  Lab Results:  Recent Labs  09/24/14 0500 09/25/14 0452  WBC 7.3 7.4  HGB 8.3* 8.2*  HCT 28.1* 27.7*  PLT 140* 149*   BMET:   Recent Labs  09/24/14 0500 09/25/14 0452  NA 142 141  K 5.0 4.2  CL 111 108  CO2 24 25  GLUCOSE 152* 154*  BUN 83* 86*  CREATININE 3.30* 3.30*  CALCIUM 8.5 8.6   No results for input(s): PTH in the last 72 hours. Iron Studies: No results for input(s): IRON, TIBC, TRANSFERRIN, FERRITIN in the last 72 hours.  Studies/Results: US Venous Img Upper Uni Right  09/23/2014   CLINICAL DATA:  Right upper extremity edema, pain  EXAM: RIGHT UPPER EXTREMITY VENOUS DOPPLER ULTRASOUND  TECHNIQUE: Gray-scale sonography with graded compression, as well as color Doppler and duplex ultrasound were performed to evaluate the upper extremity deep venous system from the level of the subclavian vein and including the jugular, axillary, basilic, radial, ulnar and upper cephalic vein. Spectral Doppler was utilized to evaluate flow at rest and with distal augmentation maneuvers.  COMPARISON:  None.   FINDINGS: Contralateral Subclavian Vein: Respiratory phasicity is normal and symmetric with the symptomatic side. No evidence of thrombus. Normal compressibility.  Internal Jugular Vein: No evidence of thrombus. Normal compressibility, respiratory phasicity and response to augmentation.  Subclavian Vein: No evidence of thrombus. Normal compressibility, respiratory phasicity and response to augmentation.  Axillary Vein: Intraluminal hypoechoic thrombus present in the right axillary vein appearing occlusive. Axillary vein is noncompressible.  Cephalic Vein: No evidence of thrombus. Normal compressibility, respiratory phasicity and response to augmentation.  Basilic Vein: No evidence of thrombus. Normal compressibility, respiratory phasicity and response to augmentation.  Brachial Veins: Axillary thrombus extends into the brachial vein which is also occlusive and noncompressible. Brachial vein is duplicated.  Radial Veins: No evidence of thrombus. Normal compressibility, respiratory phasicity and response to augmentation.  Ulnar Veins: No evidence of thrombus. Normal compressibility, respiratory phasicity and response to augmentation.  Venous Reflux:  None visualized.  Other Findings:  Subcutaneous for arm edema evident.  IMPRESSION: Positive exam for right upper extremity axillary and brachial acute occlusive DVT.  These results will be called to the ordering clinician or representative by the Radiology Department at the imaging location.   Electronically Signed   By: Daryll Brod M.D.   On: 09/23/2014 16:02    I have reviewed the patient's current medications.  Assessment/Plan: Problem #1 CHF. Presently he is on Lasix and low-dose of dobutamine. His urine output has improved and he had about 3600 mL the last 24 hours. Even though patient is still has anasarca at  this moment seems to be improving. Problem #2 acute on chronic renal failure. Presently his BUN and creatinine high but stable. Problem #3  hyperkalemia his potassium has corrected Problem #4 anemia: His hemoglobin and hematocrit is low. Possibly compression of iron deficiency and anemia of chronic disease. His hemoglobin is stable. Problem #5 type 2 diabetes Problem #6 elevated troponin: Patient presently denies any chest pain. Problem #7 atrial fibrillation: His heart rate is fluctuating but presently seems to have controlled. Problem #8 elevated LFTs: Presently improving.  Problem #9 history of DVT: Patient on heparin. Plan: We'll continue his Lasix and dobutamine We'll check his basic metabolic panel, CBC  in the morning   LOS: 3 days   Rebecah Dangerfield S 09/25/2014,7:43 AM

## 2014-09-25 NOTE — Progress Notes (Signed)
Early on dobutamine infusion less dyspneic today creatinine 3.3 Greg [name] [id number] DOB: 1932-08-14 DOA: 09/22/2014 PCP: Greg Bogus, MD             Physical Exam: Blood pressure 135/62, pulse 66, temperature 97.8 F (36.6 C), temperature source Axillary, resp. rate 18, height 5\' 11"  (1.803 [name]), weight 246 lb 7.6 oz (111.8 kg), SpO2 95 %. lungs diminished breath sounds in both bases prolonged history phase no rales audible no wheezes audible heart regular no S3 auscultated no heaves thrills rubs   Investigations:  Recent Results (from the past 240 hour(s))  MRSA PCR Screening     Status: None   Collection Time: 09/22/14  6:38 PM  Result Value Ref Range Status   MRSA by PCR NEGATIVE NEGATIVE Final    Comment:        The GeneXpert MRSA Assay (FDA approved for NASAL specimens only), is one component of a comprehensive MRSA colonization surveillance program. It is not intended to diagnose MRSA infection nor to guide or monitor treatment for MRSA infections.      Basic Metabolic Panel:  Recent Labs  09/24/14 0454 09/24/14 0500 09/25/14 0452  NA  --  142 141  K  --  5.0 4.2  CL  --  111 108  CO2  --  24 25  GLUCOSE  --  152* 154*  BUN  --  83* 86*  CREATININE  --  3.30* 3.30*  CALCIUM  --  8.5 8.6  MG 2.2  --   --    Liver Function Tests:  Recent Labs  09/24/14 0500 09/25/14 0452  AST 259* 146*  ALT 360* 302*  ALKPHOS 64 62  BILITOT 0.5 0.7  PROT 5.6* 6.0  ALBUMIN 2.9* 3.0*     CBC:  Recent Labs  09/24/14 0500 09/25/14 0452  WBC 7.3 7.4  HGB 8.3* 8.2*  HCT 28.1* 27.7*  MCV 93.7 92.3  PLT 140* 149*    US Venous Img Upper Uni Right  09/23/2014   CLINICAL DATA:  Right upper extremity edema, pain  EXAM: RIGHT UPPER EXTREMITY VENOUS DOPPLER ULTRASOUND  TECHNIQUE: Gray-scale sonography with graded compression, as well as color Doppler and duplex ultrasound were performed to evaluate the upper extremity deep venous system from the  level of the subclavian vein and including the jugular, axillary, basilic, radial, ulnar and upper cephalic vein. Spectral Doppler was utilized to evaluate flow at rest and with distal augmentation maneuvers.  COMPARISON:  None.  FINDINGS: Contralateral Subclavian Vein: Respiratory phasicity is normal and symmetric with the symptomatic side. No evidence of thrombus. Normal compressibility.  Internal Jugular Vein: No evidence of thrombus. Normal compressibility, respiratory phasicity and response to augmentation.  Subclavian Vein: No evidence of thrombus. Normal compressibility, respiratory phasicity and response to augmentation.  Axillary Vein: Intraluminal hypoechoic thrombus present in the right axillary vein appearing occlusive. Axillary vein is noncompressible.  Cephalic Vein: No evidence of thrombus. Normal compressibility, respiratory phasicity and response to augmentation.  Basilic Vein: No evidence of thrombus. Normal compressibility, respiratory phasicity and response to augmentation.  Brachial Veins: Axillary thrombus extends into the brachial vein which is also occlusive and noncompressible. Brachial vein is duplicated.  Radial Veins: No evidence of thrombus. Normal compressibility, respiratory phasicity and response to augmentation.  Ulnar Veins: No evidence of thrombus. Normal compressibility, respiratory phasicity and response to augmentation.  Venous Reflux:  None visualized.  Other Findings:  Subcutaneous for arm edema evident.  IMPRESSION: Positive exam for right upper extremity  axillary and brachial acute occlusive DVT.  These results will be called to the ordering clinician or representative by the Radiology Department at the imaging location.   Electronically Signed   By: Greg [name] [name].D.   On: 09/23/2014 16:02      Medications:  Impression: Active DVT RUE  Principal Problem:   Acute combined systolic and diastolic congestive heart failure Active Problems:   Anemia of chronic  disease   Chronic renal insufficiency, stage III (moderate)   Abnormal EKG- known IVCD (LBBB type)   Fall   Acute respiratory failure with hypoxia   Atrial fibrillation/ atrilal flutter- new   Elevated troponin   Acute on chronic renal insufficiency   Hyperkalemia   Elevated LFTs   Type 2 diabetes mellitus with renal manifestations   Cardiomyopathy-etiology undetermined- EF 40-45% by echo Jan 2015   Anemia due to GI blood loss   Protein-calorie malnutrition, severe     Plan: Continue anti coagulations and dobutamine infusion  monitoring of renal  function electrolytes   Consultants: Cardiology and nephrology gastroenterology   Procedures   Antibiotics:                   Code Status:  Family Communication:    Disposition Plan continue dobutamine infusion monitor electrolytes and renal function and hemoglobin and hematocrit  Time spent: 30 minutes   LOS: 3 days   Greg [name]   09/25/2014, 10:43 AM

## 2014-09-25 NOTE — Progress Notes (Signed)
  Subjective:  Patient states he feeling better. He feels his breathing has improved. He had a bowel movement yesterday and was not black. He hasn't had a BM today. He is passing flatus. He denies chest or abdominal pain. His appetite is fair.   Objective: Blood pressure 138/64, pulse 66, temperature 97.4 F (36.3 C), temperature source Oral, resp. rate 20, height 5\' 11"  (1.803 m), weight 246 lb 7.6 oz (111.8 kg), SpO2 94 %. Patient is alert in no acute distress. Conjunctiva is pale and sclera is nonicteric. Cardiac exam with regular rhythm normal S1 and S2. No murmur or gallop noted. Lungs are clear to auscultation. He has bilateral pitting edema involving both flanks all the up to posterior lateral chest. Abdominal exam reveals normal bowel sounds soft abdomen with mild midepigastric tenderness. Liver edge is nonpalpable. He has edema and ecchymosis to left forearm. He also has 2+ pitting edema to both legs.  Labs/studies Results:   Recent Labs  09/24/14 0500 09/25/14 0452  WBC 7.3 7.4  HGB 8.3* 8.2*  HCT 28.1* 27.7*  PLT 140* 149*    BMET   Recent Labs  09/23/14 0403 09/24/14 0500 09/25/14 0452  NA 144 142 141  K 5.1 5.0 4.2  CL 113* 111 108  CO2 23 24 25   GLUCOSE 130* 152* 154*  BUN 73* 83* 86*  CREATININE 3.02* 3.30* 3.30*  CALCIUM 8.8 8.5 8.6    LFT   Recent Labs  09/23/14 0401 09/24/14 0500 09/25/14 0452  PROT 6.6 5.6* 6.0  ALBUMIN 3.4* 2.9* 3.0*  AST 686* 259* 146*  ALT 468* 360* 302*  ALKPHOS 62 64 62  BILITOT 0.7 0.5 0.7  BILIDIR 0.2 0.2 0.2  IBILI 0.5 0.3 0.5    PT/INR   Recent Labs  09/23/14 1957  LABPROT 20.2*  INR 1.71*     Assessment:  #1. GI bleed. It appears he had acute GI bleed few days prior to admission but none at the present time. H&H has remained stable over the last 24 hours urine though he is on heparin infusion. He is at risk for GERD and peptic ulcer disease and is on PPI for prophylaxis or empiric therapy of peptic  ulcer disease. He has not received PRBCs during this admission but he has received 4 units in the last 1 month. #2. Acute hepatocellular injury secondary to either ischemic hepatitis or congestive hepatopathy. Continued improvement in level of transaminases. #3. CHF. He is improving with current therapy including IV dobutamine carvedilol and furosemide. Urine out put over 3.6 L in the last 24 hours. #4. Acute DVT of left brachial and axillary vein. Patient is on heparin infusion.  Recommendations;  Will continue to monitor for evidence of acute GI bleed or drop in H&H. Will check H&H later this evening and in a.m. Continue by mouth pantoprazole.

## 2014-09-26 LAB — BASIC METABOLIC PANEL
ANION GAP: 9 (ref 5–15)
BUN: 86 mg/dL — AB (ref 6–23)
CO2: 28 mmol/L (ref 19–32)
CREATININE: 3.13 mg/dL — AB (ref 0.50–1.35)
Calcium: 8.6 mg/dL (ref 8.4–10.5)
Chloride: 105 mmol/L (ref 96–112)
GFR calc non Af Amer: 17 mL/min — ABNORMAL LOW (ref >90)
GFR, EST AFRICAN AMERICAN: 20 mL/min — AB (ref >90)
Glucose, Bld: 136 mg/dL — ABNORMAL HIGH (ref 70–99)
POTASSIUM: 4.1 mmol/L (ref 3.5–5.1)
Sodium: 142 mmol/L (ref 135–145)

## 2014-09-26 LAB — CBC
HEMATOCRIT: 28.2 % — AB (ref 39.0–52.0)
Hemoglobin: 8.2 g/dL — ABNORMAL LOW (ref 13.0–17.0)
MCH: 27.3 pg (ref 26.0–34.0)
MCHC: 29.1 g/dL — ABNORMAL LOW (ref 30.0–36.0)
MCV: 94 fL (ref 78.0–100.0)
PLATELETS: 145 10*3/uL — AB (ref 150–400)
RBC: 3 MIL/uL — AB (ref 4.22–5.81)
RDW: 16.9 % — ABNORMAL HIGH (ref 11.5–15.5)
WBC: 7.4 10*3/uL (ref 4.0–10.5)

## 2014-09-26 LAB — HEPARIN LEVEL (UNFRACTIONATED): HEPARIN UNFRACTIONATED: 0.41 [IU]/mL (ref 0.30–0.70)

## 2014-09-26 LAB — IRON AND TIBC
IRON: 18 ug/dL — AB (ref 42–165)
Saturation Ratios: 5 % — ABNORMAL LOW (ref 20–55)
TIBC: 386 ug/dL (ref 215–435)
UIBC: 368 ug/dL (ref 125–400)

## 2014-09-26 LAB — FERRITIN: Ferritin: 64 ng/mL (ref 22–322)

## 2014-09-26 MED ORDER — SODIUM CHLORIDE 0.9 % IV SOLN
510.0000 mg | Freq: Once | INTRAVENOUS | Status: AC
  Administered 2014-09-26: 510 mg via INTRAVENOUS
  Filled 2014-09-26: qty 17

## 2014-09-26 NOTE — Progress Notes (Signed)
Patient on dobutamine infusion and IV Lasix with less dyspnea good urine output currently on IV heparin creatinine improved to 3.13 hemoglobin steady at 8.2 LEVAN ALOIA [id number]:[id number] DOB: [birth year] DOA: 09/22/2014 PCP: [name], MD             Physical Exam: Blood pressure 109/41, pulse 69, temperature 97.6 F (36.4 C), temperature source Oral, resp. rate 18, height 5\' 11"  (1.803 m), weight 239 lb 3.2 oz (108.5 kg), SpO2 99 %. neck no JVD no carotid bruits no thyromegaly lungs diminished breath sounds in the bases scattered rhonchi no rales wheeze appreciable at present heart irregular regular no S3 auscultated abdomen soft nontender bowel sounds normoactive   Investigations:  Recent Results (from the past 240 hour(s))  MRSA PCR Screening     Status: None   Collection Time: 09/22/14  6:38 PM  Result Value Ref Range Status   MRSA by PCR NEGATIVE NEGATIVE Final    Comment:        The GeneXpert MRSA Assay (FDA approved for NASAL specimens only), is one component of a comprehensive MRSA colonization surveillance program. It is not intended to diagnose MRSA infection nor to guide or monitor treatment for MRSA infections.      Basic Metabolic Panel:  Recent Labs  09/24/14 0454  09/25/14 0446 09/25/14 0452 09/26/14 0506  NA  --   < >  --  141 142  K  --   < >  --  4.2 4.1  CL  --   < >  --  108 105  CO2  --   < >  --  25 28  GLUCOSE  --   < >  --  154* 136*  BUN  --   < >  --  86* 86*  CREATININE  --   < >  --  3.30* 3.13*  CALCIUM  --   < >  --  8.6 8.6  MG 2.2  --  2.1  --   --   < > = values in this interval not displayed. Liver Function Tests:  Recent Labs  09/24/14 0500 09/25/14 0452  AST 259* 146*  ALT 360* 302*  ALKPHOS 64 62  BILITOT 0.5 0.7  PROT 5.6* 6.0  ALBUMIN 2.9* 3.0*     CBC:  Recent Labs  09/25/14 0452 09/25/14 1750 09/26/14 0506  WBC 7.4  --  7.4  HGB 8.2* 8.5* 8.2*  HCT 27.7* 28.7* 28.2*  MCV 92.3  --  94.0  PLT  149*  --  145*    No results found.    Medications:   Impression:  Principal Problem:   Acute combined systolic and diastolic congestive heart failure Active Problems:   Anemia of chronic disease   Chronic renal insufficiency, stage III (moderate)   Abnormal EKG- known IVCD (LBBB type)   Fall   Acute respiratory failure with hypoxia   Atrial fibrillation/ atrilal flutter- new   Elevated troponin   Acute on chronic renal insufficiency   Hyperkalemia   Elevated LFTs   Type 2 diabetes mellitus with renal manifestations   Cardiomyopathy-etiology undetermined- EF 40-45% by echo Jan 2015   Anemia due to GI blood loss   Protein-calorie malnutrition, severe     Plan: Continue IV dobutamine infusion diuresis monitor hemoglobin renal function mildly improved  Consultants: Cardiology GI nephrology   Procedures   Antibiotics:  Code Status: Full   Family Communication:  Spoke with wife about mildly improved renal function and urine output  Disposition Plan continue IV dobutamine and diuresis monitor electrolytes urine output and hemoglobin  Time spent: 30 minutes   LOS: 4 days   Deryk Bozman M   09/26/2014, 11:32 AM

## 2014-09-26 NOTE — Progress Notes (Signed)
Subjective: Interval History: Patient denies any nausea or vomiting. Patient is feeling much better. He denies also any difficulty in breathing.  Objective: Vital signs in last 24 hours: Temp:  [97.4 F (36.3 C)-97.9 F (36.6 C)] 97.6 F (36.4 C) (02/07 0800) Pulse Rate:  [64-92] 70 (02/07 0730) Resp:  [12-31] 19 (02/07 0730) BP: (111-172)/(47-90) 168/64 mmHg (02/07 0730) SpO2:  [91 %-100 %] 98 % (02/07 0730) Weight:  [108.5 kg (239 lb 3.2 oz)] 108.5 kg (239 lb 3.2 oz) (02/07 0500) Weight change: -3.3 kg (-7 lb 4.4 oz)  Intake/Output from previous day: 02/06 0701 - 02/07 0700 In: 745.3 [P.O.:240; I.V.:505.3] Out: 3650 [Urine:3650] Intake/Output this shift: Total I/O In: -  Out: 800 [Urine:800]  Generally: Patient is alert and eating his breakfast. Chest decreased breath sound bilaterally but he doesn't have any wheezing or rales. Heart exam revealed iregular rate and rhythm no murmur Extremities: He has 2+ edema  Lab Results:  Recent Labs  09/25/14 0452 09/25/14 1750 09/26/14 0506  WBC 7.4  --  7.4  HGB 8.2* 8.5* 8.2*  HCT 27.7* 28.7* 28.2*  PLT 149*  --  145*   BMET:   Recent Labs  09/25/14 0452 09/26/14 0506  NA 141 142  K 4.2 4.1  CL 108 105  CO2 25 28  GLUCOSE 154* 136*  BUN 86* 86*  CREATININE 3.30* 3.13*  CALCIUM 8.6 8.6   No results for input(s): PTH in the last 72 hours. Iron Studies:   Recent Labs  09/24/14 11-Dec-2098  IRON 18*  TIBC 386  FERRITIN 64    Studies/Results: No results found.  I have reviewed the patient's current medications.  Assessment/Plan: Problem #1 CHF. Presently he is on Lasix and low-dose of dobutamine. His urine output continued to be good. Patient had about 3600 mL the last 24 hours. Even though patient is still has anasarca at this moment seems to be improving. Problem #2 acute on chronic renal failure. Presently his BUN and creatinine start improving. Problem #3 hyperkalemia his potassium has corrected Problem #4  anemia: His hemoglobin and hematocrit is low. His iron saturation is 5% and ferritin of 64 hence iron deficiency anemia. At this moment and edema of chronic disease cannot be ruled out. Problem #5 type 2 diabetes Problem #6 elevated troponin: Patient presently denies any chest pain. Problem #7 atrial fibrillation: His heart rate is fluctuating but presently seems to have controlled. Problem #8 elevated LFTs: Presently improving.  Problem #9  DVT: Patient on heparin. Plan: We'll continue his Lasix and dobutamine We'll give him IV iron 1 dose today. We'll check his basic metabolic panel, in the morning   LOS: 4 days   Chrisanna Mishra S 09/26/2014,8:35 AM

## 2014-09-26 NOTE — Progress Notes (Signed)
[facility] for Heparin Indication: DVT  Allergies  Allergen Reactions  . Celery Oil     vomiting    Patient Measurements: Height: 5\' 11"  (180.3 cm) Weight: 239 lb 3.2 oz (108.5 kg) IBW/kg (Calculated) : 75.3 Heparin Dosing Weight: 100 kg  Vital Signs: Temp: 97.6 F (36.4 C) (02/07 0800) Temp Source: Oral (02/07 0800) BP: 168/64 mmHg (02/07 0730) Pulse Rate: 70 (02/07 0730)  Labs:  Recent Labs  09/23/14 1957 09/24/14 0430  09/24/14 0500 09/25/14 0452 09/25/14 1750 09/26/14 0506  HGB  --   --   < > 8.3* 8.2* 8.5* 8.2*  HCT  --   --   < > 28.1* 27.7* 28.7* 28.2*  PLT  --   --   --  140* 149*  --  145*  LABPROT 20.2*  --   --   --   --   --   --   INR 1.71*  --   --   --   --   --   --   HEPARINUNFRC  --  0.56  --   --  0.67  --  0.41  CREATININE  --   --   --  3.30* 3.30*  --  3.13*  < > = values in this interval not displayed.  Estimated Creatinine Clearance: 23.6 mL/min (by C-G formula based on Cr of 3.13).   Medical History: Past Medical History  Diagnosis Date  . Type 2 diabetes mellitus   . Anemia of chronic disease   . CKD (chronic kidney disease) stage 3, GFR 30-59 ml/min   . Herpes zoster   . Obesity   . Exposure to hazardous substance   . Intermittent Leukopenia   . Essential hypertension   . Sleep apnea     Sleep Apnea score of 7  . Cardiomyopathy     LVEF 40-45% January 2015  . Left bundle branch block     Medications:  Scheduled:  . antiseptic oral rinse  7 mL Mouth Rinse BID  . atorvastatin  20 mg Oral q1800  . carvedilol  3.125 mg Oral BID WC  . feeding supplement (ENSURE COMPLETE)  237 mL Oral BID BM  . furosemide  80 mg Intravenous BID  . levothyroxine  125 mcg Oral QAC breakfast  . pantoprazole  40 mg Oral Daily    Assessment: [name] admitted with new onset Afib & transfusion dependent anemia, FOBT x3 as outpatient.  Anticoagulation was initially held pending GI work-up.  Venous doppler  ultrasound now positive for right upper extremity axillary and brachial acute occlusive DVT and pharmacy asked to initiate heparin.  CBC has stabilized.  No overt bleeding noted.  Heparin level therapeutic.   Goal of Therapy:  Heparin level 0.3-0.7 units/ml Monitor platelets by anticoagulation protocol: Yes   Plan:  Continue heparin infusion at 1500 units/hr Daily heparin level & CBC while on heparin F/U GI plans for colonoscopy/EGD F/U plan for long-term anticoagulation once ok with GI  Costantino Kohlbeck, Lavonia Drafts 09/26/2014,8:39 AM

## 2014-09-26 NOTE — Progress Notes (Signed)
  Subjective:  Patient is sleeping. According to his wife he has not had melena or rectal bleeding. His appetite is fair.   Objective: Blood pressure 114/52, pulse 69, temperature 98.3 F (36.8 C), temperature source Axillary, resp. rate 20, height 5\' 11"  (1.803 m), weight 239 lb 3.2 oz (108.5 kg), SpO2 96 %.  Persistent pitting edema involving both flanks. This area is not as tense as before. Abdomen is full but soft and nontender without organomegaly or masses. He remains with edema and ecchymosis to left forearm and arm. He has 3+ pitting edema involving both legs and yes to areas seeking clear fluid one on each leg.  Labs/studies Results:   Recent Labs  09/24/14 0500 09/25/14 0452 09/25/14 1750 09/26/14 0506  WBC 7.3 7.4  --  7.4  HGB 8.3* 8.2* 8.5* 8.2*  HCT 28.1* 27.7* 28.7* 28.2*  PLT 140* 149*  --  145*    BMET   Recent Labs  09/24/14 0500 09/25/14 0452 09/26/14 0506  NA 142 141 142  K 5.0 4.2 4.1  CL 111 108 105  CO2 24 25 28   GLUCOSE 152* 154* 136*  BUN 83* 86* 86*  CREATININE 3.30* 3.30* 3.13*  CALCIUM 8.5 8.6 8.6    LFT   Recent Labs  09/24/14 0500 09/25/14 0452  PROT 5.6* 6.0  ALBUMIN 2.9* 3.0*  AST 259* 146*  ALT 360* 302*  ALKPHOS 64 62  BILITOT 0.5 0.7  BILIDIR 0.2 0.2  IBILI 0.3 0.5    PT/INR   Recent Labs  09/23/14 1957  LABPROT 20.2*  INR 1.71*     Assessment:  #1. Recent GI bleed. He had melena a few days prior to admission. H&H is low but stable while he is on anti-coagulation for DVT. GI workup is on hold given cardiac issues. #2. Acute hepatocellular injury either secondary to ischemia or heart failure. Significant improvement in transaminases as of yesterday. #3. CHF. He is having excellent diureses but he has a long way to go. #4. Acute DVT of left brachial and exam remain in treated with heparin infusion.   Recommendations;  Will continue to monitor closely for evidence of acute GI bleed. CBC and LFTs in  a.m.

## 2014-09-27 LAB — HEPATIC FUNCTION PANEL
ALK PHOS: 55 U/L (ref 39–117)
ALT: 199 U/L — ABNORMAL HIGH (ref 0–53)
AST: 70 U/L — ABNORMAL HIGH (ref 0–37)
Albumin: 2.8 g/dL — ABNORMAL LOW (ref 3.5–5.2)
Bilirubin, Direct: 0.2 mg/dL (ref 0.0–0.5)
Indirect Bilirubin: 0.7 mg/dL (ref 0.3–0.9)
Total Bilirubin: 0.9 mg/dL (ref 0.3–1.2)
Total Protein: 5.8 g/dL — ABNORMAL LOW (ref 6.0–8.3)

## 2014-09-27 LAB — CBC
HCT: 27.2 % — ABNORMAL LOW (ref 39.0–52.0)
HEMOGLOBIN: 8.2 g/dL — AB (ref 13.0–17.0)
MCH: 27.7 pg (ref 26.0–34.0)
MCHC: 30.1 g/dL (ref 30.0–36.0)
MCV: 91.9 fL (ref 78.0–100.0)
Platelets: 154 10*3/uL (ref 150–400)
RBC: 2.96 MIL/uL — AB (ref 4.22–5.81)
RDW: 17 % — AB (ref 11.5–15.5)
WBC: 7.3 10*3/uL (ref 4.0–10.5)

## 2014-09-27 LAB — BASIC METABOLIC PANEL
Anion gap: 9 (ref 5–15)
BUN: 87 mg/dL — AB (ref 6–23)
CO2: 31 mmol/L (ref 19–32)
Calcium: 8.8 mg/dL (ref 8.4–10.5)
Chloride: 101 mmol/L (ref 96–112)
Creatinine, Ser: 2.95 mg/dL — ABNORMAL HIGH (ref 0.50–1.35)
GFR calc non Af Amer: 19 mL/min — ABNORMAL LOW (ref >90)
GFR, EST AFRICAN AMERICAN: 22 mL/min — AB (ref >90)
Glucose, Bld: 134 mg/dL — ABNORMAL HIGH (ref 70–99)
Potassium: 3.9 mmol/L (ref 3.5–5.1)
SODIUM: 141 mmol/L (ref 135–145)

## 2014-09-27 LAB — HEPARIN LEVEL (UNFRACTIONATED): Heparin Unfractionated: 0.44 IU/mL (ref 0.30–0.70)

## 2014-09-27 NOTE — Progress Notes (Signed)
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 No nausea or vomiting Objective: Vital signs in last 24 hours: Temp:  [97.6 F (36.4 C)-98.3 F (36.8 C)] 98.1 F (36.7 C) (02/08 0400) Pulse Rate:  [34-83] 71 (02/08 0700) Resp:  [14-28] 16 (02/08 0700) BP: (96-133)/(40-96) 102/40 mmHg (02/08 0700) SpO2:  [85 %-99 %] 94 % (02/08 0700) Weight:  [105.2 kg (231 lb 14.8 oz)] 105.2 kg (231 lb 14.8 oz) (02/08 0500) Weight change: -3.3 kg (-7 lb 4.4 oz)  Intake/Output from previous day: 02/07 0701 - 02/08 0700 In: 1083.6 [P.O.:510; I.V.:456.6; IV Piggyback:117] Out: 12-14-3973 [Urine:3975] Intake/Output this shift:    Generally: Patient is alert and eating his breakfast. Chest decreased breath sound bilaterally but he doesn't have any wheezing or rales. Heart exam revealed iregular rate and rhythm no murmur Extremities: He has 2+ edema  Lab Results:  Recent Labs  09/26/14 0506 09/27/14 0537  WBC 7.4 7.3  HGB 8.2* 8.2*  HCT 28.2* 27.2*  PLT 145* 154   BMET:   Recent Labs  09/26/14 0506 09/27/14 0537  NA 142 141  K 4.1 3.9  CL 105 101  CO2 28 31  GLUCOSE 136* 134*  BUN 86* 87*  CREATININE 3.13* 2.95*  CALCIUM 8.6 8.8   No results for input(s): PTH in the last 72 hours. Iron Studies:   Recent Labs  09/24/14 2098/12/14  IRON 18*  TIBC 386  FERRITIN 64    Studies/Results: No results found.  I have reviewed the patient's current medications.  Assessment/Plan: Problem #1 CHF. Presently he is on Lasix and low-dose of dobutamine. His urine output continued to be good. Patient had about 3900 mL the last 24 hours. His anasarca is getting bettter Problem #2 acute on chronic renal failure.  P in his renal function is progressively improving problem #3 hyperkalemia his potassium has corrected Problem #4 anemia: His hemoglobin patient with history of GI bleeding and iron deficiency anemia. Patient has received a dose of IV iron.  Problem #5 type 2  diabetes Problem #6 elevated troponin: Patient presently denies any chest pain. Problem #7 atrial fibrillation: His heart rate is fluctuating but presently seems to have controlled. Problem #8 elevated LFTs: Presently improving.  Problem #9  DVT: Patient on heparin. Plan: We'll continue his Lasix and dobutamine We'll check his basic metabolic panel in the morning.  We'll check his basic metabolic panel, in the morning   LOS: 5 days   Jalani Rominger S 09/27/2014,7:56 AM

## 2014-09-27 NOTE — Progress Notes (Signed)
[facility] for Heparin Indication: DVT  Allergies  Allergen Reactions  . Celery Oil     vomiting    Patient Measurements: Height: 5\' 11"  (180.3 cm) Weight: 231 lb 14.8 oz (105.2 kg) IBW/kg (Calculated) : 75.3 Heparin Dosing Weight: 100 kg  Vital Signs: Temp: 98.1 F (36.7 C) (02/08 0400) Temp Source: Oral (02/08 0400) BP: 102/40 mmHg (02/08 0700) Pulse Rate: 71 (02/08 0700)  Labs:  Recent Labs  09/25/14 0452 09/25/14 1750 09/26/14 0506 09/27/14 0537  HGB 8.2* 8.5* 8.2* 8.2*  HCT 27.7* 28.7* 28.2* 27.2*  PLT 149*  --  145* 154  HEPARINUNFRC 0.67  --  0.41 0.44  CREATININE 3.30*  --  3.13* 2.95*    Estimated Creatinine Clearance: 24.7 mL/min (by C-G formula based on Cr of 2.95).   Medical History: Past Medical History  Diagnosis Date  . Type 2 diabetes mellitus   . Anemia of chronic disease   . CKD (chronic kidney disease) stage 3, GFR 30-59 ml/min   . Herpes zoster   . Obesity   . Exposure to hazardous substance   . Intermittent Leukopenia   . Essential hypertension   . Sleep apnea     Sleep Apnea score of 7  . Cardiomyopathy     LVEF 40-45% January 2015  . Left bundle branch block     Medications:  Scheduled:  . antiseptic oral rinse  7 mL Mouth Rinse BID  . atorvastatin  20 mg Oral q1800  . carvedilol  3.125 mg Oral BID WC  . feeding supplement (ENSURE COMPLETE)  237 mL Oral BID BM  . furosemide  80 mg Intravenous BID  . levothyroxine  125 mcg Oral QAC breakfast  . pantoprazole  40 mg Oral Daily    Assessment: [name] admitted with new onset Afib & transfusion dependent anemia, FOBT x3 as outpatient.  Anticoagulation was initially held pending GI work-up.  Venous doppler ultrasound now positive for right upper extremity axillary and brachial acute occlusive DVT and pharmacy asked to initiate heparin.  CBC has stabilized.  No overt bleeding noted.  Heparin level therapeutic.   Goal of Therapy:  Heparin  level 0.3-0.7 units/ml Monitor platelets by anticoagulation protocol: Yes   Plan:  Continue heparin infusion at 1500 units/hr Daily heparin level & CBC while on heparin F/U GI plans for colonoscopy/EGD F/U plan for long-term anticoagulation once ok with GI  Biagio Borg 09/27/2014,7:45 AM

## 2014-09-27 NOTE — Progress Notes (Signed)
Primary cardiologist: Dr. Carlyle Dolly  Seen for followup: Atrial arrhythmia, acute on chronic systolic heart failure  Subjective:    Feels somewhat better, less leg edema, still has poor appetite. No chest pain.  Objective:   Temp:  [97.6 F (36.4 C)-98.3 F (36.8 C)] 97.6 F (36.4 C) (02/08 0730) Pulse Rate:  [34-83] 71 (02/08 0700) Resp:  [14-28] 16 (02/08 0700) BP: (96-133)/(40-96) 102/40 mmHg (02/08 0700) SpO2:  [85 %-99 %] 94 % (02/08 0700) Weight:  [231 lb 14.8 oz (105.2 kg)] 231 lb 14.8 oz (105.2 kg) (02/08 0500) Last BM Date: 09/22/14  Filed Weights   09/25/14 0530 09/26/14 0500 09/27/14 0500  Weight: 246 lb 7.6 oz (111.8 kg) 239 lb 3.2 oz (108.5 kg) 231 lb 14.8 oz (105.2 kg)    Intake/Output Summary (Last 24 hours) at 09/27/14 0831 Last data filed at 09/27/14 0600  Gross per 24 hour  Intake 1083.6 ml  Output   3175 ml  Net -2091.4 ml    Telemetry: Appears to be rate controlled atrial flutter with 3:1 block.  Exam:  General: Overweight, chronically ill-appearing, no distress.  Lungs: Diminished breath sounds at the bases.  Cardiac: Regular rate and rhythm, no S3 gallop.  Abdomen: Obese, nontender.  Extremities: Improving leg edema.  Lab Results:  Basic Metabolic Panel:  Recent Labs Lab 09/24/14 0454  09/25/14 0446 09/25/14 0452 09/26/14 0506 09/27/14 0537  NA  --   < >  --  141 142 141  K  --   < >  --  4.2 4.1 3.9  CL  --   < >  --  108 105 101  CO2  --   < >  --  25 28 31   GLUCOSE  --   < >  --  154* 136* 134*  BUN  --   < >  --  86* 86* 87*  CREATININE  --   < >  --  3.30* 3.13* 2.95*  CALCIUM  --   < >  --  8.6 8.6 8.8  MG 2.2  --  2.1  --   --   --   < > = values in this interval not displayed.  Liver Function Tests:  Recent Labs Lab 09/24/14 0500 09/25/14 0452 09/27/14 0537  AST 259* 146* 70*  ALT 360* 302* 199*  ALKPHOS 64 62 55  BILITOT 0.5 0.7 0.9  PROT 5.6* 6.0 5.8*  ALBUMIN 2.9* 3.0* 2.8*     CBC:  Recent Labs Lab 09/25/14 0452 09/25/14 1750 09/26/14 0506 09/27/14 0537  WBC 7.4  --  7.4 7.3  HGB 8.2* 8.5* 8.2* 8.2*  HCT 27.7* 28.7* 28.2* 27.2*  MCV 92.3  --  94.0 91.9  PLT 149*  --  145* 154    Cardiac Enzymes:  Recent Labs Lab 09/22/14 1554 09/22/14 2149 09/23/14 0403  TROPONINI 1.13* 1.33* 1.32*    Coagulation:  Recent Labs Lab 09/22/14 0730 09/23/14 1957  INR 1.26 1.71*     Medications:   Scheduled Medications: . antiseptic oral rinse  7 mL Mouth Rinse BID  . atorvastatin  20 mg Oral q1800  . carvedilol  3.125 mg Oral BID WC  . feeding supplement (ENSURE COMPLETE)  237 mL Oral BID BM  . furosemide  80 mg Intravenous BID  . levothyroxine  125 mcg Oral QAC breakfast  . pantoprazole  40 mg Oral Daily     Infusions: . DOBUTamine 2.5 mcg/kg/min (09/26/14 2237)  . heparin  1,500 Units/hr (09/26/14 2237)     PRN Medications:  sodium chloride, acetaminophen, ondansetron (ZOFRAN) IV, sodium chloride   Assessment:   1. Acute on chronic systolic heart failure, LVEF now in the range of 20-25% by follow-up echocardiogram. Started on low-dose dobutamine by Dr. Bronson Ing on February 5. Fortunately, heart rhythm has been stable in terms of rate control, diuresis continues, creatinine has come down somewhat to 2.9.  2. Atrial flutter with controlled ventricular response. CHADSVASC score is 6. Suboptimal candidate for long-term anticoagulation, but will continue to reconsider depending on clinical course and workup for possible GI bleed.  3. Right upper extremity DVT, currently on IV heparin.  4. Acute on chronic renal failure, creatinine 2.9 at this point. Nephrology following.  5. Anemia with history of GI bleed. Gastroenterology evaluating, patient for possible EGD and colonoscopy.   Plan/Discussion:    Continue IV Lasix and low-dose dobutamine for now since urine output has been good and creatinine coming down. Systolic blood pressure in  the 100-120 range - watch trend, may eventually try to add very low-dose hydralazine for afterload reduction eventually. Will hold off on the low dose Coreg since he is on dobutamine concurrently. Follow-up ECG to confirm rhythm.   Satira Sark, M.D., F.A.C.C.

## 2014-09-27 NOTE — Progress Notes (Signed)
Subjective: The events of the last 3 days are noted. He has not had any further bleeding seen. He has a DVT now. He has much worse cardiac function with his ejection fraction around 25%. He is on dobutamine. His renal function has improved somewhat. He says he feels okay. He is not eating very well.  Objective: Vital signs in last 24 hours: Temp:  [97.8 F (36.6 C)-98.3 F (36.8 C)] 98.1 F (36.7 C) (02/08 0400) Pulse Rate:  [34-83] 71 (02/08 0700) Resp:  [14-28] 16 (02/08 0700) BP: (96-133)/(40-96) 102/40 mmHg (02/08 0700) SpO2:  [85 %-99 %] 94 % (02/08 0700) Weight:  [105.2 kg (231 lb 14.8 oz)] 105.2 kg (231 lb 14.8 oz) (02/08 0500) Weight change: -3.3 kg (-7 lb 4.4 oz) Last BM Date: 09/22/14  Intake/Output from previous day: 02/07 0701 - 02/08 0700 In: 1083.6 [P.O.:510; I.V.:456.6; IV Piggyback:117] Out: 3975 [Urine:3975]  PHYSICAL EXAM General appearance: He wakes up and talks to me then goes back to sleep almost immediately. Resp: clear to auscultation bilaterally Cardio: His heart is regular and I don't hear a gallop. His heart rate about 75-80 GI: soft, non-tender; bowel sounds normal; no masses,  no organomegaly Extremities: venous stasis dermatitis noted  Lab Results:  Results for orders placed or performed during the hospital encounter of 09/22/14 (from the past 48 hour(s))  Hemoglobin and hematocrit, blood     Status: Abnormal   Collection Time: 09/25/14  5:50 PM  Result Value Ref Range   Hemoglobin 8.5 (L) 13.0 - 17.0 g/dL   HCT 28.7 (L) 39.0 - 52.0 %  Heparin level (unfractionated)     Status: None   Collection Time: 09/26/14  5:06 AM  Result Value Ref Range   Heparin Unfractionated 0.41 0.30 - 0.70 IU/mL    Comment:        IF HEPARIN RESULTS ARE BELOW EXPECTED VALUES, AND PATIENT DOSAGE HAS BEEN CONFIRMED, SUGGEST FOLLOW UP TESTING OF ANTITHROMBIN III LEVELS.   CBC     Status: Abnormal   Collection Time: 09/26/14  5:06 AM  Result Value Ref Range   WBC  7.4 4.0 - 10.5 K/uL   RBC 3.00 (L) 4.22 - 5.81 MIL/uL   Hemoglobin 8.2 (L) 13.0 - 17.0 g/dL   HCT 28.2 (L) 39.0 - 52.0 %   MCV 94.0 78.0 - 100.0 fL   MCH 27.3 26.0 - 34.0 pg   MCHC 29.1 (L) 30.0 - 36.0 g/dL   RDW 16.9 (H) 11.5 - 15.5 %   Platelets 145 (L) 150 - 400 K/uL  Basic metabolic panel     Status: Abnormal   Collection Time: 09/26/14  5:06 AM  Result Value Ref Range   Sodium 142 135 - 145 mmol/L   Potassium 4.1 3.5 - 5.1 mmol/L   Chloride 105 96 - 112 mmol/L   CO2 28 19 - 32 mmol/L   Glucose, Bld 136 (H) 70 - 99 mg/dL   BUN 86 (H) 6 - 23 mg/dL   Creatinine, Ser 3.13 (H) 0.50 - 1.35 mg/dL   Calcium 8.6 8.4 - 10.5 mg/dL   GFR calc non Af Amer 17 (L) >90 mL/min   GFR calc Af Amer 20 (L) >90 mL/min    Comment: (NOTE) The eGFR has been calculated using the CKD EPI equation. This calculation has not been validated in all clinical situations. eGFR's persistently <90 mL/min signify possible Chronic Kidney Disease.    Anion gap 9 5 - 15  Heparin level (unfractionated)       Status: None   Collection Time: 09/27/14  5:37 AM  Result Value Ref Range   Heparin Unfractionated 0.44 0.30 - 0.70 IU/mL    Comment:        IF HEPARIN RESULTS ARE BELOW EXPECTED VALUES, AND PATIENT DOSAGE HAS BEEN CONFIRMED, SUGGEST FOLLOW UP TESTING OF ANTITHROMBIN III LEVELS.   CBC     Status: Abnormal   Collection Time: 09/27/14  5:37 AM  Result Value Ref Range   WBC 7.3 4.0 - 10.5 K/uL   RBC 2.96 (L) 4.22 - 5.81 MIL/uL   Hemoglobin 8.2 (L) 13.0 - 17.0 g/dL   HCT 27.2 (L) 39.0 - 52.0 %   MCV 91.9 78.0 - 100.0 fL   MCH 27.7 26.0 - 34.0 pg   MCHC 30.1 30.0 - 36.0 g/dL   RDW 17.0 (H) 11.5 - 15.5 %   Platelets 154 150 - 400 K/uL  Basic metabolic panel     Status: Abnormal   Collection Time: 09/27/14  5:37 AM  Result Value Ref Range   Sodium 141 135 - 145 mmol/L   Potassium 3.9 3.5 - 5.1 mmol/L   Chloride 101 96 - 112 mmol/L   CO2 31 19 - 32 mmol/L   Glucose, Bld 134 (H) 70 - 99 mg/dL   BUN  87 (H) 6 - 23 mg/dL   Creatinine, Ser 2.95 (H) 0.50 - 1.35 mg/dL   Calcium 8.8 8.4 - 10.5 mg/dL   GFR calc non Af Amer 19 (L) >90 mL/min   GFR calc Af Amer 22 (L) >90 mL/min    Comment: (NOTE) The eGFR has been calculated using the CKD EPI equation. This calculation has not been validated in all clinical situations. eGFR's persistently <90 mL/min signify possible Chronic Kidney Disease.    Anion gap 9 5 - 15  Hepatic function panel     Status: Abnormal   Collection Time: 09/27/14  5:37 AM  Result Value Ref Range   Total Protein 5.8 (L) 6.0 - 8.3 g/dL   Albumin 2.8 (L) 3.5 - 5.2 g/dL   AST 70 (H) 0 - 37 U/L   ALT 199 (H) 0 - 53 U/L   Alkaline Phosphatase 55 39 - 117 U/L   Total Bilirubin 0.9 0.3 - 1.2 mg/dL   Bilirubin, Direct 0.2 0.0 - 0.5 mg/dL   Indirect Bilirubin 0.7 0.3 - 0.9 mg/dL    ABGS No results for input(s): PHART, PO2ART, TCO2, HCO3 in the last 72 hours.  Invalid input(s): PCO2 CULTURES Recent Results (from the past 240 hour(s))  MRSA PCR Screening     Status: None   Collection Time: 09/22/14  6:38 PM  Result Value Ref Range Status   MRSA by PCR NEGATIVE NEGATIVE Final    Comment:        The GeneXpert MRSA Assay (FDA approved for NASAL specimens only), is one component of a comprehensive MRSA colonization surveillance program. It is not intended to diagnose MRSA infection nor to guide or monitor treatment for MRSA infections.    Studies/Results: No results found.  Medications:  Prior to Admission:  Prescriptions prior to admission  Medication Sig Dispense Refill Last Dose  . atorvastatin (LIPITOR) 20 MG tablet Take 20 mg by mouth daily.   09/21/2014 at Unknown time  . levothyroxine (SYNTHROID, LEVOTHROID) 125 MCG tablet Take 125 mcg by mouth daily before breakfast.   09/21/2014 at Unknown time  . Multiple Vitamins-Minerals (CENTRUM SILVER PO) Take 1 tablet by mouth daily.   09/21/2014 at Unknown   time  . aspirin EC 81 MG tablet Take 81 mg by mouth every  other day.    09/20/2014  . carvedilol (COREG) 3.125 MG tablet Take 1 tablet (3.125 mg total) by mouth 2 (two) times daily with a meal. 60 tablet 12 Taking   Scheduled: . antiseptic oral rinse  7 mL Mouth Rinse BID  . atorvastatin  20 mg Oral q1800  . carvedilol  3.125 mg Oral BID WC  . feeding supplement (ENSURE COMPLETE)  237 mL Oral BID BM  . furosemide  80 mg Intravenous BID  . levothyroxine  125 mcg Oral QAC breakfast  . pantoprazole  40 mg Oral Daily   Continuous: . DOBUTamine 2.5 mcg/kg/min (09/26/14 2237)  . heparin 1,500 Units/hr (09/26/14 2237)   DTO:IZTIWP chloride, acetaminophen, ondansetron (ZOFRAN) IV, sodium chloride  Assesment: He was admitted after a fall. He has acute combined systolic and diastolic CHF. He had new onset atrial flutter flutter/fibrillation. He had acute hypoxic respiratory failure and that is clearly better. His ejection fraction which was 40% or better a year ago is now down to 20-25%. He's had acute blood loss anemia plus anemia of chronic disease and that is stable. He had acute on chronic renal insufficiency and his renal function has improved. He has protein calorie malnutrition and is not doing very well with oral intake. Principal Problem:   Acute combined systolic and diastolic congestive heart failure Active Problems:   Anemia of chronic disease   Chronic renal insufficiency, stage III (moderate)   Abnormal EKG- known IVCD (LBBB type)   Fall   Acute respiratory failure with hypoxia   Atrial fibrillation/ atrilal flutter- new   Elevated troponin   Acute on chronic renal insufficiency   Hyperkalemia   Elevated LFTs   Type 2 diabetes mellitus with renal manifestations   Cardiomyopathy-etiology undetermined- EF 40-45% by echo Jan 2015   Anemia due to GI blood loss   Protein-calorie malnutrition, severe    Plan: Continue current treatments. Try to increase intake of food. No other new medications or treatments now.    LOS: 5 days    Aslee Such L 09/27/2014, 8:02 AM

## 2014-09-27 NOTE — Progress Notes (Signed)
    Subjective: Denies abdominal pain, N/V. No overt signs of GI bleeding. Remains on dobutamine and heparin. Poor appetite and po intake.   Objective: Vital signs in last 24 hours: Temp:  [97.8 F (36.6 C)-98.3 F (36.8 C)] 98.1 F (36.7 C) (02/08 0400) Pulse Rate:  [34-83] 71 (02/08 0700) Resp:  [14-28] 16 (02/08 0700) BP: (96-133)/(40-96) 102/40 mmHg (02/08 0700) SpO2:  [85 %-99 %] 94 % (02/08 0700) Weight:  [231 lb 14.8 oz (105.2 kg)] 231 lb 14.8 oz (105.2 kg) (02/08 0500) Last BM Date: 09/22/14 General:   Alert and oriented, pleasant Head:  Normocephalic and atraumatic. Abdomen:  Bowel sounds present, soft, non-tender, non-distended. Obese.  No HSM or hernias noted.  Neurologic:  Alert and  oriented x4;  grossly normal neurologically. Skin:  Warm and dry, intact without significant lesions.  Psych:  Alert and cooperative. Normal mood and affect.  Intake/Output from previous day: 02/07 0701 - 02/08 0700 In: 1083.6 [P.O.:510; I.V.:456.6; IV Piggyback:117] Out: 3975 [Urine:3975] Intake/Output this shift:    Lab Results:  Recent Labs  09/25/14 0452 09/25/14 1750 09/26/14 0506 09/27/14 0537  WBC 7.4  --  7.4 7.3  HGB 8.2* 8.5* 8.2* 8.2*  HCT 27.7* 28.7* 28.2* 27.2*  PLT 149*  --  145* 154   BMET  Recent Labs  09/25/14 0452 09/26/14 0506 09/27/14 0537  NA 141 142 141  K 4.2 4.1 3.9  CL 108 105 101  CO2 25 28 31   GLUCOSE 154* 136* 134*  BUN 86* 86* 87*  CREATININE 3.30* 3.13* 2.95*  CALCIUM 8.6 8.6 8.8   LFT  Recent Labs  09/25/14 0452 09/27/14 0537  PROT 6.0 5.8*  ALBUMIN 3.0* 2.8*  AST 146* 70*  ALT 302* 199*  ALKPHOS 62 55  BILITOT 0.7 0.9  BILIDIR 0.2 0.2  IBILI 0.5 0.7   Assessment:  81 year old gentleman admitted with acute on chronic systolic heart failure (LVEF in 20-25% range), atrial flutter, demand ischemia, DVT of left upper extremity on heparin, who also has history of recent transfusion dependent anemia. Early January Hgb in the  5 range, with 4 units PRBCs over past 4 weeks but none this admission. Known to be heme positive but without overt signs of GI bleeding at this time. No prior EGD or colonoscopy.  Hgb remaining stable in the 8 range.   Elevated LFTs: likely secondary to ischemia or heart failure. Continues to improve.   Plan: Remain on PPI for GI prophylaxis Monitor for any overt GI bleeding Hopeful EGD/colonoscopy closer to discharge Will follow peripherally with you  Orvil Feil, ANP-BC [facility]    LOS: 5 days    09/27/2014, 8:08 AM    Attending note:  Patient seen and examined in the ICU. Agree with above assessment and recommendations

## 2014-09-28 DIAGNOSIS — I483 Typical atrial flutter: Secondary | ICD-10-CM

## 2014-09-28 LAB — CBC
HEMATOCRIT: 28.1 % — AB (ref 39.0–52.0)
Hemoglobin: 8.3 g/dL — ABNORMAL LOW (ref 13.0–17.0)
MCH: 26.9 pg (ref 26.0–34.0)
MCHC: 29.5 g/dL — AB (ref 30.0–36.0)
MCV: 91.2 fL (ref 78.0–100.0)
Platelets: 168 10*3/uL (ref 150–400)
RBC: 3.08 MIL/uL — ABNORMAL LOW (ref 4.22–5.81)
RDW: 17.2 % — AB (ref 11.5–15.5)
WBC: 7.3 10*3/uL (ref 4.0–10.5)

## 2014-09-28 LAB — BASIC METABOLIC PANEL
Anion gap: 5 (ref 5–15)
BUN: 83 mg/dL — AB (ref 6–23)
CO2: 32 mmol/L (ref 19–32)
CREATININE: 2.72 mg/dL — AB (ref 0.50–1.35)
Calcium: 8.3 mg/dL — ABNORMAL LOW (ref 8.4–10.5)
Chloride: 100 mmol/L (ref 96–112)
GFR calc Af Amer: 24 mL/min — ABNORMAL LOW (ref >90)
GFR, EST NON AFRICAN AMERICAN: 21 mL/min — AB (ref >90)
GLUCOSE: 160 mg/dL — AB (ref 70–99)
POTASSIUM: 3.9 mmol/L (ref 3.5–5.1)
Sodium: 137 mmol/L (ref 135–145)

## 2014-09-28 LAB — HEPARIN LEVEL (UNFRACTIONATED): Heparin Unfractionated: 0.53 IU/mL (ref 0.30–0.70)

## 2014-09-28 NOTE — Progress Notes (Signed)
Primary cardiologist: Dr. Carlyle Dolly  Seen for followup: Atrial arrhythmia, acute on chronic systolic heart failure  Subjective:    Eating breakfast. Continues to feel better by the day, less leg swelling and abdominal swelling.  Objective:   Temp:  [97.6 F (36.4 C)-98.3 F (36.8 C)] 97.8 F (36.6 C) (02/09 0400) Pulse Rate:  [48-85] 71 (02/09 0630) Resp:  [14-33] 20 (02/09 0630) BP: (88-140)/(43-86) 113/48 mmHg (02/09 0630) SpO2:  [85 %-99 %] 96 % (02/09 0630) FiO2 (%):  [40 %] 40 % (02/08 0900) Weight:  [226 lb 3.1 oz (102.6 kg)] 226 lb 3.1 oz (102.6 kg) (02/09 0400) Last BM Date: 09/22/14  Filed Weights   09/26/14 0500 09/27/14 0500 09/28/14 0400  Weight: 239 lb 3.2 oz (108.5 kg) 231 lb 14.8 oz (105.2 kg) 226 lb 3.1 oz (102.6 kg)    Intake/Output Summary (Last 24 hours) at 09/28/14 0838 Last data filed at 09/28/14 0800  Gross per 24 hour  Intake 1993.6 ml  Output   5075 ml  Net -3081.4 ml    Telemetry: Rate controlled atrial flutter with 3:1 block.  Exam:  General: Overweight, chronically ill-appearing, no distress.  Lungs: Diminished breath sounds at the bases.  Cardiac: Regular rate and rhythm, no S3 gallop.  Abdomen: Obese, nontender.  Extremities: Improving leg edema.  Lab Results:  Basic Metabolic Panel:  Recent Labs Lab 09/24/14 0454  09/25/14 0446  09/26/14 0506 09/27/14 0537 09/28/14 0452  NA  --   < >  --   < > 142 141 137  K  --   < >  --   < > 4.1 3.9 3.9  CL  --   < >  --   < > 105 101 100  CO2  --   < >  --   < > 28 31 32  GLUCOSE  --   < >  --   < > 136* 134* 160*  BUN  --   < >  --   < > 86* 87* 83*  CREATININE  --   < >  --   < > 3.13* 2.95* 2.72*  CALCIUM  --   < >  --   < > 8.6 8.8 8.3*  MG 2.2  --  2.1  --   --   --   --   < > = values in this interval not displayed.  Liver Function Tests:  Recent Labs Lab 09/24/14 0500 09/25/14 0452 09/27/14 0537  AST 259* 146* 70*  ALT 360* 302* 199*  ALKPHOS 64 62 55    BILITOT 0.5 0.7 0.9  PROT 5.6* 6.0 5.8*  ALBUMIN 2.9* 3.0* 2.8*    CBC:  Recent Labs Lab 09/26/14 0506 09/27/14 0537 09/28/14 0452  WBC 7.4 7.3 7.3  HGB 8.2* 8.2* 8.3*  HCT 28.2* 27.2* 28.1*  MCV 94.0 91.9 91.2  PLT 145* 154 168    Cardiac Enzymes:  Recent Labs Lab 09/22/14 1554 09/22/14 2149 09/23/14 0403  TROPONINI 1.13* 1.33* 1.32*    Coagulation:  Recent Labs Lab 09/22/14 0730 09/23/14 1957  INR 1.26 1.71*    ECG: Atrial flutter with 3:1 block and left bundle branch block.   Medications:   Scheduled Medications: . antiseptic oral rinse  7 mL Mouth Rinse BID  . atorvastatin  20 mg Oral q1800  . feeding supplement (ENSURE COMPLETE)  237 mL Oral BID BM  . furosemide  80 mg Intravenous BID  . levothyroxine  125 mcg Oral QAC breakfast  . pantoprazole  40 mg Oral Daily    Infusions: . DOBUTamine 2.5 mcg/kg/min (09/27/14 1700)  . heparin 1,500 Units/hr (09/27/14 2206)    PRN Medications: sodium chloride, acetaminophen, ondansetron (ZOFRAN) IV, sodium chloride   Assessment:   1. Acute on chronic systolic heart failure, LVEF now in the range of 20-25% by follow-up echocardiogram. Started on low-dose dobutamine by Dr. Bronson Ing on February 5. Heart rhythm has been stable in terms of rate control, good diuresis continues, LFTs are improving (question hepatic congestion), and creatinine also continues to improve. Weight coming down.  2. Atrial flutter with controlled ventricular response. CHADSVASC score is 6. Suboptimal candidate for long-term anticoagulation, but will continue to reconsider depending on clinical course and workup for possible GI bleed.  3. Right upper extremity DVT, currently on IV heparin.  4. Acute on chronic renal failure, creatinine 2.7 at this point. Nephrology following.  5. Anemia with history of GI bleed. Gastroenterology evaluating, patient for possible EGD and colonoscopy.   Plan/Discussion:    Continue IV Lasix and  low-dose dobutamine with continued clinical improvement. Systolic blood pressure in the 100-120 range - hold off on hydralazine at this point although will reconsider later. GI following, may consider EGD/colonoscopy when patient closer to discharge. This will help guide decision about anticoagulation.  Satira Sark, M.D., F.A.C.C.

## 2014-09-28 NOTE — Progress Notes (Signed)
Subjective: He is more awake and alert. He says he ate a little better yesterday. He remains on dobutamine.  Objective: Vital signs in last 24 hours: Temp:  [97.6 F (36.4 C)-98.3 F (36.8 C)] 97.8 F (36.6 C) (02/09 0400) Pulse Rate:  [48-85] 71 (02/09 0630) Resp:  [14-28] 20 (02/09 0630) BP: (88-140)/(43-86) 113/48 mmHg (02/09 0630) SpO2:  [85 %-99 %] 96 % (02/09 0630) Weight:  [102.6 kg (226 lb 3.1 oz)] 102.6 kg (226 lb 3.1 oz) (02/09 0400) Weight change: -2.6 kg (-5 lb 11.7 oz) Last BM Date: 09/22/14  Intake/Output from previous day: 02/08 0701 - 02/09 0700 In: 2010.4 [P.O.:1537; I.V.:473.4] Out: 4925 [Urine:4925]  PHYSICAL EXAM General appearance: alert and cooperative Resp: clear to auscultation bilaterally Cardio: Mostly regular with occasional extra beat I don't hear a gallop GI: soft, non-tender; bowel sounds normal; no masses,  no organomegaly Extremities: Less edema. He has chronic venous stasis changes  Lab Results:  Results for orders placed or performed during the hospital encounter of 09/22/14 (from the past 48 hour(s))  Heparin level (unfractionated)     Status: None   Collection Time: 09/27/14  5:37 AM  Result Value Ref Range   Heparin Unfractionated 0.44 0.30 - 0.70 IU/mL    Comment:        IF HEPARIN RESULTS ARE BELOW EXPECTED VALUES, AND PATIENT DOSAGE HAS BEEN CONFIRMED, SUGGEST FOLLOW UP TESTING OF ANTITHROMBIN III LEVELS.   CBC     Status: Abnormal   Collection Time: 09/27/14  5:37 AM  Result Value Ref Range   WBC 7.3 4.0 - 10.5 K/uL   RBC 2.96 (L) 4.22 - 5.81 MIL/uL   Hemoglobin 8.2 (L) 13.0 - 17.0 g/dL   HCT 27.2 (L) 39.0 - 52.0 %   MCV 91.9 78.0 - 100.0 fL   MCH 27.7 26.0 - 34.0 pg   MCHC 30.1 30.0 - 36.0 g/dL   RDW 17.0 (H) 11.5 - 15.5 %   Platelets 154 150 - 400 K/uL  Basic metabolic panel     Status: Abnormal   Collection Time: 09/27/14  5:37 AM  Result Value Ref Range   Sodium 141 135 - 145 mmol/L   Potassium 3.9 3.5 - 5.1  mmol/L   Chloride 101 96 - 112 mmol/L   CO2 31 19 - 32 mmol/L   Glucose, Bld 134 (H) 70 - 99 mg/dL   BUN 87 (H) 6 - 23 mg/dL   Creatinine, Ser 2.95 (H) 0.50 - 1.35 mg/dL   Calcium 8.8 8.4 - 10.5 mg/dL   GFR calc non Af Amer 19 (L) >90 mL/min   GFR calc Af Amer 22 (L) >90 mL/min    Comment: (NOTE) The eGFR has been calculated using the CKD EPI equation. This calculation has not been validated in all clinical situations. eGFR's persistently <90 mL/min signify possible Chronic Kidney Disease.    Anion gap 9 5 - 15  Hepatic function panel     Status: Abnormal   Collection Time: 09/27/14  5:37 AM  Result Value Ref Range   Total Protein 5.8 (L) 6.0 - 8.3 g/dL   Albumin 2.8 (L) 3.5 - 5.2 g/dL   AST 70 (H) 0 - 37 U/L   ALT 199 (H) 0 - 53 U/L   Alkaline Phosphatase 55 39 - 117 U/L   Total Bilirubin 0.9 0.3 - 1.2 mg/dL   Bilirubin, Direct 0.2 0.0 - 0.5 mg/dL   Indirect Bilirubin 0.7 0.3 - 0.9 mg/dL  Heparin level (unfractionated)  Status: None   Collection Time: 09/28/14  4:52 AM  Result Value Ref Range   Heparin Unfractionated 0.53 0.30 - 0.70 IU/mL    Comment:        IF HEPARIN RESULTS ARE BELOW EXPECTED VALUES, AND PATIENT DOSAGE HAS BEEN CONFIRMED, SUGGEST FOLLOW UP TESTING OF ANTITHROMBIN III LEVELS.   CBC     Status: Abnormal   Collection Time: 09/28/14  4:52 AM  Result Value Ref Range   WBC 7.3 4.0 - 10.5 K/uL   RBC 3.08 (L) 4.22 - 5.81 MIL/uL   Hemoglobin 8.3 (L) 13.0 - 17.0 g/dL   HCT 28.1 (L) 39.0 - 52.0 %   MCV 91.2 78.0 - 100.0 fL   MCH 26.9 26.0 - 34.0 pg   MCHC 29.5 (L) 30.0 - 36.0 g/dL   RDW 17.2 (H) 11.5 - 15.5 %   Platelets 168 150 - 400 K/uL  Basic metabolic panel     Status: Abnormal   Collection Time: 09/28/14  4:52 AM  Result Value Ref Range   Sodium 137 135 - 145 mmol/L   Potassium 3.9 3.5 - 5.1 mmol/L   Chloride 100 96 - 112 mmol/L   CO2 32 19 - 32 mmol/L   Glucose, Bld 160 (H) 70 - 99 mg/dL   BUN 83 (H) 6 - 23 mg/dL   Creatinine, Ser 2.72  (H) 0.50 - 1.35 mg/dL   Calcium 8.3 (L) 8.4 - 10.5 mg/dL   GFR calc non Af Amer 21 (L) >90 mL/min   GFR calc Af Amer 24 (L) >90 mL/min    Comment: (NOTE) The eGFR has been calculated using the CKD EPI equation. This calculation has not been validated in all clinical situations. eGFR's persistently <90 mL/min signify possible Chronic Kidney Disease.    Anion gap 5 5 - 15    ABGS No results for input(s): PHART, PO2ART, TCO2, HCO3 in the last 72 hours.  Invalid input(s): PCO2 CULTURES Recent Results (from the past 240 hour(s))  MRSA PCR Screening     Status: None   Collection Time: 09/22/14  6:38 PM  Result Value Ref Range Status   MRSA by PCR NEGATIVE NEGATIVE Final    Comment:        The GeneXpert MRSA Assay (FDA approved for NASAL specimens only), is one component of a comprehensive MRSA colonization surveillance program. It is not intended to diagnose MRSA infection nor to guide or monitor treatment for MRSA infections.    Studies/Results: No results found.  Medications:  Prior to Admission:  Prescriptions prior to admission  Medication Sig Dispense Refill Last Dose  . atorvastatin (LIPITOR) 20 MG tablet Take 20 mg by mouth daily.   09/21/2014 at Unknown time  . levothyroxine (SYNTHROID, LEVOTHROID) 125 MCG tablet Take 125 mcg by mouth daily before breakfast.   09/21/2014 at Unknown time  . Multiple Vitamins-Minerals (CENTRUM SILVER PO) Take 1 tablet by mouth daily.   09/21/2014 at Unknown time  . aspirin EC 81 MG tablet Take 81 mg by mouth every other day.    09/20/2014  . carvedilol (COREG) 3.125 MG tablet Take 1 tablet (3.125 mg total) by mouth 2 (two) times daily with a meal. 60 tablet 12 Taking   Scheduled: . antiseptic oral rinse  7 mL Mouth Rinse BID  . atorvastatin  20 mg Oral q1800  . feeding supplement (ENSURE COMPLETE)  237 mL Oral BID BM  . furosemide  80 mg Intravenous BID  . levothyroxine  125 mcg Oral  QAC breakfast  . pantoprazole  40 mg Oral Daily    Continuous: . DOBUTamine 2.5 mcg/kg/min (09/27/14 1700)  . heparin 1,500 Units/hr (09/27/14 2206)   HWK:GSUPJS chloride, acetaminophen, ondansetron (ZOFRAN) IV, sodium chloride  Assesment: He was admitted with atrial fibrillation with rapid ventricular response and acute on chronic combined systolic and diastolic heart failure. He's had trouble with GI bleeding which seems to be resolved at this point. He has DVT in his arm and is on heparin for that. He is slowly improving. He is on dobutamine and IV Lasix and his heart failure is getting better. He has some element of failure to thrive and I think is probably going to require nursing home placement when he is discharged Principal Problem:   Acute combined systolic and diastolic congestive heart failure Active Problems:   Anemia of chronic disease   Chronic renal insufficiency, stage III (moderate)   Abnormal EKG- known IVCD (LBBB type)   Fall   Acute respiratory failure with hypoxia   Atrial fibrillation/ atrilal flutter- new   Elevated troponin   Acute on chronic renal insufficiency   Hyperkalemia   Elevated LFTs   Type 2 diabetes mellitus with renal manifestations   Cardiomyopathy-etiology undetermined- EF 40-45% by echo Jan 2015   Anemia due to GI blood loss   Protein-calorie malnutrition, severe    Plan: Continue current treatments.    LOS: 6 days   Aniket Paye L 09/28/2014, 9:02 AM

## 2014-09-28 NOTE — Progress Notes (Signed)
Greg [name]  MRN: [id number]  DOB/AGE: 03-20-1932 81 y.o.  Primary Care Physician:HAWKINS,EDWARD L, MD  Admit date: 09/22/2014  Chief Complaint:  Chief Complaint  Patient presents with  . Fall    S-Pt presented on  09/22/2014 with  Chief Complaint  Patient presents with  . Fall  .    Pt today feels better.    Pt offers no new complaints.  Meds . antiseptic oral rinse  7 mL Mouth Rinse BID  . atorvastatin  20 mg Oral q1800  . feeding supplement (ENSURE COMPLETE)  237 mL Oral BID BM  . furosemide  80 mg Intravenous BID  . levothyroxine  125 mcg Oral QAC breakfast  . pantoprazole  40 mg Oral Daily       Physical Exam: Vital signs in last 24 hours: Temp:  [97.6 F (36.4 C)-98.3 F (36.8 C)] 97.8 F (36.6 C) (02/09 0400) Pulse Rate:  [48-85] 71 (02/09 0630) Resp:  [14-33] 20 (02/09 0630) BP: (88-140)/(43-86) 113/48 mmHg (02/09 0630) SpO2:  [85 %-99 %] 96 % (02/09 0630) FiO2 (%):  [40 %] 40 % (02/08 0900) Weight:  [226 lb 3.1 oz (102.6 kg)] 226 lb 3.1 oz (102.6 kg) (02/09 0400) Weight change: -5 lb 11.7 oz (-2.6 kg) Last BM Date: 09/22/14  Intake/Output from previous day: 02/08 0701 - 02/09 0700 In: 2010.4 [P.O.:1537; I.V.:473.4] Out: 4098 [Urine:4925] Total I/O In: -  Out: 950 [Urine:950]   Physical Exam: General- pt is awake,alert, oriented to time place and person Resp- No acute REsp distress,  Decreased bs at bases. CVS- S1S2 regular in rate and rhythm GIT- BS+, soft, NT. EXT- 1+ LE Edema( much better), NO Cyanosis   Lab Results: CBC  Recent Labs  09/27/14 0537 09/28/14 0452  WBC 7.3 7.3  HGB 8.2* 8.3*  HCT 27.2* 28.1*  PLT 154 168    BMET  Recent Labs  09/27/14 0537 09/28/14 0452  NA 141 137  K 3.9 3.9  CL 101 100  CO2 31 32  GLUCOSE 134* 160*  BUN 87* 83*  CREATININE 2.95* 2.72*  CALCIUM 8.8 8.3*   Creat Trend 2016  3.3=>2.95=>2.72 2015  2.66=>1.79=>1.70 2012  1.77 2011  1.66--2.3 2009  1.6 2008   1.45  MICRO Recent  Results (from the past 240 hour(s))  MRSA PCR Screening     Status: None   Collection Time: 09/22/14  6:38 PM  Result Value Ref Range Status   MRSA by PCR NEGATIVE NEGATIVE Final    Comment:        The GeneXpert MRSA Assay (FDA approved for NASAL specimens only), is one component of a comprehensive MRSA colonization surveillance program. It is not intended to diagnose MRSA infection nor to guide or monitor treatment for MRSA infections.       Lab Results  Component Value Date   PTH 186.4* 09/04/2013   CALCIUM 8.3* 09/28/2014   PHOS 3.1 09/05/2013         Impression: 1)Renal  AKI secondary to Prerenal/ATN/Cardiorenal                AKI on CKD                AKI now better                Creat near to baseline               CKD stage 3.  CKD since 2008               CKD secondary to DM/ Age asso decline                Progression of CKD marked with multiple  AKI                Proteinura  Present-                    Less than 500mg    2)CVS on Dobutamine + Lasix                BP stable  3)Anemia HGb stable   4)CKD Mineral-Bone Disorder PTH elevated. Secondary Hyperparathyroidism  Present. Phosphorus at goal.   5)Arrhythmia -Atrial flutter  Primary MD and Cardiology following  6)Electrolytes Normokalemic NOrmonatremic   7)Acid base Co2 at goal  8) DVT on Heparin   Plan:  Will continue current care.     [place] S 09/28/2014, 8:57 AM

## 2014-09-28 NOTE — Progress Notes (Signed)
[place] for Heparin Indication: DVT  Allergies  Allergen Reactions  . Celery Oil     vomiting    Patient Measurements: Height: 5\' 11"  (180.3 cm) Weight: 226 lb 3.1 oz (102.6 kg) IBW/kg (Calculated) : 75.3 Heparin Dosing Weight: 100 kg  Vital Signs: Temp: 97.7 F (36.5 C) (02/09 0730) Temp Source: Oral (02/09 0730) BP: 113/48 mmHg (02/09 0630) Pulse Rate: 71 (02/09 0630)  Labs:  Recent Labs  09/26/14 0506 09/27/14 0537 09/28/14 0452  HGB 8.2* 8.2* 8.3*  HCT 28.2* 27.2* 28.1*  PLT 145* 154 168  HEPARINUNFRC 0.41 0.44 0.53  CREATININE 3.13* 2.95* 2.72*    Estimated Creatinine Clearance: 26.4 mL/min (by C-G formula based on Cr of 2.72).   Medical History: Past Medical History  Diagnosis Date  . Type 2 diabetes mellitus   . Anemia of chronic disease   . CKD (chronic kidney disease) stage 3, GFR 30-59 ml/min   . Herpes zoster   . Obesity   . Exposure to hazardous substance   . Intermittent Leukopenia   . Essential hypertension   . Sleep apnea     Sleep Apnea score of 7  . Cardiomyopathy     LVEF 40-45% January 2015  . Left bundle branch block     Medications:  Scheduled:  . antiseptic oral rinse  7 mL Mouth Rinse BID  . atorvastatin  20 mg Oral q1800  . feeding supplement (ENSURE COMPLETE)  237 mL Oral BID BM  . furosemide  80 mg Intravenous BID  . levothyroxine  125 mcg Oral QAC breakfast  . pantoprazole  40 mg Oral Daily    Assessment: [name] admitted with new onset Afib & transfusion dependent anemia, FOBT x3 as outpatient.  Anticoagulation was initially held pending GI work-up.  Venous doppler ultrasound now positive for right upper extremity axillary and brachial acute occlusive DVT and pharmacy asked to initiate heparin.  CBC has stabilized.  No overt bleeding noted.  Heparin level therapeutic.  Atrial flutter with controlled ventricular response. CHADSVASC score is 6. Suboptimal candidate for long-term  anticoagulation, but will continue to reconsider depending on clinical course and workup for possible GI bleed.  Goal of Therapy:  Heparin level 0.3-0.7 units/ml Monitor platelets by anticoagulation protocol: Yes   Plan:  Continue heparin infusion at 1500 units/hr Daily heparin level & CBC while on heparin F/U GI plans for colonoscopy/EGD F/U plan for long-term anticoagulation once ok with GI / cardiology  Hart Robinsons A 09/28/2014,10:42 AM

## 2014-09-28 NOTE — Care Management Utilization Note (Signed)
UR completed 

## 2014-09-29 LAB — CBC
HCT: 29 % — ABNORMAL LOW (ref 39.0–52.0)
Hemoglobin: 8.6 g/dL — ABNORMAL LOW (ref 13.0–17.0)
MCH: 27.4 pg (ref 26.0–34.0)
MCHC: 29.7 g/dL — ABNORMAL LOW (ref 30.0–36.0)
MCV: 92.4 fL (ref 78.0–100.0)
Platelets: 180 10*3/uL (ref 150–400)
RBC: 3.14 MIL/uL — ABNORMAL LOW (ref 4.22–5.81)
RDW: 17.7 % — ABNORMAL HIGH (ref 11.5–15.5)
WBC: 8.4 10*3/uL (ref 4.0–10.5)

## 2014-09-29 LAB — BASIC METABOLIC PANEL
Anion gap: 10 (ref 5–15)
BUN: 84 mg/dL — AB (ref 6–23)
CHLORIDE: 93 mmol/L — AB (ref 96–112)
CO2: 36 mmol/L — AB (ref 19–32)
Calcium: 8.8 mg/dL (ref 8.4–10.5)
Creatinine, Ser: 2.55 mg/dL — ABNORMAL HIGH (ref 0.50–1.35)
GFR calc Af Amer: 26 mL/min — ABNORMAL LOW (ref >90)
GFR calc non Af Amer: 22 mL/min — ABNORMAL LOW (ref >90)
Glucose, Bld: 137 mg/dL — ABNORMAL HIGH (ref 70–99)
Potassium: 3.9 mmol/L (ref 3.5–5.1)
Sodium: 139 mmol/L (ref 135–145)

## 2014-09-29 LAB — HEPARIN LEVEL (UNFRACTIONATED): Heparin Unfractionated: 0.43 IU/mL (ref 0.30–0.70)

## 2014-09-29 MED ORDER — POLYETHYLENE GLYCOL 3350 17 G PO PACK
17.0000 g | PACK | Freq: Every day | ORAL | Status: DC
  Administered 2014-09-29 – 2014-10-02 (×2): 17 g via ORAL
  Filled 2014-09-29 (×4): qty 1

## 2014-09-29 MED ORDER — FUROSEMIDE 10 MG/ML IJ SOLN
40.0000 mg | Freq: Two times a day (BID) | INTRAMUSCULAR | Status: DC
  Administered 2014-09-29: 40 mg via INTRAVENOUS
  Filled 2014-09-29: qty 4

## 2014-09-29 NOTE — Progress Notes (Signed)
Subjective: He continues to generally improve. He has no complaints. He ate better yesterday. He has not had a bowel movement in several days. He denies any pain. He says his breathing is doing well.  Objective: Vital signs in last 24 hours: Temp:  [97.5 F (36.4 C)-98.9 F (37.2 C)] 97.5 F (36.4 C) (02/10 0757) Pulse Rate:  [55-90] 69 (02/10 0800) Resp:  [15-28] 25 (02/10 0800) BP: (97-131)/(40-93) 120/49 mmHg (02/10 0800) SpO2:  [88 %-97 %] 94 % (02/10 0800) FiO2 (%):  [40 %] 40 % (02/09 1100) Weight:  [99.5 kg (219 lb 5.7 oz)] 99.5 kg (219 lb 5.7 oz) (02/10 0500) Weight change: -3.1 kg (-6 lb 13.4 oz) Last BM Date: 09/22/14  Intake/Output from previous day: 02/09 0701 - 02/10 0700 In: 1560.1 [P.O.:1130; I.V.:430.1] Out: 5150 [Urine:5150]  PHYSICAL EXAM General appearance: alert, cooperative, no distress and moderately obese Resp: clear to auscultation bilaterally Cardio: His heart is still somewhat irregular. I don't hear a gallop GI: soft, non-tender; bowel sounds normal; no masses,  no organomegaly Extremities: He continues to have decreasing edema of his legs but he does have some chronic edema even 2 or 3 years ago  Lab Results:  Results for orders placed or performed during the hospital encounter of 09/22/14 (from the past 48 hour(s))  Heparin level (unfractionated)     Status: None   Collection Time: 09/28/14  4:52 AM  Result Value Ref Range   Heparin Unfractionated 0.53 0.30 - 0.70 IU/mL    Comment:        IF HEPARIN RESULTS ARE BELOW EXPECTED VALUES, AND PATIENT DOSAGE HAS BEEN CONFIRMED, SUGGEST FOLLOW UP TESTING OF ANTITHROMBIN III LEVELS.   CBC     Status: Abnormal   Collection Time: 09/28/14  4:52 AM  Result Value Ref Range   WBC 7.3 4.0 - 10.5 K/uL   RBC 3.08 (L) 4.22 - 5.81 MIL/uL   Hemoglobin 8.3 (L) 13.0 - 17.0 g/dL   HCT 28.1 (L) 39.0 - 52.0 %   MCV 91.2 78.0 - 100.0 fL   MCH 26.9 26.0 - 34.0 pg   MCHC 29.5 (L) 30.0 - 36.0 g/dL   RDW 17.2 (H)  11.5 - 15.5 %   Platelets 168 150 - 400 K/uL  Basic metabolic panel     Status: Abnormal   Collection Time: 09/28/14  4:52 AM  Result Value Ref Range   Sodium 137 135 - 145 mmol/L   Potassium 3.9 3.5 - 5.1 mmol/L   Chloride 100 96 - 112 mmol/L   CO2 32 19 - 32 mmol/L   Glucose, Bld 160 (H) 70 - 99 mg/dL   BUN 83 (H) 6 - 23 mg/dL   Creatinine, Ser 2.72 (H) 0.50 - 1.35 mg/dL   Calcium 8.3 (L) 8.4 - 10.5 mg/dL   GFR calc non Af Amer 21 (L) >90 mL/min   GFR calc Af Amer 24 (L) >90 mL/min    Comment: (NOTE) The eGFR has been calculated using the CKD EPI equation. This calculation has not been validated in all clinical situations. eGFR's persistently <90 mL/min signify possible Chronic Kidney Disease.    Anion gap 5 5 - 15  Heparin level (unfractionated)     Status: None   Collection Time: 09/29/14  4:44 AM  Result Value Ref Range   Heparin Unfractionated 0.43 0.30 - 0.70 IU/mL    Comment:        IF HEPARIN RESULTS ARE BELOW EXPECTED VALUES, AND PATIENT DOSAGE HAS BEEN  CONFIRMED, SUGGEST FOLLOW UP TESTING OF ANTITHROMBIN III LEVELS.   CBC     Status: Abnormal   Collection Time: 09/29/14  4:44 AM  Result Value Ref Range   WBC 8.4 4.0 - 10.5 K/uL   RBC 3.14 (L) 4.22 - 5.81 MIL/uL   Hemoglobin 8.6 (L) 13.0 - 17.0 g/dL   HCT 29.0 (L) 39.0 - 52.0 %   MCV 92.4 78.0 - 100.0 fL   MCH 27.4 26.0 - 34.0 pg   MCHC 29.7 (L) 30.0 - 36.0 g/dL   RDW 17.7 (H) 11.5 - 15.5 %   Platelets 180 150 - 400 K/uL  Basic metabolic panel     Status: Abnormal   Collection Time: 09/29/14  4:44 AM  Result Value Ref Range   Sodium 139 135 - 145 mmol/L   Potassium 3.9 3.5 - 5.1 mmol/L   Chloride 93 (L) 96 - 112 mmol/L   CO2 36 (H) 19 - 32 mmol/L   Glucose, Bld 137 (H) 70 - 99 mg/dL   BUN 84 (H) 6 - 23 mg/dL   Creatinine, Ser 2.55 (H) 0.50 - 1.35 mg/dL   Calcium 8.8 8.4 - 10.5 mg/dL   GFR calc non Af Amer 22 (L) >90 mL/min   GFR calc Af Amer 26 (L) >90 mL/min    Comment: (NOTE) The eGFR has been  calculated using the CKD EPI equation. This calculation has not been validated in all clinical situations. eGFR's persistently <90 mL/min signify possible Chronic Kidney Disease.    Anion gap 10 5 - 15    ABGS No results for input(s): PHART, PO2ART, TCO2, HCO3 in the last 72 hours.  Invalid input(s): PCO2 CULTURES Recent Results (from the past 240 hour(s))  MRSA PCR Screening     Status: None   Collection Time: 09/22/14  6:38 PM  Result Value Ref Range Status   MRSA by PCR NEGATIVE NEGATIVE Final    Comment:        The GeneXpert MRSA Assay (FDA approved for NASAL specimens only), is one component of a comprehensive MRSA colonization surveillance program. It is not intended to diagnose MRSA infection nor to guide or monitor treatment for MRSA infections.    Studies/Results: No results found.  Medications:  Prior to Admission:  Prescriptions prior to admission  Medication Sig Dispense Refill Last Dose  . atorvastatin (LIPITOR) 20 MG tablet Take 20 mg by mouth daily.   09/21/2014 at Unknown time  . levothyroxine (SYNTHROID, LEVOTHROID) 125 MCG tablet Take 125 mcg by mouth daily before breakfast.   09/21/2014 at Unknown time  . Multiple Vitamins-Minerals (CENTRUM SILVER PO) Take 1 tablet by mouth daily.   09/21/2014 at Unknown time  . aspirin EC 81 MG tablet Take 81 mg by mouth every other day.    09/20/2014  . carvedilol (COREG) 3.125 MG tablet Take 1 tablet (3.125 mg total) by mouth 2 (two) times daily with a meal. 60 tablet 12 Taking   Scheduled: . antiseptic oral rinse  7 mL Mouth Rinse BID  . atorvastatin  20 mg Oral q1800  . feeding supplement (ENSURE COMPLETE)  237 mL Oral BID BM  . furosemide  80 mg Intravenous BID  . levothyroxine  125 mcg Oral QAC breakfast  . pantoprazole  40 mg Oral Daily  . polyethylene glycol  17 g Oral Daily   Continuous: . DOBUTamine 2.5 mcg/kg/min (09/28/14 1700)  . heparin 1,500 Units/hr (09/28/14 1903)   MDY:[id number] chloride,  acetaminophen, ondansetron (ZOFRAN) IV, sodium chloride  Assesment: He was admitted with a fall and was found to have new onset atrial flutter/fibrillation. He also had acute on chronic renal insufficiency. His cardiac function has decreased substantially in the last year. His ejection fraction was 40-45% in January 2015 and down now to 20-25% by echocardiogram. He has been anemic and had GI bleeding with 3 heme positive stools in my office. He has positive troponin level felt to be demand ischemia. He has protein calorie malnutrition. He is overall improving. He has had substantial diuresis now has much less edema he is more awake and eating better. His renal function is improving Principal Problem:   Acute combined systolic and diastolic congestive heart failure Active Problems:   Anemia of chronic disease   Chronic renal insufficiency, stage III (moderate)   Abnormal EKG- known IVCD (LBBB type)   Fall   Acute respiratory failure with hypoxia   Atrial fibrillation/ atrilal flutter- new   Elevated troponin   Acute on chronic renal insufficiency   Hyperkalemia   Elevated LFTs   Type 2 diabetes mellitus with renal manifestations   Cardiomyopathy-etiology undetermined- EF 40-45% by echo Jan 2015   Anemia due to GI blood loss   Protein-calorie malnutrition, severe    Plan: I will start him on Miralax. He will continue his other treatments. I discussed the situation with Dr. Domenic Polite and plans are for him to remain on dobutamine at least for another 24-48 hours because he has had such a substantial improvement with it. I will go ahead and ask physical therapy to see him but I know they can do to much work with him now.    LOS: 7 days   Rolonda Pontarelli L 09/29/2014, 8:15 AM

## 2014-09-29 NOTE — Clinical Social Work Psychosocial (Signed)
Clinical Social Work Department BRIEF PSYCHOSOCIAL ASSESSMENT 09/29/2014  Patient:  Greg [name], Greg [name]     Account Number:  [id number]     Admit date:  09/22/2014  Clinical Social Worker:  Greg [name]  Date/Time:  09/29/2014 04:55 PM  Referred by:  CSW  Date Referred:  09/29/2014 Referred for  SNF Placement   Other Referral:   Interview type:  Family Other interview type:   wife- [place]    PSYCHOSOCIAL DATA Living Status:  FAMILY Admitted from facility:   Level of care:   Primary support name:  Greg [name] Primary support relationship to patient:  SPOUSE Degree of support available:   supportive    CURRENT CONCERNS Current Concerns  Post-Acute Placement   Other Concerns:    SOCIAL WORK ASSESSMENT / PLAN CSW met with pt and pt's wife at bedside. Pt was resting at time of CSW and requested for assessment to be completed with his wife. She reports that their daughter lives with them, but is at work during the day. Pt got up to go to the bathroom at home and fell. EMS was called and pt admitted with acute respiratory failure secondary to CHF. Pt has been in hospital for a week and is very deconditioned. At baseline, pt is completely independent. His wife reports that last year he spent 2 weeks in the hospital with pneumonia, but he was able to return home with home health. She feels he is much weaker now and agrees with PT recommendation for SNF. Pt's wife feels pt will adjust well to this plan and will be very motivated to work with PT in order to return home as quickly as possible. CSW discussed placement process and provided SNF list. She requests either [facility] or [facility]. Aware of Medicare coverage/criteria. CSW will initiate bed search and follow up.   Assessment/plan status:  Psychosocial Support/Ongoing Assessment of Needs Other assessment/ plan:   Information/referral to community resources:   SNF list    PATIENT'S/FAMILY'S RESPONSE TO PLAN OF CARE: Pt deferred assessment to  his wife who agrees pt will need SNF prior to returning home.       Greg [name], [place]

## 2014-09-29 NOTE — Progress Notes (Signed)
Primary cardiologist: Dr. Carlyle Dolly  Seen for followup: Atrial arrhythmia, acute on chronic systolic heart failure  Subjective:    Generally more energy, leg swelling and abdominal swelling much better. Appetite is fair.  Objective:   Temp:  [97.5 F (36.4 C)-98.9 F (37.2 C)] 97.5 F (36.4 C) (02/10 0757) Pulse Rate:  [55-90] 70 (02/10 0730) Resp:  [15-28] 18 (02/10 0730) BP: (97-130)/(40-93) 124/53 mmHg (02/10 0730) SpO2:  [88 %-97 %] 96 % (02/10 0730) FiO2 (%):  [40 %] 40 % (02/09 1100) Weight:  [219 lb 5.7 oz (99.5 kg)] 219 lb 5.7 oz (99.5 kg) (02/10 0500) Last BM Date: 09/22/14  Filed Weights   09/27/14 0500 09/28/14 0400 09/29/14 0500  Weight: 231 lb 14.8 oz (105.2 kg) 226 lb 3.1 oz (102.6 kg) 219 lb 5.7 oz (99.5 kg)    Intake/Output Summary (Last 24 hours) at 09/29/14 0806 Last data filed at 09/29/14 0600  Gross per 24 hour  Intake 1560.05 ml  Output   4200 ml  Net -2639.95 ml    Telemetry: Rate controlled atrial flutter with 3:1 block.  Exam:  General: Overweight, chronically ill-appearing, no distress.  Lungs: Diminished breath sounds at the bases.  Cardiac: Regular rate and rhythm, no S3 gallop.  Abdomen: Obese, nontender.  Extremities: Leg edema continues to improve.  Lab Results:  Basic Metabolic Panel:  Recent Labs Lab 09/24/14 0454  09/25/14 0446  09/27/14 0537 09/28/14 0452 09/29/14 0444  NA  --   < >  --   < > 141 137 139  K  --   < >  --   < > 3.9 3.9 3.9  CL  --   < >  --   < > 101 100 93*  CO2  --   < >  --   < > 31 32 36*  GLUCOSE  --   < >  --   < > 134* 160* 137*  BUN  --   < >  --   < > 87* 83* 84*  CREATININE  --   < >  --   < > 2.95* 2.72* 2.55*  CALCIUM  --   < >  --   < > 8.8 8.3* 8.8  MG 2.2  --  2.1  --   --   --   --   < > = values in this interval not displayed.  Liver Function Tests:  Recent Labs Lab 09/24/14 0500 09/25/14 0452 09/27/14 0537  AST 259* 146* 70*  ALT 360* 302* 199*  ALKPHOS 64 62  55  BILITOT 0.5 0.7 0.9  PROT 5.6* 6.0 5.8*  ALBUMIN 2.9* 3.0* 2.8*    CBC:  Recent Labs Lab 09/27/14 0537 09/28/14 0452 09/29/14 0444  WBC 7.3 7.3 8.4  HGB 8.2* 8.3* 8.6*  HCT 27.2* 28.1* 29.0*  MCV 91.9 91.2 92.4  PLT 154 168 180    Cardiac Enzymes:  Recent Labs Lab 09/22/14 1554 09/22/14 2149 09/23/14 0403  TROPONINI 1.13* 1.33* 1.32*    Coagulation:  Recent Labs Lab 09/23/14 1957  INR 1.71*    ECG: Atrial flutter with 3:1 block and left bundle branch block.   Medications:   Scheduled Medications: . antiseptic oral rinse  7 mL Mouth Rinse BID  . atorvastatin  20 mg Oral q1800  . feeding supplement (ENSURE COMPLETE)  237 mL Oral BID BM  . furosemide  80 mg Intravenous BID  . levothyroxine  125 mcg Oral QAC  breakfast  . pantoprazole  40 mg Oral Daily  . polyethylene glycol  17 g Oral Daily    Infusions: . DOBUTamine 2.5 mcg/kg/min (09/28/14 1700)  . heparin 1,500 Units/hr (09/28/14 1903)    PRN Medications: sodium chloride, acetaminophen, ondansetron (ZOFRAN) IV, sodium chloride   Assessment:   1. Acute on chronic systolic heart failure, LVEF now in the range of 20-25% by follow-up echocardiogram. Started on low-dose dobutamine by Dr. Bronson Ing on February 5. He continues to have excellent diuresis with improving renal function and overall clinical status.  2. Atrial flutter with controlled ventricular response. CHADSVASC score is 6. Suboptimal candidate for long-term anticoagulation, but will continue to reconsider depending on clinical course and workup for possible GI bleed.  3. Right upper extremity DVT, currently on IV heparin.  4. Acute on chronic renal failure, creatinine down to 2.5 at this point. Nephrology following.  5. Anemia with history of GI bleed. Gastroenterology evaluating, patient for possible EGD and colonoscopy.   Plan/Discussion:    No change to current diuretic regimen, continue dobutamine. We may end up  transitioning back to oral regimen within the next 24-48 hours, but since he continues to diuresis so well with improving renal function we will continue current course. GI following, may consider EGD/colonoscopy when patient closer to discharge. This will help guide decision about anticoagulation.  Satira Sark, M.D., F.A.C.C.

## 2014-09-29 NOTE — Progress Notes (Signed)
Greg [name] for Heparin Indication: DVT  Allergies  Allergen Reactions  . Celery Oil     vomiting   Patient Measurements: Height: 5\' 11"  (180.3 cm) Weight: 219 lb 5.7 oz (99.5 kg) IBW/kg (Calculated) : 75.3 Heparin Dosing Weight: 100 kg  Vital Signs: Temp: 97.5 F (36.4 C) (02/10 0757) Temp Source: Oral (02/10 0757) BP: 104/44 mmHg (02/10 1000) Pulse Rate: 75 (02/10 1000)  Labs:  Recent Labs  09/27/14 0537 09/28/14 0452 09/29/14 0444  HGB 8.2* 8.3* 8.6*  HCT 27.2* 28.1* 29.0*  PLT 154 168 180  HEPARINUNFRC 0.44 0.53 0.43  CREATININE 2.95* 2.72* 2.55*    Estimated Creatinine Clearance: 27.8 mL/min (by C-G formula based on Cr of 2.55).  Medical History: Past Medical History  Diagnosis Date  . Type 2 diabetes mellitus   . Anemia of chronic disease   . CKD (chronic kidney disease) stage 3, GFR 30-59 ml/min   . Herpes zoster   . Obesity   . Exposure to hazardous substance   . Intermittent Leukopenia   . Essential hypertension   . Sleep apnea     Sleep Apnea score of 7  . Cardiomyopathy     LVEF 40-45% January 2015  . Left bundle branch block     Medications:  Scheduled:  . antiseptic oral rinse  7 mL Mouth Rinse BID  . atorvastatin  20 mg Oral q1800  . feeding supplement (ENSURE COMPLETE)  237 mL Oral BID BM  . furosemide  80 mg Intravenous BID  . levothyroxine  125 mcg Oral QAC breakfast  . pantoprazole  40 mg Oral Daily  . polyethylene glycol  17 g Oral Daily    Assessment: [name] admitted with new onset Afib & transfusion dependent anemia, FOBT x3 as outpatient.  Anticoagulation was initially held pending GI work-up.  Venous doppler ultrasound now positive for right upper extremity axillary and brachial acute occlusive DVT and pharmacy asked to initiate heparin.  CBC has stabilized.  No overt bleeding noted.  Heparin level therapeutic.  Per cardiology: Atrial flutter with controlled ventricular response.  CHADSVASC score is 6. Suboptimal candidate for long-term anticoagulation, but will continue to reconsider depending on clinical course and workup for possible GI bleed.  Goal of Therapy:  Heparin level 0.3-0.7 units/ml Monitor platelets by anticoagulation protocol: Yes   Plan:  Continue heparin infusion at 1500 units/hr Daily heparin level & CBC while on heparin F/U GI plans for colonoscopy/EGD F/U plan for long-term anticoagulation once ok with GI / cardiology  Hart Robinsons A 09/29/2014,10:40 AM

## 2014-09-29 NOTE — Clinical Social Work Placement (Signed)
Clinical Social Work Department CLINICAL SOCIAL WORK PLACEMENT NOTE 09/29/2014  Patient:  SAYRE, WITHERINGTON  Account Number:  [id number] Admit date:  09/22/2014  Clinical Social Worker:  [name], LCSW  Date/time:  09/29/2014 04:51 PM  Clinical Social Work is seeking post-discharge placement for this patient at the following level of care:   [place]   (*CSW will update this form in Epic as items are completed)   09/29/2014  Patient/family provided with [place] Department of Clinical Social Work's list of facilities offering this level of care within the geographic area requested by the patient (or if unable, by the patient's family).  09/29/2014  Patient/family informed of their freedom to choose among providers that offer the needed level of care, that participate in Medicare, Medicaid or managed care program needed by the patient, have an available bed and are willing to accept the patient.  09/29/2014  Patient/family informed of [facility]' ownership interest in [facility], as well as of the fact that they are under no obligation to receive care at this facility.  PASARR submitted to EDS on 09/29/2014 PASARR number received on 09/29/2014  FL2 transmitted to all facilities in geographic area requested by pt/family on  09/29/2014 FL2 transmitted to all facilities within larger geographic area on   Patient informed that his/her managed care company has contracts with or will negotiate with  certain facilities, including the following:     Patient/family informed of bed offers received:   Patient chooses bed at  Physician recommends and patient chooses bed at    Patient to be transferred to  on   Patient to be transferred to facility by  Patient and family notified of transfer on  Name of family member notified:    The following physician request were entered in Epic:   Additional Comments:  [name], [place]

## 2014-09-29 NOTE — Progress Notes (Signed)
DEMBA NIGH  MRN: [id number]  DOB/AGE: 1932-03-12 81 y.o.  Primary Care Physician:HAWKINS,EDWARD L, MD  Admit date: 09/22/2014  Chief Complaint:  Chief Complaint  Patient presents with  . Fall    S-Pt presented on  09/22/2014 with  Chief Complaint  Patient presents with  . Fall  .    Pt today feels better.    Pt offers no new complaints.  Meds . antiseptic oral rinse  7 mL Mouth Rinse BID  . atorvastatin  20 mg Oral q1800  . feeding supplement (ENSURE COMPLETE)  237 mL Oral BID BM  . furosemide  80 mg Intravenous BID  . levothyroxine  125 mcg Oral QAC breakfast  . pantoprazole  40 mg Oral Daily  . polyethylene glycol  17 g Oral Daily       Physical Exam: Vital signs in last 24 hours: Temp:  [97.5 F (36.4 C)-98.9 F (37.2 C)] 97.5 F (36.4 C) (02/10 0757) Pulse Rate:  [55-90] 69 (02/10 0900) Resp:  [15-28] 24 (02/10 0900) BP: (97-136)/(40-93) 136/48 mmHg (02/10 0900) SpO2:  [88 %-97 %] 92 % (02/10 0900) FiO2 (%):  [40 %] 40 % (02/09 1100) Weight:  [219 lb 5.7 oz (99.5 kg)] 219 lb 5.7 oz (99.5 kg) (02/10 0500) Weight change: -6 lb 13.4 oz (-3.1 kg) Last BM Date: 09/22/14  Intake/Output from previous day: 02/09 0701 - 02/10 0700 In: 1560.1 [P.O.:1130; I.V.:430.1] Out: 5150 [Urine:5150] Total I/O In: -  Out: 850 [Urine:850]   Physical Exam: General- pt is awake,alert, oriented to time place and person Resp- No acute REsp distress,  Decreased bs at bases. CVS- S1S2 regular in rate and rhythm GIT- BS+, soft, NT. EXT- 1+ LE Edema( much better), NO Cyanosis   Lab Results: CBC  Recent Labs  09/28/14 0452 09/29/14 0444  WBC 7.3 8.4  HGB 8.3* 8.6*  HCT 28.1* 29.0*  PLT 168 180    BMET  Recent Labs  09/28/14 0452 09/29/14 0444  NA 137 139  K 3.9 3.9  CL 100 93*  CO2 32 36*  GLUCOSE 160* 137*  BUN 83* 84*  CREATININE 2.72* 2.55*  CALCIUM 8.3* 8.8   Creat Trend 2016  3.3=>2.95=>2.72=>2.55 2015  2.66=>1.79=>1.70 2012  1.77 2011   1.66--2.3 2009  1.6 2008   1.45  MICRO Recent Results (from the past 240 hour(s))  MRSA PCR Screening     Status: None   Collection Time: 09/22/14  6:38 PM  Result Value Ref Range Status   MRSA by PCR NEGATIVE NEGATIVE Final    Comment:        The GeneXpert MRSA Assay (FDA approved for NASAL specimens only), is one component of a comprehensive MRSA colonization surveillance program. It is not intended to diagnose MRSA infection nor to guide or monitor treatment for MRSA infections.       Lab Results  Component Value Date   PTH 186.4* 09/04/2013   CALCIUM 8.8 09/29/2014   PHOS 3.1 09/05/2013         Impression: 1)Renal  AKI secondary to Prerenal/ATN/Cardiorenal                AKI on CKD                AKI now better                Creat near to baseline               CKD stage 3.  CKD since 2008               CKD secondary to DM/ Age asso decline                Progression of CKD marked with multiple  AKI                Proteinura  Present-                    Less than 500mg    2)CHF on Dobutamine + Lasix              BP stable             Pt negative by 17 liters             Bicarb trending up             Will reduce lasix dose  3)Anemia HGb stable   4)CKD Mineral-Bone Disorder PTH elevated. Secondary Hyperparathyroidism  Present. Phosphorus at goal.   5)Arrhythmia -Atrial flutter  Primary MD and Cardiology following  6)Electrolytes Normokalemic NOrmonatremic   7)Acid base Co2 at goal  8) DVT on Heparin   Plan:  Will reduce lasix dose Will follow bmet.   Nana Vastine S 09/29/2014, 9:16 AM

## 2014-09-29 NOTE — Evaluation (Signed)
Physical Therapy Evaluation Patient Details Name: Greg [name] MRN: [id number] DOB: 04/10/1932 Today's Date: 09/29/2014   History of Present Illness  81 year old man with history of transfusion dependent anemia of unclear etiology, combined systolic/diastolic dysfunction, diabetes mellitus presented to the emergency department after a fall at home with difficulty getting up afterwards. Also noted 2 week history of shortness of breath. Initial evaluation revealed acute hypoxic respiratory failure, elevated troponin, new diagnosis atrial fibrillation, acute on chronic renal failure, hyperkalemia and he was referred for admission.   Patient reports no known history of heart disease or long-term edema. The last 2 weeks he has had increasing lower extremity edema bilaterally with increased shortness of breath, worst in the morning. No associated chest pain. Over the last 3-5 days he has become much more short of breath. He has been able to recline and sleep flat. He notes difficulty walking, fatigue, coordination problems for the last week. Today he got up to go the bathroom when he lost his balance and fell. He had his head but did not pass out or lose consciousness.  He has a history of anemia of unclear etiology and received 2 transfusions in January. Outpatient evaluation with gastroenterology was planned for today.  Clinical Impression  Pt is an 81 year old male who presents to PT with dx of acute combined systolic and diastolic congestive heart failure. Wife present for evaluation.  Per pt/family, pt has not been able to get out of bed for the past 6 days since admission secondary to medical status.  Recent dx of Rt UE DVT, though no complaints of pain today.  MD orders for evaluation, with therapeutic bed exercises and increasing tolerance as able.  Pt able to complete therapeutic UE and LE exercises in bed with rest breaks between activity.  Pt required mod assist to transfers to EOB, and 1 UE assist  and CGA to maintain balance at EOB.  Pt able to sit at EOB ~4 minutes prior to returning to supine.  Recommend continued PT while in the hospital to address strengthening, balance, and activity tolerance for improving functional mobility skills with transition to SNF at discharge.  Will defer to SNF for DME recommendations.     Follow Up Recommendations SNF    Equipment Recommendations  None recommended by PT    Recommendations for Other Services OT consult     Precautions / Restrictions Precautions Precautions: Fall Precaution Comments: Recent dx of Rt UE DVT Restrictions Weight Bearing Restrictions: No      Mobility  Bed Mobility Overal bed mobility: Needs Assistance Bed Mobility: Supine to Sit;Sit to Supine     Supine to sit: Mod assist;HOB elevated (Mod assist for trunk) Sit to supine: Mod assist (Mod assist for trunk and LE)      Transfers                 General transfer comment: Not attempted secondary to fatigue with bed mobility.      Balance Overall balance assessment: Needs assistance Sitting-balance support: Feet unsupported;Single extremity supported Sitting balance-Leahy Scale: Fair   Postural control: Right lateral lean                                   Pertinent Vitals/Pain Pain Assessment: No/denies pain    Home Living Family/patient expects to be discharged to:: Skilled nursing facility Living Arrangements: Spouse/significant other;Children (Lives with wife, daughter) Available Help at Discharge:  Family;Available 24 hours/day Type of Home: House Home Access: Stairs to enter Entrance Stairs-Rails: None Entrance Stairs-Number of Steps: 1 Home Layout: Multi-level (Basement, only wife uses) Home Equipment: Latina Craver - 2 wheels;Bedside commode;Wheelchair - manual      Prior Function Level of Independence: Independent               Hand Dominance   Dominant Hand: Right    Extremity/Trunk Assessment                Lower Extremity Assessment: Generalized weakness         Communication   Communication: No difficulties  Cognition Arousal/Alertness: Awake/alert Behavior During Therapy: WFL for tasks assessed/performed Overall Cognitive Status: Within Functional Limits for tasks assessed                      General Comments      Exercises General Exercises - Upper Extremity Shoulder Flexion: AROM;Both;10 reps;Seated Shoulder ABduction: AROM;Both;5 reps;Seated Elbow Flexion: AROM;Both;5 reps;Seated (resisted by PT) Elbow Extension: AROM;Both;5 reps;Seated (resisted by PT) General Exercises - Lower Extremity Ankle Circles/Pumps: AROM;Both;10 reps;Supine Long Arc Quad: AROM;Both;5 reps;Seated (Unable to fully extend Rt or Lt LE secondary to weakness) Heel Slides: AAROM;Both;10 reps;Supine      Assessment/Plan    PT Assessment Patient needs continued PT services  PT Diagnosis Generalized weakness;Difficulty walking   PT Problem List Decreased strength;Decreased activity tolerance;Decreased balance;Decreased mobility  PT Treatment Interventions Gait training;Balance training;Functional mobility training;Therapeutic activities;Therapeutic exercise;Manual techniques;Patient/family education   PT Goals (Current goals can be found in the Care Plan section) Acute Rehab PT Goals Patient Stated Goal: be (I) again PT Goal Formulation: With patient/family Time For Goal Achievement: 10/13/14 Potential to Achieve Goals: Good    Frequency Min 3X/week       End of Session   Activity Tolerance: Patient limited by fatigue Patient left: in bed;with call bell/phone within reach;with family/visitor present Nurse Communication: Mobility status         Time: 4536-4680 PT Time Calculation (min) (ACUTE ONLY): 26 min   Charges:   PT Evaluation $Initial PT Evaluation Tier I: 1 Procedure PT Treatments $Therapeutic Exercise: 8-22 mins   Lonna Cobb,  DPT [phone]  09/29/2014, 12:30 PM

## 2014-09-30 LAB — BASIC METABOLIC PANEL
Anion gap: 10 (ref 5–15)
BUN: 88 mg/dL — ABNORMAL HIGH (ref 6–23)
CHLORIDE: 92 mmol/L — AB (ref 96–112)
CO2: 35 mmol/L — ABNORMAL HIGH (ref 19–32)
Calcium: 8.4 mg/dL (ref 8.4–10.5)
Creatinine, Ser: 2.64 mg/dL — ABNORMAL HIGH (ref 0.50–1.35)
GFR calc Af Amer: 25 mL/min — ABNORMAL LOW (ref >90)
GFR calc non Af Amer: 21 mL/min — ABNORMAL LOW (ref >90)
GLUCOSE: 146 mg/dL — AB (ref 70–99)
POTASSIUM: 4 mmol/L (ref 3.5–5.1)
SODIUM: 137 mmol/L (ref 135–145)

## 2014-09-30 LAB — CBC
HEMATOCRIT: 29.5 % — AB (ref 39.0–52.0)
Hemoglobin: 8.7 g/dL — ABNORMAL LOW (ref 13.0–17.0)
MCH: 27.5 pg (ref 26.0–34.0)
MCHC: 29.5 g/dL — ABNORMAL LOW (ref 30.0–36.0)
MCV: 93.4 fL (ref 78.0–100.0)
Platelets: 193 10*3/uL (ref 150–400)
RBC: 3.16 MIL/uL — ABNORMAL LOW (ref 4.22–5.81)
RDW: 18.2 % — AB (ref 11.5–15.5)
WBC: 8.1 10*3/uL (ref 4.0–10.5)

## 2014-09-30 LAB — CLOSTRIDIUM DIFFICILE BY PCR: Toxigenic C. Difficile by PCR: NEGATIVE

## 2014-09-30 LAB — HEPARIN LEVEL (UNFRACTIONATED): Heparin Unfractionated: 0.39 IU/mL (ref 0.30–0.70)

## 2014-09-30 MED ORDER — SALINE SPRAY 0.65 % NA SOLN: 1.0000 | NASAL | Status: DC | PRN

## 2014-09-30 MED ORDER — FUROSEMIDE 40 MG PO TABS
40.0000 mg | ORAL_TABLET | Freq: Every day | ORAL | Status: DC
Start: 2014-09-30 — End: 2014-10-01

## 2014-09-30 MED ORDER — FUROSEMIDE 10 MG/ML IJ SOLN
40.0000 mg | Freq: Every day | INTRAMUSCULAR | Status: DC
  Administered 2014-09-30: 40 mg via INTRAVENOUS
  Filled 2014-09-30: qty 4

## 2014-09-30 NOTE — Progress Notes (Signed)
PT Cancellation Note  Patient Details Name: Greg [name] MRN: [id number] DOB: [birth year]   Cancelled Treatment:    Reason Eval/Treat Not Completed: Patient's level of consciousness Attempted PT treatment, though pt unable to be aroused to participate.  Will re-attempt PT treatment as time allows.    Lonna Cobb, DPT [phone]  09/30/2014, 3:45 PM

## 2014-09-30 NOTE — Progress Notes (Signed)
Consulting cardiologist: Greg Lesches MD Primary Cardiologist: Greg Dolly MD  Cardiology Specific Problem List: 1. Acute on Chronic Systolic CHF 2. Atrial flutter  Subjective:    Eating breakfast. No chest pain or palpitations. Fair appetite.  Objective:   Temp:  [97.4 F (36.3 C)-98 F (36.7 C)] 97.4 F (36.3 C) (02/11 0758) Pulse Rate:  [28-103] 70 (02/11 0815) Resp:  [15-32] 19 (02/11 0815) BP: (93-143)/(30-118) 118/50 mmHg (02/11 0815) SpO2:  [92 %-100 %] 100 % (02/11 0815) Weight:  [217 lb 9.5 oz (98.7 kg)] 217 lb 9.5 oz (98.7 kg) (02/11 0500) Last BM Date: 09/22/14  Filed Weights   09/28/14 0400 09/29/14 0500 09/30/14 0500  Weight: 226 lb 3.1 oz (102.6 kg) 219 lb 5.7 oz (99.5 kg) 217 lb 9.5 oz (98.7 kg)    Intake/Output Summary (Last 24 hours) at 09/30/14 0840 Last data filed at 09/30/14 0818  Gross per 24 hour  Intake  604.8 ml  Output   2775 ml  Net -2170.2 ml    Telemetry: Atrial flutter rates in the 70's to 80's.   Exam:  General: No acute distress.  Lungs: Clear to auscultation, nonlabored. Diminished in the bases.   Cardiac: No elevated JVP or bruits. IRRR 1/6 systolic murmur,  Abdomen: Active bowel sounds, nontender, nondistended.  Extremities: Much improved edema.   Lab Results:  Basic Metabolic Panel:  Recent Labs Lab 09/24/14 0454  09/25/14 0446  09/28/14 0452 09/29/14 0444 09/30/14 0438  NA  --   < >  --   < > 137 139 137  K  --   < >  --   < > 3.9 3.9 4.0  CL  --   < >  --   < > 100 93* 92*  CO2  --   < >  --   < > 32 36* 35*  GLUCOSE  --   < >  --   < > 160* 137* 146*  BUN  --   < >  --   < > 83* 84* 88*  CREATININE  --   < >  --   < > 2.72* 2.55* 2.64*  CALCIUM  --   < >  --   < > 8.3* 8.8 8.4  MG 2.2  --  2.1  --   --   --   --   < > = values in this interval not displayed.  Liver Function Tests:  Recent Labs Lab 09/24/14 0500 09/25/14 0452 09/27/14 0537  AST 259* 146* 70*  ALT 360* 302* 199*    ALKPHOS 64 62 55  BILITOT 0.5 0.7 0.9  PROT 5.6* 6.0 5.8*  ALBUMIN 2.9* 3.0* 2.8*    CBC:  Recent Labs Lab 09/28/14 0452 09/29/14 0444 09/30/14 0438  WBC 7.3 8.4 8.1  HGB 8.3* 8.6* 8.7*  HCT 28.1* 29.0* 29.5*  MCV 91.2 92.4 93.4  PLT 168 180 193    Coagulation:  Recent Labs Lab 09/23/14 1957  INR 1.71*     Medications:   Scheduled Medications: . antiseptic oral rinse  7 mL Mouth Rinse BID  . atorvastatin  20 mg Oral q1800  . feeding supplement (ENSURE COMPLETE)  237 mL Oral BID BM  . furosemide  40 mg Intravenous Daily  . levothyroxine  125 mcg Oral QAC breakfast  . pantoprazole  40 mg Oral Daily  . polyethylene glycol  17 g Oral Daily    Infusions: . DOBUTamine 2.5 mcg/kg/min (09/30/14 0818)  .  heparin 1,500 Units/hr (09/30/14 0818)    PRN Medications: sodium chloride, acetaminophen, ondansetron (ZOFRAN) IV, sodium chloride, sodium chloride   Assessment and Plan:   1. Acute on Chronic Systolic CHF:  Excellent diureses with total of 18.9 liters of output.  Dobutamine continues at 2.5 mcgs. Lasix at 40 mg IV BID. Creatinine beginning to rise slightly. From 2.55 to 2.64 this am. CO2-35. Diureses is slowing a little from 3.5 liters to 2.5 liters overnight.  2. Atrial Flutter: Heart rate is controlled. Continues on heparin. No AV nodal blocking agents on board at this time. CHADS VASC score of 6. Suboptimal candidate for long-term anticoagulation, but will continue to reconsider depending on clinical course and workup for possible GI bleed.  3. RUE DVT: Continues on heparin. He is anemic and having GI evaluate for EDG and Colonoscopy due to anemia. Will await recommendations from them before beginning anticoagulation.   4. Incontinent stool: Check for C-Diff.  [name]. Lawrence NP [place]  09/30/2014, 8:40 AM    Attending note:  Patient seen and examined. Agree with above assessment by Ms. Lawrence NP. Mr. Howk has made significant clinical improvement  with substantial diuresis. Lungs are clear with decreased breath sounds, abdomen soft and nontender, legs show substantial improvement in edema, area on right anterior lower leg is dressed. Creatinine has increased from 2.5 to 2.6. Today we will plan to stop dobutamine after current bag has completed and try and transition to oral Lasix. Follow-up CMET in a.m. Hold off on adding back Coreg for now.  Satira Sark, M.D., F.A.C.C.

## 2014-09-30 NOTE — Progress Notes (Signed)
FOLLOW UP NUTRITION ASSESSMENT  DOCUMENTATION CODES Per approved criteria  -Severe malnutrition in the context of chronic illness   INTERVENTION: 1. Continue Provide Ensure BID to supplement oral intake  NUTRITION DIAGNOSIS: Inadequate oral intake related to reduced appetite as evidenced by loss of 27 pounds in 1 month and meeting an estimated 50% of energy needs for past few months, ongoing  Goal: Pt to meet >/= 90% of their estimated nutrition needs, unmet but improving  Monitor:  Oral intake, weight/fluid status, labs   ASSESSMENT: Interval hx: Main issue is CHF. Pt now 30 pounds lighter as much fluid has been taken off. Changed energy needs to reflect this. Eating 50% of meals.   Spoke with patient and wife. Pt ate almost all of breakfast this morning and reports having a much better appetite. He also has been drinking both ensure supplements daily. Pt also had a BM today after not having one for 5 days  Height: Ht Readings from Last 1 Encounters:  09/22/14 5\' 11"  (1.803 m)    Weight: Wt Readings from Last 1 Encounters:  09/30/14 217 lb 9.5 oz (98.7 kg)    Ideal Body Weight: 172 lbs  % Ideal Body Weight: 143%  Wt Readings from Last 10 Encounters:  09/30/14 217 lb 9.5 oz (98.7 kg)  10/02/13 247 lb 6.4 oz (112.22 kg)  09/15/13 273 lb 9.5 oz (124.1 kg)  09/07/13 273 lb 14.4 oz (124.24 kg)  08/28/10 273 lb 12.8 oz (124.195 kg)  08/28/10 273 lb 12.8 oz (124.195 kg)    Usual Body Weight: Reported to be 288  % Usual Body Weight: 75%   BMI:  Body mass index is 30.36 kg/(m^2).  Estimated Nutritional Needs: Kcal: 1724 Protein: 156 Fluid: per md  Skin: Weeping/ecchymosis, skin tears/wounds  Diet Order: DIET DYS 3  EDUCATION NEEDS: -No education needs identified at this time   Intake/Output Summary (Last 24 hours) at 09/30/14 0849 Last data filed at 09/30/14 0818  Gross per 24 hour  Intake  604.8 ml  Output   2775 ml  Net -2170.2 ml  Has been eating  ~50% of meals  Last BM: 2/11  Labs:   Recent Labs Lab 09/24/14 0454  09/25/14 0446  09/28/14 0452 09/29/14 0444 09/30/14 0438  NA  --   < >  --   < > 137 139 137  K  --   < >  --   < > 3.9 3.9 4.0  CL  --   < >  --   < > 100 93* 92*  CO2  --   < >  --   < > 32 36* 35*  BUN  --   < >  --   < > 83* 84* 88*  CREATININE  --   < >  --   < > 2.72* 2.55* 2.64*  CALCIUM  --   < >  --   < > 8.3* 8.8 8.4  MG 2.2  --  2.1  --   --   --   --   GLUCOSE  --   < >  --   < > 160* 137* 146*  < > = values in this interval not displayed.  CBG (last 3)  No results for input(s): GLUCAP in the last 72 hours.  Scheduled Meds: . antiseptic oral rinse  7 mL Mouth Rinse BID  . atorvastatin  20 mg Oral q1800  . feeding supplement (ENSURE COMPLETE)  237 mL Oral BID  BM  . furosemide  40 mg Intravenous Daily  . levothyroxine  125 mcg Oral QAC breakfast  . pantoprazole  40 mg Oral Daily  . polyethylene glycol  17 g Oral Daily    Continuous Infusions: . DOBUTamine 2.5 mcg/kg/min (09/30/14 0818)  . heparin 1,500 Units/hr (09/30/14 0818)    Past Medical History  Diagnosis Date  . Type 2 diabetes mellitus   . Anemia of chronic disease   . CKD (chronic kidney disease) stage 3, GFR 30-59 ml/min   . Herpes zoster   . Obesity   . Exposure to hazardous substance   . Intermittent Leukopenia   . Essential hypertension   . Sleep apnea     Sleep Apnea score of 7  . Cardiomyopathy     LVEF 40-45% January 2015  . Left bundle branch block     Past Surgical History  Procedure Laterality Date  . Nasal polyp surgery    . Ventral hernia repair N/A 09/14/2013    Procedure: HERNIA REPAIR VENTRAL ADULT WITH MESH;  Surgeon: Jamesetta So, MD;  Location: [facility];  Service: General;  Laterality: N/A;  . Insertion of mesh N/A 09/14/2013    Procedure: INSERTION OF MESH;  Surgeon: Jamesetta So, MD;  Location: [facility];  Service: General;  Laterality: N/A;    Burtis Junes RD,  LDN Nutrition [phone] 09/30/2014 8:49 AM

## 2014-09-30 NOTE — Progress Notes (Signed)
Subjective: He says he feels okay. He ate a little bit better yesterday. He had a bowel movement today.  Objective: Vital signs in last 24 hours: Temp:  [97.4 F (36.3 C)-98 F (36.7 C)] 97.4 F (36.3 C) (02/11 0758) Pulse Rate:  [28-103] 70 (02/11 0815) Resp:  [15-32] 19 (02/11 0815) BP: (93-143)/(30-118) 118/50 mmHg (02/11 0815) SpO2:  [91 %-100 %] 100 % (02/11 0815) Weight:  [98.7 kg (217 lb 9.5 oz)] 98.7 kg (217 lb 9.5 oz) (02/11 0500) Weight change: -0.8 kg (-1 lb 12.2 oz) Last BM Date: 09/22/14  Intake/Output from previous day: 02/10 0701 - 02/11 0700 In: 604.8 [P.O.:240; I.V.:364.8] Out: 3125 [Urine:3125]  PHYSICAL EXAM General appearance: alert, cooperative and no distress Resp: clear to auscultation bilaterally Cardio: His heart rate is controlled and he is somewhat more irregular today GI: soft, non-tender; bowel sounds normal; no masses,  no organomegaly Extremities: extremities normal, atraumatic, no cyanosis or edema  Lab Results:  Results for orders placed or performed during the hospital encounter of 09/22/14 (from the past 48 hour(s))  Heparin level (unfractionated)     Status: None   Collection Time: 09/29/14  4:44 AM  Result Value Ref Range   Heparin Unfractionated 0.43 0.30 - 0.70 IU/mL    Comment:        IF HEPARIN RESULTS ARE BELOW EXPECTED VALUES, AND PATIENT DOSAGE HAS BEEN CONFIRMED, SUGGEST FOLLOW UP TESTING OF ANTITHROMBIN III LEVELS.   CBC     Status: Abnormal   Collection Time: 09/29/14  4:44 AM  Result Value Ref Range   WBC 8.4 4.0 - 10.5 K/uL   RBC 3.14 (L) 4.22 - 5.81 MIL/uL   Hemoglobin 8.6 (L) 13.0 - 17.0 g/dL   HCT 29.0 (L) 39.0 - 52.0 %   MCV 92.4 78.0 - 100.0 fL   MCH 27.4 26.0 - 34.0 pg   MCHC 29.7 (L) 30.0 - 36.0 g/dL   RDW 17.7 (H) 11.5 - 15.5 %   Platelets 180 150 - 400 K/uL  Basic metabolic panel     Status: Abnormal   Collection Time: 09/29/14  4:44 AM  Result Value Ref Range   Sodium 139 135 - 145 mmol/L   Potassium  3.9 3.5 - 5.1 mmol/L   Chloride 93 (L) 96 - 112 mmol/L   CO2 36 (H) 19 - 32 mmol/L   Glucose, Bld 137 (H) 70 - 99 mg/dL   BUN 84 (H) 6 - 23 mg/dL   Creatinine, Ser 2.55 (H) 0.50 - 1.35 mg/dL   Calcium 8.8 8.4 - 10.5 mg/dL   GFR calc non Af Amer 22 (L) >90 mL/min   GFR calc Af Amer 26 (L) >90 mL/min    Comment: (NOTE) The eGFR has been calculated using the CKD EPI equation. This calculation has not been validated in all clinical situations. eGFR's persistently <90 mL/min signify possible Chronic Kidney Disease.    Anion gap 10 5 - 15  Heparin level (unfractionated)     Status: None   Collection Time: 09/30/14  4:38 AM  Result Value Ref Range   Heparin Unfractionated 0.39 0.30 - 0.70 IU/mL    Comment:        IF HEPARIN RESULTS ARE BELOW EXPECTED VALUES, AND PATIENT DOSAGE HAS BEEN CONFIRMED, SUGGEST FOLLOW UP TESTING OF ANTITHROMBIN III LEVELS.   CBC     Status: Abnormal   Collection Time: 09/30/14  4:38 AM  Result Value Ref Range   WBC 8.1 4.0 - 10.5 K/uL  RBC 3.16 (L) 4.22 - 5.81 MIL/uL   Hemoglobin 8.7 (L) 13.0 - 17.0 g/dL   HCT 29.5 (L) 39.0 - 52.0 %   MCV 93.4 78.0 - 100.0 fL   MCH 27.5 26.0 - 34.0 pg   MCHC 29.5 (L) 30.0 - 36.0 g/dL   RDW 18.2 (H) 11.5 - 15.5 %   Platelets 193 150 - 400 K/uL  Basic metabolic panel     Status: Abnormal   Collection Time: 09/30/14  4:38 AM  Result Value Ref Range   Sodium 137 135 - 145 mmol/L   Potassium 4.0 3.5 - 5.1 mmol/L   Chloride 92 (L) 96 - 112 mmol/L   CO2 35 (H) 19 - 32 mmol/L   Glucose, Bld 146 (H) 70 - 99 mg/dL   BUN 88 (H) 6 - 23 mg/dL   Creatinine, Ser 2.64 (H) 0.50 - 1.35 mg/dL   Calcium 8.4 8.4 - 10.5 mg/dL   GFR calc non Af Amer 21 (L) >90 mL/min   GFR calc Af Amer 25 (L) >90 mL/min    Comment: (NOTE) The eGFR has been calculated using the CKD EPI equation. This calculation has not been validated in all clinical situations. eGFR's persistently <90 mL/min signify possible Chronic Kidney Disease.    Anion gap  10 5 - 15    ABGS No results for input(s): PHART, PO2ART, TCO2, HCO3 in the last 72 hours.  Invalid input(s): PCO2 CULTURES Recent Results (from the past 240 hour(s))  MRSA PCR Screening     Status: None   Collection Time: 09/22/14  6:38 PM  Result Value Ref Range Status   MRSA by PCR NEGATIVE NEGATIVE Final    Comment:        The GeneXpert MRSA Assay (FDA approved for NASAL specimens only), is one component of a comprehensive MRSA colonization surveillance program. It is not intended to diagnose MRSA infection nor to guide or monitor treatment for MRSA infections.    Studies/Results: No results found.  Medications:  Prior to Admission:  Prescriptions prior to admission  Medication Sig Dispense Refill Last Dose  . atorvastatin (LIPITOR) 20 MG tablet Take 20 mg by mouth daily.   09/21/2014 at Unknown time  . levothyroxine (SYNTHROID, LEVOTHROID) 125 MCG tablet Take 125 mcg by mouth daily before breakfast.   09/21/2014 at Unknown time  . Multiple Vitamins-Minerals (CENTRUM SILVER PO) Take 1 tablet by mouth daily.   09/21/2014 at Unknown time  . aspirin EC 81 MG tablet Take 81 mg by mouth every other day.    09/20/2014  . carvedilol (COREG) 3.125 MG tablet Take 1 tablet (3.125 mg total) by mouth 2 (two) times daily with a meal. 60 tablet 12 Taking   Scheduled: . antiseptic oral rinse  7 mL Mouth Rinse BID  . atorvastatin  20 mg Oral q1800  . feeding supplement (ENSURE COMPLETE)  237 mL Oral BID BM  . furosemide  40 mg Intravenous Daily  . levothyroxine  125 mcg Oral QAC breakfast  . pantoprazole  40 mg Oral Daily  . polyethylene glycol  17 g Oral Daily   Continuous: . DOBUTamine 2.5 mcg/kg/min (09/30/14 0818)  . heparin 1,500 Units/hr (09/30/14 0818)   DZH:GDJMEQ chloride, acetaminophen, ondansetron (ZOFRAN) IV, sodium chloride, sodium chloride  Assesment: He was admitted with a fall was found to be in atrial fibrillation with rapid ventricular response. He was also noted to  have acute on chronic renal insufficiency. He has acute combined systolic and diastolic heart failure  with ejection fraction that is much worse than about a year ago. He is on dobutamine and has improved significantly.  His renal function is improving but slightly worse today.  He had elevated LFTs which are better and probably related to passive congestion.  He has anemia both of chronic disease and of GI blood loss and his hemoglobin is stable. He has not had any bleeding in the hospital.  He has protein calorie malnutrition and seems to be eating a little bit better.  He has acute hypoxic respiratory failure and he is still on oxygen Principal Problem:   Acute combined systolic and diastolic congestive heart failure Active Problems:   Anemia of chronic disease   Chronic renal insufficiency, stage III (moderate)   Abnormal EKG- known IVCD (LBBB type)   Fall   Acute respiratory failure with hypoxia   Atrial fibrillation/ atrilal flutter- new   Elevated troponin   Acute on chronic renal insufficiency   Hyperkalemia   Elevated LFTs   Type 2 diabetes mellitus with renal manifestations   Cardiomyopathy-etiology undetermined- EF 40-45% by echo Jan 2015   Anemia due to GI blood loss   Protein-calorie malnutrition, severe    Plan: Continue current treatments. He will need skilled care facility when he is ready for discharge   LOS: 8 days   Vadhir Mcnay L 09/30/2014, 8:29 AM

## 2014-09-30 NOTE — Progress Notes (Signed)
Subjective: Interval History: Patient presently denies any difficulty breathing. His appetite is still good and no nausea or vomiting.  Objective: Vital signs in last 24 hours: Temp:  [97.4 F (36.3 C)-98 F (36.7 C)] 98 F (36.7 C) (02/11 0400) Pulse Rate:  [28-103] 50 (02/11 0700) Resp:  [15-32] 17 (02/11 0700) BP: (96-143)/(30-118) 96/50 mmHg (02/11 0700) SpO2:  [91 %-99 %] 98 % (02/11 0700) Weight:  [98.7 kg (217 lb 9.5 oz)] 98.7 kg (217 lb 9.5 oz) (02/11 0500) Weight change: -0.8 kg (-1 lb 12.2 oz)  Intake/Output from previous day: 02/10 0701 - 02/11 0700 In: 604.8 [P.O.:240; I.V.:364.8] Out: 3125 [Urine:3125] Intake/Output this shift:    Generally: Patient is alert and eating his breakfast. Chest decreased breath sound bilaterally  Heart exam revealed iregular rate and rhythm no murmur Extremities: He has trace to 1+ edema  Lab Results:  Recent Labs  09/29/14 0444 09/30/14 0438  WBC 8.4 8.1  HGB 8.6* 8.7*  HCT 29.0* 29.5*  PLT 180 193   BMET:   Recent Labs  09/29/14 0444 09/30/14 0438  NA 139 137  K 3.9 4.0  CL 93* 92*  CO2 36* 35*  GLUCOSE 137* 146*  BUN 84* 88*  CREATININE 2.55* 2.64*  CALCIUM 8.8 8.4   No results for input(s): PTH in the last 72 hours. Iron Studies:  No results for input(s): IRON, TIBC, TRANSFERRIN, FERRITIN in the last 72 hours.  Studies/Results: No results found.  I have reviewed the patient's current medications.  Assessment/Plan: Problem #1 CHF. Presently he is on Lasix and dobutamine. He had 3100 mL of urine output. Patient seems to be improving. Problem #2 acute on chronic renal failure. Presently his BUN and creatinine show some increase. This could be because of aggressive fluid removal. problem #3 hyperkalemia his potassium has corrected Problem #4 anemia: Patient with history of GI bleeding. His hemoglobin is low but stable. Problem #5 type 2 diabetes Problem #6 elevated troponin: Patient presently denies any  chest pain. Problem #7 atrial fibrillation: His heart rate is fluctuating but presently seems to have controlled. Problem #8 elevated LFTs: Presently improving.  Problem #9  DVT: Patient on heparin. Plan: We'll decrease Lasix to 40 mg once a day. We'll check his basic metabolic panel in the morning.     LOS: 8 days   Seanmichael Salmons S 09/30/2014,7:37 AM

## 2014-09-30 NOTE — Progress Notes (Signed)
[place] for Heparin Indication: DVT  Allergies  Allergen Reactions  . Celery Oil     vomiting   Patient Measurements: Height: 5\' 11"  (180.3 cm) Weight: 217 lb 9.5 oz (98.7 kg) IBW/kg (Calculated) : 75.3 Heparin Dosing Weight: 100 kg  Vital Signs: Temp: 97.4 F (36.3 C) (02/11 0758) Temp Source: Oral (02/11 0758) BP: 112/45 mmHg (02/11 0915) Pulse Rate: 59 (02/11 0915)  Labs:  Recent Labs  09/28/14 0452 09/29/14 0444 09/30/14 0438  HGB 8.3* 8.6* 8.7*  HCT 28.1* 29.0* 29.5*  PLT 168 180 193  HEPARINUNFRC 0.53 0.43 0.39  CREATININE 2.72* 2.55* 2.64*   Estimated Creatinine Clearance: 26.7 mL/min (by C-G formula based on Cr of 2.64).  Medical History: Past Medical History  Diagnosis Date  . Type 2 diabetes mellitus   . Anemia of chronic disease   . CKD (chronic kidney disease) stage 3, GFR 30-59 ml/min   . Herpes zoster   . Obesity   . Exposure to hazardous substance   . Intermittent Leukopenia   . Essential hypertension   . Sleep apnea     Sleep Apnea score of 7  . Cardiomyopathy     LVEF 40-45% January 2015  . Left bundle branch block    Medications:  Scheduled:  . antiseptic oral rinse  7 mL Mouth Rinse BID  . atorvastatin  20 mg Oral q1800  . feeding supplement (ENSURE COMPLETE)  237 mL Oral BID BM  . furosemide  40 mg Oral Daily  . levothyroxine  125 mcg Oral QAC breakfast  . pantoprazole  40 mg Oral Daily  . polyethylene glycol  17 g Oral Daily   Assessment: [name] admitted with new onset Afib & transfusion dependent anemia, FOBT x3 as outpatient.  Anticoagulation was initially held pending GI work-up.  Venous doppler ultrasound now positive for right upper extremity axillary and brachial acute occlusive DVT and pharmacy asked to initiate heparin.  CBC has stabilized.  No overt bleeding noted.  H/H stable. Heparin level therapeutic.  Awaiting GI recommendations.  Per cardiology: Atrial flutter with  controlled ventricular response. CHADSVASC score is 6. Suboptimal candidate for long-term anticoagulation, but will continue to reconsider depending on clinical course and workup for possible GI bleed.  Goal of Therapy:  Heparin level 0.3-0.7 units/ml Monitor platelets by anticoagulation protocol: Yes   Plan:  Continue heparin infusion at 1500 units/hr Daily heparin level & CBC while on heparin F/U GI plans and recommendations F/U plan for long-term anticoagulation once ok with GI / cardiology  Hart Robinsons A 09/30/2014,10:27 AM

## 2014-10-01 ENCOUNTER — Ambulatory Visit: Payer: Medicare Other | Admitting: Nurse Practitioner

## 2014-10-01 LAB — BASIC METABOLIC PANEL
Anion gap: 11 (ref 5–15)
BUN: 89 mg/dL — AB (ref 6–23)
CHLORIDE: 90 mmol/L — AB (ref 96–112)
CO2: 37 mmol/L — ABNORMAL HIGH (ref 19–32)
CREATININE: 2.5 mg/dL — AB (ref 0.50–1.35)
Calcium: 8.7 mg/dL (ref 8.4–10.5)
GFR calc Af Amer: 26 mL/min — ABNORMAL LOW (ref >90)
GFR calc non Af Amer: 23 mL/min — ABNORMAL LOW (ref >90)
Glucose, Bld: 133 mg/dL — ABNORMAL HIGH (ref 70–99)
Potassium: 4.1 mmol/L (ref 3.5–5.1)
SODIUM: 138 mmol/L (ref 135–145)

## 2014-10-01 LAB — CBC WITH DIFFERENTIAL/PLATELET
Basophils Absolute: 0 10*3/uL (ref 0.0–0.1)
Basophils Relative: 0 % (ref 0–1)
EOS PCT: 2 % (ref 0–5)
Eosinophils Absolute: 0.2 10*3/uL (ref 0.0–0.7)
HEMATOCRIT: 28.8 % — AB (ref 39.0–52.0)
HEMOGLOBIN: 8.3 g/dL — AB (ref 13.0–17.0)
LYMPHS ABS: 1.5 10*3/uL (ref 0.7–4.0)
LYMPHS PCT: 19 % (ref 12–46)
MCH: 27.2 pg (ref 26.0–34.0)
MCHC: 28.8 g/dL — AB (ref 30.0–36.0)
MCV: 94.4 fL (ref 78.0–100.0)
MONO ABS: 0.8 10*3/uL (ref 0.1–1.0)
MONOS PCT: 10 % (ref 3–12)
NEUTROS ABS: 5.4 10*3/uL (ref 1.7–7.7)
Neutrophils Relative %: 69 % (ref 43–77)
Platelets: 196 10*3/uL (ref 150–400)
RBC: 3.05 MIL/uL — ABNORMAL LOW (ref 4.22–5.81)
RDW: 18.6 % — ABNORMAL HIGH (ref 11.5–15.5)
WBC: 7.9 10*3/uL (ref 4.0–10.5)

## 2014-10-01 LAB — HEPARIN LEVEL (UNFRACTIONATED): Heparin Unfractionated: 0.47 IU/mL (ref 0.30–0.70)

## 2014-10-01 MED ORDER — CARVEDILOL 3.125 MG PO TABS
3.1250 mg | ORAL_TABLET | Freq: Two times a day (BID) | ORAL | Status: DC
  Administered 2014-10-01 – 2014-10-08 (×14): 3.125 mg via ORAL
  Filled 2014-10-01 (×18): qty 1

## 2014-10-01 MED ORDER — TORSEMIDE 20 MG PO TABS
60.0000 mg | ORAL_TABLET | Freq: Every day | ORAL | Status: DC
  Administered 2014-10-01 – 2014-10-03 (×3): 60 mg via ORAL
  Filled 2014-10-01 (×3): qty 3

## 2014-10-01 NOTE — Plan of Care (Signed)
Problem: Phase I Progression Outcomes Goal: Voiding-avoid urinary catheter unless indicated Outcome: Progressing Condom catheter in use

## 2014-10-01 NOTE — Progress Notes (Signed)
Physical Therapy Treatment Patient Details Name: Greg [name] MRN: [id number] DOB: 1931-10-31 Today's Date: 10/01/2014    History of Present Illness 81 year old man with history of transfusion dependent anemia of unclear etiology, combined systolic/diastolic dysfunction, diabetes mellitus presented to the emergency department after a fall at home with difficulty getting up afterwards. Also noted 2 week history of shortness of breath. Initial evaluation revealed acute hypoxic respiratory failure, elevated troponin, new diagnosis atrial fibrillation, acute on chronic renal failure, hyperkalemia and he was referred for admission.   Patient reports no known history of heart disease or long-term edema. The last 2 weeks he has had increasing lower extremity edema bilaterally with increased shortness of breath, worst in the morning. No associated chest pain. Over the last 3-5 days he has become much more short of breath. He has been able to recline and sleep flat. He notes difficulty walking, fatigue, coordination problems for the last week. Today he got up to go the bathroom when he lost his balance and fell. He had his head but did not pass out or lose consciousness.  He has a history of anemia of unclear etiology and received 2 transfusions in January. Outpatient evaluation with gastroenterology was planned for today.    PT Comments    Mr. Greg [name] was seen today awake, alert, cooperative, oriented and able to follow directions. Current functional status: Bed mobility has improved from Moderate Assist to Min Guard/contact guard assist for Supine to Sit and to [place] for Sit to Supine. Transfers and Radiographer, therapeutic Assist with verbal clues. Patient was able to walk 30 feet with difficulty in gait. Activity is Limited by Fatigue. Tolerated all therapeutic exercises and activities with no complaints of increase pain, LOB, and signs of intolerance.    Follow Up Recommendations  SNF     Equipment  Recommendations  None recommended by PT    Recommendations for Other Services none    Precautions / Restrictions Precautions Precautions: Fall Precaution Comments: Recent dx of Rt UE DVT Restrictions Weight Bearing Restrictions: No    Mobility  Bed Mobility Overal bed mobility: Needs Assistance Bed Mobility: Supine to Sit;Sit to Supine  Supine to sit: Min assist;HOB elevated Sit to supine: Mod assist   Transfers Overall transfer level: Needs assistance Equipment used: Rolling walker (2 wheeled) Transfers: Sit to/from Stand Sit to Stand: Min assist   Ambulation/Gait Ambulation/Gait assistance: Min guard Ambulation Distance (Feet): 30 Feet Assistive device: Rolling walker (2 wheeled) Gait Pattern/deviations: Wide base of support;Trunk flexed;Staggering right   Gait velocity interpretation: Below normal speed for age/gender    Balance Overall balance assessment: Needs assistance Sitting-balance support: Feet unsupported Sitting balance-Leahy Scale: Good  Standing balance support: Bilateral upper extremity supported Standing balance-Leahy Scale: Poor     Cognition Arousal/Alertness: Awake/alert Behavior During Therapy: WFL for tasks assessed/performed Overall Cognitive Status: Within Functional Limits for tasks assessed     Exercises Total Joint Exercises Ankle Circles/Pumps: AROM;20 reps;Supine;Both Quad Sets: AROM;Both;10 reps;Supine Heel Slides: AAROM;Both;10 reps;Supine Hip ABduction/ADduction: AROM;Both;10 reps;Supine        Pertinent Vitals/Pain Pain Assessment: No/denies pain     PT Goals (current goals can now be found in the care plan section) Progress towards PT goals: Progressing toward goals    Frequency  Min 3X/week    PT Plan Current plan remains appropriate       End of Session Equipment Utilized During Treatment: Gait belt;Oxygen Activity Tolerance: Patient limited by fatigue;Patient tolerated treatment well Patient left: in bed;with  call bell/phone within reach;with family/visitor present     Time: 0830-0900 PT Time Calculation (min) (ACUTE ONLY): 30 min  Charges:  $Gait Training: 8-22 mins $Therapeutic Exercise: 8-22 mins                        Gaylin Bulthuis A 10/01/2014, 9:19 AM

## 2014-10-01 NOTE — Progress Notes (Signed)
[place] for Heparin Indication: DVT  Allergies  Allergen Reactions  . Celery Oil     vomiting   Patient Measurements: Height: 5\' 11"  (180.3 cm) Weight: 214 lb 4.6 oz (97.2 kg) IBW/kg (Calculated) : 75.3 Heparin Dosing Weight: 100 kg  Vital Signs: Temp: 97.4 F (36.3 C) (02/12 0700) Temp Source: Oral (02/12 0400) BP: 118/47 mmHg (02/12 0700) Pulse Rate: 66 (02/12 0700)  Labs:  Recent Labs  09/29/14 0444 09/30/14 0438 10/01/14 0413  HGB 8.6* 8.7* 8.3*  HCT 29.0* 29.5* 28.8*  PLT 180 193 196  HEPARINUNFRC 0.43 0.39 0.47  CREATININE 2.55* 2.64* 2.50*   Estimated Creatinine Clearance: 28 mL/min (by C-G formula based on Cr of 2.5).  Medical History: Past Medical History  Diagnosis Date  . Type 2 diabetes mellitus   . Anemia of chronic disease   . CKD (chronic kidney disease) stage 3, GFR 30-59 ml/min   . Herpes zoster   . Obesity   . Exposure to hazardous substance   . Intermittent Leukopenia   . Essential hypertension   . Sleep apnea     Sleep Apnea score of 7  . Cardiomyopathy     LVEF 40-45% January 2015  . Left bundle branch block    Medications:  Scheduled:  . antiseptic oral rinse  7 mL Mouth Rinse BID  . atorvastatin  20 mg Oral q1800  . carvedilol  3.125 mg Oral BID WC  . feeding supplement (ENSURE COMPLETE)  237 mL Oral BID BM  . levothyroxine  125 mcg Oral QAC breakfast  . pantoprazole  40 mg Oral Daily  . polyethylene glycol  17 g Oral Daily  . torsemide  60 mg Oral Daily   Assessment: [name] admitted with new onset Afib & transfusion dependent anemia, FOBT x3 as outpatient.  Anticoagulation was initially held pending GI work-up.  Venous doppler ultrasound now positive for right upper extremity axillary and brachial acute occlusive DVT and pharmacy asked to initiate heparin.  CBC has stabilized.  No overt bleeding noted.  H/H stable. Heparin level therapeutic.  Awaiting GI recommendations.  Per  cardiology: Atrial flutter with controlled ventricular response. CHADSVASC score is 6. Suboptimal candidate for long-term anticoagulation, but will continue to reconsider depending on clinical course and workup for possible GI bleed.  Goal of Therapy:  Heparin level 0.3-0.7 units/ml Monitor platelets by anticoagulation protocol: Yes   Plan:  Continue heparin infusion at 1500 units/hr Daily heparin level & CBC while on heparin F/U GI plans and recommendations F/U plan for long-term anticoagulation once ok with GI / cardiology  Hart Robinsons A 10/01/2014,9:30 AM

## 2014-10-01 NOTE — Clinical Social Work Note (Signed)
CSW discussed SNF bed offers and pt and wife choose [facility]. Facility notified. Pt will remain in hospital over weekend per MD.   [name], [place]

## 2014-10-01 NOTE — Progress Notes (Signed)
Subjective: He says he feels okay. He has no new complaints. His breathing is doing okay. He is eating a little bit better.  Objective: Vital signs in last 24 hours: Temp:  [97.4 F (36.3 C)-98.4 F (36.9 C)] 97.5 F (36.4 C) (02/12 0400) Pulse Rate:  [38-140] 70 (02/12 0500) Resp:  [14-25] 16 (02/12 0500) BP: (96-138)/(35-71) 100/49 mmHg (02/12 0500) SpO2:  [94 %-100 %] 99 % (02/12 0500) Weight:  [97.2 kg (214 lb 4.6 oz)] 97.2 kg (214 lb 4.6 oz) (02/12 0500) Weight change: -1.5 kg (-3 lb 4.9 oz) Last BM Date: 09/30/14 (C-Diff negative)  Intake/Output from previous day: 02/11 0701 - 02/12 0700 In: 499.2 [I.V.:499.2] Out: 1700 [Urine:1700]  PHYSICAL EXAM General appearance: alert, cooperative and no distress Resp: clear to auscultation bilaterally Cardio: sstill somewhat irregular without gallop GI: soft, non-tender; bowel sounds normal; no masses,  no organomegaly Extremities: extremities normal, atraumatic, no cyanosis or edema  Lab Results:  Results for orders placed or performed during the hospital encounter of 09/22/14 (from the past 48 hour(s))  Heparin level (unfractionated)     Status: None   Collection Time: 09/30/14  4:38 AM  Result Value Ref Range   Heparin Unfractionated 0.39 0.30 - 0.70 IU/mL    Comment:        IF HEPARIN RESULTS ARE BELOW EXPECTED VALUES, AND PATIENT DOSAGE HAS BEEN CONFIRMED, SUGGEST FOLLOW UP TESTING OF ANTITHROMBIN III LEVELS.   CBC     Status: Abnormal   Collection Time: 09/30/14  4:38 AM  Result Value Ref Range   WBC 8.1 4.0 - 10.5 K/uL   RBC 3.16 (L) 4.22 - 5.81 MIL/uL   Hemoglobin 8.7 (L) 13.0 - 17.0 g/dL   HCT 29.5 (L) 39.0 - 52.0 %   MCV 93.4 78.0 - 100.0 fL   MCH 27.5 26.0 - 34.0 pg   MCHC 29.5 (L) 30.0 - 36.0 g/dL   RDW 18.2 (H) 11.5 - 15.5 %   Platelets 193 150 - 400 K/uL  Basic metabolic panel     Status: Abnormal   Collection Time: 09/30/14  4:38 AM  Result Value Ref Range   Sodium 137 135 - 145 mmol/L   Potassium  4.0 3.5 - 5.1 mmol/L   Chloride 92 (L) 96 - 112 mmol/L   CO2 35 (H) 19 - 32 mmol/L   Glucose, Bld 146 (H) 70 - 99 mg/dL   BUN 88 (H) 6 - 23 mg/dL   Creatinine, Ser 2.64 (H) 0.50 - 1.35 mg/dL   Calcium 8.4 8.4 - 10.5 mg/dL   GFR calc non Af Amer 21 (L) >90 mL/min   GFR calc Af Amer 25 (L) >90 mL/min    Comment: (NOTE) The eGFR has been calculated using the CKD EPI equation. This calculation has not been validated in all clinical situations. eGFR's persistently <90 mL/min signify possible Chronic Kidney Disease.    Anion gap 10 5 - 15  Clostridium Difficile by PCR     Status: None   Collection Time: 09/30/14  1:55 PM  Result Value Ref Range   C difficile by pcr NEGATIVE NEGATIVE  Heparin level (unfractionated)     Status: None   Collection Time: 10/01/14  4:13 AM  Result Value Ref Range   Heparin Unfractionated 0.47 0.30 - 0.70 IU/mL    Comment:        IF HEPARIN RESULTS ARE BELOW EXPECTED VALUES, AND PATIENT DOSAGE HAS BEEN CONFIRMED, SUGGEST FOLLOW UP TESTING OF ANTITHROMBIN III LEVELS.  CBC with Differential/Platelet     Status: Abnormal   Collection Time: 10/01/14  4:13 AM  Result Value Ref Range   WBC 7.9 4.0 - 10.5 K/uL   RBC 3.05 (L) 4.22 - 5.81 MIL/uL   Hemoglobin 8.3 (L) 13.0 - 17.0 g/dL   HCT 28.8 (L) 39.0 - 52.0 %   MCV 94.4 78.0 - 100.0 fL   MCH 27.2 26.0 - 34.0 pg   MCHC 28.8 (L) 30.0 - 36.0 g/dL   RDW 18.6 (H) 11.5 - 15.5 %   Platelets 196 150 - 400 K/uL   Neutrophils Relative % 69 43 - 77 %   Neutro Abs 5.4 1.7 - 7.7 K/uL   Lymphocytes Relative 19 12 - 46 %   Lymphs Abs 1.5 0.7 - 4.0 K/uL   Monocytes Relative 10 3 - 12 %   Monocytes Absolute 0.8 0.1 - 1.0 K/uL   Eosinophils Relative 2 0 - 5 %   Eosinophils Absolute 0.2 0.0 - 0.7 K/uL   Basophils Relative 0 0 - 1 %   Basophils Absolute 0.0 0.0 - 0.1 K/uL  Basic metabolic panel     Status: Abnormal   Collection Time: 10/01/14  4:13 AM  Result Value Ref Range   Sodium 138 135 - 145 mmol/L    Potassium 4.1 3.5 - 5.1 mmol/L   Chloride 90 (L) 96 - 112 mmol/L   CO2 37 (H) 19 - 32 mmol/L   Glucose, Bld 133 (H) 70 - 99 mg/dL   BUN 89 (H) 6 - 23 mg/dL   Creatinine, Ser 2.50 (H) 0.50 - 1.35 mg/dL   Calcium 8.7 8.4 - 10.5 mg/dL   GFR calc non Af Amer 23 (L) >90 mL/min   GFR calc Af Amer 26 (L) >90 mL/min    Comment: (NOTE) The eGFR has been calculated using the CKD EPI equation. This calculation has not been validated in all clinical situations. eGFR's persistently <90 mL/min signify possible Chronic Kidney Disease.    Anion gap 11 5 - 15    ABGS No results for input(s): PHART, PO2ART, TCO2, HCO3 in the last 72 hours.  Invalid input(s): PCO2 CULTURES Recent Results (from the past 240 hour(s))  MRSA PCR Screening     Status: None   Collection Time: 09/22/14  6:38 PM  Result Value Ref Range Status   MRSA by PCR NEGATIVE NEGATIVE Final    Comment:        The GeneXpert MRSA Assay (FDA approved for NASAL specimens only), is one component of a comprehensive MRSA colonization surveillance program. It is not intended to diagnose MRSA infection nor to guide or monitor treatment for MRSA infections.   Clostridium Difficile by PCR     Status: None   Collection Time: 09/30/14  1:55 PM  Result Value Ref Range Status   C difficile by pcr NEGATIVE NEGATIVE Final   Studies/Results: No results found.  Medications:  Prior to Admission:  Prescriptions prior to admission  Medication Sig Dispense Refill Last Dose  . atorvastatin (LIPITOR) 20 MG tablet Take 20 mg by mouth daily.   09/21/2014 at Unknown time  . levothyroxine (SYNTHROID, LEVOTHROID) 125 MCG tablet Take 125 mcg by mouth daily before breakfast.   09/21/2014 at Unknown time  . Multiple Vitamins-Minerals (CENTRUM SILVER PO) Take 1 tablet by mouth daily.   09/21/2014 at Unknown time  . aspirin EC 81 MG tablet Take 81 mg by mouth every other day.    09/20/2014  . carvedilol (COREG)  3.125 MG tablet Take 1 tablet (3.125 mg total)  by mouth 2 (two) times daily with a meal. 60 tablet 12 Taking   Scheduled: . antiseptic oral rinse  7 mL Mouth Rinse BID  . atorvastatin  20 mg Oral q1800  . feeding supplement (ENSURE COMPLETE)  237 mL Oral BID BM  . furosemide  40 mg Oral Daily  . levothyroxine  125 mcg Oral QAC breakfast  . pantoprazole  40 mg Oral Daily  . polyethylene glycol  17 g Oral Daily   Continuous: . DOBUTamine 2.5 mcg/kg/min (10/01/14 0300)  . heparin 1,500 Units/hr (10/01/14 0300)   RCB:ULAGTX chloride, acetaminophen, ondansetron (ZOFRAN) IV, sodium chloride, sodium chloride  Assesment:he was admitted after a fall and was found to have atrial flutter fibrillation which is a new problem. He has had acute combined systolic and diastolic heart failure and has been on dobutamine and that is much improved. He had ejection fraction of 40-45% about a year ago and is down to 20-25% now. He has acute on chronic renal failure which is improving. He has protein calorie malnutrition and he is eating a little bit better. He has anemia both from GI blood loss and anemia of chronic disease. He has heme positive stools 3 in my office.  Principal Problem:   Acute combined systolic and diastolic congestive heart failure Active Problems:   Anemia of chronic disease   Chronic renal insufficiency, stage III (moderate)   Abnormal EKG- known IVCD (LBBB type)   Fall   Acute respiratory failure with hypoxia   Atrial fibrillation/ atrilal flutter- new   Elevated troponin   Acute on chronic renal insufficiency   Hyperkalemia   Elevated LFTs   Type 2 diabetes mellitus with renal manifestations   Cardiomyopathy-etiology undetermined- EF 40-45% by echo Jan 2015   Anemia due to GI blood loss   Protein-calorie malnutrition, severe    Plan:discontinue dobutamine. It was anticipated this would be discontinued after current bag infuses but that may take another 36 hours so I'm going to go ahead and discontinue it.transfer from ICU  to discuss with GI consultants about when he can have procedures done    LOS: 9 days   Greg [name] L 10/01/2014, 7:31 AM

## 2014-10-01 NOTE — Progress Notes (Signed)
Primary cardiologist: Dr. Carlyle Dolly  Seen for followup: Atrial arrhythmia, acute on chronic systolic heart failure  Subjective:    Appetite better. No chest pain or palpitations. Arm and leg edema much better.  Objective:   Temp:  [97.4 F (36.3 C)-98.4 F (36.9 C)] 97.4 F (36.3 C) (02/12 0700) Pulse Rate:  [38-140] 66 (02/12 0700) Resp:  [14-24] 18 (02/12 0700) BP: (96-138)/(35-71) 118/47 mmHg (02/12 0700) SpO2:  [95 %-100 %] 98 % (02/12 0700) Weight:  [214 lb 4.6 oz (97.2 kg)] 214 lb 4.6 oz (97.2 kg) (02/12 0500) Last BM Date: 10/01/14  Filed Weights   09/29/14 0500 09/30/14 0500 10/01/14 0500  Weight: 219 lb 5.7 oz (99.5 kg) 217 lb 9.5 oz (98.7 kg) 214 lb 4.6 oz (97.2 kg)    Intake/Output Summary (Last 24 hours) at 10/01/14 0802 Last data filed at 10/01/14 0700  Gross per 24 hour  Intake  638.4 ml  Output   1700 ml  Net -1061.6 ml    Telemetry: Rate controlled atrial flutter with largely 3:1 block, occasional PVCs.  Exam:  General: Overweight, chronically ill-appearing, no distress.  Lungs: Diminished breath sounds at the bases.  Cardiac: Regular rate and rhythm ectopy, no S3 gallop.  Abdomen: Obese, nontender.  Extremities: Trace leg edema, stasis.  Lab Results:  Basic Metabolic Panel:  Recent Labs Lab 09/25/14 0446  09/29/14 0444 09/30/14 0438 10/01/14 0413  NA  --   < > 139 137 138  K  --   < > 3.9 4.0 4.1  CL  --   < > 93* 92* 90*  CO2  --   < > 36* 35* 37*  GLUCOSE  --   < > 137* 146* 133*  BUN  --   < > 84* 88* 89*  CREATININE  --   < > 2.55* 2.64* 2.50*  CALCIUM  --   < > 8.8 8.4 8.7  MG 2.1  --   --   --   --   < > = values in this interval not displayed.  Liver Function Tests:  Recent Labs Lab 09/25/14 0452 09/27/14 0537  AST 146* 70*  ALT 302* 199*  ALKPHOS 62 55  BILITOT 0.7 0.9  PROT 6.0 5.8*  ALBUMIN 3.0* 2.8*    CBC:  Recent Labs Lab 09/29/14 0444 09/30/14 0438 10/01/14 0413  WBC 8.4 8.1 7.9  HGB  8.6* 8.7* 8.3*  HCT 29.0* 29.5* 28.8*  MCV 92.4 93.4 94.4  PLT 180 193 196    Echocardiogram 09/23/14: Study Conclusions  - Procedure narrative: Transthoracic echocardiography. Image quality was adequate. The study was technically difficult, as a result of poor patient compliance. - Left ventricle: The cavity size was normal. Systolic function was severely reduced. The estimated ejection fraction was in the range of 20% to 25%. The study was not technically sufficient to allow evaluation of LV diastolic dysfunction due to atrial fibrillation. Doppler parameters are consistent with high ventricular filling pressure. Mild posterior wall with severe asymmetric septal hypertrophy (2.16 cm). - Ventricular septum: Septal motion showed abnormal function and dyssynergy. These changes are consistent with a left bundle branch block. - Aortic valve: Mildly calcified annulus. Trileaflet; mildly thickened leaflets. There was trivial regurgitation. - Mitral valve: Mildly thickened annulus. Mildly thickened leaflets . There was mild regurgitation. - Left atrium: The atrium was severely dilated. Volume/bsa, ES, (1-plane Simpson&'s, A2C): 42.6 ml/m^2. - Right ventricle: The cavity size was mildly dilated. Systolic function was mildly reduced. -  Right atrium: The atrium was mildly to moderately dilated. - Tricuspid valve: There was mild regurgitation. - Pulmonary arteries: PA peak pressure: 50 mm Hg (S). Moderately elevated pulmonary pressures. - Systemic veins: IVC is dilated with normal respiratory variation. Estimated CVP 8 mmHg.   Medications:   Scheduled Medications: . antiseptic oral rinse  7 mL Mouth Rinse BID  . atorvastatin  20 mg Oral q1800  . carvedilol  3.125 mg Oral BID WC  . feeding supplement (ENSURE COMPLETE)  237 mL Oral BID BM  . levothyroxine  125 mcg Oral QAC breakfast  . pantoprazole  40 mg Oral Daily  . polyethylene glycol  17 g Oral  Daily  . torsemide  60 mg Oral Daily    Infusions: . heparin 1,500 Units/hr (10/01/14 0700)    PRN Medications: sodium chloride, acetaminophen, ondansetron (ZOFRAN) IV, sodium chloride, sodium chloride   Assessment:   1. Acute on chronic systolic heart failure, LVEF in the range of 20-25% by follow-up echocardiogram. Dobutamine infusion now discontinued after substantial diuresis and improvement in renal function. Creatinine stable at 2.5 today. Urine output has decreased somewhat, but still out greater than in by 1000 cc last 24 hours. Weight is down nearly 30 pounds overall. He is now on oral Demadex.  2. Atrial flutter with controlled ventricular response. CHADSVASC score is 6. Suboptimal candidate for long-term anticoagulation, but will continue to reconsider depending on clinical course and workup for possible GI bleed.  3. Right upper extremity DVT, currently on IV heparin. He has had no obvious bleeding with this intervention. Will still need to be on an oral anticoagulant at least temporarily for this diagnosis.  4. Acute on chronic renal failure, creatinine has stabilized around 2.5. Nephrology following.  5. Anemia with history of GI bleed. Gastroenterology evaluating, patient for possible EGD and colonoscopy.   Plan/Discussion:    As noted above, dobutamine discontinued, diuretic is now oral Demadex. Also starting back low-dose Coreg. No ACE inhibitor or ARB with present renal dysfunction. Systolic blood pressure running 100-120. Might be able to consider adding very low-dose hydralazine next, but would hold off for now. Stable for transfer to telemetry. Await GI input regarding plan for endoscopy and further evaluation of GI bleed as this will help to guide further anticoagulation recommendations.  Satira Sark, M.D., F.A.C.C.

## 2014-10-01 NOTE — Progress Notes (Signed)
Subjective: Interval History: Patient feels much better and no nausea or vomiting. His appetite is also getting better  Objective: Vital signs in last 24 hours: Temp:  [97.4 F (36.3 C)-98.4 F (36.9 C)] 97.5 F (36.4 C) (02/12 0400) Pulse Rate:  [38-140] 70 (02/12 0500) Resp:  [14-25] 16 (02/12 0500) BP: (96-138)/(35-71) 100/49 mmHg (02/12 0500) SpO2:  [94 %-100 %] 99 % (02/12 0500) Weight:  [97.2 kg (214 lb 4.6 oz)] 97.2 kg (214 lb 4.6 oz) (02/12 0500) Weight change: -1.5 kg (-3 lb 4.9 oz)  Intake/Output from previous day: 02/11 0701 - 02/12 0700 In: 499.2 [I.V.:499.2] Out: 1700 [Urine:1700] Intake/Output this shift:    Generally: Patient is alert and eating his breakfast. Chest decreased breath sound bilaterally other wise clear Heart exam revealed iregular rate and rhythm no murmur Extremities: He has trace to  edema  Lab Results:  Recent Labs  09/30/14 0438 10/01/14 0413  WBC 8.1 7.9  HGB 8.7* 8.3*  HCT 29.5* 28.8*  PLT 193 196   BMET:   Recent Labs  09/30/14 0438 10/01/14 0413  NA 137 138  K 4.0 4.1  CL 92* 90*  CO2 35* 37*  GLUCOSE 146* 133*  BUN 88* 89*  CREATININE 2.64* 2.50*  CALCIUM 8.4 8.7   No results for input(s): PTH in the last 72 hours. Iron Studies:  No results for input(s): IRON, TIBC, TRANSFERRIN, FERRITIN in the last 72 hours.  Studies/Results: No results found.  I have reviewed the patient's current medications.  Assessment/Plan: Problem #1 CHF. Presently he is on Lasix and dobutamine. He had 1700 mL of urine output. Patient denies any difficulty in breathing. Patient denies also orthopnea Problem #2 acute on chronic renal failure. Presently his BUN and creatinine to improve and nearing his bae line problem #3 hyperkalemia his potassium has corrected Problem #4 anemia: Patient with history of GI bleeding. His hemoglobin is low but stable. Problem #5 type 2 diabetes Problem #6 elevated troponin: Patient presently denies any  chest pain. Problem #7 atrial fibrillation: His heart rate is fluctuating but presently seems to have controlled. Problem #8 elevated LFTsPresently improving.  Problem #9  DVT: Patient on heparin. Plan: d/c iv lasix Start Demadex 60 mg po once a day D/c Dobutamine We'll check his basic metabolic panel in the morning.     LOS: 9 days   Delmo Matty S 10/01/2014,7:35 AM

## 2014-10-01 NOTE — Progress Notes (Signed)
Subjective: Patient resting comfortable but tired. Just completed PT session. Per patient and family patient is feeling better every day, breathing better, appetite improved. Ate 100% of breakfast, tolerated well. Denies abdominal pain, N/V. Has had 2 bowel movements in the past 2 days which are described as soft/loose but not watery. Denies hematochezia and melena. No symptoms of any continued GI bleed.  Objective: Vital signs in last 24 hours: Temp:  [97.4 F (36.3 C)-98.4 F (36.9 C)] 97.4 F (36.3 C) (02/12 0700) Pulse Rate:  [39-140] 75 (02/12 1101) Resp:  [14-24] 18 (02/12 0700) BP: (96-138)/(39-70) 109/41 mmHg (02/12 1101) SpO2:  [95 %-100 %] 98 % (02/12 0700) Weight:  [214 lb 4.6 oz (97.2 kg)] 214 lb 4.6 oz (97.2 kg) (02/12 0500) Last BM Date: 10/01/14 General:   Alert and oriented, pleasant. Sleepy. Head:  Normocephalic and atraumatic. Eyes:  No icterus, sclera clear. Conjuctiva pink.  Heart:  IRR, no murmurs noted.  Lungs: Clear to auscultation bilaterally, without wheezing, rales, or rhonchi.  Abdomen:  Bowel sounds present, soft, non-tender, non-distended. No HSM or hernias noted. No rebound or guarding. No masses appreciated  Extremities:  Improving LE edema. Neurologic:  Alert and  oriented x4;  grossly normal neurologically. Skin:  Warm and dry, intact without significant lesions.  Psych:  Alert and cooperative. Normal mood and affect.  Intake/Output from previous day: 02/11 0701 - 02/12 0700 In: 638.4 [P.O.:120; I.V.:518.4] Out: 1700 [Urine:1700] Intake/Output this shift:    Lab Results:  Recent Labs  09/29/14 0444 09/30/14 0438 10/01/14 0413  WBC 8.4 8.1 7.9  HGB 8.6* 8.7* 8.3*  HCT 29.0* 29.5* 28.8*  PLT 180 193 196   BMET  Recent Labs  09/29/14 0444 09/30/14 0438 10/01/14 0413  NA 139 137 138  K 3.9 4.0 4.1  CL 93* 92* 90*  CO2 36* 35* 37*  GLUCOSE 137* 146* 133*  BUN 84* 88* 89*  CREATININE 2.55* 2.64* 2.50*  CALCIUM 8.8 8.4 8.7    LFT No results for input(s): PROT, ALBUMIN, AST, ALT, ALKPHOS, BILITOT, BILIDIR, IBILI in the last 72 hours. PT/INR No results for input(s): LABPROT, INR in the last 72 hours. Hepatitis Panel No results for input(s): HEPBSAG, HCVAB, HEPAIGM, HEPBIGM in the last 72 hours.   Studies/Results: No results found.  Assessment: 81 year old male admitted with mixed CHF, new onset A. fib, demand ischemia, hypertension who also has history of recent transfusion dependent anemia. Early January his hemoglobin was in the 5 range. Over the past 4 weeks he has received 4 units of packed red blood cells. No reported overt GI bleeding although he was strongly Hemoccult positive. No prior EGD or colonoscopy. Has been on IV heparin gtt for DVT and dobutamine, which has been titrated to off. Per patient and notes, plans to possibly transfer out of ICU to floor. Cardiopulmonary and renal statuses are improved and is more appropriate for continued GI workup at this point. No Hepatic function panel since 2/8 but at that time elevated LFTs had been declining. H/H remains stable, although low, with no signs/symptoms of continued GI bleed.    Plan: 1. Recheck hepatic function for LFT status 2. Continue to monitor for s/s of GI bleed 3. CBC in the morning 4. Continue PPI for GI prophylaxis 5. Plan for non-emergent colonoscopy/EGD possibly Monday to evaluate for etiology of anemia, possibly sooner if any change or overt GI bleed.   Walden Field, AGNP-C Adult & Gerontological Nurse Practitioner [facility]]  LOS: 9 days    10/01/2014, 12:31 PM

## 2014-10-01 NOTE — Plan of Care (Signed)
Problem: Phase I Progression Outcomes Goal: Dyspnea controlled at rest (HF) Outcome: Progressing Oxygen in use via nasal cannula

## 2014-10-01 NOTE — Clinical Social Work Placement (Signed)
Clinical Social Work Department CLINICAL SOCIAL WORK PLACEMENT NOTE 10/01/2014  Patient:  MELVEN, STOCKARD  Account Number:  [id number] Admit date:  09/22/2014  Clinical Social Worker:  [name], LCSW  Date/time:  09/29/2014 04:51 PM  Clinical Social Work is seeking post-discharge placement for this patient at the following level of care:   [place]   (*CSW will update this form in Epic as items are completed)   09/29/2014  Patient/family provided with [place] Department of Clinical Social Work's list of facilities offering this level of care within the geographic area requested by the patient (or if unable, by the patient's family).  09/29/2014  Patient/family informed of their freedom to choose among providers that offer the needed level of care, that participate in Medicare, Medicaid or managed care program needed by the patient, have an available bed and are willing to accept the patient.  09/29/2014  Patient/family informed of [facility]' ownership interest in [facility], as well as of the fact that they are under no obligation to receive care at this facility.  PASARR submitted to EDS on 09/29/2014 PASARR number received on 09/29/2014  FL2 transmitted to all facilities in geographic area requested by pt/family on  09/29/2014 FL2 transmitted to all facilities within larger geographic area on   Patient informed that his/her managed care company has contracts with or will negotiate with  certain facilities, including the following:     Patient/family informed of bed offers received:  10/01/2014 Patient chooses bed at [facility] Physician recommends and patient chooses bed at    Patient to be transferred to  on   Patient to be transferred to facility by  Patient and family notified of transfer on  Name of family member notified:    The following physician request were entered in Epic:   Additional Comments:  [name], [place]

## 2014-10-02 DIAGNOSIS — K259 Gastric ulcer, unspecified as acute or chronic, without hemorrhage or perforation: Secondary | ICD-10-CM

## 2014-10-02 DIAGNOSIS — K6389 Other specified diseases of intestine: Secondary | ICD-10-CM

## 2014-10-02 LAB — CBC
HEMATOCRIT: 27.8 % — AB (ref 39.0–52.0)
Hemoglobin: 8.3 g/dL — ABNORMAL LOW (ref 13.0–17.0)
MCH: 27.8 pg (ref 26.0–34.0)
MCHC: 29.9 g/dL — ABNORMAL LOW (ref 30.0–36.0)
MCV: 93 fL (ref 78.0–100.0)
Platelets: 216 10*3/uL (ref 150–400)
RBC: 2.99 MIL/uL — ABNORMAL LOW (ref 4.22–5.81)
RDW: 18.5 % — ABNORMAL HIGH (ref 11.5–15.5)
WBC: 7.1 10*3/uL (ref 4.0–10.5)

## 2014-10-02 LAB — COMPREHENSIVE METABOLIC PANEL
ALBUMIN: 2.8 g/dL — AB (ref 3.5–5.2)
ALT: 58 U/L — ABNORMAL HIGH (ref 0–53)
ANION GAP: 12 (ref 5–15)
AST: 34 U/L (ref 0–37)
Alkaline Phosphatase: 48 U/L (ref 39–117)
BILIRUBIN TOTAL: 0.6 mg/dL (ref 0.3–1.2)
BUN: 94 mg/dL — AB (ref 6–23)
CHLORIDE: 88 mmol/L — AB (ref 96–112)
CO2: 37 mmol/L — ABNORMAL HIGH (ref 19–32)
Calcium: 8.8 mg/dL (ref 8.4–10.5)
Creatinine, Ser: 2.45 mg/dL — ABNORMAL HIGH (ref 0.50–1.35)
GFR calc non Af Amer: 23 mL/min — ABNORMAL LOW (ref >90)
GFR, EST AFRICAN AMERICAN: 27 mL/min — AB (ref >90)
GLUCOSE: 129 mg/dL — AB (ref 70–99)
Potassium: 4.2 mmol/L (ref 3.5–5.1)
Sodium: 137 mmol/L (ref 135–145)
Total Protein: 5.9 g/dL — ABNORMAL LOW (ref 6.0–8.3)

## 2014-10-02 LAB — HEPARIN LEVEL (UNFRACTIONATED): Heparin Unfractionated: 0.34 IU/mL (ref 0.30–0.70)

## 2014-10-02 MED ORDER — BISACODYL 5 MG PO TBEC
10.0000 mg | DELAYED_RELEASE_TABLET | ORAL | Status: AC
  Administered 2014-10-02: 10 mg via ORAL
  Filled 2014-10-02: qty 2

## 2014-10-02 MED ORDER — POLYETHYLENE GLYCOL 3350 17 G PO PACK
17.0000 g | PACK | ORAL | Status: AC
  Administered 2014-10-02 (×3): 17 g via ORAL
  Filled 2014-10-02 (×3): qty 1

## 2014-10-02 MED ORDER — SODIUM CHLORIDE 0.9 % IV SOLN
510.0000 mg | Freq: Once | INTRAVENOUS | Status: AC
  Administered 2014-10-02: 510 mg via INTRAVENOUS
  Filled 2014-10-02: qty 17

## 2014-10-02 MED ORDER — SALINE SPRAY 0.65 % NA SOLN
2.0000 | Freq: Four times a day (QID) | NASAL | Status: DC
  Administered 2014-10-02 – 2014-10-15 (×43): 2 via NASAL
  Filled 2014-10-02 (×5): qty 44

## 2014-10-02 NOTE — Progress Notes (Signed)
Subjective: Interval History: Patient more alert today. He denies any difficulty breathing and overall feels good. Patient also denies any nausea or vomiting. Presently he offers no complaints.  Objective: Vital signs in last 24 hours: Temp:  [98 F (36.7 C)-98.5 F (36.9 C)] 98.5 F (36.9 C) (02/13 0548) Pulse Rate:  [53-84] 76 (02/13 0548) Resp:  [18-20] 20 (02/13 0548) BP: (109-119)/(41-58) 112/58 mmHg (02/13 0548) SpO2:  [94 %-99 %] 95 % (02/13 0548) Weight:  [97.8 kg (215 lb 9.8 oz)] 97.8 kg (215 lb 9.8 oz) (02/13 0548) Weight change: 0.6 kg (1 lb 5.2 oz)  Intake/Output from previous day: 02/12 0701 - 02/13 0700 In: 465 [P.O.:120; I.V.:345] Out: 1900 [Urine:1900] Intake/Output this shift:    Generally: Patient is alert and in no apparent distress. Chest decreased breath sound bilaterally other wise clear Heart exam revealed iregular rate and rhythm no murmur Extremities: He has trace to  edema  Lab Results:  Recent Labs  10/01/14 0413 10/02/14 0550  WBC 7.9 7.1  HGB 8.3* 8.3*  HCT 28.8* 27.8*  PLT 196 216   BMET:   Recent Labs  10/01/14 0413 10/02/14 0550  NA 138 137  K 4.1 4.2  CL 90* 88*  CO2 37* 37*  GLUCOSE 133* 129*  BUN 89* 94*  CREATININE 2.50* 2.45*  CALCIUM 8.7 8.8   No results for input(s): PTH in the last 72 hours. Iron Studies:  No results for input(s): IRON, TIBC, TRANSFERRIN, FERRITIN in the last 72 hours.  Studies/Results: No results found.  I have reviewed the patient's current medications.  Assessment/Plan: Problem #1 CHF. Presently he is on oral Demadex. Patient had 1900 mL of urine output and his CHF seems to be progressively improving. Problem #2 acute on chronic renal failure. Presently his BUN and creatinine has declined further. Patient presently does not have any uremic signs and symptoms. problem #3 hyperkalemia his potassium has corrected Problem #4 anemia: Patient with history of GI bleeding. His hemoglobin is low but  stable. Problem #5 type 2 diabetes Problem #6 elevated troponin: Patient presently denies any chest pain. Problem #7 atrial fibrillation: His heart rate is fluctuating but presently seems to have controlled. Problem #8 elevated LFTsPresently improving.  Problem #9  DVT: Patient on heparin. Plan: We'll continue with Demadex We'll give him the second dose of IV iron today. We'll check his basic metabolic panel in the morning.     LOS: 10 days   Jaleal Schliep S 10/02/2014,8:09 AM

## 2014-10-02 NOTE — Progress Notes (Signed)
Miralax prep given patient has had several stools since prep given.

## 2014-10-02 NOTE — Progress Notes (Signed)
Subjective: He looks better. He has been transferred out of the intensive care unit. He has no new complaints. He is eating a little bit better  Objective: Vital signs in last 24 hours: Temp:  [98 F (36.7 C)-98.5 F (36.9 C)] 98.5 F (36.9 C) (02/13 0548) Pulse Rate:  [53-84] 76 (02/13 0548) Resp:  [18-20] 20 (02/13 0548) BP: (109-119)/(41-58) 112/58 mmHg (02/13 0548) SpO2:  [94 %-99 %] 95 % (02/13 0548) Weight:  [97.8 kg (215 lb 9.8 oz)] 97.8 kg (215 lb 9.8 oz) (02/13 0548) Weight change: 0.6 kg (1 lb 5.2 oz) Last BM Date: 10/01/14  Intake/Output from previous day: 02/12 0701 - 02/13 0700 In: 465 [P.O.:120; I.V.:345] Out: 1900 [Urine:1900]  PHYSICAL EXAM General appearance: alert, cooperative and mild distress Resp: clear to auscultation bilaterally Cardio: irregularly irregular rhythm GI: soft, non-tender; bowel sounds normal; no masses,  no organomegaly Extremities: extremities normal, atraumatic, no cyanosis or edema  Lab Results:  Results for orders placed or performed during the hospital encounter of 09/22/14 (from the past 48 hour(s))  Clostridium Difficile by PCR     Status: None   Collection Time: 09/30/14  1:55 PM  Result Value Ref Range   C difficile by pcr NEGATIVE NEGATIVE  Heparin level (unfractionated)     Status: None   Collection Time: 10/01/14  4:13 AM  Result Value Ref Range   Heparin Unfractionated 0.47 0.30 - 0.70 IU/mL    Comment:        IF HEPARIN RESULTS ARE BELOW EXPECTED VALUES, AND PATIENT DOSAGE HAS BEEN CONFIRMED, SUGGEST FOLLOW UP TESTING OF ANTITHROMBIN III LEVELS.   CBC with Differential/Platelet     Status: Abnormal   Collection Time: 10/01/14  4:13 AM  Result Value Ref Range   WBC 7.9 4.0 - 10.5 K/uL   RBC 3.05 (L) 4.22 - 5.81 MIL/uL   Hemoglobin 8.3 (L) 13.0 - 17.0 g/dL   HCT 28.8 (L) 39.0 - 52.0 %   MCV 94.4 78.0 - 100.0 fL   MCH 27.2 26.0 - 34.0 pg   MCHC 28.8 (L) 30.0 - 36.0 g/dL   RDW 18.6 (H) 11.5 - 15.5 %   Platelets  196 150 - 400 K/uL   Neutrophils Relative % 69 43 - 77 %   Neutro Abs 5.4 1.7 - 7.7 K/uL   Lymphocytes Relative 19 12 - 46 %   Lymphs Abs 1.5 0.7 - 4.0 K/uL   Monocytes Relative 10 3 - 12 %   Monocytes Absolute 0.8 0.1 - 1.0 K/uL   Eosinophils Relative 2 0 - 5 %   Eosinophils Absolute 0.2 0.0 - 0.7 K/uL   Basophils Relative 0 0 - 1 %   Basophils Absolute 0.0 0.0 - 0.1 K/uL  Basic metabolic panel     Status: Abnormal   Collection Time: 10/01/14  4:13 AM  Result Value Ref Range   Sodium 138 135 - 145 mmol/L   Potassium 4.1 3.5 - 5.1 mmol/L   Chloride 90 (L) 96 - 112 mmol/L   CO2 37 (H) 19 - 32 mmol/L   Glucose, Bld 133 (H) 70 - 99 mg/dL   BUN 89 (H) 6 - 23 mg/dL   Creatinine, Ser 2.50 (H) 0.50 - 1.35 mg/dL   Calcium 8.7 8.4 - 10.5 mg/dL   GFR calc non Af Amer 23 (L) >90 mL/min   GFR calc Af Amer 26 (L) >90 mL/min    Comment: (NOTE) The eGFR has been calculated using the CKD EPI equation.  This calculation has not been validated in all clinical situations. eGFR's persistently <90 mL/min signify possible Chronic Kidney Disease.    Anion gap 11 5 - 15  Heparin level (unfractionated)     Status: None   Collection Time: 10/02/14  5:49 AM  Result Value Ref Range   Heparin Unfractionated 0.34 0.30 - 0.70 IU/mL    Comment:        IF HEPARIN RESULTS ARE BELOW EXPECTED VALUES, AND PATIENT DOSAGE HAS BEEN CONFIRMED, SUGGEST FOLLOW UP TESTING OF ANTITHROMBIN III LEVELS.   CBC     Status: Abnormal   Collection Time: 10/02/14  5:50 AM  Result Value Ref Range   WBC 7.1 4.0 - 10.5 K/uL   RBC 2.99 (L) 4.22 - 5.81 MIL/uL   Hemoglobin 8.3 (L) 13.0 - 17.0 g/dL   HCT 27.8 (L) 39.0 - 52.0 %   MCV 93.0 78.0 - 100.0 fL   MCH 27.8 26.0 - 34.0 pg   MCHC 29.9 (L) 30.0 - 36.0 g/dL   RDW 18.5 (H) 11.5 - 15.5 %   Platelets 216 150 - 400 K/uL  Comprehensive metabolic panel     Status: Abnormal   Collection Time: 10/02/14  5:50 AM  Result Value Ref Range   Sodium 137 135 - 145 mmol/L    Potassium 4.2 3.5 - 5.1 mmol/L   Chloride 88 (L) 96 - 112 mmol/L   CO2 37 (H) 19 - 32 mmol/L   Glucose, Bld 129 (H) 70 - 99 mg/dL   BUN 94 (H) 6 - 23 mg/dL   Creatinine, Ser 2.45 (H) 0.50 - 1.35 mg/dL   Calcium 8.8 8.4 - 10.5 mg/dL   Total Protein 5.9 (L) 6.0 - 8.3 g/dL   Albumin 2.8 (L) 3.5 - 5.2 g/dL   AST 34 0 - 37 U/L   ALT 58 (H) 0 - 53 U/L   Alkaline Phosphatase 48 39 - 117 U/L   Total Bilirubin 0.6 0.3 - 1.2 mg/dL   GFR calc non Af Amer 23 (L) >90 mL/min   GFR calc Af Amer 27 (L) >90 mL/min    Comment: (NOTE) The eGFR has been calculated using the CKD EPI equation. This calculation has not been validated in all clinical situations. eGFR's persistently <90 mL/min signify possible Chronic Kidney Disease.    Anion gap 12 5 - 15    ABGS No results for input(s): PHART, PO2ART, TCO2, HCO3 in the last 72 hours.  Invalid input(s): PCO2 CULTURES Recent Results (from the past 240 hour(s))  MRSA PCR Screening     Status: None   Collection Time: 09/22/14  6:38 PM  Result Value Ref Range Status   MRSA by PCR NEGATIVE NEGATIVE Final    Comment:        The GeneXpert MRSA Assay (FDA approved for NASAL specimens only), is one component of a comprehensive MRSA colonization surveillance program. It is not intended to diagnose MRSA infection nor to guide or monitor treatment for MRSA infections.   Clostridium Difficile by PCR     Status: None   Collection Time: 09/30/14  1:55 PM  Result Value Ref Range Status   C difficile by pcr NEGATIVE NEGATIVE Final   Studies/Results: No results found.  Medications:  Prior to Admission:  Prescriptions prior to admission  Medication Sig Dispense Refill Last Dose  . atorvastatin (LIPITOR) 20 MG tablet Take 20 mg by mouth daily.   09/21/2014 at Unknown time  . levothyroxine (SYNTHROID, LEVOTHROID) 125 MCG tablet Take  125 mcg by mouth daily before breakfast.   09/21/2014 at Unknown time  . Multiple Vitamins-Minerals (CENTRUM SILVER PO) Take  1 tablet by mouth daily.   09/21/2014 at Unknown time  . aspirin EC 81 MG tablet Take 81 mg by mouth every other day.    09/20/2014  . carvedilol (COREG) 3.125 MG tablet Take 1 tablet (3.125 mg total) by mouth 2 (two) times daily with a meal. 60 tablet 12 Taking   Scheduled: . antiseptic oral rinse  7 mL Mouth Rinse BID  . atorvastatin  20 mg Oral q1800  . carvedilol  3.125 mg Oral BID WC  . feeding supplement (ENSURE COMPLETE)  237 mL Oral BID BM  . ferumoxytol  510 mg Intravenous Once  . levothyroxine  125 mcg Oral QAC breakfast  . pantoprazole  40 mg Oral Daily  . polyethylene glycol  17 g Oral Daily  . torsemide  60 mg Oral Daily   Continuous: . heparin 1,500 Units/hr (10/01/14 2316)   RWC:HJSCBI chloride, acetaminophen, ondansetron (ZOFRAN) IV, sodium chloride, sodium chloride  Assesment: He was admitted after a fall. He was brought to the emergency department and was found to be in atrial flutter fibrillation with rapid ventricular response. He also was noted to have acute combined systolic and diastolic heart failure with reduction in his ejection fraction from 40-45% a year ago to 20-25%. He has been on dobutamine with excellent results. He had acute on chronic renal insufficiency and that is improving. He has protein calorie malnutrition and is eating better. He has anemia which is from anemia of chronic disease as well as GI blood loss with 3 of 3 positive stools in my office. Principal Problem:   Acute combined systolic and diastolic congestive heart failure Active Problems:   Anemia of chronic disease   Chronic renal insufficiency, stage III (moderate)   Abnormal EKG- known IVCD (LBBB type)   Fall   Acute respiratory failure with hypoxia   Atrial fibrillation/ atrilal flutter- new   Elevated troponin   Acute on chronic renal insufficiency   Hyperkalemia   Elevated LFTs   Type 2 diabetes mellitus with renal manifestations   Cardiomyopathy-etiology undetermined- EF 40-45% by  echo Jan 2015   Anemia due to GI blood loss   Protein-calorie malnutrition, severe    Plan: Continue current treatments. He is continuing to improve. He will have GI procedures on the 15th.    LOS: 10 days   Greg [name] L 10/02/2014, 10:10 AM

## 2014-10-02 NOTE — Progress Notes (Signed)
[place] for Heparin Indication: DVT  Allergies  Allergen Reactions  . Celery Oil     vomiting   Patient Measurements: Height: 5\' 11"  (180.3 cm) Weight: 215 lb 9.8 oz (97.8 kg) IBW/kg (Calculated) : 75.3 Heparin Dosing Weight: 97.8 kg  Vital Signs: Temp: 98.5 F (36.9 C) (02/13 0548) Temp Source: Oral (02/13 0548) BP: 112/58 mmHg (02/13 0548) Pulse Rate: 76 (02/13 0548)  Labs:  Recent Labs  09/30/14 0438 10/01/14 0413 10/02/14 0549 10/02/14 0550  HGB 8.7* 8.3*  --  8.3*  HCT 29.5* 28.8*  --  27.8*  PLT 193 196  --  216  HEPARINUNFRC 0.39 0.47 0.34  --   CREATININE 2.64* 2.50*  --  2.45*   Estimated Creatinine Clearance: 28.7 mL/min (by C-G formula based on Cr of 2.45).  Medical History: Past Medical History  Diagnosis Date  . Type 2 diabetes mellitus   . Anemia of chronic disease   . CKD (chronic kidney disease) stage 3, GFR 30-59 ml/min   . Herpes zoster   . Obesity   . Exposure to hazardous substance   . Intermittent Leukopenia   . Essential hypertension   . Sleep apnea     Sleep Apnea score of 7  . Cardiomyopathy     LVEF 40-45% January 2015  . Left bundle branch block    Medications:  Scheduled:  . antiseptic oral rinse  7 mL Mouth Rinse BID  . atorvastatin  20 mg Oral q1800  . carvedilol  3.125 mg Oral BID WC  . feeding supplement (ENSURE COMPLETE)  237 mL Oral BID BM  . ferumoxytol  510 mg Intravenous Once  . levothyroxine  125 mcg Oral QAC breakfast  . pantoprazole  40 mg Oral Daily  . polyethylene glycol  17 g Oral Daily  . torsemide  60 mg Oral Daily   Assessment: [name] admitted with new onset Afib & transfusion dependent anemia, FOBT x3 as outpatient.  Anticoagulation was initially held pending GI work-up.  Venous doppler ultrasound now positive for right upper extremity axillary and brachial acute occlusive DVT and pharmacy asked to initiate heparin.  CBC has stabilized.  No overt bleeding noted.   H/H stable. Heparin level therapeutic.  GI Plan = non-emergent colonoscopy/EGD possibly Monday.  Per cardiology: Atrial flutter with controlled ventricular response. CHADSVASC score is 6. Suboptimal candidate for long-term anticoagulation, but will continue to reconsider depending on clinical course and workup for possible GI bleed.  Goal of Therapy:  Heparin level 0.3-0.7 units/ml Monitor platelets by anticoagulation protocol: Yes   Plan:  Continue heparin infusion at 1500 units/hr Daily heparin level & CBC while on heparin F/U GI plans and recommendations F/U plan for long-term anticoagulation once ok with GI / cardiology  Pricilla Larsson 10/02/2014,10:34 AM

## 2014-10-02 NOTE — Progress Notes (Addendum)
Patient ID: Greg [name], male   DOB: 12-08-1931, 81 y.o.   MRN: [id number] Assessment/Plan: Admitted with anemia/demand ischemia and fluid overload. Clinically improved.  PLAN: 1. FULL LIQUID DIET NOW 2. MIRALAX Q1Hx3 TODAY. MIRALAX PREP FEB 14. 3. DULCOLAX 10MG  x1 TODAY 4. MONITOR FOR FLUID OVERLOAD. 5. NASAL SALINE SPRAY 2 SPRAYS QID. 6. EGD/TCS WITH FENTANYL ON FEB 15.   Subjective: Since I last evaluated the patient HE FEELS BETTER. NO MORE NOSE BLEEDS. BOWELS MOVING. NO NAUSEA OR VOMITING. TOLERATING POs.  Objective: Vital signs in last 24 hours: Filed Vitals:   10/02/14 0548  BP: 112/58  Pulse: 76  Temp: 98.5 F (36.9 C)  Resp: 20   General appearance: alert, cooperative and no distress Resp: clear to auscultation bilaterally IN ANTERIOR LUNG Greg [name] Cardio: regular rhythm GI: soft, non-tender; bowel sounds normal; no masses,  no organomegaly Extremities: edema 2+ PEDAL BILATERAL  Lab Results: Hb 8.3 PLT 216 Cr 2.45  Studies/Results: No results found.  Medications: I have reviewed the patient's current medications.   LOS: 5 days   Greg [name] 01/28/2014, 2:23 PM

## 2014-10-03 LAB — BLOOD GAS, ARTERIAL
Acid-Base Excess: 14.9 mmol/L — ABNORMAL HIGH (ref 0.0–2.0)
Bicarbonate: 39.5 mEq/L — ABNORMAL HIGH (ref 20.0–24.0)
O2 CONTENT: 4.5 L/min
O2 Saturation: 91.5 %
PH ART: 7.486 — AB (ref 7.350–7.450)
Patient temperature: 37
TCO2: 37.1 mmol/L (ref 0–100)
pCO2 arterial: 52.9 mmHg — ABNORMAL HIGH (ref 35.0–45.0)
pO2, Arterial: 57.9 mmHg — ABNORMAL LOW (ref 80.0–100.0)

## 2014-10-03 LAB — CBC
HCT: 28.3 % — ABNORMAL LOW (ref 39.0–52.0)
HEMOGLOBIN: 8.4 g/dL — AB (ref 13.0–17.0)
MCH: 27.6 pg (ref 26.0–34.0)
MCHC: 29.7 g/dL — ABNORMAL LOW (ref 30.0–36.0)
MCV: 93.1 fL (ref 78.0–100.0)
Platelets: 220 10*3/uL (ref 150–400)
RBC: 3.04 MIL/uL — ABNORMAL LOW (ref 4.22–5.81)
RDW: 18.7 % — AB (ref 11.5–15.5)
WBC: 7 10*3/uL (ref 4.0–10.5)

## 2014-10-03 LAB — BASIC METABOLIC PANEL
ANION GAP: 8 (ref 5–15)
BUN: 89 mg/dL — ABNORMAL HIGH (ref 6–23)
CO2: 40 mmol/L (ref 19–32)
CREATININE: 2.44 mg/dL — AB (ref 0.50–1.35)
Calcium: 8.7 mg/dL (ref 8.4–10.5)
Chloride: 89 mmol/L — ABNORMAL LOW (ref 96–112)
GFR calc non Af Amer: 23 mL/min — ABNORMAL LOW (ref >90)
GFR, EST AFRICAN AMERICAN: 27 mL/min — AB (ref >90)
GLUCOSE: 130 mg/dL — AB (ref 70–99)
Potassium: 4.3 mmol/L (ref 3.5–5.1)
Sodium: 137 mmol/L (ref 135–145)

## 2014-10-03 LAB — HEPARIN LEVEL (UNFRACTIONATED): Heparin Unfractionated: 0.41 IU/mL (ref 0.30–0.70)

## 2014-10-03 MED ORDER — POLYETHYLENE GLYCOL 3350 17 G PO PACK
17.0000 g | PACK | ORAL | Status: AC
  Administered 2014-10-03 (×3): 17 g via ORAL
  Filled 2014-10-03 (×2): qty 1

## 2014-10-03 MED ORDER — BISACODYL 5 MG PO TBEC
10.0000 mg | DELAYED_RELEASE_TABLET | ORAL | Status: AC
  Administered 2014-10-03 (×2): 10 mg via ORAL
  Filled 2014-10-03 (×2): qty 2

## 2014-10-03 MED ORDER — SODIUM CHLORIDE 0.9 % IV SOLN: 250.0000 mL | INTRAVENOUS | Status: DC | PRN

## 2014-10-03 MED ORDER — POLYETHYLENE GLYCOL 3350 17 G PO PACK
17.0000 g | PACK | Freq: Every day | ORAL | Status: DC
  Administered 2014-10-06 – 2014-10-12 (×6): 17 g via ORAL
  Filled 2014-10-03 (×8): qty 1

## 2014-10-03 MED ORDER — TORSEMIDE 20 MG PO TABS
60.0000 mg | ORAL_TABLET | Freq: Every day | ORAL | Status: DC
  Administered 2014-10-05 – 2014-10-06 (×2): 60 mg via ORAL
  Filled 2014-10-03 (×3): qty 3

## 2014-10-03 NOTE — Plan of Care (Signed)
Problem: Phase II Progression Outcomes Goal: Dyspnea controlled with activity Outcome: Progressing O2 in use via nasal cannula

## 2014-10-03 NOTE — Progress Notes (Signed)
[facility] for Heparin Indication: DVT  Allergies  Allergen Reactions  . Celery Oil     vomiting   Patient Measurements: Height: 5\' 11"  (180.3 cm) Weight: 220 lb 3.2 oz (99.882 kg) IBW/kg (Calculated) : 75.3 Heparin Dosing Weight: 97.8 kg  Vital Signs: Temp: 97.5 F (36.4 C) (02/14 0627) Temp Source: Oral (02/14 0627) BP: 119/54 mmHg (02/14 0739) Pulse Rate: 67 (02/14 0739)  Labs:  Recent Labs  10/01/14 0413 10/02/14 0549 10/02/14 0550 10/03/14 0510  HGB 8.3*  --  8.3*  --   HCT 28.8*  --  27.8*  --   PLT 196  --  216  --   HEPARINUNFRC 0.47 0.34  --  0.41  CREATININE 2.50*  --  2.45* 2.44*   Estimated Creatinine Clearance: 29.1 mL/min (by C-G formula based on Cr of 2.44).  Medical History: Past Medical History  Diagnosis Date  . Type 2 diabetes mellitus   . Anemia of chronic disease   . CKD (chronic kidney disease) stage 3, GFR 30-59 ml/min   . Herpes zoster   . Obesity   . Exposure to hazardous substance   . Intermittent Leukopenia   . Essential hypertension   . Sleep apnea     Sleep Apnea score of 7  . Cardiomyopathy     LVEF 40-45% January 2015  . Left bundle branch block    Medications:  Scheduled:  . antiseptic oral rinse  7 mL Mouth Rinse BID  . atorvastatin  20 mg Oral q1800  . carvedilol  3.125 mg Oral BID WC  . feeding supplement (ENSURE COMPLETE)  237 mL Oral BID BM  . levothyroxine  125 mcg Oral QAC breakfast  . pantoprazole  40 mg Oral Daily  . polyethylene glycol  17 g Oral Daily  . sodium chloride  2 spray Each Nare QID  . torsemide  60 mg Oral Daily   Assessment: [name] admitted with new onset Afib & transfusion dependent anemia, FOBT x3 as outpatient.  Anticoagulation was initially held pending GI work-up.  Venous doppler ultrasound now positive for right upper extremity axillary and brachial acute occlusive DVT and pharmacy asked to initiate heparin.  CBC has stabilized (pending 2/14).  No  overt bleeding noted. Heparin level therapeutic.  GI Plan = non-emergent colonoscopy/EGD possibly Monday.  Per cardiology: Atrial flutter with controlled ventricular response. CHADSVASC score is 6. Suboptimal candidate for long-term anticoagulation, but will continue to reconsider depending on clinical course and workup for possible GI bleed.  Goal of Therapy:  Heparin level 0.3-0.7 units/ml Monitor platelets by anticoagulation protocol: Yes   Plan:  Continue heparin infusion at 1500 units/hr Daily heparin level & CBC while on heparin GI plans and recommendations = Hold Heparin @ 7am on 2/15 for planned procedure. F/U plan for long-term anticoagulation once ok with GI / cardiology  Pricilla Larsson 10/03/2014,9:11 AM

## 2014-10-03 NOTE — Progress Notes (Signed)
Soft brown stool noted since prep started

## 2014-10-03 NOTE — Progress Notes (Addendum)
Patient ID: Greg [name], male   DOB: [birth year], 81 y.o.   MRN: [id number]   Assessment/Plan: Admitted with anemia/demand ischemia and fluid overload/CHF. Clinically improved.  PLAN: 1. FULL LIQUID DIET  2. TODAY-MIRALAX PREP Q1H X3 AND THEN REPEAT ON AROUND 8 PM FEB 14. 3. DULCOLAX 10MG  x2 TODAY 4. MONITOR FOR FLUID OVERLOAD. GENTLE HYDRATION AFTER MN. 5. ONE TAP WATER ENEMA FEB 15. EGD/TCS WITH FENTANYL ON FEB 15. 6. DISCUSSED WITH PHARMACY. AGREE HOLD HEPARIN AT 0700 FOR TCS/EGD AT NOON.   Subjective: Since I last evaluated the patient he has A PAD UNDER HIS LEFT HEEL. HIS NOSE WAS CLEANED OUT. No questions or concerns. HEPARING GTT INFUSING.  Objective: Vital signs in last 24 hours: Filed Vitals:   10/03/14 0739  BP: 119/54  Pulse: 67  Temp:   Resp:    General appearance: alert, cooperative and no distress Resp: clear to auscultation bilaterally Cardio: regular rate and rhythm GI: soft, non-tender; bowel sounds normal; no masses,  no organomegaly Extremities: edema TRACE BILATERAL LEs  Lab Results:  Hb 8.4 Cr 2.44 K 4.3  Studies/Results: No results found.  Medications: I have reviewed the patient's current medications.   LOS: 5 days   [name] 01/28/2014, 2:23 PM

## 2014-10-03 NOTE — Progress Notes (Signed)
Subjective: He says he feels okay. He has no new complaints. His breathing is doing pretty well.  Objective: Vital signs in last 24 hours: Temp:  [97.5 F (36.4 C)-97.9 F (36.6 C)] 97.5 F (36.4 C) (02/14 0627) Pulse Rate:  [63-84] 67 (02/14 0739) Resp:  [20] 20 (02/14 0627) BP: (96-119)/(33-54) 119/54 mmHg (02/14 0739) SpO2:  [94 %-100 %] 97 % (02/14 0627) Weight:  [99.882 kg (220 lb 3.2 oz)] 99.882 kg (220 lb 3.2 oz) (02/14 0627) Weight change: 2.082 kg (4 lb 9.4 oz) Last BM Date: 10/02/14  Intake/Output from previous day: 02/13 0701 - 02/14 0700 In: 537 [P.O.:360; I.V.:177] Out: 475 [Urine:475]  PHYSICAL EXAM General appearance: alert, cooperative and no distress Resp: clear to auscultation bilaterally Cardio: irregularly irregular rhythm GI: soft, non-tender; bowel sounds normal; no masses,  no organomegaly Extremities: extremities normal, atraumatic, no cyanosis or edema  Lab Results:  Results for orders placed or performed during the hospital encounter of 09/22/14 (from the past 48 hour(s))  Heparin level (unfractionated)     Status: None   Collection Time: 10/02/14  5:49 AM  Result Value Ref Range   Heparin Unfractionated 0.34 0.30 - 0.70 IU/mL    Comment:        IF HEPARIN RESULTS ARE BELOW EXPECTED VALUES, AND PATIENT DOSAGE HAS BEEN CONFIRMED, SUGGEST FOLLOW UP TESTING OF ANTITHROMBIN III LEVELS.   CBC     Status: Abnormal   Collection Time: 10/02/14  5:50 AM  Result Value Ref Range   WBC 7.1 4.0 - 10.5 K/uL   RBC 2.99 (L) 4.22 - 5.81 MIL/uL   Hemoglobin 8.3 (L) 13.0 - 17.0 g/dL   HCT 27.8 (L) 39.0 - 52.0 %   MCV 93.0 78.0 - 100.0 fL   MCH 27.8 26.0 - 34.0 pg   MCHC 29.9 (L) 30.0 - 36.0 g/dL   RDW 18.5 (H) 11.5 - 15.5 %   Platelets 216 150 - 400 K/uL  Comprehensive metabolic panel     Status: Abnormal   Collection Time: 10/02/14  5:50 AM  Result Value Ref Range   Sodium 137 135 - 145 mmol/L   Potassium 4.2 3.5 - 5.1 mmol/L   Chloride 88 (L) 96 -  112 mmol/L   CO2 37 (H) 19 - 32 mmol/L   Glucose, Bld 129 (H) 70 - 99 mg/dL   BUN 94 (H) 6 - 23 mg/dL   Creatinine, Ser 2.45 (H) 0.50 - 1.35 mg/dL   Calcium 8.8 8.4 - 10.5 mg/dL   Total Protein 5.9 (L) 6.0 - 8.3 g/dL   Albumin 2.8 (L) 3.5 - 5.2 g/dL   AST 34 0 - 37 U/L   ALT 58 (H) 0 - 53 U/L   Alkaline Phosphatase 48 39 - 117 U/L   Total Bilirubin 0.6 0.3 - 1.2 mg/dL   GFR calc non Af Amer 23 (L) >90 mL/min   GFR calc Af Amer 27 (L) >90 mL/min    Comment: (NOTE) The eGFR has been calculated using the CKD EPI equation. This calculation has not been validated in all clinical situations. eGFR's persistently <90 mL/min signify possible Chronic Kidney Disease.    Anion gap 12 5 - 15  Heparin level (unfractionated)     Status: None   Collection Time: 10/03/14  5:10 AM  Result Value Ref Range   Heparin Unfractionated 0.41 0.30 - 0.70 IU/mL    Comment:        IF HEPARIN RESULTS ARE BELOW EXPECTED VALUES, AND PATIENT  DOSAGE HAS BEEN CONFIRMED, SUGGEST FOLLOW UP TESTING OF ANTITHROMBIN III LEVELS.   Basic metabolic panel     Status: Abnormal   Collection Time: 10/03/14  5:10 AM  Result Value Ref Range   Sodium 137 135 - 145 mmol/L   Potassium 4.3 3.5 - 5.1 mmol/L   Chloride 89 (L) 96 - 112 mmol/L   CO2 40 (HH) 19 - 32 mmol/L    Comment: RESULT REPEATED AND VERIFIED CRITICAL RESULT CALLED TO, READ BACK BY AND VERIFIED WITH: C.THOMAS AT 7829 ON 10/03/14 BY S.VANHOORNE    Glucose, Bld 130 (H) 70 - 99 mg/dL   BUN 89 (H) 6 - 23 mg/dL   Creatinine, Ser 2.44 (H) 0.50 - 1.35 mg/dL   Calcium 8.7 8.4 - 10.5 mg/dL   GFR calc non Af Amer 23 (L) >90 mL/min   GFR calc Af Amer 27 (L) >90 mL/min    Comment: (NOTE) The eGFR has been calculated using the CKD EPI equation. This calculation has not been validated in all clinical situations. eGFR's persistently <90 mL/min signify possible Chronic Kidney Disease.    Anion gap 8 5 - 15  Blood gas, arterial     Status: Abnormal   Collection  Time: 10/03/14  6:50 AM  Result Value Ref Range   O2 Content 4.5 L/min   Delivery systems NASAL CANNULA    pH, Arterial 7.486 (H) 7.350 - 7.450   pCO2 arterial 52.9 (H) 35.0 - 45.0 mmHg   pO2, Arterial 57.9 (L) 80.0 - 100.0 mmHg   Bicarbonate 39.5 (H) 20.0 - 24.0 mEq/L   TCO2 37.1 0 - 100 mmol/L   Acid-Base Excess 14.9 (H) 0.0 - 2.0 mmol/L   O2 Saturation 91.5 %   Patient temperature 37.0    Collection site LEFT BRACHIAL    Drawn by COLLECTED BY RT    Sample type ARTERIAL    Allens test (pass/fail) NOT INDICATED (A) PASS    ABGS  Recent Labs  10/03/14 0650  PHART 7.486*  PO2ART 57.9*  TCO2 37.1  HCO3 39.5*   CULTURES Recent Results (from the past 240 hour(s))  Clostridium Difficile by PCR     Status: None   Collection Time: 09/30/14  1:55 PM  Result Value Ref Range Status   C difficile by pcr NEGATIVE NEGATIVE Final   Studies/Results: No results found.  Medications:  Prior to Admission:  Prescriptions prior to admission  Medication Sig Dispense Refill Last Dose  . atorvastatin (LIPITOR) 20 MG tablet Take 20 mg by mouth daily.   09/21/2014 at Unknown time  . levothyroxine (SYNTHROID, LEVOTHROID) 125 MCG tablet Take 125 mcg by mouth daily before breakfast.   09/21/2014 at Unknown time  . Multiple Vitamins-Minerals (CENTRUM SILVER PO) Take 1 tablet by mouth daily.   09/21/2014 at Unknown time  . aspirin EC 81 MG tablet Take 81 mg by mouth every other day.    09/20/2014  . carvedilol (COREG) 3.125 MG tablet Take 1 tablet (3.125 mg total) by mouth 2 (two) times daily with a meal. 60 tablet 12 Taking   Scheduled: . antiseptic oral rinse  7 mL Mouth Rinse BID  . atorvastatin  20 mg Oral q1800  . carvedilol  3.125 mg Oral BID WC  . feeding supplement (ENSURE COMPLETE)  237 mL Oral BID BM  . levothyroxine  125 mcg Oral QAC breakfast  . pantoprazole  40 mg Oral Daily  . polyethylene glycol  17 g Oral Daily  . sodium chloride  2 spray Each Nare QID  . torsemide  60 mg Oral Daily    Continuous: . heparin 1,500 Units/hr (10/02/14 1748)   PNT:IRWERX chloride, acetaminophen, ondansetron (ZOFRAN) IV, sodium chloride  Assesment: He was admitted with a fall and was found to have atrial fibrillation with rapid ventricular response and acute combined systolic and diastolic heart failure. He had acute on chronic renal insufficiency. His cardiomyopathy is much worse with his ejection fraction going down from about 45% a year ago to about 25% now. He's had GI bleeding and is sent for endoscopy etc. tomorrow. He has protein calorie malnutrition. Principal Problem:   Acute combined systolic and diastolic congestive heart failure Active Problems:   Anemia of chronic disease   Chronic renal insufficiency, stage III (moderate)   Abnormal EKG- known IVCD (LBBB type)   Fall   Acute respiratory failure with hypoxia   Atrial fibrillation/ atrilal flutter- new   Elevated troponin   Acute on chronic renal insufficiency   Hyperkalemia   Elevated LFTs   Type 2 diabetes mellitus with renal manifestations   Cardiomyopathy-etiology undetermined- EF 40-45% by echo Jan 2015   Anemia due to GI blood loss   Protein-calorie malnutrition, severe    Plan: Continue treatments. His CBC today is pending. Renal function is stable. For GI workup tomorrow. He will require nursing home placement    LOS: 11 days   Serita Degroote L 10/03/2014, 10:08 AM

## 2014-10-03 NOTE — Progress Notes (Signed)
CRITICAL VALUE ALERT  Critical value received:  CO2  Date of notification:  10/03/14  Time of notification:  0628  Critical value read back:Yes.    Nurse who received alert:  Deeann Dowse, RN  MD notified:  Everette Rank   Time of first page:  [phone]  Responding MD:  Dr. Everette Rank   Time MD responded:  [phone]  Ordered blood gas labs, patient is stable at this time.

## 2014-10-03 NOTE — Progress Notes (Signed)
Subjective: Interval History: Patient I'll follow with no complaints. He states that he is feeling pretty good. He denies any difficulty breathing.  Objective: Vital signs in last 24 hours: Temp:  [97.5 F (36.4 C)-97.9 F (36.6 C)] 97.5 F (36.4 C) (02/14 0627) Pulse Rate:  [63-84] 67 (02/14 0739) Resp:  [20] 20 (02/14 0627) BP: (96-119)/(33-54) 119/54 mmHg (02/14 0739) SpO2:  [94 %-100 %] 97 % (02/14 0627) Weight:  [99.882 kg (220 lb 3.2 oz)] 99.882 kg (220 lb 3.2 oz) (02/14 0627) Weight change: 2.082 kg (4 lb 9.4 oz)  Intake/Output from previous day: 02/13 0701 - 02/14 0700 In: 537 [P.O.:360; I.V.:177] Out: 475 [Urine:475] Intake/Output this shift:    Generally: Patient is alert and in no apparent distress. Chest decreased breath sound bilaterally other wise clear Heart exam revealed iregular rate and rhythm no murmur Extremities: He has trace to  edema  Lab Results:  Recent Labs  10/01/14 0413 10/02/14 0550  WBC 7.9 7.1  HGB 8.3* 8.3*  HCT 28.8* 27.8*  PLT 196 216   BMET:   Recent Labs  10/02/14 0550 10/03/14 0510  NA 137 137  K 4.2 4.3  CL 88* 89*  CO2 37* 40*  GLUCOSE 129* 130*  BUN 94* 89*  CREATININE 2.45* 2.44*  CALCIUM 8.8 8.7   No results for input(s): PTH in the last 72 hours. Iron Studies:  No results for input(s): IRON, TIBC, TRANSFERRIN, FERRITIN in the last 72 hours.  Studies/Results: No results found.  I have reviewed the patient's current medications.  Assessment/Plan: Problem #1 CHF. Presently he is on oral Demadex. Patient denies any difficulty breathing and seems to be more comfortable. Problem #2 acute on chronic renal failure. Presently his BUN and creatinine stable. Presently it didn't show any significant change. Patient presently does not have any uremic signs and symptoms. problem #3 hyperkalemia his potassium has corrected Problem #4 anemia: Patient with history of GI bleeding. His hemoglobin is low but stable. Patient has  received a total of 1 g of IV iron. Problem #5 type 2 diabetes Problem #6 elevated troponin: Patient presently denies any chest pain. Problem #7 atrial fibrillation: His heart rate is controlled. Problem #8 elevated LFTsPresently improving.  Problem #9  DVT: Patient on heparin. Plan: We'll continue with Demadex at present dose. We'll check his basic metabolic panel in the morning.     LOS: 11 days   Loukisha Gunnerson S 10/03/2014,7:43 AM

## 2014-10-03 NOTE — Progress Notes (Signed)
Patient asleep Saturation 98 no distress noted.

## 2014-10-04 ENCOUNTER — Encounter (HOSPITAL_COMMUNITY)
Admission: EM | Disposition: A | Discharge status: Skilled Nursing Facility | Payer: Self-pay | Source: Home / Self Care | Attending: Pulmonary Disease

## 2014-10-04 ENCOUNTER — Encounter (HOSPITAL_COMMUNITY): Payer: Self-pay | Admitting: *Deleted

## 2014-10-04 HISTORY — PX: ESOPHAGOGASTRODUODENOSCOPY: SHX5428

## 2014-10-04 HISTORY — PX: COLONOSCOPY: SHX5424

## 2014-10-04 LAB — CBC
HEMATOCRIT: 25.5 % — AB (ref 39.0–52.0)
Hemoglobin: 7.6 g/dL — ABNORMAL LOW (ref 13.0–17.0)
MCH: 27.9 pg (ref 26.0–34.0)
MCHC: 29.8 g/dL — AB (ref 30.0–36.0)
MCV: 93.8 fL (ref 78.0–100.0)
PLATELETS: 245 10*3/uL (ref 150–400)
RBC: 2.72 MIL/uL — ABNORMAL LOW (ref 4.22–5.81)
RDW: 18.9 % — ABNORMAL HIGH (ref 11.5–15.5)
WBC: 6.7 10*3/uL (ref 4.0–10.5)

## 2014-10-04 LAB — BASIC METABOLIC PANEL
Anion gap: 11 (ref 5–15)
BUN: 90 mg/dL — ABNORMAL HIGH (ref 6–23)
CHLORIDE: 88 mmol/L — AB (ref 96–112)
CO2: 39 mmol/L — ABNORMAL HIGH (ref 19–32)
CREATININE: 2.38 mg/dL — AB (ref 0.50–1.35)
Calcium: 8.8 mg/dL (ref 8.4–10.5)
GFR calc non Af Amer: 24 mL/min — ABNORMAL LOW (ref >90)
GFR, EST AFRICAN AMERICAN: 28 mL/min — AB (ref >90)
Glucose, Bld: 124 mg/dL — ABNORMAL HIGH (ref 70–99)
Potassium: 4.4 mmol/L (ref 3.5–5.1)
SODIUM: 138 mmol/L (ref 135–145)

## 2014-10-04 LAB — HEPARIN LEVEL (UNFRACTIONATED): HEPARIN UNFRACTIONATED: 0.49 [IU]/mL (ref 0.30–0.70)

## 2014-10-04 SURGERY — COLONOSCOPY
Anesthesia: Moderate Sedation

## 2014-10-04 MED ORDER — MIDAZOLAM HCL 5 MG/5ML IJ SOLN
INTRAMUSCULAR | Status: DC | PRN
  Administered 2014-10-04 (×4): 1 mg via INTRAVENOUS

## 2014-10-04 MED ORDER — PANTOPRAZOLE SODIUM 40 MG PO TBEC
40.0000 mg | DELAYED_RELEASE_TABLET | Freq: Two times a day (BID) | ORAL | Status: DC
  Administered 2014-10-04 – 2014-10-07 (×7): 40 mg via ORAL
  Filled 2014-10-04 (×7): qty 1

## 2014-10-04 MED ORDER — FENTANYL CITRATE 0.05 MG/ML IJ SOLN
INTRAMUSCULAR | Status: AC
  Filled 2014-10-04: qty 2

## 2014-10-04 MED ORDER — STERILE WATER FOR IRRIGATION IR SOLN
Status: DC | PRN
  Administered 2014-10-04: 13:00:00

## 2014-10-04 MED ORDER — MIDAZOLAM HCL 5 MG/5ML IJ SOLN
INTRAMUSCULAR | Status: AC
  Filled 2014-10-04: qty 5

## 2014-10-04 MED ORDER — FENTANYL CITRATE 0.05 MG/ML IJ SOLN
INTRAMUSCULAR | Status: DC | PRN
  Administered 2014-10-04 (×2): 25 ug via INTRAVENOUS

## 2014-10-04 MED ORDER — LIDOCAINE VISCOUS 2 % MT SOLN
OROMUCOSAL | Status: AC
  Filled 2014-10-04: qty 15

## 2014-10-04 NOTE — Progress Notes (Signed)
Pt is having an EGD so PT was not done.  We will try again tomorrow.

## 2014-10-04 NOTE — Clinical Social Work Note (Signed)
CSW updated PNC on pt. Anticipate d/c tomorrow per MD. Facility remains agreeable to accept pt.  [name], [place]

## 2014-10-04 NOTE — Progress Notes (Signed)
Patient ID: Greg [name], male   DOB: June 17, 1932, 81 y.o.   MRN: [id number]    Primary cardiologist: Dr. Carlyle Dolly  Seen for followup: Atrial arrhythmia, acute on chronic systolic heart failure  Subjective:    For EGD this am   Objective:   Temp:  [97.7 F (36.5 C)-98.2 F (36.8 C)] 97.7 F (36.5 C) (02/15 1201) Pulse Rate:  [52-74] 66 (02/15 1229) Resp:  [12-20] 12 (02/15 1201) BP: (100-126)/(34-65) 100/60 mmHg (02/15 1201) SpO2:  [94 %-99 %] 94 % (02/15 1201) Weight:  [100.6 kg (221 lb 12.5 oz)] 100.6 kg (221 lb 12.5 oz) (02/15 0630) Last BM Date: 10/04/14  Filed Weights   10/02/14 0548 10/03/14 0627 10/04/14 0630  Weight: 97.8 kg (215 lb 9.8 oz) 99.882 kg (220 lb 3.2 oz) 100.6 kg (221 lb 12.5 oz)    Intake/Output Summary (Last 24 hours) at 10/04/14 1253 Last data filed at 10/04/14 0859  Gross per 24 hour  Intake    405 ml  Output   1450 ml  Net  -1045 ml    Telemetry: Rate controlled atrial flutter with largely 3:1  70-80's   Exam:  General: Overweight, chronically ill-appearing, no distress.  Lungs: Diminished breath sounds at the bases.  Cardiac: Regular rate and rhythm ectopy, no S3 gallop.  Abdomen: Obese, nontender.  Extremities: Trace leg edema, stasis.  Lab Results:  Basic Metabolic Panel:  Recent Labs Lab 10/02/14 0550 10/03/14 0510 10/04/14 0519  NA 137 137 138  K 4.2 4.3 4.4  CL 88* 89* 88*  CO2 37* 40* 39*  GLUCOSE 129* 130* 124*  BUN 94* 89* 90*  CREATININE 2.45* 2.44* 2.38*  CALCIUM 8.8 8.7 8.8    Liver Function Tests:  Recent Labs Lab 10/02/14 0550  AST 34  ALT 58*  ALKPHOS 48  BILITOT 0.6  PROT 5.9*  ALBUMIN 2.8*    CBC:  Recent Labs Lab 10/02/14 0550 10/03/14 0946 10/04/14 0519  WBC 7.1 7.0 6.7  HGB 8.3* 8.4* 7.6*  HCT 27.8* 28.3* 25.5*  MCV 93.0 93.1 93.8  PLT 216 220 245    Echocardiogram 09/23/14: Study Conclusions  - Procedure narrative: Transthoracic echocardiography. Image quality was  adequate. The study was technically difficult, as a result of poor patient compliance. - Left ventricle: The cavity size was normal. Systolic function was severely reduced. The estimated ejection fraction was in the range of 20% to 25%. The study was not technically sufficient to allow evaluation of LV diastolic dysfunction due to atrial fibrillation. Doppler parameters are consistent with high ventricular filling pressure. Mild posterior wall with severe asymmetric septal hypertrophy (2.16 cm). - Ventricular septum: Septal motion showed abnormal function and dyssynergy. These changes are consistent with a left bundle branch block. - Aortic valve: Mildly calcified annulus. Trileaflet; mildly thickened leaflets. There was trivial regurgitation. - Mitral valve: Mildly thickened annulus. Mildly thickened leaflets . There was mild regurgitation. - Left atrium: The atrium was severely dilated. Volume/bsa, ES, (1-plane Simpson&'s, A2C): 42.6 ml/m^2. - Right ventricle: The cavity size was mildly dilated. Systolic function was mildly reduced. - Right atrium: The atrium was mildly to moderately dilated. - Tricuspid valve: There was mild regurgitation. - Pulmonary arteries: PA peak pressure: 50 mm Hg (S). Moderately elevated pulmonary pressures. - Systemic veins: IVC is dilated with normal respiratory variation. Estimated CVP 8 mmHg.   Medications:   Scheduled Medications: . [MAR Hold] antiseptic oral rinse  7 mL Mouth Rinse BID  . [MAR Hold] atorvastatin  20  mg Oral q1800  . [MAR Hold] carvedilol  3.125 mg Oral BID WC  . [MAR Hold] feeding supplement (ENSURE COMPLETE)  237 mL Oral BID BM  . fentaNYL      . [MAR Hold] levothyroxine  125 mcg Oral QAC breakfast  . lidocaine      . midazolam      . [MAR Hold] pantoprazole  40 mg Oral Daily  . [MAR Hold] polyethylene glycol  17 g Oral Daily  . [MAR Hold] sodium chloride  2 spray Each Nare QID  . [MAR Hold]  torsemide  60 mg Oral Daily    Infusions:    PRN Medications: [MAR Hold] sodium chloride, [MAR Hold] acetaminophen, [MAR Hold] ondansetron (ZOFRAN) IV, [MAR Hold] sodium chloride   Assessment:   1. Acute on chronic systolic heart failure, LVEF in the range of 20-25% by follow-up echocardiogram. Dobutamine infusion now discontinued after substantial diuresis and improvement in renal function. Creatinine stable at 2.38     2. Atrial flutter with controlled ventricular response. CHADSVASC score is 6. Suboptimal candidate for long-term anticoagulation, but will continue to reconsider depending on clinical course and workup for possible GI bleed.  3. Right upper extremity DVT, currently on IV heparin. He has had no obvious bleeding with this intervention. Will still need to be on an oral anticoagulant at least temporarily for this diagnosis.  4. Acute on chronic renal failure, creatinine has stabilized around 2.38. Nephrology following.  5. Anemia with history of GI bleed. Hct continues to fall.  Cannot consider oral anticoagulation until bleeding source identified    Plan/Discussion:    Awaiting EGD and anemia w/u to consider starting oral anticoagulation .  Given risks coumadin may be best option as it is reversable Of the NOAC;s pradaxa would be best as it has a monoclonal reversal agent available at [facility]

## 2014-10-04 NOTE — Op Note (Signed)
[facility] [address] [place], [address]   ENDOSCOPY PROCEDURE REPORT  PATIENT: Greg, [name]  MR#: [id number] BIRTHDATE: [birth year] , 74  yrs. old GENDER: male  ENDOSCOPIST: Danie Binder, MD REFERRED [id number] Hawkins, M.D.  Garfield Cornea, M.D.  Aviva Signs, M.D.  PROCEDURE DATE: 10/29/2014 PROCEDURE:   EGD w/ biopsy  INDICATIONS:anemia/MELENA. MEDICATIONS: TCS+ Fentanyl 25 mcg IV and Versed 1 mg IV TOPICAL ANESTHETIC:   Viscous Xylocaine ASA CLASS:  DESCRIPTION OF PROCEDURE:     Physical exam was performed.  Informed consent was obtained from the patient after explaining the benefits, risks, and alternatives to the procedure.  The patient was connected to the monitor and placed in the left lateral position.  Continuous oxygen was provided by nasal cannula and IV medicine administered through an indwelling cannula.  After administration of sedation, the patients esophagus was intubated and the [id number] ([id number])  endoscope was advanced under direct visualization to the second portion of the duodenum.  The scope was removed slowly by carefully examining the color, texture, anatomy, and integrity of the mucosa on the way out.  The patient was recovered in endoscopy and discharged home in satisfactory condition.   ESOPHAGUS: There was a benign appearing stricture at the gastroesophageal junction.  The stricture was easily traversable. STOMACH: A small hiatal hernia was noted.   Multiple small non-bleeding and clean-based ulcers with surrounding edema were found in the gastric antrum.  Biopsies were taken around the ulcers.   DUODENUM: Multiple small non-bleeding, clean-based, shallow and linear ulcers were found in the bulb and 2nd part of the duodenum. COMPLICATIONS: There were no immediate complications.  ENDOSCOPIC IMPRESSION: 1.   Stricture at the gastroesophageal junction 2.   Small hiatal hernia 3.   Multiple small ulcers in the gastric  antrum 4.   Multiple small ulcers in the bulb and 2nd part of the duodenum 5.   ANEMIA/MELENA DUE TO PUD/R COLON MASS  RECOMMENDATIONS: AWAIT BIOPSY CT ABD/PELVIS.  CONSIDER SURGERY CONSULT. CEA & GASTRIN TODAY. HOLD HEPARIN .  NO ANTICOAGULATION UNTIL MASS DEFINITIVELY ADDRESSED. HOLD ASA FOR 2 WEEKS.  PLAVIX OK. CONSIDER BENEFITS V.  RISKS OF REPEAT TCS IN 6-12 MOS.  REPEAT EXAM: eSigned:  Danie Binder, MD 10-29-14 2:35 PM  CPT CODES: ICD CODES:  The ICD and CPT codes recommended by this software are interpretations from the data that the clinical staff has captured with the software.  The verification of the translation of this report to the ICD and CPT codes and modifiers is the sole responsibility of the health care institution and practicing physician where this report was generated.  [employer] [place]. will not be held responsible for the validity of the ICD and CPT codes included on this report.  AMA assumes no liability for data contained or not contained herein. CPT is a Designer, television/film set of the [employer].

## 2014-10-04 NOTE — Progress Notes (Signed)
Checked on patient at 51 he is awake hungry. Appears in good shape.

## 2014-10-04 NOTE — Progress Notes (Signed)
Subjective: He says he feels okay. He is sent for GI procedures today and he says he is hungry. No other new complaints.  Objective: Vital signs in last 24 hours: Temp:  [97.7 F (36.5 C)-98.2 F (36.8 C)] 97.7 F (36.5 C) (02/15 0630) Pulse Rate:  [72-74] 74 (02/15 0640) Resp:  [20] 20 (02/15 0630) BP: (106-126)/(34-65) 126/56 mmHg (02/15 0640) SpO2:  [97 %-99 %] 97 % (02/15 0630) Weight:  [100.6 kg (221 lb 12.5 oz)] 100.6 kg (221 lb 12.5 oz) (02/15 0630) Weight change: 0.718 kg (1 lb 9.3 oz) Last BM Date: 10/04/14  Intake/Output from previous day: 02/14 0701 - 02/15 0700 In: 885 [P.O.:720; I.V.:165] Out: -   PHYSICAL EXAM General appearance: alert, cooperative and no distress Resp: clear to auscultation bilaterally Cardio: irregularly irregular rhythm GI: soft, non-tender; bowel sounds normal; no masses,  no organomegaly Extremities: Much less edema than previously  Lab Results:  Results for orders placed or performed during the hospital encounter of 09/22/14 (from the past 48 hour(s))  Heparin level (unfractionated)     Status: None   Collection Time: 10/03/14  5:10 AM  Result Value Ref Range   Heparin Unfractionated 0.41 0.30 - 0.70 IU/mL    Comment:        IF HEPARIN RESULTS ARE BELOW EXPECTED VALUES, AND PATIENT DOSAGE HAS BEEN CONFIRMED, SUGGEST FOLLOW UP TESTING OF ANTITHROMBIN III LEVELS.   Basic metabolic panel     Status: Abnormal   Collection Time: 10/03/14  5:10 AM  Result Value Ref Range   Sodium 137 135 - 145 mmol/L   Potassium 4.3 3.5 - 5.1 mmol/L   Chloride 89 (L) 96 - 112 mmol/L   CO2 40 (HH) 19 - 32 mmol/L    Comment: RESULT REPEATED AND VERIFIED CRITICAL RESULT CALLED TO, READ BACK BY AND VERIFIED WITH: C.THOMAS AT 9563 ON 10/03/14 BY S.VANHOORNE    Glucose, Bld 130 (H) 70 - 99 mg/dL   BUN 89 (H) 6 - 23 mg/dL   Creatinine, Ser 2.44 (H) 0.50 - 1.35 mg/dL   Calcium 8.7 8.4 - 10.5 mg/dL   GFR calc non Af Amer 23 (L) >90 mL/min   GFR calc Af  Amer 27 (L) >90 mL/min    Comment: (NOTE) The eGFR has been calculated using the CKD EPI equation. This calculation has not been validated in all clinical situations. eGFR's persistently <90 mL/min signify possible Chronic Kidney Disease.    Anion gap 8 5 - 15  Blood gas, arterial     Status: Abnormal   Collection Time: 10/03/14  6:50 AM  Result Value Ref Range   O2 Content 4.5 L/min   Delivery systems NASAL CANNULA    pH, Arterial 7.486 (H) 7.350 - 7.450   pCO2 arterial 52.9 (H) 35.0 - 45.0 mmHg   pO2, Arterial 57.9 (L) 80.0 - 100.0 mmHg   Bicarbonate 39.5 (H) 20.0 - 24.0 mEq/L   TCO2 37.1 0 - 100 mmol/L   Acid-Base Excess 14.9 (H) 0.0 - 2.0 mmol/L   O2 Saturation 91.5 %   Patient temperature 37.0    Collection site LEFT BRACHIAL    Drawn by COLLECTED BY RT    Sample type ARTERIAL    Allens test (pass/fail) NOT INDICATED (A) PASS  CBC     Status: Abnormal   Collection Time: 10/03/14  9:46 AM  Result Value Ref Range   WBC 7.0 4.0 - 10.5 K/uL   RBC 3.04 (L) 4.22 - 5.81 MIL/uL  Hemoglobin 8.4 (L) 13.0 - 17.0 g/dL   HCT 28.3 (L) 39.0 - 52.0 %   MCV 93.1 78.0 - 100.0 fL   MCH 27.6 26.0 - 34.0 pg   MCHC 29.7 (L) 30.0 - 36.0 g/dL   RDW 18.7 (H) 11.5 - 15.5 %   Platelets 220 150 - 400 K/uL  Heparin level (unfractionated)     Status: None   Collection Time: 10/04/14  5:19 AM  Result Value Ref Range   Heparin Unfractionated 0.49 0.30 - 0.70 IU/mL    Comment:        IF HEPARIN RESULTS ARE BELOW EXPECTED VALUES, AND PATIENT DOSAGE HAS BEEN CONFIRMED, SUGGEST FOLLOW UP TESTING OF ANTITHROMBIN III LEVELS.   Basic metabolic panel     Status: Abnormal   Collection Time: 10/04/14  5:19 AM  Result Value Ref Range   Sodium 138 135 - 145 mmol/L   Potassium 4.4 3.5 - 5.1 mmol/L   Chloride 88 (L) 96 - 112 mmol/L   CO2 39 (H) 19 - 32 mmol/L   Glucose, Bld 124 (H) 70 - 99 mg/dL   BUN 90 (H) 6 - 23 mg/dL   Creatinine, Ser 2.38 (H) 0.50 - 1.35 mg/dL   Calcium 8.8 8.4 - 10.5 mg/dL    GFR calc non Af Amer 24 (L) >90 mL/min   GFR calc Af Amer 28 (L) >90 mL/min    Comment: (NOTE) The eGFR has been calculated using the CKD EPI equation. This calculation has not been validated in all clinical situations. eGFR's persistently <90 mL/min signify possible Chronic Kidney Disease.    Anion gap 11 5 - 15  CBC     Status: Abnormal   Collection Time: 10/04/14  5:19 AM  Result Value Ref Range   WBC 6.7 4.0 - 10.5 K/uL   RBC 2.72 (L) 4.22 - 5.81 MIL/uL   Hemoglobin 7.6 (L) 13.0 - 17.0 g/dL   HCT 25.5 (L) 39.0 - 52.0 %   MCV 93.8 78.0 - 100.0 fL   MCH 27.9 26.0 - 34.0 pg   MCHC 29.8 (L) 30.0 - 36.0 g/dL   RDW 18.9 (H) 11.5 - 15.5 %   Platelets 245 150 - 400 K/uL    ABGS  Recent Labs  10/03/14 0650  PHART 7.486*  PO2ART 57.9*  TCO2 37.1  HCO3 39.5*   CULTURES Recent Results (from the past 240 hour(s))  Clostridium Difficile by PCR     Status: None   Collection Time: 09/30/14  1:55 PM  Result Value Ref Range Status   C difficile by pcr NEGATIVE NEGATIVE Final   Studies/Results: No results found.  Medications:  Prior to Admission:  Prescriptions prior to admission  Medication Sig Dispense Refill Last Dose  . atorvastatin (LIPITOR) 20 MG tablet Take 20 mg by mouth daily.   09/21/2014 at Unknown time  . levothyroxine (SYNTHROID, LEVOTHROID) 125 MCG tablet Take 125 mcg by mouth daily before breakfast.   09/21/2014 at Unknown time  . Multiple Vitamins-Minerals (CENTRUM SILVER PO) Take 1 tablet by mouth daily.   09/21/2014 at Unknown time  . aspirin EC 81 MG tablet Take 81 mg by mouth every other day.    09/20/2014  . carvedilol (COREG) 3.125 MG tablet Take 1 tablet (3.125 mg total) by mouth 2 (two) times daily with a meal. 60 tablet 12 Taking   Scheduled: . antiseptic oral rinse  7 mL Mouth Rinse BID  . atorvastatin  20 mg Oral q1800  . carvedilol  3.125 mg Oral BID WC  . feeding supplement (ENSURE COMPLETE)  237 mL Oral BID BM  . levothyroxine  125 mcg Oral QAC  breakfast  . pantoprazole  40 mg Oral Daily  . [START ON 10/06/2014] polyethylene glycol  17 g Oral Daily  . sodium chloride  2 spray Each Nare QID  . [START ON 10/05/2014] torsemide  60 mg Oral Daily   Continuous:  LTJ:[id number] chloride, acetaminophen, ondansetron (ZOFRAN) IV, sodium chloride  Assesment: He was admitted after a fall. He was found to have atrial flutter/fibrillation with rapid ventricular response. He has much worsened cardiomyopathy with his ejection fraction now in the 20-25% range versus 40-45% a year ago. He has anemia which is both of chronic disease and from GI bleeding. He is going to have GI evaluation today Principal Problem:   Acute combined systolic and diastolic congestive heart failure Active Problems:   Anemia of chronic disease   Chronic renal insufficiency, stage III (moderate)   Abnormal EKG- known IVCD (LBBB type)   Fall   Acute respiratory failure with hypoxia   Atrial fibrillation/ atrilal flutter- new   Elevated troponin   Acute on chronic renal insufficiency   Hyperkalemia   Elevated LFTs   Type 2 diabetes mellitus with renal manifestations   Cardiomyopathy-etiology undetermined- EF 40-45% by echo Jan 2015   Anemia due to GI blood loss   Protein-calorie malnutrition, severe    Plan: For GI evaluation. At that point I think we can look at trying to get him on anticoagulation by mouth.    LOS: 12 days   Greg [name] L 10/04/2014, 9:58 AM

## 2014-10-04 NOTE — Progress Notes (Signed)
Subjective: Interval History: Patient presently offers no complaints. His appetite is good and his cough is better.  Objective: Vital signs in last 24 hours: Temp:  [97.7 F (36.5 C)-98.2 F (36.8 C)] 97.7 F (36.5 C) (02/15 0630) Pulse Rate:  [72-74] 74 (02/15 0640) Resp:  [20] 20 (02/15 0630) BP: (106-126)/(34-65) 126/56 mmHg (02/15 0640) SpO2:  [97 %-99 %] 97 % (02/15 0630) Weight:  [100.6 kg (221 lb 12.5 oz)] 100.6 kg (221 lb 12.5 oz) (02/15 0630) Weight change: 0.718 kg (1 lb 9.3 oz)  Intake/Output from previous day: 02/14 0701 - 02/15 0700 In: 885 [P.O.:720; I.V.:165] Out: -  Intake/Output this shift:    Generally: Patient is alert and in no apparent distress. Chest decreased breath sound bilaterally other wise clear Heart exam revealed iregular rate and rhythm no murmur Extremities: He has trace to  edema  Lab Results:  Recent Labs  10/03/14 0946 10/04/14 0519  WBC 7.0 6.7  HGB 8.4* 7.6*  HCT 28.3* 25.5*  PLT 220 245   BMET:   Recent Labs  10/03/14 0510 10/04/14 0519  NA 137 138  K 4.3 4.4  CL 89* 88*  CO2 40* 39*  GLUCOSE 130* 124*  BUN 89* 90*  CREATININE 2.44* 2.38*  CALCIUM 8.7 8.8   No results for input(s): PTH in the last 72 hours. Iron Studies:  No results for input(s): IRON, TIBC, TRANSFERRIN, FERRITIN in the last 72 hours.  Studies/Results: No results found.  I have reviewed the patient's current medications.  Assessment/Plan: Problem #1 CHF. Presently he is on oral Demadex. Patient remains nonoliguric. Presently he is feeling much better. Problem #2 acute on chronic renal failure. Presently his BUN and creatinine continue to improve very slowly. problem #3 hyperkalemia his potassium has corrected Problem #4 anemia: Patient with history of GI bleeding. His hemoglobin and hematocrit continued to decline. Problem #5 type 2 diabetes Problem #6 elevated troponin: Patient presently denies any chest pain. Problem #7 atrial fibrillation:  His heart rate is controlled. Problem #8 elevated LFTs  Problem #9  DVT: Patient on heparin. Plan: We'll continue his present management. We'll check his basic metabolic panel in the morning.     LOS: 12 days   Nesa Distel S 10/04/2014,8:49 AM

## 2014-10-04 NOTE — H&P (View-Only) (Signed)
Patient ID: Greg [name], male   DOB: [birth year], 81 y.o.   MRN: [id number]   Assessment/Plan: Admitted with anemia/demand ischemia and fluid overload/CHF. Clinically improved.  PLAN: 1. FULL LIQUID DIET  2. TODAY-MIRALAX PREP Q1H X3 AND THEN REPEAT ON AROUND 8 PM FEB 14. 3. DULCOLAX 10MG  x2 TODAY 4. MONITOR FOR FLUID OVERLOAD. GENTLE HYDRATION AFTER MN. 5. ONE TAP WATER ENEMA FEB 15. EGD/TCS WITH FENTANYL ON FEB 15. 6. DISCUSSED WITH PHARMACY. AGREE HOLD HEPARIN AT 0700 FOR TCS/EGD AT NOON.   Subjective: Since I last evaluated the patient he has A PAD UNDER HIS LEFT HEEL. HIS NOSE WAS CLEANED OUT. No questions or concerns. HEPARING GTT INFUSING.  Objective: Vital signs in last 24 hours: Filed Vitals:   10/03/14 0739  BP: 119/54  Pulse: 67  Temp:   Resp:    General appearance: alert, cooperative and no distress Resp: clear to auscultation bilaterally Cardio: regular rate and rhythm GI: soft, non-tender; bowel sounds normal; no masses,  no organomegaly Extremities: edema TRACE BILATERAL LEs  Lab Results:  Hb 8.4 Cr 2.44 K 4.3  Studies/Results: No results found.  Medications: I have reviewed the patient's current medications.   LOS: 5 days   [name] 01/28/2014, 2:23 PM

## 2014-10-04 NOTE — Progress Notes (Signed)
Notified MD of patient not being on heparin drip due to procedure and questioned if we needed to resume heparin drip. Based on GI's previous note MD ordered to hold off on resuming heparin drip at this time and will let GI and primary MD decide in the AM. Pharmacy was also notified and agreed. Will continue to monitor patient at this time.

## 2014-10-04 NOTE — Interval H&P Note (Signed)
History and Physical Interval Note:  10/04/2014 12:34 PM  Greg [name]  has presented today for surgery, with the diagnosis of ANEMIA  The various methods of treatment have been discussed with the patient and family. After consideration of risks, benefits and other options for treatment, the patient has consented to  Procedure(s): COLONOSCOPY (N/A) ESOPHAGOGASTRODUODENOSCOPY (EGD) (N/A) as a surgical intervention .  The patient's history has been reviewed, patient examined, no change in status, stable for surgery.  I have reviewed the patient's chart and labs.  Questions were answered to the patient's satisfaction.     [employer]

## 2014-10-04 NOTE — Op Note (Addendum)
[facility] [address] [place], [address]   COLONOSCOPY PROCEDURE REPORT  PATIENT: Fabrizzio, Marcella  MR#: [id number] BIRTHDATE: 1932-04-09 , 74  yrs. old GENDER: male ENDOSCOPIST: Danie Binder, MD REFERRED SR:PRXYVO Luan Pulling, M.D.  Aviva Signs, M.D.  Garfield Cornea, M.D. PROCEDURE DATE:  11-02-14 PROCEDURE:   Colonoscopy with biopsy and Submucosal injection,(SPOT TATTOO) INDICATIONS:hematochezia. MEDICATIONS: Fentanyl 25 mcg IV and Versed 3 mg IV  DESCRIPTION OF PROCEDURE:    Physical exam was performed.  Informed consent was obtained from the patient after explaining the benefits, risks, and alternatives to procedure.  The patient was connected to monitor and placed in left lateral position. Continuous oxygen was provided by nasal cannula and IV medicine administered through an indwelling cannula.  After administration of sedation and rectal exam, the patients rectum was intubated and the EC-[id number] ([id number])  colonoscope was advanced under direct visualization to the RIGHT COLON  The scope was removed slowly by carefully examining the color, texture, anatomy, and integrity mucosa on the way out.  The patient was recovered in endoscopy and discharged home in satisfactory condition.    COLON FINDINGS: A near circumferential firm mass with friable surfaces was found MOST LIKEY AT THE hepatic flexure.  Multiple biopsies were performed using cold forceps.  , SPOT tattoo PLACED AT distal aspect of mass.  Unable to pass sope beyond mass, There was severe diverticulosis noted throughout the entire examined colon with associated tortuosity and muscular hypertrophy.  , The colon was redundant.  Manual abdominal counter-pressure was used to reach the RIGHT COLON.  The patient was moved on to their back to reach the cRIGHT COLON. and Moderate sized internal hemorrhoids were found.  PREP QUALITY: fair-POLYPS < 1 CM WOULD HAVE BEEN MISSED CECAL W/D TIME: COULD NOT  BE CALCULATED          COMPLICATIONS: None  ENDOSCOPIC IMPRESSION: 1.   RECTAL BLEEDING/ANEMIA DUE TO Mass MOST LIKELY at the hepatic flexure AND HEMORRHOIDS 2.   SPOT tattoo PLACED AT distal aspect of mass. 3.   Severe diverticulosis throughout the entire examined colon 4.   The LEFT colon IS redundant 5.   Moderate sized internal hemorrhoids  RECOMMENDATIONS: AWAIT BIOPSY CT ABD/PELVIS. CONSIDER SURGERY CONSULT. CEA & GASTRIN TODAY. CONSIDER BENEFITS V. RISKS OF REPEAT TCS IN 6-12 MOS.  eSigned:  Danie Binder, MD 11-02-14 2:10 PM ed  CPT CODES: ICD CODES:  The ICD and CPT codes recommended by this software are interpretations from the data that the clinical staff has captured with the software.  The verification of the translation of this report to the ICD and CPT codes and modifiers is the sole responsibility of the health care institution and practicing physician where this report was generated.  [place]. will not be held responsible for the validity of the ICD and CPT codes included on this report.  AMA assumes no liability for data contained or not contained herein. CPT is a Designer, television/film set of the [employer].

## 2014-10-04 NOTE — Progress Notes (Signed)
Received report from [place], RN in ENDO. Patient arrived back to the floor and was very drowsy but was aroused by voice and touch. Patients VS were within normal limits. Patient had no complaints. Bed was at lowest level and call bell was within reach. Will continue to monitor patient at this time.

## 2014-10-05 ENCOUNTER — Inpatient Hospital Stay (HOSPITAL_COMMUNITY): Payer: Medicare Other

## 2014-10-05 ENCOUNTER — Encounter (HOSPITAL_COMMUNITY): Payer: Self-pay | Admitting: Gastroenterology

## 2014-10-05 DIAGNOSIS — K6389 Other specified diseases of intestine: Secondary | ICD-10-CM | POA: Diagnosis present

## 2014-10-05 DIAGNOSIS — K279 Peptic ulcer, site unspecified, unspecified as acute or chronic, without hemorrhage or perforation: Secondary | ICD-10-CM | POA: Diagnosis present

## 2014-10-05 LAB — CBC
HCT: 27.9 % — ABNORMAL LOW (ref 39.0–52.0)
Hemoglobin: 8.2 g/dL — ABNORMAL LOW (ref 13.0–17.0)
MCH: 27.8 pg (ref 26.0–34.0)
MCHC: 29.4 g/dL — AB (ref 30.0–36.0)
MCV: 94.6 fL (ref 78.0–100.0)
Platelets: 271 10*3/uL (ref 150–400)
RBC: 2.95 MIL/uL — ABNORMAL LOW (ref 4.22–5.81)
RDW: 18.8 % — ABNORMAL HIGH (ref 11.5–15.5)
WBC: 7.5 10*3/uL (ref 4.0–10.5)

## 2014-10-05 LAB — BASIC METABOLIC PANEL
ANION GAP: 10 (ref 5–15)
BUN: 83 mg/dL — AB (ref 6–23)
CALCIUM: 8.9 mg/dL (ref 8.4–10.5)
CHLORIDE: 90 mmol/L — AB (ref 96–112)
CO2: 37 mmol/L — ABNORMAL HIGH (ref 19–32)
CREATININE: 2.38 mg/dL — AB (ref 0.50–1.35)
GFR calc Af Amer: 28 mL/min — ABNORMAL LOW (ref >90)
GFR calc non Af Amer: 24 mL/min — ABNORMAL LOW (ref >90)
Glucose, Bld: 134 mg/dL — ABNORMAL HIGH (ref 70–99)
Potassium: 4.1 mmol/L (ref 3.5–5.1)
SODIUM: 137 mmol/L (ref 135–145)

## 2014-10-05 LAB — HEPARIN LEVEL (UNFRACTIONATED): Heparin Unfractionated: 0.27 IU/mL — ABNORMAL LOW (ref 0.30–0.70)

## 2014-10-05 MED ORDER — HEPARIN (PORCINE) IN NACL 100-0.45 UNIT/ML-% IJ SOLN
1600.0000 [IU]/h | INTRAMUSCULAR | Status: DC
  Administered 2014-10-05: 1500 [IU]/h via INTRAVENOUS
  Administered 2014-10-06 (×2): 1600 [IU]/h via INTRAVENOUS
  Filled 2014-10-05 (×6): qty 250

## 2014-10-05 NOTE — Consult Note (Signed)
Reason for Consult:colon mass Referring Physician: Dr. Debbe Odea Greg [name] is an 81 y.o. male.  HPI:   81 year old man with history of transfusion dependent anemia of unclear etiology, combined systolic/diastolic dysfunction, diabetes mellitus presented to the emergency department after a fall at home with difficulty getting up afterwards. Also noted 2 week history of shortness of breath. Initial evaluation revealed acute hypoxic respiratory failure, elevated troponin, new diagnosis atrial fibrillation, acute on chronic renal failure, hyperkalemia and he was referred for admission.  He was worked up in the hospital for heme +stool.  A colonoscopy was done which showed a large near obstructing mass near the hepatic flexure, per Dr. Oneida Alar' notes.  Pt was on dobutamine gtt, and is now off, and currently on heparin gtt for UE DVT. Pt has been evaluated by Dr. Johnsie Cancel and states he is a high risk for surgical procedure.  Past Medical History  Diagnosis Date  . Type 2 diabetes mellitus   . Anemia of chronic disease   . CKD (chronic kidney disease) stage 3, GFR 30-59 ml/min   . Herpes zoster   . Obesity   . Exposure to hazardous substance   . Intermittent Leukopenia   . Essential hypertension   . Sleep apnea     Sleep Apnea score of 7  . Cardiomyopathy     LVEF 40-45% January 2015  . Left bundle branch block     Past Surgical History  Procedure Laterality Date  . Nasal polyp surgery    . Ventral hernia repair N/A 09/14/2013    Procedure: HERNIA REPAIR VENTRAL ADULT WITH MESH;  Surgeon: Jamesetta So, MD;  Location: [facility] ORS;  Service: General;  Laterality: N/A;  . Insertion of mesh N/A 09/14/2013    Procedure: INSERTION OF MESH;  Surgeon: Jamesetta So, MD;  Location: [facility]S;  Service: General;  Laterality: N/A;  . Colonoscopy N/A 10/04/2014    Procedure: COLONOSCOPY;  Surgeon: Danie Binder, MD;  Location: [facility] END[facility] SUITE;  Service: Endoscopy;  Laterality: N/A;  .  Esophagogastroduodenoscopy N/A 10/04/2014    Procedure: ESOPHAGOGASTRODUODENOSCOPY (EGD);  Surgeon: Danie Binder, MD;  Location: [facility] SUITE;  Service: Endoscopy;  Laterality: N/A;    Family History  Problem Relation Age of Onset  . Heart attack Brother     CABG  . Diabetes Brother   . Stroke Father   . Heart Problems Mother   . Colon cancer Neg Hx     Social History:  reports that he has quit smoking. His smoking use included Cigars. His smokeless tobacco use includes Chew. He reports that he does not drink alcohol or use illicit drugs.  Allergies:  Allergies  Allergen Reactions  . Celery Oil     vomiting    Medications: I have reviewed the patient's current medications.  Results for orders placed or performed during the hospital encounter of 09/22/14 (from the past 48 hour(s))  Heparin level (unfractionated)     Status: None   Collection Time: 10/04/14  5:19 AM  Result Value Ref Range   Heparin Unfractionated 0.49 0.30 - 0.70 IU/mL    Comment:        IF HEPARIN RESULTS ARE BELOW EXPECTED VALUES, AND PATIENT DOSAGE HAS BEEN CONFIRMED, SUGGEST FOLLOW UP TESTING OF ANTITHROMBIN III LEVELS.   Basic metabolic panel     Status: Abnormal   Collection Time: 10/04/14  5:19 AM  Result Value Ref Range   Sodium 138 135 - 145 mmol/L  Potassium 4.4 3.5 - 5.1 mmol/L   Chloride 88 (L) 96 - 112 mmol/L   CO2 39 (H) 19 - 32 mmol/L   Glucose, Bld 124 (H) 70 - 99 mg/dL   BUN 90 (H) 6 - 23 mg/dL   Creatinine, Ser 2.38 (H) 0.50 - 1.35 mg/dL   Calcium 8.8 8.4 - 10.5 mg/dL   GFR calc non Af Amer 24 (L) >90 mL/min   GFR calc Af Amer 28 (L) >90 mL/min    Comment: (NOTE) The eGFR has been calculated using the CKD EPI equation. This calculation has not been validated in all clinical situations. eGFR's persistently <90 mL/min signify possible Chronic Kidney Disease.    Anion gap 11 5 - 15  CBC     Status: Abnormal   Collection Time: 10/04/14  5:19 AM  Result Value Ref Range   WBC  6.7 4.0 - 10.5 K/uL   RBC 2.72 (L) 4.22 - 5.81 MIL/uL   Hemoglobin 7.6 (L) 13.0 - 17.0 g/dL   HCT 25.5 (L) 39.0 - 52.0 %   MCV 93.8 78.0 - 100.0 fL   MCH 27.9 26.0 - 34.0 pg   MCHC 29.8 (L) 30.0 - 36.0 g/dL   RDW 18.9 (H) 11.5 - 15.5 %   Platelets 245 150 - 400 K/uL  CBC     Status: Abnormal   Collection Time: 10/05/14  5:14 AM  Result Value Ref Range   WBC 7.5 4.0 - 10.5 K/uL   RBC 2.95 (L) 4.22 - 5.81 MIL/uL   Hemoglobin 8.2 (L) 13.0 - 17.0 g/dL   HCT 27.9 (L) 39.0 - 52.0 %   MCV 94.6 78.0 - 100.0 fL   MCH 27.8 26.0 - 34.0 pg   MCHC 29.4 (L) 30.0 - 36.0 g/dL   RDW 18.8 (H) 11.5 - 15.5 %   Platelets 271 150 - 400 K/uL  Basic metabolic panel     Status: Abnormal   Collection Time: 10/05/14  5:14 AM  Result Value Ref Range   Sodium 137 135 - 145 mmol/L   Potassium 4.1 3.5 - 5.1 mmol/L   Chloride 90 (L) 96 - 112 mmol/L   CO2 37 (H) 19 - 32 mmol/L   Glucose, Bld 134 (H) 70 - 99 mg/dL   BUN 83 (H) 6 - 23 mg/dL   Creatinine, Ser 2.38 (H) 0.50 - 1.35 mg/dL   Calcium 8.9 8.4 - 10.5 mg/dL   GFR calc non Af Amer 24 (L) >90 mL/min   GFR calc Af Amer 28 (L) >90 mL/min    Comment: (NOTE) The eGFR has been calculated using the CKD EPI equation. This calculation has not been validated in all clinical situations. eGFR's persistently <90 mL/min signify possible Chronic Kidney Disease.    Anion gap 10 5 - 15    No results found.  Review of Systems  Constitutional: Negative for weight loss.  HENT: Negative for ear discharge, ear pain, hearing loss and tinnitus.   Eyes: Negative for blurred vision, double vision, photophobia and pain.  Respiratory: Negative for cough, sputum production and shortness of breath.   Cardiovascular: Negative for chest pain.  Gastrointestinal: Negative for nausea, vomiting and abdominal pain.  Genitourinary: Negative for dysuria, urgency, frequency and flank pain.  Musculoskeletal: Negative for myalgias, back pain, joint pain, falls and neck pain.   Neurological: Negative for dizziness, tingling, sensory change, focal weakness, loss of consciousness and headaches.  Endo/Heme/Allergies: Does not bruise/bleed easily.  Psychiatric/Behavioral: Negative for depression, memory loss and  substance abuse. The patient is not nervous/anxious.    Blood pressure 112/42, pulse 58, temperature 98.6 F (37 C), temperature source Oral, resp. rate 20, height _0  (1.803 m), weight 214 lb 14.4 oz (97.478 kg), SpO2 98 %. Physical Exam  Vitals reviewed. Constitutional: He is oriented to person, place, and time. He appears well-developed and well-nourished. He is cooperative. No distress. Cervical collar and nasal cannula in place.  HENT:  Head: Normocephalic and atraumatic. Head is without raccoon's eyes, without Battle's sign, without abrasion, without contusion and without laceration.  Right Ear: Hearing, tympanic membrane, external ear and ear canal normal. No lacerations. No drainage or tenderness. No foreign bodies. Tympanic membrane is not perforated. No hemotympanum.  Left Ear: Hearing, tympanic membrane, external ear and ear canal normal. No lacerations. No drainage or tenderness. No foreign bodies. Tympanic membrane is not perforated. No hemotympanum.  Nose: Nose normal. No nose lacerations, sinus tenderness, nasal deformity or nasal septal hematoma. No epistaxis.  Mouth/Throat: Uvula is midline, oropharynx is clear and moist and mucous membranes are normal. No lacerations.  Eyes: Conjunctivae, EOM and lids are normal. Pupils are equal, round, and reactive to light. No scleral icterus.  Neck: Trachea normal. No JVD present. No spinous process tenderness and no muscular tenderness present. Carotid bruit is not present. No thyromegaly present.  Cardiovascular: Normal rate, regular rhythm, normal heart sounds, intact distal pulses and normal pulses.   Respiratory: Effort normal and breath sounds normal. No respiratory distress. He exhibits no tenderness,  no bony tenderness, no laceration and no crepitus.  GI: Soft. Normal appearance. He exhibits no distension. Bowel sounds are decreased. There is no tenderness. There is no rigidity, no rebound, no guarding and no CVA tenderness.  Musculoskeletal: Normal range of motion. He exhibits no edema or tenderness.  Lymphadenopathy:    He has no cervical adenopathy.  Neurological: He is alert and oriented to person, place, and time. He has normal strength. No cranial nerve deficit or sensory deficit. GCS eye subscore is 4. GCS verbal subscore is 5. GCS motor subscore is 6.  Skin: Skin is warm, dry and intact. He is not diaphoretic.  Psychiatric: He has a normal mood and affect. His speech is normal and behavior is normal.    Assessment/Plan: 80 yo M with near obstructing colonic mass.  This is complicated by UE DVT, poor EF~25% and chronic heart conditions.  Due to the high cardiac risk I do believe that the patient would be best served at [facility] for supportive services.  I did d/w Dr. Luan Pulling.  Thank you for the consult.    Rosario Jacks., Cieanna Stormes 10/05/2014, 3:08 PM

## 2014-10-05 NOTE — Progress Notes (Signed)
The results of his colonoscopy are noted.  he may require surgery. I will discuss with his family about getting that done. He may have to be transferred to [facility].

## 2014-10-05 NOTE — Care Management Utilization Note (Signed)
UR completed 

## 2014-10-05 NOTE — Progress Notes (Signed)
[place] for Heparin (resumed after colonoscopy) Indication: DVT  Allergies  Allergen Reactions  . Celery Oil     vomiting   Patient Measurements: Height: 5\' 11"  (180.3 cm) Weight: 214 lb 14.4 oz (97.478 kg) IBW/kg (Calculated) : 75.3 Heparin Dosing Weight: 97.8 kg  Vital Signs: Temp: 98.2 F (36.8 C) (02/16 0651) Temp Source: Oral (02/16 0651) BP: 110/70 mmHg (02/16 0651) Pulse Rate: 86 (02/16 0651)  Labs:  Recent Labs  10/03/14 0510  10/03/14 0946 10/04/14 0519 10/05/14 0514  HGB  --   < > 8.4* 7.6* 8.2*  HCT  --   --  28.3* 25.5* 27.9*  PLT  --   --  220 245 271  HEPARINUNFRC 0.41  --   --  0.49  --   CREATININE 2.44*  --   --  2.38* 2.38*  < > = values in this interval not displayed. Estimated Creatinine Clearance: 29.5 mL/min (by C-G formula based on Cr of 2.38).  Medical History: Past Medical History  Diagnosis Date  . Type 2 diabetes mellitus   . Anemia of chronic disease   . CKD (chronic kidney disease) stage 3, GFR 30-59 ml/min   . Herpes zoster   . Obesity   . Exposure to hazardous substance   . Intermittent Leukopenia   . Essential hypertension   . Sleep apnea     Sleep Apnea score of 7  . Cardiomyopathy     LVEF 40-45% January 2015  . Left bundle branch block    Medications:  Scheduled:  . antiseptic oral rinse  7 mL Mouth Rinse BID  . atorvastatin  20 mg Oral q1800  . carvedilol  3.125 mg Oral BID WC  . feeding supplement (ENSURE COMPLETE)  237 mL Oral BID BM  . levothyroxine  125 mcg Oral QAC breakfast  . pantoprazole  40 mg Oral BID AC  . [START ON 10/06/2014] polyethylene glycol  17 g Oral Daily  . sodium chloride  2 spray Each Nare QID  . torsemide  60 mg Oral Daily   Assessment: [name] admitted with new onset Afib & transfusion dependent anemia, FOBT x3 as outpatient.  Anticoagulation was initially held pending GI work-up.  Venous doppler ultrasound now positive for right upper extremity  axillary and brachial acute occlusive DVT and pharmacy asked to initiate heparin.  No overt bleeding noted.  Pt is s/p colonoscopy.  Asked to resume Heparin drip with no bolus per cardiology (as noted below).  Plan/Discussion:    Apparently has nearly obstructing colon cancer. Surgery will need to be done High risk for post op complications specifically CHF and arrhythmia Would agree with transfer to [facility] for surgery Patient to discuss with wife. Flutter rate well controlled and good diuresis with no active CHF Have written to resume heparin with no bolus due to axillary DVT and afib/flutter. Will need oral Anticoagulation post op   Jenkins Rouge      Goal of Therapy:  Heparin level 0.3-0.7 units/ml Monitor platelets by anticoagulation protocol: Yes   Plan:  Resume heparin infusion at 1500 units/hr (no bolus) Check heparin level in 6-8 hrs Daily heparin level & CBC while on heparin F/U plan for long-term anticoagulation once ok with GI / cardiology  Hart Robinsons A 10/05/2014,9:36 AM

## 2014-10-05 NOTE — Progress Notes (Addendum)
Patient ID: Greg [name], male   DOB: 1932-07-12, 81 y.o.   MRN: [id number]    Primary cardiologist: Dr. Carlyle Dolly  Seen for followup: Atrial arrhythmia, acute on chronic systolic heart failure  Subjective:    For EGD this am   Objective:   Temp:  [97.6 F (36.4 C)-98.2 F (36.8 C)] 98.2 F (36.8 C) (02/16 0651) Pulse Rate:  [26-109] 86 (02/16 0651) Resp:  [12-24] 20 (02/16 0651) BP: (98-128)/(49-88) 110/70 mmHg (02/16 0651) SpO2:  [85 %-100 %] 97 % (02/16 0651) Weight:  [97.478 kg (214 lb 14.4 oz)] 97.478 kg (214 lb 14.4 oz) (02/16 0651) Last BM Date: 10/04/14  Filed Weights   10/03/14 0627 10/04/14 0630 10/05/14 0651  Weight: 99.882 kg (220 lb 3.2 oz) 100.6 kg (221 lb 12.5 oz) 97.478 kg (214 lb 14.4 oz)    Intake/Output Summary (Last 24 hours) at 10/05/14 0847 Last data filed at 10/05/14 2130  Gross per 24 hour  Intake    240 ml  Output   2650 ml  Net  -2410 ml    Telemetry: Rate controlled atrial flutter with largely 3:1  70-80's   Exam:  General: Overweight, chronically ill-appearing, no distress.  Lungs: Diminished breath sounds at the bases.  Cardiac: Regular rate and rhythm ectopy, no S3 gallop.  Abdomen: Obese, nontender.  Extremities: Trace leg edema, stasis.  Lab Results:  Basic Metabolic Panel:  Recent Labs Lab 10/03/14 0510 10/04/14 0519 10/05/14 0514  NA 137 138 137  K 4.3 4.4 4.1  CL 89* 88* 90*  CO2 40* 39* 37*  GLUCOSE 130* 124* 134*  BUN 89* 90* 83*  CREATININE 2.44* 2.38* 2.38*  CALCIUM 8.7 8.8 8.9    Liver Function Tests:  Recent Labs Lab 10/02/14 0550  AST 34  ALT 58*  ALKPHOS 48  BILITOT 0.6  PROT 5.9*  ALBUMIN 2.8*    CBC:  Recent Labs Lab 10/03/14 0946 10/04/14 0519 10/05/14 0514  WBC 7.0 6.7 7.5  HGB 8.4* 7.6* 8.2*  HCT 28.3* 25.5* 27.9*  MCV 93.1 93.8 94.6  PLT 220 245 271    Echocardiogram 09/23/14: Study Conclusions  - Procedure narrative: Transthoracic echocardiography. Image quality  was adequate. The study was technically difficult, as a result of poor patient compliance. - Left ventricle: The cavity size was normal. Systolic function was severely reduced. The estimated ejection fraction was in the range of 20% to 25%. The study was not technically sufficient to allow evaluation of LV diastolic dysfunction due to atrial fibrillation. Doppler parameters are consistent with high ventricular filling pressure. Mild posterior wall with severe asymmetric septal hypertrophy (2.16 cm). - Ventricular septum: Septal motion showed abnormal function and dyssynergy. These changes are consistent with a left bundle branch block. - Aortic valve: Mildly calcified annulus. Trileaflet; mildly thickened leaflets. There was trivial regurgitation. - Mitral valve: Mildly thickened annulus. Mildly thickened leaflets . There was mild regurgitation. - Left atrium: The atrium was severely dilated. Volume/bsa, ES, (1-plane Simpson&'s, A2C): 42.6 ml/m^2. - Right ventricle: The cavity size was mildly dilated. Systolic function was mildly reduced. - Right atrium: The atrium was mildly to moderately dilated. - Tricuspid valve: There was mild regurgitation. - Pulmonary arteries: PA peak pressure: 50 mm Hg (S). Moderately elevated pulmonary pressures. - Systemic veins: IVC is dilated with normal respiratory variation. Estimated CVP 8 mmHg.   Medications:   Scheduled Medications: . antiseptic oral rinse  7 mL Mouth Rinse BID  . atorvastatin  20 mg Oral q1800  .  carvedilol  3.125 mg Oral BID WC  . feeding supplement (ENSURE COMPLETE)  237 mL Oral BID BM  . levothyroxine  125 mcg Oral QAC breakfast  . pantoprazole  40 mg Oral BID AC  . [START ON 10/06/2014] polyethylene glycol  17 g Oral Daily  . sodium chloride  2 spray Each Nare QID  . torsemide  60 mg Oral Daily    Infusions:    PRN Medications: sodium chloride, acetaminophen, ondansetron (ZOFRAN) IV,  sodium chloride   Assessment:   1. Acute on chronic systolic heart failure, LVEF in the range of 20-25% by follow-up echocardiogram. Dobutamine infusion now discontinued after substantial diuresis and improvement in renal function. Creatinine stable at 2.38     2. Atrial flutter with controlled ventricular response. CHADSVASC score is 6. Suboptimal candidate for long-term anticoagulation, but will continue to reconsider depending on clinical course and workup for possible GI bleed.  3. Right upper extremity DVT, currently on IV heparin. He has had no obvious bleeding with this intervention. Will still need to be on an oral anticoagulant at least temporarily for this diagnosis.  4. Acute on chronic renal failure, creatinine has stabilized around 2.38. Nephrology following.  5. Anemia with history of GI bleed. Hct continues to fall.  Cannot consider oral anticoagulation until bleeding source identified    Plan/Discussion:    Apparently has nearly obstructing colon cancer.  Surgery will need to be done High risk for post op complications specifically CHF and arrhythmia   Would agree with transfer to [facility] for surgery Patient to discuss with wife.  Flutter rate well controlled and good diuresis with no active CHF  Have written to resume heparin with no bolus due to axillary DVT and afib/flutter.  Will need oral Anticoagulation post op   Jenkins Rouge

## 2014-10-05 NOTE — Progress Notes (Signed)
Greg [name]  MRN: [id number]  DOB/AGE: [birth year] 81 y.o.  Primary Care Physician:HAWKINS,EDWARD L, MD  Admit date: 09/22/2014  Chief Complaint:  Chief Complaint  Patient presents with  . Fall    S-Pt presented on  09/22/2014 with  Chief Complaint  Patient presents with  . Fall  .    Pt today feels better.    Pt offers no new complaints.  Meds . antiseptic oral rinse  7 mL Mouth Rinse BID  . atorvastatin  20 mg Oral q1800  . carvedilol  3.125 mg Oral BID WC  . feeding supplement (ENSURE COMPLETE)  237 mL Oral BID BM  . levothyroxine  125 mcg Oral QAC breakfast  . pantoprazole  40 mg Oral BID AC  . [START ON 10/06/2014] polyethylene glycol  17 g Oral Daily  . sodium chloride  2 spray Each Nare QID  . torsemide  60 mg Oral Daily       Physical Exam: Vital signs in last 24 hours: Temp:  [97.6 F (36.4 C)-98.6 F (37 C)] 98.6 F (37 C) (02/16 1404) Pulse Rate:  [58-100] 58 (02/16 1404) Resp:  [20] 20 (02/16 1404) BP: (110-128)/(42-70) 112/42 mmHg (02/16 1404) SpO2:  [96 %-100 %] 98 % (02/16 1404) Weight:  [214 lb 14.4 oz (97.478 kg)] 214 lb 14.4 oz (97.478 kg) (02/16 0651) Weight change: -6 lb 14.1 oz (-3.122 kg) Last BM Date: 10/04/14  Intake/Output from previous day: 02/15 0701 - 02/16 0700 In: 240 [P.O.:240] Out: 2650 [Urine:2650] Total I/O In: 720 [P.O.:720] Out: 650 [Urine:650]   Physical Exam: General- pt is awake,alert, oriented to time place and person Resp- No acute REsp distress,  Decreased bs at bases. CVS- S1S2 regular in rate and rhythm GIT- BS+, soft, NT. EXT- 1+ LE Edema, NO Cyanosis   Lab Results: CBC  Recent Labs  10/04/14 0519 10/05/14 0514  WBC 6.7 7.5  HGB 7.6* 8.2*  HCT 25.5* 27.9*  PLT 245 271    BMET  Recent Labs  10/04/14 0519 10/05/14 0514  NA 138 137  K 4.4 4.1  CL 88* 90*  CO2 39* 37*  GLUCOSE 124* 134*  BUN 90* 83*  CREATININE 2.38* 2.38*  CALCIUM 8.8 8.9   Creat Trend 2016   3.3=>2.95=>2.72=>2.55=>2.38-2.38 2015  2.66=>1.79=>1.70 2012  1.77 2011  1.66--2.3 2009  1.6 2008   1.45  MICRO Recent Results (from the past 240 hour(s))  Clostridium Difficile by PCR     Status: None   Collection Time: 09/30/14  1:55 PM  Result Value Ref Range Status   C difficile by pcr NEGATIVE NEGATIVE Final      Lab Results  Component Value Date   PTH 186.4* 09/04/2013   CALCIUM 8.9 10/05/2014   PHOS 3.1 09/05/2013         Impression: 1)Renal  AKI secondary to Prerenal/ATN/Cardiorenal                AKI on CKD                AKI now better                Creat at baseline               CKD stage 3.               CKD since 2008               CKD secondary to DM/ Age asso decline  Progression of CKD marked with multiple  AKI                Proteinura  Present-                    Less than 500mg    2)CHF  Was on Dobutamine + Lasix              Pt negative by 21 liters               now better  3)Anemia HGb low                GI following  4)CKD Mineral-Bone Disorder PTH elevated. Secondary Hyperparathyroidism  Present. Phosphorus at goal.   5)Arrhythmia -Atrial flutter  Primary MD and Cardiology following  6)Electrolytes Normokalemic NOrmonatremic   7)Acid base Co2 on higher side.  8) DVT - was on IV on Heparin     Held sec to bleed  Plan:  Will continue current care   [place] S 10/05/2014, 2:28 PM

## 2014-10-05 NOTE — Progress Notes (Signed)
PT Cancellation Note  Patient Details Name: Greg [name] MRN: [id number] DOB: [birth year]   Cancelled Treatment:    Reason Eval/Treat Not Completed: Patient declined.  He stated that exercise would "tear me up".  He was trying to drink contrast medium for an upcoming scan and was already tired from having eaten breakfast.  His supplemental O2 cannula was out of his nose and O2 sat was 88%.  Cannula was replaced.  [name] L 10/05/2014, 10:47 AM

## 2014-10-05 NOTE — Progress Notes (Signed)
    Subjective: Denies abdominal pain, N/V, overt GI bleeding. Tolerating diet. No dysphagia.   Objective: Vital signs in last 24 hours: Temp:  [97.6 F (36.4 C)-98.2 F (36.8 C)] 98.2 F (36.8 C) (02/16 0651) Pulse Rate:  [26-109] 86 (02/16 0651) Resp:  [12-24] 20 (02/16 0651) BP: (98-128)/(49-88) 110/70 mmHg (02/16 0651) SpO2:  [85 %-100 %] 97 % (02/16 0651) Weight:  [214 lb 14.4 oz (97.478 kg)] 214 lb 14.4 oz (97.478 kg) (02/16 0651) Last BM Date: 10/04/14 General:   Alert and oriented to person place, not time. pleasant Head:  Normocephalic and atraumatic. Eyes:  No icterus, sclera clear. Conjuctiva pink.  Abdomen:  Bowel sounds present, soft, obese, non-tender, non-distended. Difficult to appreciate HSM due to large AP diameter.  Extremities:  1-2+ pedal edema  Neurologic:  Alert and  oriented to person and place, unsure the year.  Psych:  Alert and cooperative. Normal mood and affect.  Intake/Output from previous day: 02/15 0701 - 02/16 0700 In: 240 [P.O.:240] Out: 2650 [Urine:2650] Intake/Output this shift:    Lab Results:  Recent Labs  10/03/14 0946 10/04/14 0519 10/05/14 0514  WBC 7.0 6.7 7.5  HGB 8.4* 7.6* 8.2*  HCT 28.3* 25.5* 27.9*  PLT 220 245 271   BMET  Recent Labs  10/03/14 0510 10/04/14 0519 10/05/14 0514  NA 137 138 137  K 4.3 4.4 4.1  CL 89* 88* 90*  CO2 40* 39* 37*  GLUCOSE 130* 124* 134*  BUN 89* 90* 83*  CREATININE 2.44* 2.38* 2.38*  CALCIUM 8.7 8.8 8.9    Assessment: 81 year old male with admitted with anemia and demand ischemia, with EGD and colonoscopy completed 2/15. Colonoscopy with near circumferential mass likely at hepatic flexure, EGD with PUD. Anemia secondary to PUD and right colon mass.      Plan: CEA and gastrin level Follow-up on pending biopsy PPI BID CT abd/pelvis Hold aspirin for 2 weeks, ok to resume Plavix Consider repeat colonoscopy in 6-12 months if clinically stable Further recommendations to  follow  Orvil Feil, ANP-BC [facility]       LOS: 13 days    10/05/2014, 7:54 AM

## 2014-10-05 NOTE — Progress Notes (Signed)
[place] for Heparin Indication: DVT  Allergies  Allergen Reactions  . Celery Oil     vomiting   Patient Measurements: Height: 5\' 11"  (180.3 cm) Weight: 214 lb 14.4 oz (97.478 kg) IBW/kg (Calculated) : 75.3 Heparin Dosing Weight: 97.8 kg  Vital Signs: Temp: 98.6 F (37 C) (02/16 1404) Temp Source: Oral (02/16 1404) BP: 112/42 mmHg (02/16 1404) Pulse Rate: 58 (02/16 1404)  Labs:  Recent Labs  10/03/14 0510  10/03/14 0946 10/04/14 0519 10/05/14 0514 10/05/14 1638  HGB  --   < > 8.4* 7.6* 8.2*  --   HCT  --   --  28.3* 25.5* 27.9*  --   PLT  --   --  220 245 271  --   HEPARINUNFRC 0.41  --   --  0.49  --  0.27*  CREATININE 2.44*  --   --  2.38* 2.38*  --   < > = values in this interval not displayed. Estimated Creatinine Clearance: 29.5 mL/min (by C-G formula based on Cr of 2.38).  Medical History: Past Medical History  Diagnosis Date  . Type 2 diabetes mellitus   . Anemia of chronic disease   . CKD (chronic kidney disease) stage 3, GFR 30-59 ml/min   . Herpes zoster   . Obesity   . Exposure to hazardous substance   . Intermittent Leukopenia   . Essential hypertension   . Sleep apnea     Sleep Apnea score of 7  . Cardiomyopathy     LVEF 40-45% January 2015  . Left bundle branch block    Medications:  Scheduled:  . antiseptic oral rinse  7 mL Mouth Rinse BID  . atorvastatin  20 mg Oral q1800  . carvedilol  3.125 mg Oral BID WC  . feeding supplement (ENSURE COMPLETE)  237 mL Oral BID BM  . levothyroxine  125 mcg Oral QAC breakfast  . pantoprazole  40 mg Oral BID AC  . [START ON 10/06/2014] polyethylene glycol  17 g Oral Daily  . sodium chloride  2 spray Each Nare QID  . torsemide  60 mg Oral Daily   Assessment: [name] admitted with new onset Afib & transfusion dependent anemia, FOBT x3 as outpatient.  Anticoagulation was initially held pending GI work-up.  Venous doppler ultrasound now positive for right upper  extremity axillary and brachial acute occlusive DVT and pharmacy was asked to initiate heparin.  Heparin was held for colonoscopy & resumed this morning with no bolus per cardiology.   Nearly obstructing colon CA per colonoscopy.  Plan to tx to [facility] for high risk surgery.  Initial heparin level slightly below goal range, however patient was previously therapeutic on this rate.      No bleeding noted.   Goal of Therapy:  Heparin level 0.3-0.7 units/ml Monitor platelets by anticoagulation protocol: Yes   Plan:  Increase heparin infusion to 1600 units/hr  F/U am heparin level Daily heparin level & CBC while on heparin F/U plan for long-term anticoagulation once post-op  Biagio Borg 10/05/2014,5:29 PM

## 2014-10-06 DIAGNOSIS — I82621 Acute embolism and thrombosis of deep veins of right upper extremity: Secondary | ICD-10-CM

## 2014-10-06 DIAGNOSIS — E43 Unspecified severe protein-calorie malnutrition: Secondary | ICD-10-CM

## 2014-10-06 DIAGNOSIS — I4891 Unspecified atrial fibrillation: Secondary | ICD-10-CM | POA: Insufficient documentation

## 2014-10-06 DIAGNOSIS — N179 Acute kidney failure, unspecified: Secondary | ICD-10-CM | POA: Insufficient documentation

## 2014-10-06 DIAGNOSIS — I4892 Unspecified atrial flutter: Secondary | ICD-10-CM

## 2014-10-06 LAB — CBC
HEMATOCRIT: 26 % — AB (ref 39.0–52.0)
Hemoglobin: 7.9 g/dL — ABNORMAL LOW (ref 13.0–17.0)
MCH: 28.5 pg (ref 26.0–34.0)
MCHC: 30.4 g/dL (ref 30.0–36.0)
MCV: 93.9 fL (ref 78.0–100.0)
Platelets: 273 10*3/uL (ref 150–400)
RBC: 2.77 MIL/uL — ABNORMAL LOW (ref 4.22–5.81)
RDW: 19.3 % — ABNORMAL HIGH (ref 11.5–15.5)
WBC: 7.4 10*3/uL (ref 4.0–10.5)

## 2014-10-06 LAB — GLUCOSE, CAPILLARY: Glucose-Capillary: 183 mg/dL — ABNORMAL HIGH (ref 70–99)

## 2014-10-06 LAB — GASTRIN: Gastrin: 82 pg/mL (ref 0–115)

## 2014-10-06 LAB — HEPARIN LEVEL (UNFRACTIONATED): Heparin Unfractionated: 0.48 IU/mL (ref 0.30–0.70)

## 2014-10-06 LAB — CEA: CEA: 41.6 ng/mL — ABNORMAL HIGH (ref 0.0–4.7)

## 2014-10-06 MED ORDER — INSULIN ASPART 100 UNIT/ML ~~LOC~~ SOLN
0.0000 [IU] | Freq: Three times a day (TID) | SUBCUTANEOUS | Status: DC
  Administered 2014-10-07 (×2): 2 [IU] via SUBCUTANEOUS
  Administered 2014-10-07: 1 [IU] via SUBCUTANEOUS
  Administered 2014-10-08 (×2): 2 [IU] via SUBCUTANEOUS
  Administered 2014-10-08: 1 [IU] via SUBCUTANEOUS

## 2014-10-06 MED ORDER — GUAIFENESIN-DM 100-10 MG/5ML PO SYRP: 5.0000 mL | ORAL_SOLUTION | ORAL | Status: DC | PRN

## 2014-10-06 MED ORDER — SODIUM CHLORIDE 0.9 % IJ SOLN
3.0000 mL | Freq: Two times a day (BID) | INTRAMUSCULAR | Status: DC
  Administered 2014-10-06 – 2014-10-07 (×2): 3 mL via INTRAVENOUS

## 2014-10-06 MED ORDER — MORPHINE SULFATE 2 MG/ML IJ SOLN
2.0000 mg | INTRAMUSCULAR | Status: DC | PRN
  Administered 2014-10-09: 2 mg via INTRAVENOUS
  Filled 2014-10-06: qty 1

## 2014-10-06 MED ORDER — HYDROCODONE-ACETAMINOPHEN 5-325 MG PO TABS: 1.0000 | ORAL_TABLET | ORAL | Status: DC | PRN

## 2014-10-06 MED ORDER — ALUM & MAG HYDROXIDE-SIMETH 200-200-20 MG/5ML PO SUSP: 30.0000 mL | Freq: Four times a day (QID) | ORAL | Status: DC | PRN

## 2014-10-06 MED ORDER — MUPIROCIN CALCIUM 2 % EX CREA
TOPICAL_CREAM | Freq: Two times a day (BID) | CUTANEOUS | Status: DC
  Administered 2014-10-06 (×2): via TOPICAL
  Administered 2014-10-07: 1 via TOPICAL
  Administered 2014-10-07 – 2014-10-08 (×3): via TOPICAL
  Administered 2014-10-09: 1 via TOPICAL
  Administered 2014-10-09: 10:00:00 via TOPICAL
  Administered 2014-10-10 (×2): 1 via TOPICAL
  Administered 2014-10-11 – 2014-10-16 (×10): via TOPICAL
  Filled 2014-10-06 (×4): qty 15

## 2014-10-06 MED ORDER — INSULIN ASPART 100 UNIT/ML ~~LOC~~ SOLN: 0.0000 [IU] | Freq: Every day | SUBCUTANEOUS | Status: DC

## 2014-10-06 NOTE — Progress Notes (Signed)
SUBJECTIVE: Pt asleep. Had a lengthy discussion with his wife, Alden Benjamin, whom I last saw on 2/5 when I started him on dobutamine. He has gone from 246 lbs to 214 lbs since then. He is being transferred to [facility] today for possible surgery. His wife says he has been doing well over past 24 hours. Remains on IV heparin for atrial fibrillation and upper extremity DVT.     Intake/Output Summary (Last 24 hours) at 10/06/14 1123 Last data filed at 10/06/14 6433  Gross per 24 hour  Intake    645 ml  Output   1950 ml  Net  -1305 ml    Current Facility-Administered Medications  Medication Dose Route Frequency Provider Last Rate Last Dose  . 0.9 %  sodium chloride infusion  250 mL Intravenous PRN Danie Binder, MD      . acetaminophen (TYLENOL) tablet 650 mg  650 mg Oral Q4H PRN Samuella Cota, MD      . antiseptic oral rinse (CPC / CETYLPYRIDINIUM CHLORIDE 0.05%) solution 7 mL  7 mL Mouth Rinse BID [name], MD   7 mL at 10/06/14 0949  . atorvastatin (LIPITOR) tablet 20 mg  20 mg Oral q1800 Samuella Cota, MD   20 mg at 10/05/14 1843  . carvedilol (COREG) tablet 3.125 mg  3.125 mg Oral BID WC Satira Sark, MD   3.125 mg at 10/06/14 0825  . feeding supplement (ENSURE COMPLETE) (ENSURE COMPLETE) liquid 237 mL  237 mL Oral BID BM Oswaldo Milian, RD   237 mL at 10/06/14 0945  . heparin ADULT infusion 100 units/mL (25000 units/250 mL)  1,600 Units/hr Intravenous Continuous Lavonia Drafts [place], RPH 16 mL/hr at 10/06/14 0200 1,600 Units/hr at 10/06/14 0200  . levothyroxine (SYNTHROID, LEVOTHROID) tablet 125 mcg  125 mcg Oral QAC breakfast Samuella Cota, MD   125 mcg at 10/06/14 0825  . ondansetron (ZOFRAN) injection 4 mg  4 mg Intravenous Q6H PRN Samuella Cota, MD      . pantoprazole (PROTONIX) EC tablet 40 mg  40 mg Oral BID AC Danie Binder, MD   40 mg at 10/06/14 0825  . polyethylene glycol (MIRALAX / GLYCOLAX) packet 17 g  17 g Oral Daily Danie Binder,  MD   17 g at 10/06/14 0945  . sodium chloride (OCEAN) 0.65 % nasal spray 2 spray  2 spray Each Nare QID Danie Binder, MD   2 spray at 10/06/14 0800  . sodium chloride 0.9 % injection 3 mL  3 mL Intravenous PRN Samuella Cota, MD   3 mL at 10/02/14 2031  . torsemide (DEMADEX) tablet 60 mg  60 mg Oral Daily Danie Binder, MD   60 mg at 10/06/14 0944    Filed Vitals:   10/05/14 1404 10/05/14 1842 10/05/14 2146 10/06/14 0626  BP: 112/42  118/60 122/71  Pulse: 58 75 55 84  Temp: 98.6 F (37 C)  98.7 F (37.1 C) 98.3 F (36.8 C)  TempSrc: Oral  Oral Oral  Resp: 20  20 20   Height:      Weight:    216 lb 3.2 oz (98.068 kg)  SpO2: 98%  98% 97%    PHYSICAL EXAM General: NAD. Asleep. Neck: No JVD.  Lungs: Clear to auscultation anteriorly, diminished at bases.. CV: Distant heart tones, irregular rhythm, normal S1/S2, no S3, no murmur.  No pretibial edema, chronic venous stasis. Abdomen: Soft, nontender, no  distention.   Musculoskeletal: No gross deformities. Extremities: No clubbing or cyanosis.   TELEMETRY: Reviewed telemetry pt in atrial flutter with PVC's, rate is controlled.  LABS: Basic Metabolic Panel:  Recent Labs  10/04/14 0519 10/05/14 0514  NA 138 137  K 4.4 4.1  CL 88* 90*  CO2 39* 37*  GLUCOSE 124* 134*  BUN 90* 83*  CREATININE 2.38* 2.38*  CALCIUM 8.8 8.9   Liver Function Tests: No results for input(s): AST, ALT, ALKPHOS, BILITOT, PROT, ALBUMIN in the last 72 hours. No results for input(s): LIPASE, AMYLASE in the last 72 hours. CBC:  Recent Labs  10/05/14 0514 10/06/14 0519  WBC 7.5 7.4  HGB 8.2* 7.9*  HCT 27.9* 26.0*  MCV 94.6 93.9  PLT 271 273   Cardiac Enzymes: No results for input(s): CKTOTAL, CKMB, CKMBINDEX, TROPONINI in the last 72 hours. BNP: Invalid input(s): POCBNP D-Dimer: No results for input(s): DDIMER in the last 72 hours. Hemoglobin A1C: No results for input(s): HGBA1C in the last 72 hours. Fasting Lipid Panel: No results  for input(s): CHOL, HDL, LDLCALC, TRIG, CHOLHDL, LDLDIRECT in the last 72 hours. Thyroid Function Tests: No results for input(s): TSH, T4TOTAL, T3FREE, THYROIDAB in the last 72 hours.  Invalid input(s): FREET3 Anemia Panel: No results for input(s): VITAMINB12, FOLATE, FERRITIN, TIBC, IRON, RETICCTPCT in the last 72 hours.  RADIOLOGY: Ct Abdomen Pelvis Wo Contrast  10/05/2014   CLINICAL DATA:  Known colonic mass on recent colonoscopy  EXAM: CT ABDOMEN AND PELVIS WITHOUT CONTRAST  TECHNIQUE: Multidetector CT imaging of the abdomen and pelvis was performed following the standard protocol without IV contrast.  COMPARISON:  09/02/2013  FINDINGS: Bilateral lower lobe consolidation with associated moderate effusions are seen. No parenchymal nodules are noted.  The gallbladder, spleen, adrenal glands and pancreas are normal in their CT appearance. The liver is predominately homogeneous in attenuation. A few vague areas of decreased attenuation are noted within the medial and lateral segment of the left lobe of the liver best seen on image number 21 of series 2. Given the clinical history the possibility of underlying metastatic disease could not be totally excluded. The kidneys are well visualized bilaterally and reveal no renal calculi or obstructive changes.  The previously described right lateral abdominal wall hernia has been rib reduced and repair. No residual hernia is seen. Some wall thickening is noted in the distal terminal ileum and also within the proximal ascending colon. These changes in the ascending colon may be related to the known underlying colonic mass. Scattered diverticular change is noted without diverticulitis. No significant pelvic mass lesion is seen. The bladder is well distended. No sizable lymphadenopathy is seen. Degenerative changes of the lumbar spine are seen. No bony lesion is noted.  IMPRESSION: Bilateral moderate pleural effusions with associated lower lobe atelectasis/  consolidation.  Wall thickening in the ascending colon likely related to the patient's given clinical history of colonic mass.  Hypodensities within the liver of uncertain etiology. The possibility of metastatic disease cannot be totally excluded. Ultrasound of the liver may be helpful in this regard.  Diverticulosis without diverticulitis.  No other focal abnormality is seen.   Electronically Signed   By: Inez Catalina M.D.   On: 10/05/2014 17:03   Dg Chest 2 View  09/22/2014   CLINICAL DATA:  Left anterior shoulder abrasion, Fall.  EXAM: CHEST  2 VIEW  COMPARISON:  09/02/2013  FINDINGS: There is a small to moderate right pleural effusion. Right lower lobe atelectasis or infiltrate. No  confluent opacity on the left. Cardiomegaly with vascular congestion. No acute bony abnormality.  IMPRESSION: Small to moderate right pleural effusion with right lower lobe atelectasis or consolidation.  Cardiomegaly, vascular congestion.   Electronically Signed   By: Rolm Baptise M.D.   On: 09/22/2014 08:20   Ct Head Wo Contrast  09/22/2014   CLINICAL DATA:  81 year old male with dizziness and fall this morning when going to the bathroom. Left side head and year injury. Labored breathing. Initial encounter.  EXAM: CT HEAD WITHOUT CONTRAST  TECHNIQUE: Contiguous axial images were obtained from the base of the skull through the vertex without intravenous contrast.  COMPARISON:  None.  FINDINGS: Chronic paranasal sinus surgery and changes of chronic sinusitis. Mastoids are clear.  No visible orbit or scalp soft tissue injury identified. No acute osseous abnormality identified.  Scattered areas of chronic appearing encephalomalacia in the left MCA territory. Chronic lacunar infarct in the left thalamus. Mild ex vacuo enlargement of the left lateral ventricle. No midline shift, mass effect, or evidence of intracranial mass lesion. No acute intracranial hemorrhage identified. No evidence of cortically based acute infarction identified.  No suspicious intracranial vascular hyperdensity. Possible small chronic lacunar infarcts in the cerebellar tonsils.  IMPRESSION: 1. No acute intracranial abnormality. No acute traumatic injury identified. 2. Chronic small and medium-sized vessel ischemia, mostly in the left hemisphere.   Electronically Signed   By: Lars Pinks M.D.   On: 09/22/2014 08:11   US Venous Img Upper Uni Right  09/23/2014   CLINICAL DATA:  Right upper extremity edema, pain  EXAM: RIGHT UPPER EXTREMITY VENOUS DOPPLER ULTRASOUND  TECHNIQUE: Gray-scale sonography with graded compression, as well as color Doppler and duplex ultrasound were performed to evaluate the upper extremity deep venous system from the level of the subclavian vein and including the jugular, axillary, basilic, radial, ulnar and upper cephalic vein. Spectral Doppler was utilized to evaluate flow at rest and with distal augmentation maneuvers.  COMPARISON:  None.  FINDINGS: Contralateral Subclavian Vein: Respiratory phasicity is normal and symmetric with the symptomatic side. No evidence of thrombus. Normal compressibility.  Internal Jugular Vein: No evidence of thrombus. Normal compressibility, respiratory phasicity and response to augmentation.  Subclavian Vein: No evidence of thrombus. Normal compressibility, respiratory phasicity and response to augmentation.  Axillary Vein: Intraluminal hypoechoic thrombus present in the right axillary vein appearing occlusive. Axillary vein is noncompressible.  Cephalic Vein: No evidence of thrombus. Normal compressibility, respiratory phasicity and response to augmentation.  Basilic Vein: No evidence of thrombus. Normal compressibility, respiratory phasicity and response to augmentation.  Brachial Veins: Axillary thrombus extends into the brachial vein which is also occlusive and noncompressible. Brachial vein is duplicated.  Radial Veins: No evidence of thrombus. Normal compressibility, respiratory phasicity and response to  augmentation.  Ulnar Veins: No evidence of thrombus. Normal compressibility, respiratory phasicity and response to augmentation.  Venous Reflux:  None visualized.  Other Findings:  Subcutaneous for arm edema evident.  IMPRESSION: Positive exam for right upper extremity axillary and brachial acute occlusive DVT.  These results will be called to the ordering clinician or representative by the Radiology Department at the imaging location.   Electronically Signed   By: Daryll Brod M.D.   On: 09/23/2014 16:02   Dg Shoulder Left  09/22/2014   CLINICAL DATA:  Fall last night with abrasion and injury to left shoulder. Initial encounter.  EXAM: LEFT SHOULDER - 2+ VIEW  COMPARISON:  None.  FINDINGS: No acute fracture or dislocation identified. There are  mild degenerative changes involving the [facility] joint and glenohumeral joint. No bony lesions or destruction.  IMPRESSION: No acute fracture identified.   Electronically Signed   By: Aletta Edouard M.D.   On: 09/22/2014 08:18    ECHOCARDIOGRAM (09/23/14):  - Procedure narrative: Transthoracic echocardiography. Image quality was adequate. The study was technically difficult, as a result of poor patient compliance. - Left ventricle: The cavity size was normal. Systolic function was severely reduced. The estimated ejection fraction was in the range of 20% to 25%. The study was not technically sufficient to allow evaluation of LV diastolic dysfunction due to atrial fibrillation. Doppler parameters are consistent with high ventricular filling pressure. Mild posterior wall with severe asymmetric septal hypertrophy (2.16 cm). - Ventricular septum: Septal motion showed abnormal function and dyssynergy. These changes are consistent with a left bundle branch block. - Aortic valve: Mildly calcified annulus. Trileaflet; mildly thickened leaflets. There was trivial regurgitation. - Mitral valve: Mildly thickened annulus. Mildly thickened  leaflets . There was mild regurgitation. - Left atrium: The atrium was severely dilated. Volume/bsa, ES, (1-plane Simpson&'s, A2C): 42.6 ml/m^2. - Right ventricle: The cavity size was mildly dilated. Systolic function was mildly reduced. - Right atrium: The atrium was mildly to moderately dilated. - Tricuspid valve: There was mild regurgitation. - Pulmonary arteries: PA peak pressure: 50 mm Hg (S). Moderately elevated pulmonary pressures. - Systemic veins: IVC is dilated with normal respiratory variation. Estimated CVP 8 mmHg.  Impressions:  - When compared to report dated 6/59/93, LV systolic function has significantly declined.   ASSESSMENT AND PLAN:  1. Acute on chronic systolic heart failure, EF 20-25%: I started low-dose dobutamine back on 2/5 and he has since lost 32 lbs. Now on oral torsemide 60 mg. SCr 2.38 on 2/16. 2. Atrial flutter with controlled ventricular response: Currently on Coreg to maintain optimal HR control. Suboptimal candidate for long-term anticoagulation but this can be revisited in the future after GI mass is addressed. He has multiple small ulcers in the gastric antrum, bulb, and 2nd part of duodenum as well. 3. Right upper extremity brachial and axillary acute occlusive DVT on 2/4: Currently on IV heparin but this may need discontinuation given aforementioned EGD findings. Hgb will need to be carefully monitored, currently 7.9. He would likely require long-term anticoagulation for this, although this will need thoughtful consideration after GI mass is addressed. 4. Acute on chronic renal failure: SCr 2.38 on 2/16. Nephrology following. 5. GI circumferential mass at hepatic flexure: Being transferred to [facility] for possible surgery which is high-risk based upon multiple comorbidities including severe LV dysfunction and concomitant heart failure.    Kate Sable, M.D., F.A.C.C.

## 2014-10-06 NOTE — Progress Notes (Signed)
[place] for Heparin Indication: DVT  Allergies  Allergen Reactions  . Celery Oil     vomiting   Patient Measurements: Height: 5\' 11"  (180.3 cm) Weight: 216 lb 3.2 oz (98.068 kg) IBW/kg (Calculated) : 75.3 Heparin Dosing Weight: 97.8 kg  Vital Signs: Temp: 98.3 F (36.8 C) (02/17 0626) Temp Source: Oral (02/17 0626) BP: 122/71 mmHg (02/17 0626) Pulse Rate: 84 (02/17 0626)  Labs:  Recent Labs  10/04/14 0519 10/05/14 0514 10/05/14 1638 10/06/14 0519  HGB 7.6* 8.2*  --  7.9*  HCT 25.5* 27.9*  --  26.0*  PLT 245 271  --  273  HEPARINUNFRC 0.49  --  0.27* 0.48  CREATININE 2.38* 2.38*  --   --    Estimated Creatinine Clearance: 29.6 mL/min (by C-G formula based on Cr of 2.38).  Medical History: Past Medical History  Diagnosis Date  . Type 2 diabetes mellitus   . Anemia of chronic disease   . CKD (chronic kidney disease) stage 3, GFR 30-59 ml/min   . Herpes zoster   . Obesity   . Exposure to hazardous substance   . Intermittent Leukopenia   . Essential hypertension   . Sleep apnea     Sleep Apnea score of 7  . Cardiomyopathy     LVEF 40-45% January 2015  . Left bundle branch block    Medications:  Scheduled:  . antiseptic oral rinse  7 mL Mouth Rinse BID  . atorvastatin  20 mg Oral q1800  . carvedilol  3.125 mg Oral BID WC  . feeding supplement (ENSURE COMPLETE)  237 mL Oral BID BM  . levothyroxine  125 mcg Oral QAC breakfast  . pantoprazole  40 mg Oral BID AC  . polyethylene glycol  17 g Oral Daily  . sodium chloride  2 spray Each Nare QID  . torsemide  60 mg Oral Daily   Assessment: [name] admitted with new onset Afib & transfusion dependent anemia, FOBT x3 as outpatient.  Anticoagulation was initially held pending GI work-up.  Venous doppler ultrasound now positive for right upper extremity axillary and brachial acute occlusive DVT and pharmacy was asked to initiate heparin.  Heparin was held for colonoscopy &  resumed post procedure with no bolus per cardiology.   Nearly obstructing colon CA per colonoscopy.  Plan to tx to [facility] for high risk surgery.  Heparin level is therapeutic.      No bleeding noted.   Goal of Therapy:  Heparin level 0.3-0.7 units/ml Monitor platelets by anticoagulation protocol: Yes   Plan:  Continue heparin infusion at 1600 units/hr  Daily heparin level & CBC while on heparin F/U plan for long-term anticoagulation once post-op  Hart Robinsons A 10/06/2014,8:59 AM

## 2014-10-06 NOTE — Progress Notes (Signed)
Greg [name]  MRN: [id number]  DOB/AGE: 28-Apr-1932 81 y.o.  Primary Care Physician:HAWKINS,EDWARD L, MD  Admit date: 09/22/2014  Chief Complaint:  Chief Complaint  Patient presents with  . Fall    S-Pt presented on  09/22/2014 with  Chief Complaint  Patient presents with  . Fall  .    Pt today feels better.    Pt says " I am going to [facility]"  Meds . antiseptic oral rinse  7 mL Mouth Rinse BID  . atorvastatin  20 mg Oral q1800  . carvedilol  3.125 mg Oral BID WC  . feeding supplement (ENSURE COMPLETE)  237 mL Oral BID BM  . levothyroxine  125 mcg Oral QAC breakfast  . pantoprazole  40 mg Oral BID AC  . polyethylene glycol  17 g Oral Daily  . sodium chloride  2 spray Each Nare QID  . torsemide  60 mg Oral Daily       Physical Exam: Vital signs in last 24 hours: Temp:  [98.3 F (36.8 C)-98.7 F (37.1 C)] 98.3 F (36.8 C) (02/17 0626) Pulse Rate:  [55-84] 84 (02/17 0626) Resp:  [20] 20 (02/17 0626) BP: (112-122)/(42-71) 122/71 mmHg (02/17 0626) SpO2:  [97 %-98 %] 97 % (02/17 0626) Weight:  [216 lb 3.2 oz (98.068 kg)] 216 lb 3.2 oz (98.068 kg) (02/17 0626) Weight change: 1 lb 4.8 oz (0.59 kg) Last BM Date: 10/04/14  Intake/Output from previous day: 02/16 0701 - 02/17 0700 In: 1125 [P.O.:960; I.V.:165] Out: 1950 [Urine:1950]     Physical Exam: General- pt is awake,alert, oriented to time place and person Resp- No acute REsp distress,  Decreased bs at bases, occasional ronchi. CVS- S1S2 regular in rate and rhythm GIT- BS+, soft, NT. EXT- 1+ LE Edema, NO Cyanosis   Lab Results: CBC  Recent Labs  10/05/14 0514 10/06/14 0519  WBC 7.5 7.4  HGB 8.2* 7.9*  HCT 27.9* 26.0*  PLT 271 273    BMET  Recent Labs  10/04/14 0519 10/05/14 0514  NA 138 137  K 4.4 4.1  CL 88* 90*  CO2 39* 37*  GLUCOSE 124* 134*  BUN 90* 83*  CREATININE 2.38* 2.38*  CALCIUM 8.8 8.9   Creat Trend 2016  3.3=>2.95=>2.72=>2.55=>2.38-2.38 2015  2.66=>1.79=>1.70 2012   1.77 2011  1.66--2.3 2009  1.6 2008   1.45  MICRO Recent Results (from the past 240 hour(s))  Clostridium Difficile by PCR     Status: None   Collection Time: 09/30/14  1:55 PM  Result Value Ref Range Status   C difficile by pcr NEGATIVE NEGATIVE Final      Lab Results  Component Value Date   PTH 186.4* 09/04/2013   CALCIUM 8.9 10/05/2014   PHOS 3.1 09/05/2013         Impression: 1)Renal  AKI secondary to Prerenal/ATN/Cardiorenal                AKI on CKD                AKI now better                Creat at baseline               CKD stage 3.               CKD since 2008               CKD secondary to DM/ Age asso decline  Progression of CKD marked with multiple  AKI                Proteinura  Present-                    Less than 500mg    2)CHF  Was on Dobutamine + Lasix              Pt negative by 21 liters               now better  3)Anemia HGb low                GI following  4)CKD Mineral-Bone Disorder PTH elevated. Secondary Hyperparathyroidism  Present. Phosphorus at goal.   5)Arrhythmia -Atrial flutter  Primary MD and Cardiology following  6)Electrolytes Normokalemic NOrmonatremic   7)Acid base Co2 on higher side.  8) DVT - was on IV on Heparin     Held sec to bleed/mass  9) GI-Pt now with colonic mass    Surgery, GI and Primary team following    Possible transfer to [facility]  Plan:  Will continue current care   [place] S 10/06/2014, 9:45 AM

## 2014-10-06 NOTE — Progress Notes (Signed)
PT Cancellation Note  Patient Details Name: Greg [name] MRN: [id number] DOB: [birth year]   Cancelled Treatment:     Pt and wife state that they are being transferred to [facility] today and prefer not to start therapy until after his operation.  Will discharge pt from current services.   Terease Marcotte,CINDY 10/06/2014, 2:45 PM

## 2014-10-06 NOTE — Progress Notes (Signed)
He is being transferred to [facility] today for potential surgery. He is doing okay. I asked him to not eat breakfast today in case he needs to start a bowel prep.

## 2014-10-06 NOTE — H&P (Signed)
Patient Demographics  Greg [name], is a 81 y.o. male  MRN: [id number]   DOB - 07/20/1932  Admit Date - 09/22/2014  Outpatient Primary MD for the patient is Greg Bogus, MD   With History of -  Past Medical History  Diagnosis Date  . Type 2 diabetes mellitus   . Anemia of chronic disease   . CKD (chronic kidney disease) stage 3, GFR 30-59 ml/min   . Herpes zoster   . Obesity   . Exposure to hazardous substance   . Intermittent Leukopenia   . Essential hypertension   . Sleep apnea     Sleep Apnea score of 7  . Cardiomyopathy     LVEF 40-45% January 2015  . Left bundle branch block       Past Surgical History  Procedure Laterality Date  . Nasal polyp surgery    . Ventral hernia repair N/A 09/14/2013    Procedure: HERNIA REPAIR VENTRAL ADULT WITH MESH;  Surgeon: Jamesetta So, MD;  Location: [facility];  Service: General;  Laterality: N/A;  . Insertion of mesh N/A 09/14/2013    Procedure: INSERTION OF MESH;  Surgeon: Jamesetta So, MD;  Location: [facility];  Service: General;  Laterality: N/A;  . Colonoscopy N/A 10/04/2014    Procedure: COLONOSCOPY;  Surgeon: Danie Binder, MD;  Location: [facility];  Service: Endoscopy;  Laterality: N/A;  . Esophagogastroduodenoscopy N/A 10/04/2014    Procedure: ESOPHAGOGASTRODUODENOSCOPY (EGD);  Surgeon: Danie Binder, MD;  Location: [facility];  Service: Endoscopy;  Laterality: N/A;    in for   Chief Complaint  Patient presents with  . Fall     HPI  Greg [name]  is a 81 y.o. male,  With history of type 2 diabetes mellitus, chronic kidney disease stage III Baseline creatinine around 2.3, essential hypertension, sleep apnea, left bundle branch block, systolic heart failure EF now 20-25%, [facility] which he complains of  weakness and fall,he was found to have acute on chronic respiratory failure with acute on chronic systolic and diastolic CHF, new onset atrial fibrillation and anemia. He was seen by cardiology, he was initially placed on dobutamine drip along with diuretics for atrial fibrillation and CHF, he was also seen by GI for anemia workup and colonoscopy relieved colonic mass. He was thought to be high risk for surgery at [facility] and transferred here for further care. He also developed acute renal failure and renal was following over therefore medical treatment as well.  Currently besides generalized weakness patient is symptom-free, he does condone to weight loss at home which was unintentional, he says he has not been out of the bed since he has been in the hospital for 2 weeks, denies any chest pain, no shortness of breath at this time, no blood in stool or urine, no personal or family history of colon cancers, he  never had any colonoscopy previously.    Review of Systems    In addition to the HPI above,   No Fever-chills, No Headache, No changes with Vision or hearing, No problems swallowing food or Liquids, No Chest pain, Cough or Shortness of Breath, No Abdominal pain, No Nausea or Vommitting, Bowel movements are regular, No Blood in stool or Urine, No dysuria, No new skin rashes or bruises, No new joints pains-aches,  No new weakness, tingling, numbness in any extremity, +ve generalized weakness No recent weight gain  , No polyuria, polydypsia or polyphagia, No significant Mental Stressors.  A full 10 point Review of Systems was done, except as stated above, all other Review of Systems were negative.   Social History History  Substance Use Topics  . Smoking status: Former Smoker -- 5.00 packs/day for 25 years    Types: Cigars  . Smokeless tobacco: Current User    Types: Chew  . Alcohol Use: No      Family History Family History  Problem Relation Age of Onset  . Heart attack  Brother     CABG  . Diabetes Brother   . Stroke Father   . Heart Problems Mother   . Colon cancer Neg Hx       Prior to Admission medications   Medication Sig Start Date End Date Taking? Authorizing Provider  atorvastatin (LIPITOR) 20 MG tablet Take 20 mg by mouth daily.   Yes Historical Provider, MD  levothyroxine (SYNTHROID, LEVOTHROID) 125 MCG tablet Take 125 mcg by mouth daily before breakfast.   Yes Historical Provider, MD  Multiple Vitamins-Minerals (CENTRUM SILVER PO) Take 1 tablet by mouth daily.   Yes Historical Provider, MD  aspirin EC 81 MG tablet Take 81 mg by mouth every other day.     Historical Provider, MD  carvedilol (COREG) 3.125 MG tablet Take 1 tablet (3.125 mg total) by mouth 2 (two) times daily with a meal. 09/08/13   Greg Bogus, MD    Allergies  Allergen Reactions  . Celery Oil     vomiting    Physical Exam  Vitals  Blood pressure 113/53, pulse 65, temperature 98.3 F (36.8 C), temperature source Oral, resp. rate 16, height 5\' 11"  (1.803 m), weight 90.5 kg (199 lb 8.3 oz), SpO2 95 %.   1. General elderly white male lying in bed in NAD,    2. Normal affect and insight, Not Suicidal or Homicidal, Awake Alert, Oriented X 3.  3. No F.N deficits, ALL C.Nerves Intact, Strength 5/5 all 4 extremities, Sensation intact all 4 extremities, Plantars down going.  4. Ears and Eyes appear Normal, Conjunctivae clear, PERRLA. Moist Oral Mucosa.  5. Supple Neck, No JVD, No cervical lymphadenopathy appriciated, No Carotid Bruits.  6. Symmetrical Chest wall movement, Good air movement bilaterally, CTAB.  7. iRRR, No Gallops, Rubs or Murmurs, No Parasternal Heave.  8. Positive Bowel Sounds, Abdomen Soft, No tenderness, No organomegaly appriciated,No rebound -guarding or rigidity.  9.  No Cyanosis, Normal Skin Turgor, No Skin Rash or Bruise.  10. Good muscle tone,  joints appear normal , no effusions, Normal ROM.  11. No Palpable Lymph Nodes in Neck or  Axillae    Data Review  CBC  Recent Labs Lab 10/01/14 0413 10/02/14 0550 10/03/14 0946 10/04/14 0519 10/05/14 0514 10/06/14 0519  WBC 7.9 7.1 7.0 6.7 7.5 7.4  HGB 8.3* 8.3* 8.4* 7.6* 8.2* 7.9*  HCT 28.8* 27.8* 28.3* 25.5* 27.9* 26.0*  PLT 196 216 220 245  271 273  MCV 94.4 93.0 93.1 93.8 94.6 93.9  MCH 27.2 27.8 27.6 27.9 27.8 28.5  MCHC 28.8* 29.9* 29.7* 29.8* 29.4* 30.4  RDW 18.6* 18.5* 18.7* 18.9* 18.8* 19.3*  LYMPHSABS 1.5  --   --   --   --   --   MONOABS 0.8  --   --   --   --   --   EOSABS 0.2  --   --   --   --   --   BASOSABS 0.0  --   --   --   --   --    ------------------------------------------------------------------------------------------------------------------  Chemistries   Recent Labs Lab 10/01/14 0413 10/02/14 0550 10/03/14 0510 10/04/14 0519 10/05/14 0514  NA 138 137 137 138 137  K 4.1 4.2 4.3 4.4 4.1  CL 90* 88* 89* 88* 90*  CO2 37* 37* 40* 39* 37*  GLUCOSE 133* 129* 130* 124* 134*  BUN 89* 94* 89* 90* 83*  CREATININE 2.50* 2.45* 2.44* 2.38* 2.38*  CALCIUM 8.7 8.8 8.7 8.8 8.9  AST  --  34  --   --   --   ALT  --  58*  --   --   --   ALKPHOS  --  48  --   --   --   BILITOT  --  0.6  --   --   --    ------------------------------------------------------------------------------------------------------------------ estimated creatinine clearance is 28.5 mL/min (by C-G formula based on Cr of 2.38). ------------------------------------------------------------------------------------------------------------------ No results for input(s): TSH, T4TOTAL, T3FREE, THYROIDAB in the last 72 hours.  Invalid input(s): FREET3   Coagulation profile No results for input(s): INR, PROTIME in the last 168 hours. ------------------------------------------------------------------------------------------------------------------- No results for input(s): DDIMER in the last 72  hours. -------------------------------------------------------------------------------------------------------------------  Cardiac Enzymes No results for input(s): CKMB, TROPONINI, MYOGLOBIN in the last 168 hours.  Invalid input(s): CK ------------------------------------------------------------------------------------------------------------------ Invalid input(s): POCBNP   ---------------------------------------------------------------------------------------------------------------  Urinalysis    Component Value Date/Time   COLORURINE YELLOW 07/22/2010 0601   APPEARANCEUR CLEAR 07/22/2010 0601   LABSPEC 1.025 07/22/2010 0601   PHURINE 5.5 07/22/2010 0601   GLUCOSEU NEGATIVE 07/22/2010 0601   HGBUR MODERATE* 07/22/2010 0601   BILIRUBINUR NEGATIVE 07/22/2010 0601   KETONESUR NEGATIVE 07/22/2010 0601   PROTEINUR 30* 07/22/2010 0601   UROBILINOGEN 0.2 07/22/2010 0601   NITRITE NEGATIVE 07/22/2010 0601   LEUKOCYTESUR NEGATIVE 07/22/2010 0601    ----------------------------------------------------------------------------------------------------------------  Imaging results:   Ct Abdomen Pelvis Wo Contrast  10/05/2014   CLINICAL DATA:  Known colonic mass on recent colonoscopy  EXAM: CT ABDOMEN AND PELVIS WITHOUT CONTRAST  TECHNIQUE: Multidetector CT imaging of the abdomen and pelvis was performed following the standard protocol without IV contrast.  COMPARISON:  09/02/2013  FINDINGS: Bilateral lower lobe consolidation with associated moderate effusions are seen. No parenchymal nodules are noted.  The gallbladder, spleen, adrenal glands and pancreas are normal in their CT appearance. The liver is predominately homogeneous in attenuation. A few vague areas of decreased attenuation are noted within the medial and lateral segment of the left lobe of the liver best seen on image number 21 of series 2. Given the clinical history the possibility of underlying metastatic disease could not be  totally excluded. The kidneys are well visualized bilaterally and reveal no renal calculi or obstructive changes.  The previously described right lateral abdominal wall hernia has been rib reduced and repair. No residual hernia is seen. Some wall thickening is noted in the distal terminal ileum and also  within the proximal ascending colon. These changes in the ascending colon may be related to the known underlying colonic mass. Scattered diverticular change is noted without diverticulitis. No significant pelvic mass lesion is seen. The bladder is well distended. No sizable lymphadenopathy is seen. Degenerative changes of the lumbar spine are seen. No bony lesion is noted.  IMPRESSION: Bilateral moderate pleural effusions with associated lower lobe atelectasis/ consolidation.  Wall thickening in the ascending colon likely related to the patient's given clinical history of colonic mass.  Hypodensities within the liver of uncertain etiology. The possibility of metastatic disease cannot be totally excluded. Ultrasound of the liver may be helpful in this regard.  Diverticulosis without diverticulitis.  No other focal abnormality is seen.   Electronically Signed   By: Inez Catalina M.D.   On: 10/05/2014 17:03    My personal review of EKG: Rhythm a flutter, Rate  60s /min,  no Acute ST changes    Assessment & Plan   1. 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 Have called general surgery as he eventually will require surgical resection.  Once surgery is completed we will involve oncology as well.    2. Acute on chronic systolic heart failure EF now 25%.continue present dose beta blocker and diuretic, he was initially on to between drip, cardiology has been consulted for further management.no ACE/ARB due to renal failure.   3. Atrial flutter new onset. Continue beta blocker for rate control, telemetry monitor, no anticoagulation due to #1 above.   4. DM type II. check A1c, sliding  scale here.   5.ARF on chronic injury disease stage III. Baseline creatinine around 2.3. Continue diuresis and monitor. Avoid nephrotoxins, no ACE/ARB.   6. Severe protein calorie malnutrition. Nutritionist input after surgery.   7. Liver hyperdensities. Could be colonic metastasis. Oncology input after surgery.    DVT Prophylaxis  SCDs    AM Labs Ordered, also please review Full Orders  Family Communication: Admission, patients condition and plan of care including tests being ordered have been discussed with the patient   who indicates understanding and agree with the plan and Code Status.  Code Status Full  Likely DC to  TBD  Condition GUARDED   Time spent in minutes : 45    Thekla Colborn K M.D on 10/06/2014 at 5:11 PM  Between 7am to 7pm - Pager - [phone]  After 7pm go to [email or web address] - [facility] Office  [phone]

## 2014-10-06 NOTE — Progress Notes (Signed)
2 Days Post-Op  Subjective: Arrived late this afternoon from [facility]. No abdominal pain.  Objective: Vital signs in last 24 hours: Temp:  [98.3 F (36.8 C)-98.7 F (37.1 C)] 98.3 F (36.8 C) (02/17 1706) Pulse Rate:  [55-84] 65 (02/17 1706) Resp:  [16-20] 16 (02/17 1706) BP: (106-122)/(34-71) 113/53 mmHg (02/17 1706) SpO2:  [95 %-98 %] 95 % (02/17 1706) Weight:  [199 lb 8.3 oz (90.5 kg)-216 lb 3.2 oz (98.068 kg)] 199 lb 8.3 oz (90.5 kg) (02/17 1705) Last BM Date: 10/04/14  Intake/Output from previous day: 02/16 0701 - 02/17 0700 In: 1125 [P.O.:960; I.V.:165] Out: 1950 [Urine:1950] Intake/Output this shift: Total I/O In: -  Out: 1200 [Urine:1200]  General appearance: alert and cooperative Resp: Few Rales bilaterally Cardio: irregularly irregular rhythm GI: Soft, nontender, nondistended  Lab Results:   Recent Labs  10/05/14 0514 10/06/14 0519  WBC 7.5 7.4  HGB 8.2* 7.9*  HCT 27.9* 26.0*  PLT 271 273   BMET  Recent Labs  10/04/14 0519 10/05/14 0514  NA 138 137  K 4.4 4.1  CL 88* 90*  CO2 39* 37*  GLUCOSE 124* 134*  BUN 90* 83*  CREATININE 2.38* 2.38*  CALCIUM 8.8 8.9   PT/INR No results for input(s): LABPROT, INR in the last 72 hours. ABG No results for input(s): PHART, HCO3 in the last 72 hours.  Invalid input(s): PCO2, PO2  Studies/Results: Ct Abdomen Pelvis Wo Contrast  10/05/2014   CLINICAL DATA:  Known colonic mass on recent colonoscopy  EXAM: CT ABDOMEN AND PELVIS WITHOUT CONTRAST  TECHNIQUE: Multidetector CT imaging of the abdomen and pelvis was performed following the standard protocol without IV contrast.  COMPARISON:  09/02/2013  FINDINGS: Bilateral lower lobe consolidation with associated moderate effusions are seen. No parenchymal nodules are noted.  The gallbladder, spleen, adrenal glands and pancreas are normal in their CT appearance. The liver is predominately homogeneous in attenuation. A few vague areas of decreased attenuation are  noted within the medial and lateral segment of the left lobe of the liver best seen on image number 21 of series 2. Given the clinical history the possibility of underlying metastatic disease could not be totally excluded. The kidneys are well visualized bilaterally and reveal no renal calculi or obstructive changes.  The previously described right lateral abdominal wall hernia has been rib reduced and repair. No residual hernia is seen. Some wall thickening is noted in the distal terminal ileum and also within the proximal ascending colon. These changes in the ascending colon may be related to the known underlying colonic mass. Scattered diverticular change is noted without diverticulitis. No significant pelvic mass lesion is seen. The bladder is well distended. No sizable lymphadenopathy is seen. Degenerative changes of the lumbar spine are seen. No bony lesion is noted.  IMPRESSION: Bilateral moderate pleural effusions with associated lower lobe atelectasis/ consolidation.  Wall thickening in the ascending colon likely related to the patient's given clinical history of colonic mass.  Hypodensities within the liver of uncertain etiology. The possibility of metastatic disease cannot be totally excluded. Ultrasound of the liver may be helpful in this regard.  Diverticulosis without diverticulitis.  No other focal abnormality is seen.   Electronically Signed   By: Inez Catalina M.D.   On: 10/05/2014 17:03    Anti-infectives: Anti-infectives    None      Assessment/Plan: R colon mass at hepatic flexure - near obstructing. Will need extended right colectomy with possible ileostomy. In light of significant cardiac issues,  I agree with Dr. Rosendo Gros in his evaluation. We'll plan further cardiology consultation and optimization. We'll plan surgery 2/19. Procedure, risks which are significant, and benefits were discussed in detail the patient and his wife. Allow clear liquids only as this is a near obstructing  tumor. We will follow-up tomorrow and discuss things further and make final plans pending cardiology evaluation.  LOS: 14 days    Karion Cudd E 10/06/2014

## 2014-10-06 NOTE — Clinical Social Work Note (Signed)
CSW notified Elysian that pt is transferring to [facility] and will update CSW there.  [name], [place]

## 2014-10-07 ENCOUNTER — Inpatient Hospital Stay (HOSPITAL_COMMUNITY): Payer: Medicare Other

## 2014-10-07 LAB — BASIC METABOLIC PANEL
Anion gap: 7 (ref 5–15)
BUN: 77 mg/dL — AB (ref 6–23)
CO2: 38 mmol/L — ABNORMAL HIGH (ref 19–32)
CREATININE: 2.62 mg/dL — AB (ref 0.50–1.35)
Calcium: 8.5 mg/dL (ref 8.4–10.5)
Chloride: 91 mmol/L — ABNORMAL LOW (ref 96–112)
GFR, EST AFRICAN AMERICAN: 25 mL/min — AB (ref >90)
GFR, EST NON AFRICAN AMERICAN: 22 mL/min — AB (ref >90)
GLUCOSE: 133 mg/dL — AB (ref 70–99)
Potassium: 4.5 mmol/L (ref 3.5–5.1)
Sodium: 136 mmol/L (ref 135–145)

## 2014-10-07 LAB — GLUCOSE, CAPILLARY
GLUCOSE-CAPILLARY: 135 mg/dL — AB (ref 70–99)
Glucose-Capillary: 182 mg/dL — ABNORMAL HIGH (ref 70–99)
Glucose-Capillary: 192 mg/dL — ABNORMAL HIGH (ref 70–99)
Glucose-Capillary: 144 mg/dL — ABNORMAL HIGH (ref 70–99)

## 2014-10-07 LAB — CBC
HEMATOCRIT: 25.3 % — AB (ref 39.0–52.0)
Hemoglobin: 7.6 g/dL — ABNORMAL LOW (ref 13.0–17.0)
MCH: 27.8 pg (ref 26.0–34.0)
MCHC: 30 g/dL (ref 30.0–36.0)
MCV: 92.7 fL (ref 78.0–100.0)
Platelets: 270 10*3/uL (ref 150–400)
RBC: 2.73 MIL/uL — AB (ref 4.22–5.81)
RDW: 19.3 % — ABNORMAL HIGH (ref 11.5–15.5)
WBC: 5.1 10*3/uL (ref 4.0–10.5)

## 2014-10-07 LAB — HEMOGLOBIN A1C
Hgb A1c MFr Bld: 6.5 % — ABNORMAL HIGH (ref 4.8–5.6)
Mean Plasma Glucose: 140 mg/dL

## 2014-10-07 LAB — ABO/RH: ABO/RH(D): A POS

## 2014-10-07 LAB — HEMOGLOBIN AND HEMATOCRIT, BLOOD
HCT: 27.5 % — ABNORMAL LOW (ref 39.0–52.0)
HEMOGLOBIN: 8.6 g/dL — AB (ref 13.0–17.0)

## 2014-10-07 LAB — PREPARE RBC (CROSSMATCH)

## 2014-10-07 LAB — HEPARIN LEVEL (UNFRACTIONATED): Heparin Unfractionated: 0.57 IU/mL (ref 0.30–0.70)

## 2014-10-07 MED ORDER — HEPARIN (PORCINE) IN NACL 100-0.45 UNIT/ML-% IJ SOLN
1600.0000 [IU]/h | INTRAMUSCULAR | Status: AC
  Administered 2014-10-07 – 2014-10-08 (×2): 1600 [IU]/h via INTRAVENOUS
  Filled 2014-10-07 (×2): qty 250

## 2014-10-07 MED ORDER — SODIUM CHLORIDE 0.9 % IV SOLN: 250.0000 mL | INTRAVENOUS | Status: DC | PRN

## 2014-10-07 MED ORDER — HYDROCODONE-ACETAMINOPHEN 5-325 MG PO TABS: 1.0000 | ORAL_TABLET | ORAL | Status: DC | PRN

## 2014-10-07 MED ORDER — TORSEMIDE 20 MG PO TABS
30.0000 mg | ORAL_TABLET | Freq: Every day | ORAL | Status: DC
  Administered 2014-10-07 – 2014-10-11 (×3): 30 mg via ORAL
  Filled 2014-10-07 (×6): qty 1

## 2014-10-07 MED ORDER — FUROSEMIDE 10 MG/ML IJ SOLN
20.0000 mg | Freq: Once | INTRAMUSCULAR | Status: AC
  Administered 2014-10-07: 20 mg via INTRAVENOUS
  Filled 2014-10-07: qty 2

## 2014-10-07 MED ORDER — BOOST / RESOURCE BREEZE PO LIQD
1.0000 | Freq: Two times a day (BID) | ORAL | Status: DC
  Administered 2014-10-07: 1 via ORAL

## 2014-10-07 MED ORDER — FUROSEMIDE 10 MG/ML IJ SOLN: 40.0000 mg | Freq: Once | INTRAMUSCULAR | Status: DC

## 2014-10-07 MED ORDER — SODIUM CHLORIDE 0.9 % IV SOLN
Freq: Once | INTRAVENOUS | Status: AC
  Administered 2014-10-07: 13:00:00 via INTRAVENOUS

## 2014-10-07 MED ORDER — DIPHENHYDRAMINE HCL 50 MG/ML IJ SOLN: 25.0000 mg | Freq: Four times a day (QID) | INTRAMUSCULAR | Status: DC | PRN

## 2014-10-07 MED ORDER — SODIUM CHLORIDE 0.9 % IV SOLN: Freq: Once | INTRAVENOUS | Status: AC

## 2014-10-07 MED ORDER — DEXTROSE 5 % IV SOLN
2.0000 g | INTRAVENOUS | Status: AC
  Filled 2014-10-07: qty 2

## 2014-10-07 MED ORDER — OFF THE BEAT BOOK
Freq: Once | Status: AC
  Administered 2014-10-07: 08:00:00
  Filled 2014-10-07: qty 1

## 2014-10-07 NOTE — Progress Notes (Signed)
Patient Profile: 81 y/o male with h/o LBBB, type 2 diabetes mellitus, obesity, stage III renal disease, and cardiomyopathy with prior LVEF of 40-45%. Admitted to [facility] 09/22/14 for acute hypoxic respiratory failure 2/2 acute on chronic systolic + diastolic CHF. Repeat 2D echo demonstrated severely reduced EF of 20-25%. Also with new diagnosis of atrial flutter and RUE DVT, now on IV heparin. Also w/ severe anemia. Required multiple transfusions. FOBT positive. Colonoscopy revealed a circumferential firm mass with friable surfaces a the hepatic flexure. Transferred to [facility] 10/06/14 for possible surgery.    Subjective: No complaints.  Objective: Vital signs in last 24 hours: Temp:  [98.2 F (36.8 C)-98.5 F (36.9 C)] 98.2 F (36.8 C) (02/18 0511) Pulse Rate:  [59-73] 73 (02/18 0808) Resp:  [16-18] 16 (02/18 0511) BP: (95-113)/(34-61) 113/54 mmHg (02/18 0808) SpO2:  [95 %-100 %] 95 % (02/18 0511) Weight:  [198 lb 3.1 oz (89.9 kg)-199 lb 8.3 oz (90.5 kg)] 198 lb 3.1 oz (89.9 kg) (02/18 0511) Last BM Date: 10/04/14  Intake/Output from previous day: 02/17 0701 - 02/18 0700 In: 812.4 [P.O.:238; I.V.:574.4] Out: 3350 [Urine:3350] Intake/Output this shift: Total I/O In: 720 [P.O.:720] Out: 575 [Urine:575]  Medications Current Facility-Administered Medications  Medication Dose Route Frequency Provider Last Rate Last Dose  . 0.9 %  sodium chloride infusion  250 mL Intravenous PRN Danie Binder, MD      . 0.9 %  sodium chloride infusion   Intravenous Once Saverio Danker, PA-C      . acetaminophen (TYLENOL) tablet 650 mg  650 mg Oral Q4H PRN Samuella Cota, MD      . alum & mag hydroxide-simeth (MAALOX/MYLANTA) 200-200-20 MG/5ML suspension 30 mL  30 mL Oral Q6H PRN Thurnell Lose, MD      . antiseptic oral rinse (CPC / CETYLPYRIDINIUM CHLORIDE 0.05%) solution 7 mL  7 mL Mouth Rinse BID [name], MD   7 mL at 10/07/14 1009  . atorvastatin (LIPITOR) tablet 20 mg  20 mg Oral q1800  Samuella Cota, MD   20 mg at 10/06/14 1735  . carvedilol (COREG) tablet 3.125 mg  3.125 mg Oral BID WC Satira Sark, MD   3.125 mg at 10/07/14 2979  . [START ON 10/08/2014] cefoTEtan (CEFOTAN) 2 g in dextrose 5 % 50 mL IVPB  2 g Intravenous On Call to [place], PA-C      . diphenhydrAMINE (BENADRYL) injection 25 mg  25 mg Intravenous Q6H PRN Thurnell Lose, MD      . feeding supplement (RESOURCE BREEZE) (RESOURCE BREEZE) liquid 1 Container  1 Container Oral BID BM Baird Lyons, RD   1 Container at 10/07/14 1115  . furosemide (LASIX) injection 40 mg  40 mg Intravenous Once Thurnell Lose, MD      . guaiFENesin-dextromethorphan (ROBITUSSIN DM) 100-10 MG/5ML syrup 5 mL  5 mL Oral Q4H PRN Thurnell Lose, MD      . heparin ADULT infusion 100 units/mL (25000 units/250 mL)  1,600 Units/hr Intravenous Continuous Saverio Danker, PA-C 16 mL/hr at 10/07/14 1006 1,600 Units/hr at 10/07/14 1006  . HYDROcodone-acetaminophen (NORCO/VICODIN) 5-325 MG per tablet 1-2 tablet  1-2 tablet Oral Q4H PRN Thurnell Lose, MD      . insulin aspart (novoLOG) injection 0-5 Units  0-5 Units Subcutaneous QHS Thurnell Lose, MD   0 Units at 10/06/14 2200  . insulin aspart (novoLOG) injection 0-9 Units  0-9 Units Subcutaneous TID WC Prashant Jennette Kettle,  MD   1 Units at 10/07/14 0650  . levothyroxine (SYNTHROID, LEVOTHROID) tablet 125 mcg  125 mcg Oral QAC breakfast Samuella Cota, MD   125 mcg at 10/07/14 [phone]  . morphine 2 MG/ML injection 2 mg  2 mg Intravenous Q3H PRN Thurnell Lose, MD      . mupirocin cream (BACTROBAN) 2 %   Topical BID [name], MD      . ondansetron [facility]) injection 4 mg  4 mg Intravenous Q6H PRN Samuella Cota, MD      . pantoprazole (PROTONIX) EC tablet 40 mg  40 mg Oral BID AC Danie Binder, MD   40 mg at 10/07/14 0800  . polyethylene glycol (MIRALAX / GLYCOLAX) packet 17 g  17 g Oral Daily Danie Binder, MD   17 g at 10/07/14 1007  . sodium chloride (OCEAN)  0.65 % nasal spray 2 spray  2 spray Each Nare QID Danie Binder, MD   2 spray at 10/07/14 [phone]  . sodium chloride 0.9 % injection 3 mL  3 mL Intravenous PRN Samuella Cota, MD   3 mL at 10/02/14 2031  . sodium chloride 0.9 % injection 3 mL  3 mL Intravenous Q12H Thurnell Lose, MD   3 mL at 10/07/14 1000  . torsemide (DEMADEX) tablet 30 mg  30 mg Oral Daily Thurnell Lose, MD   30 mg at 10/07/14 1007    PE: General appearance: alert, cooperative and no distress Neck: no carotid bruit and no JVD Lungs: clear to auscultation bilaterally Heart: regular rate and rhythm Extremities: trace - 1+ bilateral LEE Pulses: 2+ and symmetric Skin: warm and dry Neurologic: Grossly normal  Lab Results:   Recent Labs  10/05/14 0514 10/06/14 0519 10/07/14 0613  WBC 7.5 7.4 5.1  HGB 8.2* 7.9* 7.6*  HCT 27.9* 26.0* 25.3*  PLT 271 273 270   BMET  Recent Labs  10/05/14 0514 10/07/14 0613  NA 137 136  K 4.1 4.5  CL 90* 91*  CO2 37* 38*  GLUCOSE 134* 133*  BUN 83* 77*  CREATININE 2.38* 2.62*  CALCIUM 8.9 8.5     Assessment/Plan  Principal Problem:   Colonic mass Active Problems:   Anemia of chronic disease   Chronic renal insufficiency, stage III (moderate)   Abnormal EKG- known IVCD (LBBB type)   Fall   Acute respiratory failure with hypoxia   Atrial fibrillation/ atrilal flutter- new   Elevated troponin   Acute on chronic renal insufficiency   Hyperkalemia   Elevated LFTs   Acute combined systolic and diastolic congestive heart failure   Type 2 diabetes mellitus with renal manifestations   Cardiomyopathy-etiology undetermined- EF 40-45% by echo Jan 2015   Anemia due to GI blood loss   Protein-calorie malnutrition, severe   PUD (peptic ulcer disease)   Acute renal failure syndrome   New onset atrial fibrillation   1. Acute on Chronic Systolic CHF: EF reduced down to 20-25%: started on low dose dobutamine at [facility] by Dr. Bronson Ing and had good diuresis. Loss 32 lbs.  Now on oral torsemide, 60 mg daily.   2. Atrial Flutter: rate is controlled. Continue Coreg. Discontinue Heparin on call to surgery.   3. RUE DVT: on IV heparin.  Hgb will need to be carefully monitored, currently 7.6. He would likely require long-term anticoagulation for this, although this will need thoughtful consideration after GI mass is addressed.  4. Acute on Chronic Renal Failure: SCr  2.62 today. Baseline ~2.3. Continue diuresis and monitor. Avoid nephrotoxins, no ACE/ARB.  5. Colonic Mass: plan is for possible surgery which is high-risk based upon multiple comorbidities including severe LV dysfunction and concomitant heart failure.     LOS: 15 days    Deasha Clendenin M. Ladoris Gene 10/07/2014 12:24 PM

## 2014-10-07 NOTE — Progress Notes (Signed)
Patient ID: Greg [name], male   DOB: 1932-02-03, 81 y.o.   MRN: [id number] 3 Days Post-Op  Subjective: Pt feels ok today.  Hungry.  No CP or SOB.  No abdominal pain  Objective: Vital signs in last 24 hours: Temp:  [98.2 F (36.8 C)-98.5 F (36.9 C)] 98.2 F (36.8 C) (02/18 0511) Pulse Rate:  [59-73] 73 (02/18 0808) Resp:  [16-18] 16 (02/18 0511) BP: (95-113)/(34-61) 113/54 mmHg (02/18 0808) SpO2:  [95 %-100 %] 95 % (02/18 0511) Weight:  [198 lb 3.1 oz (89.9 kg)-199 lb 8.3 oz (90.5 kg)] 198 lb 3.1 oz (89.9 kg) (02/18 0511) Last BM Date: 10/04/14  Intake/Output from previous day: 02/17 0701 - 02/18 0700 In: 812.4 [P.O.:238; I.V.:574.4] Out: 3350 [Urine:3350] Intake/Output this shift:    PE: Abd: soft, NT, ND, +BS Lungs: CTAB with anterior auscultation Heart: irregular  Lab Results:   Recent Labs  10/06/14 0519 10/07/14 0613  WBC 7.4 5.1  HGB 7.9* 7.6*  HCT 26.0* 25.3*  PLT 273 270   BMET  Recent Labs  10/05/14 0514 10/07/14 0613  NA 137 136  K 4.1 4.5  CL 90* 91*  CO2 37* 38*  GLUCOSE 134* 133*  BUN 83* 77*  CREATININE 2.38* 2.62*  CALCIUM 8.9 8.5   PT/INR No results for input(s): LABPROT, INR in the last 72 hours. CMP     Component Value Date/Time   NA 136 10/07/2014 0613   K 4.5 10/07/2014 0613   CL 91* 10/07/2014 0613   CO2 38* 10/07/2014 0613   GLUCOSE 133* 10/07/2014 0613   BUN 77* 10/07/2014 0613   BUN 49* 10/12/2013   CREATININE 2.62* 10/07/2014 0613   CREATININE 2.01 10/12/2013   CREATININE 1.60* 12/01/2007 0855   CALCIUM 8.5 10/07/2014 0613   CALCIUM 8.0* 09/04/2013 1030   PROT 5.9* 10/02/2014 0550   ALBUMIN 2.8* 10/02/2014 0550   AST 34 10/02/2014 0550   AST 18 08/18/2013   ALT 58* 10/02/2014 0550   ALKPHOS 48 10/02/2014 0550   BILITOT 0.6 10/02/2014 0550   BILITOT 0.6 08/18/2013   GFRNONAA 22* 10/07/2014 0613   GFRAA 25* 10/07/2014 0613   Lipase  No results found for: LIPASE     Studies/Results: Ct Abdomen Pelvis Wo  Contrast  10/05/2014   CLINICAL DATA:  Known colonic mass on recent colonoscopy  EXAM: CT ABDOMEN AND PELVIS WITHOUT CONTRAST  TECHNIQUE: Multidetector CT imaging of the abdomen and pelvis was performed following the standard protocol without IV contrast.  COMPARISON:  09/02/2013  FINDINGS: Bilateral lower lobe consolidation with associated moderate effusions are seen. No parenchymal nodules are noted.  The gallbladder, spleen, adrenal glands and pancreas are normal in their CT appearance. The liver is predominately homogeneous in attenuation. A few vague areas of decreased attenuation are noted within the medial and lateral segment of the left lobe of the liver best seen on image number 21 of series 2. Given the clinical history the possibility of underlying metastatic disease could not be totally excluded. The kidneys are well visualized bilaterally and reveal no renal calculi or obstructive changes.  The previously described right lateral abdominal wall hernia has been rib reduced and repair. No residual hernia is seen. Some wall thickening is noted in the distal terminal ileum and also within the proximal ascending colon. These changes in the ascending colon may be related to the known underlying colonic mass. Scattered diverticular change is noted without diverticulitis. No significant pelvic mass lesion is seen. The bladder is  well distended. No sizable lymphadenopathy is seen. Degenerative changes of the lumbar spine are seen. No bony lesion is noted.  IMPRESSION: Bilateral moderate pleural effusions with associated lower lobe atelectasis/ consolidation.  Wall thickening in the ascending colon likely related to the patient's given clinical history of colonic mass.  Hypodensities within the liver of uncertain etiology. The possibility of metastatic disease cannot be totally excluded. Ultrasound of the liver may be helpful in this regard.  Diverticulosis without diverticulitis.  No other focal abnormality is  seen.   Electronically Signed   By: Inez Catalina M.D.   On: 10/05/2014 17:03   Dg Chest Port 1 View  10/07/2014   CLINICAL DATA:  Shortness of breath  EXAM: PORTABLE CHEST - 1 VIEW  COMPARISON:  09/22/2014  FINDINGS: Cardiac shadow remains enlarged. Bilateral pleural effusions are noted right greater than left. Given some variation in the patient positioning the overall appearance is stable. No new focal infiltrate is seen.  IMPRESSION: Stable effusions.  No new focal abnormality is noted.   Electronically Signed   By: Inez Catalina M.D.   On: 10/07/2014 07:56    Anti-infectives: Anti-infectives    Start     Dose/Rate Route Frequency Ordered Stop   10/08/14 0600  cefoTEtan (CEFOTAN) 2 g in dextrose 5 % 50 mL IVPB     2 g 100 mL/hr over 30 Minutes Intravenous On call to O.R. 10/07/14 6333 10/09/14 0559       Assessment/Plan  1. Hepatic flexure colonic adenocarcinoma, questionable liver mets 2. Anemia 3. CKD 4. CHF 5. DM 6. HTN 7. Cardiomyopathy EF 20-25%  Plan: 1. Cardiology has been consulted to see the patient.  Hopefully, he can be as tuned as possible prior to surgery.  He is clearly at at least moderate risk given his poor EF.   2. Transfuse 1 unit of pRBCs today given hgb <8.  Will have 2 units available for OR tomorrow if needed. 3. NPO p MN for OR tomorrow 4. Stop heparin drip at 0600am for OR tomorrow around 1000am. 5. The procedure has been discussed with the patient.  He understands that he will have his right colon resected with the possibility of an ileostomy. 6. I have discussed this plan with Dr. Candiss Norse as well. 7. Cr trending up to 2.6, but he has CKD.  Baseline 1 yr ago was between 1.7-2.0.  Primary service will look into this.  Check labs in am. 8. Agree with oncology evaluation at some point.  LOS: 15 days    Bonnetta Allbee E 10/07/2014, 8:09 AM Pager: [phone]

## 2014-10-07 NOTE — Progress Notes (Addendum)
Patient Demographics  Greg [name], is a 81 y.o. male, DOB - 13-Nov-1931, LEX:[id number]  Admit date - 09/22/2014   Admitting Physician Verlee Monte, MD  Outpatient Primary MD for the patient is [name], MD  LOS - 15   Chief Complaint  Patient presents with  . Fall      Brief summary  Greg [name] is a 81 y.o. male, With history of type 2 diabetes mellitus, chronic kidney disease stage III Baseline creatinine around 2.3, essential hypertension, sleep apnea, left bundle branch block, systolic heart failure EF now 20-25%, who [facility] which he complains of weakness and fall,he was found to have acute on chronic respiratory failure with acute on chronic systolic and diastolic CHF, new onset atrial fibrillation and anemia. He was seen by cardiology, he was initially placed on dobutamine drip along with diuretics for atrial fibrillation and CHF, he was also seen by GI for anemia workup and colonoscopy relieved colonic mass. He was thought to be high risk for surgery at [facility] and transferred here for further care. He also developed acute renal failure and renal was following over therefore medical treatment as well. Developed L arm DVT there as well.  Currently besides generalized weakness patient is symptom-free, he does condone to weight loss at home which was unintentional, he says he has not been out of the bed since he has been in the hospital for 2 weeks, denies any chest pain, no shortness of breath at this time, no blood in stool or urine, no personal or family history of colon cancers, he never had any colonoscopy previously.   Subjective:   Greg [name] today has, No headache, No chest pain, No abdominal pain - No Nausea, No new weakness tingling or  numbness, No Cough - SOB.    Assessment & Plan    1. 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 CCS called will require surgical resection on 10-07-14. Once surgery is completed we will involve oncology as well. He is a moderate to high risk for adverse cardiopulmonary outcome during perioperative period, this is agreeable to the patient and would like to proceed for surgery. Cardiology following.   2. Acute on chronic systolic heart failure EF now 25%. Continue present dose beta blocker and diuretic, he was initially on to between drip, cardiology has been consulted for further management.no ACE/ARB due to renal failure.   3. Atrial flutter new onset. Continue beta blocker for rate control, telemetry monitor, no anticoagulation due to #1 above.   4. DM type II. check A1c, sliding scale here.   5.ARF on chronic injury disease stage III. Baseline creatinine around 2.3. Continue diuresis and monitor. Avoid nephrotoxins, no ACE/ARB.   6. Severe protein calorie malnutrition. Nutritionist input after surgery.   7. Liver hyperdensities. Could be colonic metastasis. Oncology input after surgery   8.L arm DVT - Hep gtt.   Code Status:  Full  Family Communication: None   Disposition Plan: Stable   Procedures   Greg  - Procedure narrative: Transthoracic echocardiography. Imagequality was adequate. The study was technically difficult, as a result of poor patient compliance. - Left ventricle: The cavity size was normal. Systolic function wasseverely reduced.  The estimated ejection fraction was in the range of 20% to 25%. The study was not technically sufficient toallow evaluation of LV diastolic dysfunction due to atrialfibrillation. Doppler parameters are consistent with high ventricular filling pressure. Mild posterior wall with severe asymmetric septal hypertrophy (2.16 cm). - Ventricular septum: Septal motion showed abnormal function and dyssynergy.  These changes are consistent with a left bundle branch block. - Aortic valve: Mildly calcified annulus. Trileaflet; mildlythickened leaflets. There was trivial regurgitation. - Mitral valve: Mildly thickened annulus. Mildly thickened leaflets. There was mild regurgitation. - Left atrium: The atrium was severely dilated. Volume/bsa, ES, (1-plane Simpson&'s, A2C): 42.6 ml/m^2. - Right ventricle: The cavity size was mildly dilated. Systolicfunction was mildly reduced. - Right atrium: The atrium was mildly to moderately dilated. - Tricuspid valve: There was mild regurgitation. - Pulmonary arteries: PA peak pressure: 50 mm Hg (S). Moderatelyelevated pulmonary pressures. - Systemic veins: IVC is dilated with normal respiratory variation. Estimated CVP 8 mmHg.  Impressions:  - When compared to report dated 8/67/61, LV systolic function has significantly declined.  Consults  Cards, CCS   Medications  Scheduled Meds: . sodium chloride   Intravenous Once  . antiseptic oral rinse  7 mL Mouth Rinse BID  . atorvastatin  20 mg Oral q1800  . carvedilol  3.125 mg Oral BID WC  . [START ON 10/08/2014] cefoTEtan (CEFOTAN) IV  2 g Intravenous On Call to OR  . feeding supplement (RESOURCE BREEZE)  1 Container Oral BID BM  . furosemide  40 mg Intravenous Once  . insulin aspart  0-5 Units Subcutaneous QHS  . insulin aspart  0-9 Units Subcutaneous TID WC  . levothyroxine  125 mcg Oral QAC breakfast  . mupirocin cream   Topical BID  . pantoprazole  40 mg Oral BID AC  . polyethylene glycol  17 g Oral Daily  . sodium chloride  2 spray Each Nare QID  . torsemide  30 mg Oral Daily   Continuous Infusions: . heparin 1,600 Units/hr (10/07/14 1006)   PRN Meds:.sodium chloride, acetaminophen, alum & mag hydroxide-simeth, diphenhydrAMINE, guaiFENesin-dextromethorphan, HYDROcodone-acetaminophen, morphine injection, ondansetron (ZOFRAN) IV, sodium chloride  DVT Prophylaxis    SCDs    Lab Results    Component Value Date   PLT 270 10/07/2014    Antibiotics     Anti-infectives    Start     Dose/Rate Route Frequency Ordered Stop   10/08/14 0600  cefoTEtan (CEFOTAN) 2 g in dextrose 5 % 50 mL IVPB     2 g 100 mL/hr over 30 Minutes Intravenous On call to O.R. 10/07/14 0806 10/09/14 0559          Objective:   Filed Vitals:   10/06/14 2040 10/07/14 0026 10/07/14 0511 10/07/14 0808  BP: 109/61 95/48 104/47 113/54  Pulse: 63 73 59 73  Temp: 98.5 F (36.9 C) 98.5 F (36.9 C) 98.2 F (36.8 C)   TempSrc: Oral Oral Oral   Resp: 16 18 16    Height:      Weight:   89.9 kg (198 lb 3.1 oz)   SpO2: 96% 100% 95%     Wt Readings from Last 3 Encounters:  10/07/14 89.9 kg (198 lb 3.1 oz)  10/02/13 112.22 kg (247 lb 6.4 oz)  09/15/13 124.1 kg (273 lb 9.5 oz)     Intake/Output Summary (Last 24 hours) at 10/07/14 1249 Last data filed at 10/07/14 1058  Gross per 24 hour  Intake 1532.4 ml  Output   3925 ml  Net -  2392.6 ml     Physical Exam  Awake Alert, Oriented X 3, No new F.N deficits, Normal affect [place].AT,PERRAL Supple Neck,No JVD, No cervical lymphadenopathy appriciated.  Symmetrical Chest wall movement, Good air movement bilaterally, CTAB iRRR,No Gallops,Rubs or new Murmurs, No Parasternal Heave +ve B.Sounds, Abd Soft, No tenderness, No organomegaly appriciated, No rebound - guarding or rigidity. No Cyanosis, Clubbing or edema, No new Rash or bruise     Data Review   Micro Results Recent Results (from the past 240 hour(s))  Clostridium Difficile by PCR     Status: None   Collection Time: 09/30/14  1:55 PM  Result Value Ref Range Status   C difficile by pcr NEGATIVE NEGATIVE Final    Radiology Reports Ct Abdomen Pelvis Wo Contrast  10/05/2014   CLINICAL DATA:  Known colonic mass on recent colonoscopy  EXAM: CT ABDOMEN AND PELVIS WITHOUT CONTRAST  TECHNIQUE: Multidetector CT imaging of the abdomen and pelvis was performed following the standard protocol without IV  contrast.  COMPARISON:  09/02/2013  FINDINGS: Bilateral lower lobe consolidation with associated moderate effusions are seen. No parenchymal nodules are noted.  The gallbladder, spleen, adrenal glands and pancreas are normal in their CT appearance. The liver is predominately homogeneous in attenuation. A few vague areas of decreased attenuation are noted within the medial and lateral segment of the left lobe of the liver best seen on image number 21 of series 2. Given the clinical history the possibility of underlying metastatic disease could not be totally excluded. The kidneys are well visualized bilaterally and reveal no renal calculi or obstructive changes.  The previously described right lateral abdominal wall hernia has been rib reduced and repair. No residual hernia is seen. Some wall thickening is noted in the distal terminal ileum and also within the proximal ascending colon. These changes in the ascending colon may be related to the known underlying colonic mass. Scattered diverticular change is noted without diverticulitis. No significant pelvic mass lesion is seen. The bladder is well distended. No sizable lymphadenopathy is seen. Degenerative changes of the lumbar spine are seen. No bony lesion is noted.  IMPRESSION: Bilateral moderate pleural effusions with associated lower lobe atelectasis/ consolidation.  Wall thickening in the ascending colon likely related to the patient's given clinical history of colonic mass.  Hypodensities within the liver of uncertain etiology. The possibility of metastatic disease cannot be totally excluded. Ultrasound of the liver may be helpful in this regard.  Diverticulosis without diverticulitis.  No other focal abnormality is seen.   Electronically Signed   By: Inez Catalina M.D.   On: 10/05/2014 17:03   Dg Chest 2 View  09/22/2014   CLINICAL DATA:  Left anterior shoulder abrasion, Fall.  EXAM: CHEST  2 VIEW  COMPARISON:  09/02/2013  FINDINGS: There is a small to  moderate right pleural effusion. Right lower lobe atelectasis or infiltrate. No confluent opacity on the left. Cardiomegaly with vascular congestion. No acute bony abnormality.  IMPRESSION: Small to moderate right pleural effusion with right lower lobe atelectasis or consolidation.  Cardiomegaly, vascular congestion.   Electronically Signed   By: Rolm Baptise M.D.   On: 09/22/2014 08:20   Ct Head Wo Contrast  09/22/2014   CLINICAL DATA:  81 year old male with dizziness and fall this morning when going to the bathroom. Left side head and year injury. Labored breathing. Initial encounter.  EXAM: CT HEAD WITHOUT CONTRAST  TECHNIQUE: Contiguous axial images were obtained from the base of the skull through the  vertex without intravenous contrast.  COMPARISON:  None.  FINDINGS: Chronic paranasal sinus surgery and changes of chronic sinusitis. Mastoids are clear.  No visible orbit or scalp soft tissue injury identified. No acute osseous abnormality identified.  Scattered areas of chronic appearing encephalomalacia in the left MCA territory. Chronic lacunar infarct in the left thalamus. Mild ex vacuo enlargement of the left lateral ventricle. No midline shift, mass effect, or evidence of intracranial mass lesion. No acute intracranial hemorrhage identified. No evidence of cortically based acute infarction identified. No suspicious intracranial vascular hyperdensity. Possible small chronic lacunar infarcts in the cerebellar tonsils.  IMPRESSION: 1. No acute intracranial abnormality. No acute traumatic injury identified. 2. Chronic small and medium-sized vessel ischemia, mostly in the left hemisphere.   Electronically Signed   By: Lars Pinks M.D.   On: 09/22/2014 08:11   US Venous Img Upper Uni Right  09/23/2014   CLINICAL DATA:  Right upper extremity edema, pain  EXAM: RIGHT UPPER EXTREMITY VENOUS DOPPLER ULTRASOUND  TECHNIQUE: Gray-scale sonography with graded compression, as well as color Doppler and duplex ultrasound  were performed to evaluate the upper extremity deep venous system from the level of the subclavian vein and including the jugular, axillary, basilic, radial, ulnar and upper cephalic vein. Spectral Doppler was utilized to evaluate flow at rest and with distal augmentation maneuvers.  COMPARISON:  None.  FINDINGS: Contralateral Subclavian Vein: Respiratory phasicity is normal and symmetric with the symptomatic side. No evidence of thrombus. Normal compressibility.  Internal Jugular Vein: No evidence of thrombus. Normal compressibility, respiratory phasicity and response to augmentation.  Subclavian Vein: No evidence of thrombus. Normal compressibility, respiratory phasicity and response to augmentation.  Axillary Vein: Intraluminal hypoechoic thrombus present in the right axillary vein appearing occlusive. Axillary vein is noncompressible.  Cephalic Vein: No evidence of thrombus. Normal compressibility, respiratory phasicity and response to augmentation.  Basilic Vein: No evidence of thrombus. Normal compressibility, respiratory phasicity and response to augmentation.  Brachial Veins: Axillary thrombus extends into the brachial vein which is also occlusive and noncompressible. Brachial vein is duplicated.  Radial Veins: No evidence of thrombus. Normal compressibility, respiratory phasicity and response to augmentation.  Ulnar Veins: No evidence of thrombus. Normal compressibility, respiratory phasicity and response to augmentation.  Venous Reflux:  None visualized.  Other Findings:  Subcutaneous for arm edema evident.  IMPRESSION: Positive exam for right upper extremity axillary and brachial acute occlusive DVT.  These results will be called to the ordering clinician or representative by the Radiology Department at the imaging location.   Electronically Signed   By: Daryll Brod M.D.   On: 09/23/2014 16:02   Dg Chest Port 1 View  10/07/2014   CLINICAL DATA:  Shortness of breath  EXAM: PORTABLE CHEST - 1 VIEW   COMPARISON:  09/22/2014  FINDINGS: Cardiac shadow remains enlarged. Bilateral pleural effusions are noted right greater than left. Given some variation in the patient positioning the overall appearance is stable. No new focal infiltrate is seen.  IMPRESSION: Stable effusions.  No new focal abnormality is noted.   Electronically Signed   By: Inez Catalina M.D.   On: 10/07/2014 07:56   Dg Shoulder Left  09/22/2014   CLINICAL DATA:  Fall last night with abrasion and injury to left shoulder. Initial encounter.  EXAM: LEFT SHOULDER - 2+ VIEW  COMPARISON:  None.  FINDINGS: No acute fracture or dislocation identified. There are mild degenerative changes involving the [facility] joint and glenohumeral joint. No bony lesions or destruction.  IMPRESSION:  No acute fracture identified.   Electronically Signed   By: Aletta Edouard M.D.   On: 09/22/2014 08:18     CBC  Recent Labs Lab 10/01/14 0413  10/03/14 0946 10/04/14 0519 10/05/14 0514 10/06/14 0519 10/07/14 0613  WBC 7.9  < > 7.0 6.7 7.5 7.4 5.1  HGB 8.3*  < > 8.4* 7.6* 8.2* 7.9* 7.6*  HCT 28.8*  < > 28.3* 25.5* 27.9* 26.0* 25.3*  PLT 196  < > 220 245 271 273 270  MCV 94.4  < > 93.1 93.8 94.6 93.9 92.7  MCH 27.2  < > 27.6 27.9 27.8 28.5 27.8  MCHC 28.8*  < > 29.7* 29.8* 29.4* 30.4 30.0  RDW 18.6*  < > 18.7* 18.9* 18.8* 19.3* 19.3*  LYMPHSABS 1.5  --   --   --   --   --   --   MONOABS 0.8  --   --   --   --   --   --   EOSABS 0.2  --   --   --   --   --   --   BASOSABS 0.0  --   --   --   --   --   --   < > = values in this interval not displayed.  Chemistries   Recent Labs Lab 10/02/14 0550 10/03/14 0510 10/04/14 0519 10/05/14 0514 10/07/14 0613  NA 137 137 138 137 136  K 4.2 4.3 4.4 4.1 4.5  CL 88* 89* 88* 90* 91*  CO2 37* 40* 39* 37* 38*  GLUCOSE 129* 130* 124* 134* 133*  BUN 94* 89* 90* 83* 77*  CREATININE 2.45* 2.44* 2.38* 2.38* 2.62*  CALCIUM 8.8 8.7 8.8 8.9 8.5  AST 34  --   --   --   --   ALT 58*  --   --   --   --   ALKPHOS 48   --   --   --   --   BILITOT 0.6  --   --   --   --    ------------------------------------------------------------------------------------------------------------------ estimated creatinine clearance is 24 mL/min (by C-G formula based on Cr of 2.62). ------------------------------------------------------------------------------------------------------------------  Recent Labs  10/06/14 1841  HGBA1C 6.5*   ------------------------------------------------------------------------------------------------------------------ No results for input(s): CHOL, HDL, LDLCALC, TRIG, CHOLHDL, LDLDIRECT in the last 72 hours. ------------------------------------------------------------------------------------------------------------------ No results for input(s): TSH, T4TOTAL, T3FREE, THYROIDAB in the last 72 hours.  Invalid input(s): FREET3 ------------------------------------------------------------------------------------------------------------------ No results for input(s): VITAMINB12, FOLATE, FERRITIN, TIBC, IRON, RETICCTPCT in the last 72 hours.  Coagulation profile No results for input(s): INR, PROTIME in the last 168 hours.  No results for input(s): DDIMER in the last 72 hours.  Cardiac Enzymes No results for input(s): CKMB, TROPONINI, MYOGLOBIN in the last 168 hours.  Invalid input(s): CK ------------------------------------------------------------------------------------------------------------------ Invalid input(s): POCBNP     Time Spent in minutes   35   Marigold Mom K M.D on 10/07/2014 at 12:49 PM  Between 7am to 7pm - Pager - [phone]  After 7pm go to [email or web address] - [facility] Office  [phone]

## 2014-10-07 NOTE — Progress Notes (Signed)
[place] for Heparin Indication: atrial fibrillation and DVT in RUE  Allergies  Allergen Reactions  . Celery Oil     vomiting   Patient Measurements: Height: 5\' 11"  (180.3 cm) Weight: 198 lb 3.1 oz (89.9 kg) IBW/kg (Calculated) : 75.3 Heparin Dosing Weight: 90 kg  Vital Signs: Temp: 98.2 F (36.8 C) (02/18 0511) Temp Source: Oral (02/18 0511) BP: 113/54 mmHg (02/18 0808) Pulse Rate: 73 (02/18 0808)  Labs:  Recent Labs  10/05/14 0514 10/05/14 1638 10/06/14 0519 10/07/14 0613  HGB 8.2*  --  7.9* 7.6*  HCT 27.9*  --  26.0* 25.3*  PLT 271  --  273 270  HEPARINUNFRC  --  0.27* 0.48 0.57  CREATININE 2.38*  --   --  2.62*   Estimated Creatinine Clearance: 24 mL/min (by C-G formula based on Cr of 2.62).  Assessment: [name] admitted with new onset Afib & transfusion dependent anemia, FOBT x3 as outpatient.  Anticoagulation was initially held pending GI work-up.  Venous doppler ultrasound on 09/23/14 was positive for right upper extremity axillary and brachial acute occlusive DVT and pharmacy was asked to initiate heparin.  Heparin was held for EGD and colonoscopy on 10/04/14 & resumed post procedure. Nearly obstructing colon CA per colonoscopy.  Transferred from [facility] to [facility] on 2/17 for high risk surgery on 10/07/14. Heparin level is therapeutic (0.57) on 1600 units/hr. Hgb 7.6, for 1 unit PRBCx today.  No bleeding noted. Heparin to stop at 4am on 2/18 for OR at 10am.  Goal of Therapy:  Heparin level 0.3-0.7 units/ml Monitor platelets by anticoagulation protocol: Yes   Plan:  Continue heparin infusion at 1600 units/hr. Stop heparin at 4am on 10/07/14 for OR at 10am.  RN aware. Daily heparin level & CBC while on heparin Will follow up post-op for anticoagulation plan.  Arty Baumgartner, [place] Pager: [phone] 10/07/2014,12:46 PM

## 2014-10-07 NOTE — Progress Notes (Signed)
NUTRITION FOLLOW-UP  INTERVENTION: D/C Ensure Complete   Provide Resource Breeze BID, each provides 250 kcal, 9 grams of protein, and 197 ml of H20  NUTRITION DIAGNOSIS: Inadequate oral intake related to reduced appetite as evidenced by loss of 27 pounds in 1 month and meeting an estimated 50% of energy needs for past few months; ongoing  Goal: Pt to meet >/= 90% of their estimated nutrition needs; ongoing  Monitor:  Diet advancement, post-op status, weight, labs  ASSESSMENT: Interval hx: Main issue is CHF. Pt now 30 pounds lighter as much fluid has been taken off. Changed energy needs to reflect this. Eating 50% of meals.   Was seen by GI for anemia workup and colonoscopy revealed colonic mass. He was thought to be high risk surgery at [facility]; transferred to [facility] for further care. Also developed acute renal failure. Currently NPO with clear liquids for planned extended right colectomy with possible ileostomy 2/19. Once surgery is complete, oncology will be involved.   Labs and medications- low chloride; high CO2, BUN, creatinine, glucose, CBGs  Family at bedside upon assessment. Pt has been enjoying Ensure Complete; however, on clear liquid diet until midnight. Will D/C Ensure at this time. Forensic psychologist. Follow-up post-op.   Height: Ht Readings from Last 1 Encounters:  10/06/14 5\' 11"  (1.803 m)    Weight: Wt Readings from Last 1 Encounters:  10/07/14 198 lb 3.1 oz (89.9 kg)    BMI:  Body mass index is 27.65 kg/(m^2). overweight   Estimated Nutritional Needs: Kcal: 2000-2150 Protein: 120-135 grams Fluid: per MD  Skin: Weeping/ecchymosis, skin tears/wounds  Diet Order: NPO time specified   Intake/Output Summary (Last 24 hours) at 10/07/14 0927 Last data filed at 10/07/14 0901  Gross per 24 hour  Intake 1532.4 ml  Output   3350 ml  Net -1817.6 ml    Last BM: 2/11  Labs:   Recent Labs Lab 10/04/14 0519 10/05/14 0514 10/07/14 0613  NA 138 137  136  K 4.4 4.1 4.5  CL 88* 90* 91*  CO2 39* 37* 38*  BUN 90* 83* 77*  CREATININE 2.38* 2.38* 2.62*  CALCIUM 8.8 8.9 8.5  GLUCOSE 124* 134* 133*    CBG (last 3)   Recent Labs  10/06/14 2104 10/07/14 0619  GLUCAP 183* 135*    Scheduled Meds: . sodium chloride   Intravenous Once  . antiseptic oral rinse  7 mL Mouth Rinse BID  . atorvastatin  20 mg Oral q1800  . carvedilol  3.125 mg Oral BID WC  . [START ON 10/08/2014] cefoTEtan (CEFOTAN) IV  2 g Intravenous On Call to OR  . feeding supplement (ENSURE COMPLETE)  237 mL Oral BID BM  . furosemide  40 mg Intravenous Once  . insulin aspart  0-5 Units Subcutaneous QHS  . insulin aspart  0-9 Units Subcutaneous TID WC  . levothyroxine  125 mcg Oral QAC breakfast  . mupirocin cream   Topical BID  . pantoprazole  40 mg Oral BID AC  . polyethylene glycol  17 g Oral Daily  . sodium chloride  2 spray Each Nare QID  . sodium chloride  3 mL Intravenous Q12H  . torsemide  30 mg Oral Daily    Continuous Infusions: . heparin      Wynona Dove, MS Dietetic Intern Pager: [phone]

## 2014-10-08 ENCOUNTER — Encounter (HOSPITAL_COMMUNITY)
Admission: EM | Disposition: A | Discharge status: Skilled Nursing Facility | Payer: Self-pay | Source: Home / Self Care | Attending: Pulmonary Disease

## 2014-10-08 ENCOUNTER — Inpatient Hospital Stay (HOSPITAL_COMMUNITY): Payer: Medicare Other | Admitting: Anesthesiology

## 2014-10-08 ENCOUNTER — Inpatient Hospital Stay (HOSPITAL_COMMUNITY): Payer: Medicare Other

## 2014-10-08 DIAGNOSIS — J95821 Acute postprocedural respiratory failure: Secondary | ICD-10-CM

## 2014-10-08 DIAGNOSIS — I959 Hypotension, unspecified: Secondary | ICD-10-CM

## 2014-10-08 HISTORY — PX: LAPAROTOMY: SHX154

## 2014-10-08 HISTORY — PX: PARTIAL COLECTOMY: SHX5273

## 2014-10-08 LAB — BASIC METABOLIC PANEL
ANION GAP: 4 — AB (ref 5–15)
Anion gap: 12 (ref 5–15)
BUN: 71 mg/dL — ABNORMAL HIGH (ref 6–23)
BUN: 68 mg/dL — AB (ref 6–23)
CHLORIDE: 93 mmol/L — AB (ref 96–112)
CO2: 38 mmol/L — AB (ref 19–32)
CO2: 30 mmol/L (ref 19–32)
Calcium: 8.8 mg/dL (ref 8.4–10.5)
Calcium: 9.2 mg/dL (ref 8.4–10.5)
Chloride: 95 mmol/L — ABNORMAL LOW (ref 96–112)
Creatinine, Ser: 2.6 mg/dL — ABNORMAL HIGH (ref 0.50–1.35)
Creatinine, Ser: 2.62 mg/dL — ABNORMAL HIGH (ref 0.50–1.35)
GFR calc Af Amer: 25 mL/min — ABNORMAL LOW (ref >90)
GFR calc Af Amer: 25 mL/min — ABNORMAL LOW (ref >90)
GFR calc non Af Amer: 22 mL/min — ABNORMAL LOW (ref >90)
GFR calc non Af Amer: 22 mL/min — ABNORMAL LOW (ref >90)
GLUCOSE: 187 mg/dL — AB (ref 70–99)
Glucose, Bld: 133 mg/dL — ABNORMAL HIGH (ref 70–99)
POTASSIUM: 4.2 mmol/L (ref 3.5–5.1)
POTASSIUM: 4 mmol/L (ref 3.5–5.1)
SODIUM: 135 mmol/L (ref 135–145)
Sodium: 137 mmol/L (ref 135–145)

## 2014-10-08 LAB — GLUCOSE, CAPILLARY
GLUCOSE-CAPILLARY: 130 mg/dL — AB (ref 70–99)
GLUCOSE-CAPILLARY: 194 mg/dL — AB (ref 70–99)
Glucose-Capillary: 140 mg/dL — ABNORMAL HIGH (ref 70–99)
Glucose-Capillary: 183 mg/dL — ABNORMAL HIGH (ref 70–99)
Glucose-Capillary: 184 mg/dL — ABNORMAL HIGH (ref 70–99)
Glucose-Capillary: 170 mg/dL — ABNORMAL HIGH (ref 70–99)

## 2014-10-08 LAB — POCT I-STAT 7, (LYTES, BLD GAS, ICA,H+H)
ACID-BASE EXCESS: 17 mmol/L — AB (ref 0.0–2.0)
BICARBONATE: 41.2 meq/L — AB (ref 20.0–24.0)
Calcium, Ion: 1.19 mmol/L (ref 1.13–1.30)
HCT: 27 % — ABNORMAL LOW (ref 39.0–52.0)
Hemoglobin: 9.2 g/dL — ABNORMAL LOW (ref 13.0–17.0)
O2 SAT: 100 %
POTASSIUM: 4.4 mmol/L (ref 3.5–5.1)
Patient temperature: 36.5
SODIUM: 133 mmol/L — AB (ref 135–145)
TCO2: 43 mmol/L (ref 0–100)
pCO2 arterial: 45.2 mmHg — ABNORMAL HIGH (ref 35.0–45.0)
pH, Arterial: 7.566 — ABNORMAL HIGH (ref 7.350–7.450)
pO2, Arterial: 375 mmHg — ABNORMAL HIGH (ref 80.0–100.0)

## 2014-10-08 LAB — CBC
HCT: 28.7 % — ABNORMAL LOW (ref 39.0–52.0)
HEMATOCRIT: 28 % — AB (ref 39.0–52.0)
Hemoglobin: 8.8 g/dL — ABNORMAL LOW (ref 13.0–17.0)
Hemoglobin: 8.8 g/dL — ABNORMAL LOW (ref 13.0–17.0)
MCH: 27.8 pg (ref 26.0–34.0)
MCH: 28.4 pg (ref 26.0–34.0)
MCHC: 30.7 g/dL (ref 30.0–36.0)
MCHC: 31.4 g/dL (ref 30.0–36.0)
MCV: 90.5 fL (ref 78.0–100.0)
MCV: 90.3 fL (ref 78.0–100.0)
PLATELETS: 310 10*3/uL (ref 150–400)
Platelets: 283 10*3/uL (ref 150–400)
RBC: 3.17 MIL/uL — AB (ref 4.22–5.81)
RBC: 3.1 MIL/uL — ABNORMAL LOW (ref 4.22–5.81)
RDW: 18.9 % — ABNORMAL HIGH (ref 11.5–15.5)
RDW: 18.8 % — ABNORMAL HIGH (ref 11.5–15.5)
WBC: 5.3 10*3/uL (ref 4.0–10.5)
WBC: 6.3 10*3/uL (ref 4.0–10.5)

## 2014-10-08 LAB — POCT I-STAT 3, ART BLOOD GAS (G3+)
Acid-Base Excess: 7 mmol/L — ABNORMAL HIGH (ref 0.0–2.0)
Bicarbonate: 31.4 mEq/L — ABNORMAL HIGH (ref 20.0–24.0)
O2 Saturation: 98 %
PH ART: 7.467 — AB (ref 7.350–7.450)
PO2 ART: 98 mmHg (ref 80.0–100.0)
Patient temperature: 97.4
TCO2: 33 mmol/L (ref 0–100)
pCO2 arterial: 43.2 mmHg (ref 35.0–45.0)

## 2014-10-08 LAB — SURGICAL PCR SCREEN
MRSA, PCR: NEGATIVE
STAPHYLOCOCCUS AUREUS: NEGATIVE

## 2014-10-08 LAB — HEPARIN LEVEL (UNFRACTIONATED): HEPARIN UNFRACTIONATED: 0.13 [IU]/mL — AB (ref 0.30–0.70)

## 2014-10-08 LAB — TRIGLYCERIDES: TRIGLYCERIDES: 80 mg/dL (ref <150)

## 2014-10-08 SURGERY — LAPAROTOMY, EXPLORATORY
Anesthesia: General | Site: Abdomen

## 2014-10-08 MED ORDER — LACTATED RINGERS IV SOLN
INTRAVENOUS | Status: DC
  Administered 2014-10-08: 16:00:00 via INTRAVENOUS

## 2014-10-08 MED ORDER — LEVOTHYROXINE SODIUM 100 MCG IV SOLR
62.5000 ug | Freq: Every day | INTRAVENOUS | Status: DC
  Administered 2014-10-09 – 2014-10-10 (×2): 62.5 ug via INTRAVENOUS
  Filled 2014-10-08 (×4): qty 5

## 2014-10-08 MED ORDER — AMIODARONE HCL IN DEXTROSE 360-4.14 MG/200ML-% IV SOLN
60.0000 mg/h | INTRAVENOUS | Status: AC
  Administered 2014-10-08: 60 mg/h via INTRAVENOUS
  Filled 2014-10-08: qty 200

## 2014-10-08 MED ORDER — MILRINONE IN DEXTROSE 20 MG/100ML IV SOLN
INTRAVENOUS | Status: DC | PRN
  Administered 2014-10-08: .375 mg/kg/min via INTRAVENOUS

## 2014-10-08 MED ORDER — PHENYLEPHRINE HCL 10 MG/ML IJ SOLN
INTRAMUSCULAR | Status: DC | PRN
  Administered 2014-10-08 (×2): 80 ug via INTRAVENOUS

## 2014-10-08 MED ORDER — ROCURONIUM BROMIDE 100 MG/10ML IV SOLN
INTRAVENOUS | Status: DC | PRN
  Administered 2014-10-08: 30 mg via INTRAVENOUS
  Administered 2014-10-08: 20 mg via INTRAVENOUS

## 2014-10-08 MED ORDER — EPHEDRINE SULFATE 50 MG/ML IJ SOLN
INTRAMUSCULAR | Status: AC
  Filled 2014-10-08: qty 1

## 2014-10-08 MED ORDER — ERTAPENEM SODIUM 1 G IJ SOLR
1.0000 g | INTRAMUSCULAR | Status: AC
  Administered 2014-10-08: 1 g via INTRAVENOUS
  Filled 2014-10-08: qty 1

## 2014-10-08 MED ORDER — PANTOPRAZOLE SODIUM 40 MG IV SOLR
40.0000 mg | Freq: Two times a day (BID) | INTRAVENOUS | Status: DC
  Administered 2014-10-08 – 2014-10-11 (×7): 40 mg via INTRAVENOUS
  Filled 2014-10-08 (×10): qty 40

## 2014-10-08 MED ORDER — STERILE WATER FOR INJECTION IJ SOLN
INTRAMUSCULAR | Status: AC
  Filled 2014-10-08: qty 10

## 2014-10-08 MED ORDER — AMIODARONE HCL IN DEXTROSE 360-4.14 MG/200ML-% IV SOLN
30.0000 mg/h | INTRAVENOUS | Status: DC
  Filled 2014-10-08: qty 200

## 2014-10-08 MED ORDER — 0.9 % SODIUM CHLORIDE (POUR BTL) OPTIME
TOPICAL | Status: DC | PRN
  Administered 2014-10-08 (×4): 1000 mL

## 2014-10-08 MED ORDER — CHLORHEXIDINE GLUCONATE 0.12 % MT SOLN
15.0000 mL | Freq: Two times a day (BID) | OROMUCOSAL | Status: DC
  Administered 2014-10-08 – 2014-10-10 (×5): 15 mL via OROMUCOSAL
  Filled 2014-10-08 (×4): qty 15

## 2014-10-08 MED ORDER — NOREPINEPHRINE BITARTRATE 1 MG/ML IV SOLN
0.0000 ug/min | INTRAVENOUS | Status: AC
  Administered 2014-10-08: 4 ug/min via INTRAVENOUS
  Filled 2014-10-08: qty 4

## 2014-10-08 MED ORDER — ONDANSETRON HCL 4 MG/2ML IJ SOLN
INTRAMUSCULAR | Status: AC
  Filled 2014-10-08: qty 2

## 2014-10-08 MED ORDER — EPHEDRINE SULFATE 50 MG/ML IJ SOLN
INTRAMUSCULAR | Status: DC | PRN
  Administered 2014-10-08: 10 mg via INTRAVENOUS

## 2014-10-08 MED ORDER — HEPARIN (PORCINE) IN NACL 100-0.45 UNIT/ML-% IJ SOLN
1600.0000 [IU]/h | INTRAMUSCULAR | Status: DC
  Administered 2014-10-09 (×2): 1600 [IU]/h via INTRAVENOUS
  Filled 2014-10-08 (×3): qty 250

## 2014-10-08 MED ORDER — CETYLPYRIDINIUM CHLORIDE 0.05 % MT LIQD
7.0000 mL | Freq: Four times a day (QID) | OROMUCOSAL | Status: DC
  Administered 2014-10-08 – 2014-10-13 (×19): 7 mL via OROMUCOSAL

## 2014-10-08 MED ORDER — AMIODARONE HCL IN DEXTROSE 360-4.14 MG/200ML-% IV SOLN
30.0000 mg/h | INTRAVENOUS | Status: DC
  Administered 2014-10-08 – 2014-10-12 (×7): 30 mg/h via INTRAVENOUS
  Filled 2014-10-08 (×14): qty 200

## 2014-10-08 MED ORDER — HEPARIN (PORCINE) IN NACL 100-0.45 UNIT/ML-% IJ SOLN
1600.0000 [IU]/h | INTRAMUSCULAR | Status: DC
  Filled 2014-10-08: qty 250

## 2014-10-08 MED ORDER — MILRINONE IN DEXTROSE 20 MG/100ML IV SOLN
0.1250 ug/kg/min | INTRAVENOUS | Status: DC
  Filled 2014-10-08: qty 100

## 2014-10-08 MED ORDER — ETOMIDATE 2 MG/ML IV SOLN
INTRAVENOUS | Status: DC | PRN
  Administered 2014-10-08: 10 mg via INTRAVENOUS

## 2014-10-08 MED ORDER — MUPIROCIN 2 % EX OINT
TOPICAL_OINTMENT | CUTANEOUS | Status: AC
  Administered 2014-10-08: 10:00:00
  Filled 2014-10-08: qty 22

## 2014-10-08 MED ORDER — LIDOCAINE HCL (CARDIAC) 20 MG/ML IV SOLN
INTRAVENOUS | Status: DC | PRN
  Administered 2014-10-08: 70 mg via INTRAVENOUS

## 2014-10-08 MED ORDER — PROPOFOL INFUSION 10 MG/ML OPTIME
INTRAVENOUS | Status: DC | PRN
  Administered 2014-10-08: 50 ug/kg/min via INTRAVENOUS

## 2014-10-08 MED ORDER — FENTANYL CITRATE 0.05 MG/ML IJ SOLN
INTRAMUSCULAR | Status: AC
  Filled 2014-10-08: qty 5

## 2014-10-08 MED ORDER — ARTIFICIAL TEARS OP OINT
TOPICAL_OINTMENT | OPHTHALMIC | Status: AC
  Filled 2014-10-08: qty 3.5

## 2014-10-08 MED ORDER — PROPOFOL 10 MG/ML IV EMUL
0.0000 ug/kg/min | INTRAVENOUS | Status: DC
  Administered 2014-10-08: 40 ug/kg/min via INTRAVENOUS
  Administered 2014-10-08: 40.256 ug/kg/min via INTRAVENOUS
  Administered 2014-10-09: 30 ug/kg/min via INTRAVENOUS
  Filled 2014-10-08 (×3): qty 100

## 2014-10-08 MED ORDER — SODIUM CHLORIDE 0.9 % IV SOLN
INTRAVENOUS | Status: DC
  Administered 2014-10-08 – 2014-10-11 (×2): via INTRAVENOUS

## 2014-10-08 MED ORDER — CALCIUM CHLORIDE 10 % IV SOLN
INTRAVENOUS | Status: DC | PRN
  Administered 2014-10-08: 20 mg via INTRAVENOUS

## 2014-10-08 MED ORDER — ALBUMIN HUMAN 5 % IV SOLN
INTRAVENOUS | Status: DC | PRN
  Administered 2014-10-08: 12:00:00 via INTRAVENOUS

## 2014-10-08 MED ORDER — AMIODARONE HCL IN DEXTROSE 360-4.14 MG/200ML-% IV SOLN
60.0000 mg/h | INTRAVENOUS | Status: DC
  Filled 2014-10-08: qty 200

## 2014-10-08 MED ORDER — LACTATED RINGERS IV SOLN
INTRAVENOUS | Status: DC | PRN
  Administered 2014-10-08: 11:00:00 via INTRAVENOUS

## 2014-10-08 MED ORDER — SUCCINYLCHOLINE CHLORIDE 20 MG/ML IJ SOLN
INTRAMUSCULAR | Status: DC | PRN
  Administered 2014-10-08: 60 mg via INTRAVENOUS

## 2014-10-08 MED ORDER — NOREPINEPHRINE BITARTRATE 1 MG/ML IV SOLN
0.0000 ug/min | INTRAVENOUS | Status: DC
  Administered 2014-10-08: 11 ug/min via INTRAVENOUS
  Administered 2014-10-09: 14 ug/min via INTRAVENOUS
  Administered 2014-10-09: 11 ug/min via INTRAVENOUS
  Administered 2014-10-09: 12 ug/min via INTRAVENOUS
  Administered 2014-10-09: 11 ug/min via INTRAVENOUS
  Administered 2014-10-12: 2 ug/min via INTRAVENOUS
  Filled 2014-10-08 (×9): qty 4

## 2014-10-08 MED ORDER — MILRINONE IN DEXTROSE 20 MG/100ML IV SOLN
0.3750 ug/kg/min | INTRAVENOUS | Status: DC
  Administered 2014-10-08 – 2014-10-13 (×12): 0.375 ug/kg/min via INTRAVENOUS
  Filled 2014-10-08 (×12): qty 100

## 2014-10-08 MED ORDER — FENTANYL CITRATE 0.05 MG/ML IJ SOLN: 50.0000 ug | INTRAMUSCULAR | Status: DC | PRN

## 2014-10-08 MED ORDER — AMIODARONE HCL IN DEXTROSE 360-4.14 MG/200ML-% IV SOLN
INTRAVENOUS | Status: DC | PRN
  Administered 2014-10-08: 60 mg/h via INTRAVENOUS

## 2014-10-08 MED ORDER — VECURONIUM BROMIDE 10 MG IV SOLR
INTRAVENOUS | Status: DC | PRN
  Administered 2014-10-08: 10 mg via INTRAVENOUS

## 2014-10-08 MED ORDER — METOPROLOL TARTRATE 1 MG/ML IV SOLN
5.0000 mg | INTRAVENOUS | Status: DC | PRN
  Filled 2014-10-08: qty 5

## 2014-10-08 MED ORDER — AMIODARONE LOAD VIA INFUSION
150.0000 mg | Freq: Once | INTRAVENOUS | Status: AC
  Administered 2014-10-08: 150 mg via INTRAVENOUS
  Filled 2014-10-08: qty 83.34

## 2014-10-08 MED ORDER — FENTANYL CITRATE 0.05 MG/ML IJ SOLN
INTRAMUSCULAR | Status: DC | PRN
  Administered 2014-10-08: 100 ug via INTRAVENOUS
  Administered 2014-10-08: 50 ug via INTRAVENOUS

## 2014-10-08 SURGICAL SUPPLY — 66 items
BLADE SURG ROTATE 9660 (MISCELLANEOUS) ×2 IMPLANT
CANISTER SUCTION 2500CC (MISCELLANEOUS) ×3 IMPLANT
CHLORAPREP W/TINT 26ML (MISCELLANEOUS) ×3 IMPLANT
COVER MAYO STAND STRL (DRAPES) ×6 IMPLANT
COVER SURGICAL LIGHT HANDLE (MISCELLANEOUS) ×3 IMPLANT
DRAPE LAPAROSCOPIC ABDOMINAL (DRAPES) ×3 IMPLANT
DRAPE PROXIMA HALF (DRAPES) ×6 IMPLANT
DRAPE UTILITY XL STRL (DRAPES) ×9 IMPLANT
DRAPE WARM FLUID 44X44 (DRAPE) ×3 IMPLANT
DRSG OPSITE POSTOP 4X10 (GAUZE/BANDAGES/DRESSINGS) IMPLANT
DRSG OPSITE POSTOP 4X8 (GAUZE/BANDAGES/DRESSINGS) IMPLANT
ELECT BLADE 6.5 EXT (BLADE) ×3 IMPLANT
ELECT CAUTERY BLADE 6.4 (BLADE) ×6 IMPLANT
ELECT REM PT RETURN 9FT ADLT (ELECTROSURGICAL) ×3
ELECTRODE REM PT RTRN 9FT ADLT (ELECTROSURGICAL) ×1 IMPLANT
GLOVE BIO SURGEON STRL SZ7 (GLOVE) ×6 IMPLANT
GLOVE BIO SURGEON STRL SZ8 (GLOVE) ×8 IMPLANT
GLOVE BIOGEL PI IND STRL 7.0 (GLOVE) IMPLANT
GLOVE BIOGEL PI IND STRL 7.5 (GLOVE) IMPLANT
GLOVE BIOGEL PI IND STRL 8 (GLOVE) ×2 IMPLANT
GLOVE BIOGEL PI INDICATOR 7.0 (GLOVE) ×6
GLOVE BIOGEL PI INDICATOR 7.5 (GLOVE) ×4
GLOVE BIOGEL PI INDICATOR 8 (GLOVE) ×8
GLOVE SURG SS PI 7.5 STRL IVOR (GLOVE) ×4 IMPLANT
GOWN STRL REUS W/ TWL LRG LVL3 (GOWN DISPOSABLE) ×4 IMPLANT
GOWN STRL REUS W/ TWL XL LVL3 (GOWN DISPOSABLE) ×2 IMPLANT
GOWN STRL REUS W/TWL LRG LVL3 (GOWN DISPOSABLE) ×12
GOWN STRL REUS W/TWL XL LVL3 (GOWN DISPOSABLE) ×6
KIT BASIN OR (CUSTOM PROCEDURE TRAY) ×3 IMPLANT
KIT OSTOMY DRAINABLE 2.75 STR (WOUND CARE) ×2 IMPLANT
KIT ROOM TURNOVER OR (KITS) ×3 IMPLANT
LEGGING LITHOTOMY PAIR STRL (DRAPES) ×1 IMPLANT
LIGASURE IMPACT 36 18CM CVD LR (INSTRUMENTS) ×3 IMPLANT
NS IRRIG 1000ML POUR BTL (IV SOLUTION) ×10 IMPLANT
OPSITE POST-OP VISIBLE ×2 IMPLANT
PACK GENERAL/GYN (CUSTOM PROCEDURE TRAY) ×3 IMPLANT
PAD ARMBOARD 7.5X6 YLW CONV (MISCELLANEOUS) ×6 IMPLANT
PENCIL BUTTON HOLSTER BLD 10FT (ELECTRODE) ×5 IMPLANT
RELOAD PROXIMATE 75MM BLUE (ENDOMECHANICALS) ×3 IMPLANT
RELOAD STAPLE 75 3.8 BLU REG (ENDOMECHANICALS) IMPLANT
SPECIMEN JAR LARGE (MISCELLANEOUS) ×2 IMPLANT
SPECIMEN JAR SMALL (MISCELLANEOUS) ×2 IMPLANT
SPECIMEN JAR X LARGE (MISCELLANEOUS) ×3 IMPLANT
SPONGE LAP 18X18 X RAY DECT (DISPOSABLE) ×2 IMPLANT
STAPLER PROXIMATE 75MM BLUE (STAPLE) ×2 IMPLANT
STAPLER VISISTAT 35W (STAPLE) ×3 IMPLANT
SUCTION POOLE TIP (SUCTIONS) ×3 IMPLANT
SURGILUBE 2OZ TUBE FLIPTOP (MISCELLANEOUS) IMPLANT
SUT PDS AB 1 TP1 96 (SUTURE) ×6 IMPLANT
SUT PROLENE 2 0 CT2 30 (SUTURE) IMPLANT
SUT PROLENE 2 0 KS (SUTURE) IMPLANT
SUT SILK 2 0 SH CR/8 (SUTURE) ×3 IMPLANT
SUT SILK 2 0 TIES 10X30 (SUTURE) ×3 IMPLANT
SUT SILK 3 0 SH CR/8 (SUTURE) ×3 IMPLANT
SUT SILK 3 0 TIES 10X30 (SUTURE) ×3 IMPLANT
SUT VIC AB 3-0 SH 18 (SUTURE) ×2 IMPLANT
SYR BULB IRRIGATION 50ML (SYRINGE) ×3 IMPLANT
TOWEL OR 17X26 10 PK STRL BLUE (TOWEL DISPOSABLE) ×4 IMPLANT
TRAY FOLEY CATH 14FRSI W/METER (CATHETERS) ×3 IMPLANT
TRAY FOLEY CATH 16FRSI W/METER (SET/KITS/TRAYS/PACK) IMPLANT
TRAY PROCTOSCOPIC FIBER OPTIC (SET/KITS/TRAYS/PACK) IMPLANT
TUBE CONNECTING 12'X1/4 (SUCTIONS) ×1
TUBE CONNECTING 12X1/4 (SUCTIONS) ×2 IMPLANT
UNDERPAD 30X30 INCONTINENT (UNDERPADS AND DIAPERS) IMPLANT
WATER STERILE IRR 1000ML POUR (IV SOLUTION) ×1 IMPLANT
YANKAUER SUCT BULB TIP NO VENT (SUCTIONS) ×3 IMPLANT

## 2014-10-08 NOTE — Progress Notes (Signed)
PT Cancellation Note  Patient Details Name: Greg [name] MRN: [id number] DOB: 1932/05/09   Cancelled Treatment:    Reason Eval/Treat Not Completed: Patient at procedure or test/unavailable; transporter in room to take patient to surgery this am.  Will cancel for today and attempt tomorrow.   WYNN,CYNDI 10/08/2014, 9:57 AM

## 2014-10-08 NOTE — Anesthesia Procedure Notes (Signed)
Procedures

## 2014-10-08 NOTE — Progress Notes (Signed)
Utilization Review Completed.  

## 2014-10-08 NOTE — Progress Notes (Signed)
[facility] Progress Note Patient Name: Greg [name] DOB: [birth year] MRN: [id number]   Date of Service  10/08/2014  HPI/Events of Note  Post-colectomy.  Patient hypotensive with CVP of 5  eICU Interventions  Bolus 1 liter NS     Intervention Category Major Interventions: Hypotension - evaluation and management  Mauri [place], P 10/08/2014, 10:55 PM

## 2014-10-08 NOTE — Anesthesia Preprocedure Evaluation (Signed)
Anesthesia Evaluation  Patient identified by MRN, date of birth, ID band Patient awake    Reviewed: Allergy & Precautions, NPO status , Patient's Chart, lab work & pertinent test results  History of Anesthesia Complications Negative for: history of anesthetic complications  Airway Mallampati: IV  TM Distance: <3 FB Neck ROM: Full    Dental  (+) Missing, Poor Dentition   Pulmonary shortness of breath, at rest and lying, sleep apnea , former smoker,  breath sounds clear to auscultation        Cardiovascular hypertension, Pt. on home beta blockers +CHF + dysrhythmias Atrial Fibrillation Rhythm:Irregular     Neuro/Psych negative neurological ROS  negative psych ROS   GI/Hepatic Neg liver ROS, Colon mass   Endo/Other  diabetes, Type 2Hypothyroidism   Renal/GU ARF and CRFRenal disease     Musculoskeletal   Abdominal   Peds  Hematology  (+) anemia ,   Anesthesia Other Findings   Reproductive/Obstetrics                             Anesthesia Physical Anesthesia Plan  ASA: IV  Anesthesia Plan: General   Post-op Pain Management:    Induction: Intravenous  Airway Management Planned: Oral ETT  Additional Equipment: CVP, Ultrasound Guidance Line Placement and Arterial line  Intra-op Plan:   Post-operative Plan: Extubation in OR and Possible Post-op intubation/ventilation  Informed Consent: I have reviewed the patients History and Physical, chart, labs and discussed the procedure including the risks, benefits and alternatives for the proposed anesthesia with the patient or authorized representative who has indicated his/her understanding and acceptance.   Dental advisory given  Plan Discussed with: CRNA and Surgeon  Anesthesia Plan Comments:         Anesthesia Quick Evaluation

## 2014-10-08 NOTE — Progress Notes (Signed)
The patient was taken to the OR at this a.m. before we had a chance to make rounds. It will be very important to make sure that he does not run a positive fluid balance. We will continue to follow through the weekend and help as needed with any potential cardiac complications.

## 2014-10-08 NOTE — Consult Note (Signed)
PULMONARY / CRITICAL CARE MEDICINE   Name: Greg [name] MRN: [id number] DOB: February 01, 1932    ADMISSION DATE:  09/22/2014 CONSULTATION DATE:  10/08/2014  REFERRING MD :  TRH  CHIEF COMPLAINT:  Post op respiratory failure  INITIAL PRESENTATION: 81 year old male who was admitted on 2/3 with fall that was then noted have a colonic mass.  Patient was taken to the OR for a total colectomy on 2/19.  Decision was made to keep him intubated post op and PCCM was consulted to take over his care.  STUDIES:  Colonoscopy with a colon mass L arm DVT 2/16 CT of the abdomen with wall thickening of the ascending colon.  SIGNIFICANT EVENTS: 2/19 total colectomy, left intubated.  HPI: Greg [name] is a 81 y.o. male, With history of type 2 diabetes mellitus, chronic kidney disease stage III Baseline creatinine around 2.3, essential hypertension, sleep apnea, left bundle branch block, systolic heart failure EF now 20-25%, who [facility] which he complains of weakness and fall,he was found to have acute on chronic respiratory failure with acute on chronic systolic and diastolic CHF, new onset atrial fibrillation and anemia. He was seen by cardiology, he was initially placed on dobutamine drip along with diuretics for atrial fibrillation and CHF, he was also seen by GI for anemia workup and colonoscopy relieved colonic mass. He was thought to be high risk for surgery at [facility] and transferred here for further care. He also developed acute renal failure and renal was following over therefore medical treatment as well. Developed L arm DVT there as well.  PAST MEDICAL HISTORY :   has a past medical history of Type 2 diabetes mellitus; Anemia of chronic disease; CKD (chronic kidney disease) stage 3, GFR 30-59 ml/min; Herpes zoster; Obesity; Exposure to hazardous substance; Intermittent Leukopenia; Essential hypertension; Sleep apnea; Cardiomyopathy; and Left bundle branch block.  has past surgical history that includes Nasal polyp surgery; Ventral hernia repair (N/A, 09/14/2013); Insertion of mesh (N/A, 09/14/2013); Colonoscopy (N/A, 10/04/2014); and Esophagogastroduodenoscopy (N/A, 10/04/2014). Prior to Admission medications   Medication Sig Start Date End Date Taking? Authorizing Provider  atorvastatin (LIPITOR) 20 MG tablet Take 20 mg by mouth daily.   Yes Historical Provider, MD  levothyroxine (SYNTHROID, LEVOTHROID) 125 MCG tablet Take 125 mcg by mouth daily before breakfast.   Yes Historical Provider, MD  Multiple Vitamins-Minerals (CENTRUM SILVER PO) Take 1 tablet by mouth daily.   Yes Historical Provider, MD  aspirin EC 81 MG tablet Take 81 mg by mouth every other day.     Historical Provider, MD  carvedilol (COREG) 3.125 MG tablet Take 1 tablet (3.125 mg total) by mouth 2 (two) times daily with a meal. 09/08/13   [name], MD   Allergies  Allergen Reactions  . Celery Oil     vomiting    FAMILY HISTORY:  has no family status information on file.  SOCIAL HISTORY:  reports that he has quit smoking. His smoking use included Cigars. His smokeless tobacco use includes Chew. He reports that he does not drink alcohol or use illicit drugs.  REVIEW OF SYSTEMS:  Unattainable, patient is sedated and intubated.  SUBJECTIVE:   VITAL SIGNS: Temp:  [97.4 F (36.3 C)-98.2 F (36.8 C)] 97.4 F (36.3 C) (02/19 1325) Pulse Rate:  [40-67] 61 (02/19 1530) Resp:  [0-28] 0 (02/19 1530) BP: (72-134)/(45-69) 72/45 mmHg (02/19 1500) SpO2:  [94 %-100 %] 100 % (02/19 1530) Arterial Line BP: (  89-153)/(44-65) 121/52 mmHg (02/19 1530) FiO2 (%):  [50 %] 50 % (02/19 1332) Weight:  [88.6 kg (195 lb 5.2 oz)] 88.6 kg (195 lb 5.2 oz) (02/19 0549) HEMODYNAMICS:   VENTILATOR SETTINGS: Vent Mode:  [-] PRVC FiO2 (%):  [50 %] 50 % Set Rate:  [10 bmp] 10 bmp Vt Set:  [600 mL] 600 mL PEEP:  [5 cmH20] 5 cmH20 Plateau Pressure:  [16 cmH20] 16 cmH20 INTAKE / OUTPUT:  Intake/Output  Summary (Last 24 hours) at 10/08/14 1543 Last data filed at 10/08/14 1500  Gross per 24 hour  Intake 2308.7 ml  Output   3050 ml  Net -741.3 ml    PHYSICAL EXAMINATION: General:  Chronically ill appearing elderly male, sedated on vent. Neuro:  Sedated, intubated, withdraws all ext to pain but not command. HEENT:  [place]/AT, PERRL, EOM-I and MMM. Cardiovascular:  RRR, Nl S1/S2, -M/R/G. Lungs:  Coarse BS diffusely. Abdomen:  Soft, NT, ND and +BS. Musculoskeletal:  -edema and -tenderness. Skin:  Intact, wound clean.  LABS:  CBC  Recent Labs Lab 10/07/14 0613 10/07/14 1711 10/08/14 0617 10/08/14 1337  WBC 5.1  --  5.3 6.3  HGB 7.6* 8.6* 8.8* 8.8*  HCT 25.3* 27.5* 28.7* 28.0*  PLT 270  --  310 283   Coag's No results for input(s): APTT, INR in the last 168 hours. BMET  Recent Labs Lab 10/07/14 0613 10/08/14 0617 10/08/14 1337  NA 136 135 137  K 4.5 4.2 4.0  CL 91* 93* 95*  CO2 38* 38* 30  BUN 77* 71* 68*  CREATININE 2.62* 2.60* 2.62*  GLUCOSE 133* 133* 187*   Electrolytes  Recent Labs Lab 10/07/14 0613 10/08/14 0617 10/08/14 1337  CALCIUM 8.5 8.8 9.2   Sepsis Markers No results for input(s): LATICACIDVEN, PROCALCITON, O2SATVEN in the last 168 hours. ABG  Recent Labs Lab 10/03/14 0650 10/08/14 1350  PHART 7.486* 7.467*  PCO2ART 52.9* 43.2  PO2ART 57.9* 98.0   Liver Enzymes  Recent Labs Lab 10/02/14 0550  AST 34  ALT 58*  ALKPHOS 48  BILITOT 0.6  ALBUMIN 2.8*   Cardiac Enzymes No results for input(s): TROPONINI, PROBNP in the last 168 hours. Glucose  Recent Labs Lab 10/07/14 1056 10/07/14 1555 10/07/14 2057 10/08/14 0609 10/08/14 0928 10/08/14 1340  GLUCAP 182* 192* 144* 130* 140* 183*   Imaging Dg Chest Port 1 View  10/07/2014   CLINICAL DATA:  Shortness of breath  EXAM: PORTABLE CHEST - 1 VIEW  COMPARISON:  09/22/2014  FINDINGS: Cardiac shadow remains enlarged. Bilateral pleural effusions are noted right greater than left. Given  some variation in the patient positioning the overall appearance is stable. No new focal infiltrate is seen.  IMPRESSION: Stable effusions.  No new focal abnormality is noted.   Electronically Signed   By: Inez Catalina M.D.   On: 10/07/2014 07:56   ASSESSMENT / PLAN:  PULMONARY OETT 2/19>>> A: VDRF post op, ABG reviewed requiring minimal support. P:   - F/U ABG. - F/U CXR. - Full vent support. - Anticipate will wean to extubate in AM.  CARDIOVASCULAR CVL R IJ DLC in OR>>> A: Post op hypotension.  Unlikely sepsis this fast. P:  - IVF as ordered. - Follow CVP. - Levophed for BP support. - Anticipate BP will improve once propofol is off.  RENAL A:  Stage 3 CKD with baseline Cr of 2.3.  Remains about the same now. P:   - Hydrate: NS at 75 ml/hr. - BMET in AM. - Replace  electrolytes as indicated. - Monitor Cr while on levophed.  GASTROINTESTINAL A:  ?Colon cancer.  S/P total colectomy with an ileostomy placement. P:   - F/U on path. - NPO for now. - ?if will need TPN, will defer GI tract use to surgery.  HEMATOLOGIC A:  DVT in the LUE. P:  - Will hold heparin for now until cleared by surgery. - Follow coags. - Will defer to surgery to clear.  INFECTIOUS A:  No evidence of active infection right now. P:   Abx: ceftin for post surgical prophylaxis.  ENDOCRINE A:  DM   P:   - CBG. - ISS.  NEUROLOGIC A:  No active concerns at this time. P:   RASS goal: 0 - Fentanyl pushes. - Propofol drip.  If not extubatable by AM will d/c propofol and start versed pushes due to hemodynamic effects of propofol (do not want to stop it today and start versed given delirium effects of benzos if we will be able to extubate in AM).  FAMILY  - Updates: no family present bedside.  - Inter-disciplinary family meet or Palliative Care meeting due by:    TODAY'S SUMMARY: 81 year old male, colonic mass, post op respiratory failure.  PCCM to take over.   The patient is critically ill  with multiple organ systems failure and requires high complexity decision making for assessment and support, frequent evaluation and titration of therapies, application of advanced monitoring technologies and extensive interpretation of multiple databases.   Critical Care Time devoted to patient care services described in this note is  35  Minutes. This time reflects time of care of this signee Dr Jennet Maduro. This critical care time does not reflect procedure time, or teaching time or supervisory time of PA/NP/Med student/Med Resident etc but could involve care discussion time.  Rush Farmer, M.D. [facility] Pulmonary/Critical Care Medicine. Pager: [phone]. After hours pager: [phone].  10/08/2014, 3:43 PM

## 2014-10-08 NOTE — Transfer of Care (Signed)
Immediate Anesthesia Transfer of Care Note  Patient: Greg [name]  Procedure(s) Performed: Procedure(s): EXPLORATOROY LAPAROTOMY (N/A) PARTIAL COLECTOMY WITH ILEOSTOMY (N/A)  Patient Location: 2316  Anesthesia Type:General  Level of Consciousness: sedated  Airway & Oxygen Therapy: Patient remains intubated per anesthesia plan and Patient placed on Ventilator (see vital sign flow sheet for setting)  Post-op Assessment: Report given to RN and Post -op Vital signs reviewed and stable  Post vital signs: Reviewed and stable  Last Vitals:  Filed Vitals:   10/08/14 1325  BP:   Pulse: 56  Temp: 36.3 C  Resp: 14    Complications: No apparent anesthesia complications

## 2014-10-08 NOTE — Progress Notes (Signed)
[place] for Heparin Indication: atrial fibrillation and DVT in RUE  Allergies  Allergen Reactions  . Celery Oil     vomiting   Patient Measurements: Height: 5\' 11"  (180.3 cm) Weight: 195 lb 5.2 oz (88.6 kg) IBW/kg (Calculated) : 75.3 Heparin Dosing Weight: 90 kg  Vital Signs: Temp: 97.5 F (36.4 C) (02/19 1615) Temp Source: Oral (02/19 1615) BP: 101/55 mmHg (02/19 1600) Pulse Rate: 66 (02/19 1645)  Labs:  Recent Labs  10/06/14 0519 10/07/14 3112  10/08/14 0617 10/08/14 1143 10/08/14 1337  HGB 7.9* 7.6*  < > 8.8* 9.2* 8.8*  HCT 26.0* 25.3*  < > 28.7* 27.0* 28.0*  PLT 273 270  --  310  --  283  HEPARINUNFRC 0.48 0.57  --  0.13*  --   --   CREATININE  --  2.62*  --  2.60*  --  2.62*  < > = values in this interval not displayed. Estimated Creatinine Clearance: 24 mL/min (by C-G formula based on Cr of 2.62).  Assessment: [name] admitted with new onset Afib & transfusion dependent anemia, FOBT x3 as outpatient.  Anticoagulation was initially held pending GI work-up.  Venous doppler ultrasound on 09/23/14 was positive for right upper extremity axillary and brachial acute occlusive DVT and pharmacy was asked to initiate heparin.   He is now s/p OR (partial colectomy w/ ileostomy and live biopsy). I spoke with Dr. Grandville Silos and plans are to resume heparin on 2/20 at 2am.  Heparin was last infusing at 1600 units/hr and the heparin level this am was 0.13 at about 6am but heparin was stopped at 4am today.  Prior to this heparin levels were at goal on 1500-1600 units/hr   Goal of Therapy:  Heparin level 0.3-0.7 units/ml Monitor platelets by anticoagulation protocol: Yes   Plan:  -Restart heparin at 1600 units/hr at 2am on 2/20 -Heparin level in 8 hours and daily wth CBC daily  Hildred Laser, Pharm D 10/08/2014 4:56 PM

## 2014-10-08 NOTE — Progress Notes (Signed)
Clinical handoff report provided by [name], [facility]. Patient has been assessed and work up for SNF placement was initiated.  Patient is anticipated to undergo colon surgery today with anticipated complex medical needs in place.  Per [facility]- patient may benefit from placement in LTAC prior to SNF placement.  CSW will sign off for now but will be available should SNF option be indicated.  Will discuss with RNCM- Ellan Lambert re: sign. Lorie Phenix. Pauline Good, [place]

## 2014-10-08 NOTE — Op Note (Signed)
09/22/2014 - 10/08/2014  1:13 PM  PATIENT:  Greg [name]  81 y.o. male  PRE-OPERATIVE DIAGNOSIS:  right colon mass  POST-OPERATIVE DIAGNOSIS:  right colon mass, mass left liver  PROCEDURE:  Procedure(s): EXPLORATOROY LAPAROTOMY PARTIAL COLECTOMY WITH ILEOSTOMY LIVER BIOPSY  SURGEON:  Surgeon(s): Georganna Skeans, MD  ASSISTANTS: none   ANESTHESIA:   general  EBL:  Total I/O In: 500 [I.V.:500] Out: 100 [Blood:100]  BLOOD ADMINISTERED:none  DRAINS: none   SPECIMEN:  Excision  DISPOSITION OF SPECIMEN:  PATHOLOGY  COUNTS:  YES  DICTATION: .Dragon Dictation  Findings: Large mass in the cecum, palpable lymph nodes in the right colon mesentery, multiple palpable liver masses suspicious for metastases  Greg [name] is brought for right colectomy for an obstructing mass.  Informed was obtained.  He was identified in the holding area.  He was brought to the operating room and general endotracheal anesthesia was administered by the anesthesia staff.  He received intravenous antibiotics.  Central line and an arterial line was placed by anesthesia.  Foley was placed by nursing.  Abdomen was prepped and draped in a sterile fashion.  Timeout procedure was performed.  Midline incision was made.  Subcutaneous sutures were dissected revealing the anterior fascia.  This was divided sharply along the midline.  Peritoneal cavity was entered under direct vision.  Fascia was opened to the length of the incision.  Abdominal x-rays revealed large mass in the cecum with tattoo dye.  There were palpable lymph nodes in the right colon mesentery.  There are multiple liver masses with visible liver mass on the underside of the left lobe medially.  The right colon was mobilized from its lateral peritoneal attachments using cautery and LigaSure along the line of Toldt.  Mobilization continued through the hepatic flexure.  Gallbladder was intact but distended.  Mobilization continued up including the mid transverse colon.   Once this was mobilized, the terminal ileum was divided with GIA-75 stapler.  The mesentery of the right colon was then taken down with LigaSure.  Additionally, we clamped and used suture ligatures on the ileocolic and right colic bundles.  Mesentery was taken down up to the mid transverse colon.  That was then divided with GIA-75.  The suspicious mass in the underside of the medial left lobe of the liver was biopsied sharply.  Specimen was sent separately to pathology.  Cautery was used to get excellent hemostasis.  Due to the patient's cardiac issues and ongoing problems with rapid atrial fibrillation in the operating room, decision was made not to do an anastomosis.  Incision was made in the right lower quadrant and the skin was cored down including subcutaneous fat to create a ileostomy.  Cruciate incision was made in the fascia.  Fascia was carefully opened and the aperture was enlarged to allow passage of the distal ileum.  Abdomen was then copiously irrigated.  I changed my gloves.  Hemostasis was ensured.  Orogastric tube was confirmed in position.  Bowel was returned to anatomic position after running the small bowel back to the ligament of Treitz.  No other abnormalities were found with the exception of multiple very large sigmoid colon diverticuli.  Bowel was returned to anatomic position.  Midline fascia was closed with running #1 loop PDS from each end tied in the middle.  Skin was closed with staples.  Ileostomy was matured with interrupted 3-0 Vicryl in a Brooke fashion.  All counts were correct.  Patient tolerated the procedure fairly well though had some issues  from a cardiac standpoint.  He will be taken directly to the intensive care unit on the ventilator.  I discussed his case with critical care medicine who will also consult.  He is in critical but stable condition.  PATIENT DISPOSITION:  ICU - intubated and critically ill.   Delay start of Pharmacological VTE agent (>24hrs) due to  surgical blood loss or risk of bleeding:  no  Georganna Skeans, MD, MPH, FACS Pager: [phone]  2/19/20161:13 PM

## 2014-10-08 NOTE — Progress Notes (Signed)
[place] for Heparin Indication: atrial fibrillation and DVT in RUE  Allergies  Allergen Reactions  . Celery Oil     vomiting   Patient Measurements: Height: 5\' 11"  (180.3 cm) Weight: 195 lb 5.2 oz (88.6 kg) IBW/kg (Calculated) : 75.3 Heparin Dosing Weight: 90 kg  Vital Signs: Temp: 98.2 F (36.8 C) (02/19 0549) Temp Source: Oral (02/19 0549) BP: 106/62 mmHg (02/19 0756) Pulse Rate: 65 (02/19 0756)  Labs:  Recent Labs  10/06/14 0519 10/07/14 0613 10/07/14 1711 10/08/14 0617  HGB 7.9* 7.6* 8.6* 8.8*  HCT 26.0* 25.3* 27.5* 28.7*  PLT 273 270  --  310  HEPARINUNFRC 0.48 0.57  --  0.13*  CREATININE  --  2.62*  --   --    Estimated Creatinine Clearance: 24 mL/min (by C-G formula based on Cr of 2.62).  Assessment: [name] admitted with new onset Afib & transfusion dependent anemia, FOBT x3 as outpatient.  Anticoagulation was initially held pending GI work-up.  Venous doppler ultrasound on 09/23/14 was positive for right upper extremity axillary and brachial acute occlusive DVT and pharmacy was asked to initiate heparin.   Coag: New RUE DVT & Afib, Heparin stopped at 4am 2/19 for surgery. Heparin level subtherapeutic this am, follow up post op for anticoagulation plan. 1 unit PRBCx 2/18. CBC remains low & stable. No bleeding noted.  Goal of Therapy:  Heparin level 0.3-0.7 units/ml Monitor platelets by anticoagulation protocol: Yes   Plan:  Will follow up post-op for anticoagulation plan.  Thank you for allowing pharmacy to be a part of this patients care team.  Rowe Robert Pharm.D., BCPS, AQ-Cardiology Clinical Pharmacist 10/08/2014 8:00 AM Pager: [phone] Phone: [phone]

## 2014-10-08 NOTE — Progress Notes (Signed)
Confirmed with MD to go ahead and give coreg and synthroid this am before surgery.  Will continue to monitor. Graceann Congress

## 2014-10-08 NOTE — Progress Notes (Addendum)
Patient Demographics  Greg [name], is a 81 y.o. male, DOB - 02/21/1932, [id number]  Admit date - 09/22/2014   Admitting Physician Verlee Monte, MD  Outpatient Primary MD for the patient is [name], MD  LOS - 16   Chief Complaint  Patient presents with  . Fall      Brief summary  Greg [name] is a 81 y.o. male, With history of type 2 diabetes mellitus, chronic kidney disease stage III Baseline creatinine around 2.3, essential hypertension, sleep apnea, left bundle branch block, systolic heart failure EF now 20-25%, who [facility] which he complains of weakness and fall,he was found to have acute on chronic respiratory failure with acute on chronic systolic and diastolic CHF, new onset atrial fibrillation and anemia. He was seen by cardiology, he was initially placed on dobutamine drip along with diuretics for atrial fibrillation and CHF, he was also seen by GI for anemia workup and colonoscopy relieved colonic mass. He was thought to be high risk for surgery at [facility] and transferred here for further care. He also developed acute renal failure and renal was following over therefore medical treatment as well. Developed L arm DVT there as well.  Currently besides generalized weakness patient is symptom-free, he does condone to weight loss at home which was unintentional, he says he has not been out of the bed since he has been in the hospital for 2 weeks, denies any chest pain, no shortness of breath at this time, no blood in stool or urine, no personal or family history of colon cancers, he never had any colonoscopy previously.   Subjective:   Greg [name] today has, No headache, No chest pain, No abdominal pain - No Nausea, No new weakness tingling or  numbness, No Cough - SOB.    Assessment & Plan    1. 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 CCS called will require surgical resection on 10-07-14. Once surgery is completed we will involve oncology as well. He is a moderate to high risk for adverse cardiopulmonary outcome during perioperative period, this is agreeable to the patient and his wife and they would like to proceed for surgery. Cardiology following.  He received 1 unit of packed RBC on 10/07/2014 in preparation for surgery. We have 2 units on hold post transmission H&H is stable.    2. Acute on chronic systolic heart failure EF now 25%. Continue present dose beta blocker and diuretic, he was initially on to between drip, cardiology has been consulted for further management.no ACE/ARB due to renal failure.    3. Atrial flutter new onset. Continue beta blocker for rate control, telemetry monitor, no anticoagulation due to #1 above.    4. DM type II. check A1c, sliding scale here.    5.ARF on chronic injury disease stage III. Baseline creatinine around 2.3. Continue diuresis and monitor. Avoid nephrotoxins, no ACE/ARB.    6. Severe protein calorie malnutrition. Nutritionist input after surgery.    7. Liver hyperdensities. Could be colonic metastasis. Oncology input after surgery    8.L arm DVT - Hep gtt.     Code Status:  Full  Family Communication: None   Disposition Plan: Stable  Procedures   TEE  - Procedure narrative: Transthoracic echocardiography. Imagequality was adequate. The study was technically difficult, as a result of poor patient compliance. - Left ventricle: The cavity size was normal. Systolic function wasseverely reduced. The estimated ejection fraction was in the range of 20% to 25%. The study was not technically sufficient toallow evaluation of LV diastolic dysfunction due to atrialfibrillation. Doppler parameters are consistent with high ventricular  filling pressure. Mild posterior wall with severe asymmetric septal hypertrophy (2.16 cm). - Ventricular septum: Septal motion showed abnormal function and dyssynergy. These changes are consistent with a left bundle branch block. - Aortic valve: Mildly calcified annulus. Trileaflet; mildlythickened leaflets. There was trivial regurgitation. - Mitral valve: Mildly thickened annulus. Mildly thickened leaflets. There was mild regurgitation. - Left atrium: The atrium was severely dilated. Volume/bsa, ES, (1-plane Simpson&'s, A2C): 42.6 ml/m^2. - Right ventricle: The cavity size was mildly dilated. Systolicfunction was mildly reduced. - Right atrium: The atrium was mildly to moderately dilated. - Tricuspid valve: There was mild regurgitation. - Pulmonary arteries: PA peak pressure: 50 mm Hg (S). Moderatelyelevated pulmonary pressures. - Systemic veins: IVC is dilated with normal respiratory variation. Estimated CVP 8 mmHg.  Impressions:  - When compared to report dated 4/74/25, LV systolic function has significantly declined.  Consults  Cards, CCS   Medications  Scheduled Meds: . [MAR Hold] antiseptic oral rinse  7 mL Mouth Rinse BID  . [MAR Hold] atorvastatin  20 mg Oral q1800  . [MAR Hold] carvedilol  3.125 mg Oral BID WC  . cefoTEtan (CEFOTAN) IV  2 g Intravenous On Call to OR  . [MAR Hold] feeding supplement (RESOURCE BREEZE)  1 Container Oral BID BM  . [MAR Hold] insulin aspart  0-5 Units Subcutaneous QHS  . [MAR Hold] insulin aspart  0-9 Units Subcutaneous TID WC  . [MAR Hold] levothyroxine  125 mcg Oral QAC breakfast  . [MAR Hold] mupirocin cream   Topical BID  . mupirocin ointment      . [MAR Hold] pantoprazole  40 mg Oral BID AC  . [MAR Hold] polyethylene glycol  17 g Oral Daily  . [MAR Hold] sodium chloride  2 spray Each Nare QID  . [MAR Hold] torsemide  30 mg Oral Daily   Continuous Infusions:   PRN Meds:.[MAR Hold] sodium chloride, [MAR Hold] acetaminophen, [MAR  Hold] alum & mag hydroxide-simeth, [MAR Hold] diphenhydrAMINE, [MAR Hold] guaiFENesin-dextromethorphan, [MAR Hold] HYDROcodone-acetaminophen, [MAR Hold]  morphine injection, [MAR Hold] ondansetron (ZOFRAN) IV, [MAR Hold] sodium chloride  DVT Prophylaxis    SCDs    Lab Results  Component Value Date   PLT 310 10/08/2014    Antibiotics     Anti-infectives    Start     Dose/Rate Route Frequency Ordered Stop   10/08/14 0600  cefoTEtan (CEFOTAN) 2 g in dextrose 5 % 50 mL IVPB     2 g 100 mL/hr over 30 Minutes Intravenous On call to O.R. 10/07/14 0806 10/09/14 0559          Objective:   Filed Vitals:   10/07/14 2008 10/08/14 0234 10/08/14 0549 10/08/14 0756  BP: 117/69 134/51 130/57 106/62  Pulse: 64 53 62 65  Temp: 98 F (36.7 C) 98 F (36.7 C) 98.2 F (36.8 C)   TempSrc: Oral Oral Oral Oral  Resp: 18 18 16 15   Height:      Weight:   88.6 kg (195 lb 5.2 oz)   SpO2: 98% 98% 94% 98%    Wt Readings from  Last 3 Encounters:  10/08/14 88.6 kg (195 lb 5.2 oz)  10/02/13 112.22 kg (247 lb 6.4 oz)  09/15/13 124.1 kg (273 lb 9.5 oz)     Intake/Output Summary (Last 24 hours) at 10/08/14 0932 Last data filed at 10/08/14 0636  Gross per 24 hour  Intake 1021.6 ml  Output   4050 ml  Net -3028.4 ml     Physical Exam  Awake Alert, Oriented X 3, No new F.N deficits, Normal affect .AT,PERRAL Supple Neck,No JVD, No cervical lymphadenopathy appriciated.  Symmetrical Chest wall movement, Good air movement bilaterally, CTAB iRRR,No Gallops,Rubs or new Murmurs, No Parasternal Heave +ve B.Sounds, Abd Soft, No tenderness, No organomegaly appriciated, No rebound - guarding or rigidity. No Cyanosis, Clubbing or edema, No new Rash or bruise     Data Review   Micro Results Recent Results (from the past 240 hour(s))  Clostridium Difficile by PCR     Status: None   Collection Time: 09/30/14  1:55 PM  Result Value Ref Range Status   C difficile by pcr NEGATIVE NEGATIVE Final     Radiology Reports Ct Abdomen Pelvis Wo Contrast  10/05/2014   CLINICAL DATA:  Known colonic mass on recent colonoscopy  EXAM: CT ABDOMEN AND PELVIS WITHOUT CONTRAST  TECHNIQUE: Multidetector CT imaging of the abdomen and pelvis was performed following the standard protocol without IV contrast.  COMPARISON:  09/02/2013  FINDINGS: Bilateral lower lobe consolidation with associated moderate effusions are seen. No parenchymal nodules are noted.  The gallbladder, spleen, adrenal glands and pancreas are normal in their CT appearance. The liver is predominately homogeneous in attenuation. A few vague areas of decreased attenuation are noted within the medial and lateral segment of the left lobe of the liver best seen on image number 21 of series 2. Given the clinical history the possibility of underlying metastatic disease could not be totally excluded. The kidneys are well visualized bilaterally and reveal no renal calculi or obstructive changes.  The previously described right lateral abdominal wall hernia has been rib reduced and repair. No residual hernia is seen. Some wall thickening is noted in the distal terminal ileum and also within the proximal ascending colon. These changes in the ascending colon may be related to the known underlying colonic mass. Scattered diverticular change is noted without diverticulitis. No significant pelvic mass lesion is seen. The bladder is well distended. No sizable lymphadenopathy is seen. Degenerative changes of the lumbar spine are seen. No bony lesion is noted.  IMPRESSION: Bilateral moderate pleural effusions with associated lower lobe atelectasis/ consolidation.  Wall thickening in the ascending colon likely related to the patient's given clinical history of colonic mass.  Hypodensities within the liver of uncertain etiology. The possibility of metastatic disease cannot be totally excluded. Ultrasound of the liver may be helpful in this regard.  Diverticulosis without  diverticulitis.  No other focal abnormality is seen.   Electronically Signed   By: Inez Catalina M.D.   On: 10/05/2014 17:03   Dg Chest 2 View  09/22/2014   CLINICAL DATA:  Left anterior shoulder abrasion, Fall.  EXAM: CHEST  2 VIEW  COMPARISON:  09/02/2013  FINDINGS: There is a small to moderate right pleural effusion. Right lower lobe atelectasis or infiltrate. No confluent opacity on the left. Cardiomegaly with vascular congestion. No acute bony abnormality.  IMPRESSION: Small to moderate right pleural effusion with right lower lobe atelectasis or consolidation.  Cardiomegaly, vascular congestion.   Electronically Signed   By: Rolm Baptise M.D.  On: 09/22/2014 08:20   Ct Head Wo Contrast  09/22/2014   CLINICAL DATA:  81 year old male with dizziness and fall this morning when going to the bathroom. Left side head and year injury. Labored breathing. Initial encounter.  EXAM: CT HEAD WITHOUT CONTRAST  TECHNIQUE: Contiguous axial images were obtained from the base of the skull through the vertex without intravenous contrast.  COMPARISON:  None.  FINDINGS: Chronic paranasal sinus surgery and changes of chronic sinusitis. Mastoids are clear.  No visible orbit or scalp soft tissue injury identified. No acute osseous abnormality identified.  Scattered areas of chronic appearing encephalomalacia in the left MCA territory. Chronic lacunar infarct in the left thalamus. Mild ex vacuo enlargement of the left lateral ventricle. No midline shift, mass effect, or evidence of intracranial mass lesion. No acute intracranial hemorrhage identified. No evidence of cortically based acute infarction identified. No suspicious intracranial vascular hyperdensity. Possible small chronic lacunar infarcts in the cerebellar tonsils.  IMPRESSION: 1. No acute intracranial abnormality. No acute traumatic injury identified. 2. Chronic small and medium-sized vessel ischemia, mostly in the left hemisphere.   Electronically Signed   By: Lars Pinks M.D.   On: 09/22/2014 08:11   US Venous Img Upper Uni Right  09/23/2014   CLINICAL DATA:  Right upper extremity edema, pain  EXAM: RIGHT UPPER EXTREMITY VENOUS DOPPLER ULTRASOUND  TECHNIQUE: Gray-scale sonography with graded compression, as well as color Doppler and duplex ultrasound were performed to evaluate the upper extremity deep venous system from the level of the subclavian vein and including the jugular, axillary, basilic, radial, ulnar and upper cephalic vein. Spectral Doppler was utilized to evaluate flow at rest and with distal augmentation maneuvers.  COMPARISON:  None.  FINDINGS: Contralateral Subclavian Vein: Respiratory phasicity is normal and symmetric with the symptomatic side. No evidence of thrombus. Normal compressibility.  Internal Jugular Vein: No evidence of thrombus. Normal compressibility, respiratory phasicity and response to augmentation.  Subclavian Vein: No evidence of thrombus. Normal compressibility, respiratory phasicity and response to augmentation.  Axillary Vein: Intraluminal hypoechoic thrombus present in the right axillary vein appearing occlusive. Axillary vein is noncompressible.  Cephalic Vein: No evidence of thrombus. Normal compressibility, respiratory phasicity and response to augmentation.  Basilic Vein: No evidence of thrombus. Normal compressibility, respiratory phasicity and response to augmentation.  Brachial Veins: Axillary thrombus extends into the brachial vein which is also occlusive and noncompressible. Brachial vein is duplicated.  Radial Veins: No evidence of thrombus. Normal compressibility, respiratory phasicity and response to augmentation.  Ulnar Veins: No evidence of thrombus. Normal compressibility, respiratory phasicity and response to augmentation.  Venous Reflux:  None visualized.  Other Findings:  Subcutaneous for arm edema evident.  IMPRESSION: Positive exam for right upper extremity axillary and brachial acute occlusive DVT.  These results  will be called to the ordering clinician or representative by the Radiology Department at the imaging location.   Electronically Signed   By: Daryll Brod M.D.   On: 09/23/2014 16:02   Dg Chest Port 1 View  10/07/2014   CLINICAL DATA:  Shortness of breath  EXAM: PORTABLE CHEST - 1 VIEW  COMPARISON:  09/22/2014  FINDINGS: Cardiac shadow remains enlarged. Bilateral pleural effusions are noted right greater than left. Given some variation in the patient positioning the overall appearance is stable. No new focal infiltrate is seen.  IMPRESSION: Stable effusions.  No new focal abnormality is noted.   Electronically Signed   By: Inez Catalina M.D.   On: 10/07/2014 07:56  Dg Shoulder Left  09/22/2014   CLINICAL DATA:  Fall last night with abrasion and injury to left shoulder. Initial encounter.  EXAM: LEFT SHOULDER - 2+ VIEW  COMPARISON:  None.  FINDINGS: No acute fracture or dislocation identified. There are mild degenerative changes involving the [facility] joint and glenohumeral joint. No bony lesions or destruction.  IMPRESSION: No acute fracture identified.   Electronically Signed   By: Aletta Edouard M.D.   On: 09/22/2014 08:18     CBC  Recent Labs Lab 10/04/14 0519 10/05/14 0514 10/06/14 0519 10/07/14 0613 10/07/14 1711 10/08/14 0617  WBC 6.7 7.5 7.4 5.1  --  5.3  HGB 7.6* 8.2* 7.9* 7.6* 8.6* 8.8*  HCT 25.5* 27.9* 26.0* 25.3* 27.5* 28.7*  PLT 245 271 273 270  --  310  MCV 93.8 94.6 93.9 92.7  --  90.5  MCH 27.9 27.8 28.5 27.8  --  27.8  MCHC 29.8* 29.4* 30.4 30.0  --  30.7  RDW 18.9* 18.8* 19.3* 19.3*  --  18.9*    Chemistries   Recent Labs Lab 10/02/14 0550 10/03/14 0510 10/04/14 0519 10/05/14 0514 10/07/14 0613 10/08/14 0617  NA 137 137 138 137 136 135  K 4.2 4.3 4.4 4.1 4.5 4.2  CL 88* 89* 88* 90* 91* 93*  CO2 37* 40* 39* 37* 38* 38*  GLUCOSE 129* 130* 124* 134* 133* 133*  BUN 94* 89* 90* 83* 77* 71*  CREATININE 2.45* 2.44* 2.38* 2.38* 2.62* 2.60*  CALCIUM 8.8 8.7 8.8 8.9  8.5 8.8  AST 34  --   --   --   --   --   ALT 58*  --   --   --   --   --   ALKPHOS 48  --   --   --   --   --   BILITOT 0.6  --   --   --   --   --    ------------------------------------------------------------------------------------------------------------------ estimated creatinine clearance is 24.1 mL/min (by C-G formula based on Cr of 2.6). ------------------------------------------------------------------------------------------------------------------  Recent Labs  10/06/14 1841  HGBA1C 6.5*   ------------------------------------------------------------------------------------------------------------------ No results for input(s): CHOL, HDL, LDLCALC, TRIG, CHOLHDL, LDLDIRECT in the last 72 hours. ------------------------------------------------------------------------------------------------------------------ No results for input(s): TSH, T4TOTAL, T3FREE, THYROIDAB in the last 72 hours.  Invalid input(s): FREET3 ------------------------------------------------------------------------------------------------------------------ No results for input(s): VITAMINB12, FOLATE, FERRITIN, TIBC, IRON, RETICCTPCT in the last 72 hours.  Coagulation profile No results for input(s): INR, PROTIME in the last 168 hours.  No results for input(s): DDIMER in the last 72 hours.  Cardiac Enzymes No results for input(s): CKMB, TROPONINI, MYOGLOBIN in the last 168 hours.  Invalid input(s): CK ------------------------------------------------------------------------------------------------------------------ Invalid input(s): POCBNP     Time Spent in minutes   35   SINGH,PRASHANT K M.D on 10/08/2014 at 9:32 AM  Between 7am to 7pm - Pager - [phone]  After 7pm go to [email or web address] - [facility] Office  [phone]

## 2014-10-08 NOTE — Progress Notes (Signed)
4 Days Post-Op  Subjective: No new issues  Objective: Vital signs in last 24 hours: Temp:  [97.5 F (36.4 C)-98.4 F (36.9 C)] 97.6 F (36.4 C) (02/19 0756) Pulse Rate:  [51-67] 65 (02/19 0756) Resp:  [15-18] 15 (02/19 0756) BP: (106-134)/(46-69) 106/62 mmHg (02/19 0756) SpO2:  [94 %-98 %] 98 % (02/19 0756) Weight:  [195 lb 5.2 oz (88.6 kg)] 195 lb 5.2 oz (88.6 kg) (02/19 0549) Last BM Date: 10/04/14  Intake/Output from previous day: 02/18 0701 - 02/19 0700 In: 1741.6 [P.O.:1200; I.V.:206.6; Blood:335] Out: 2774 [Urine:4050] Intake/Output this shift:    General appearance: alert and cooperative Resp: clear to auscultation bilaterally Cardio: irregularly irregular rhythm GI: soft, site marked  Lab Results:   Recent Labs  10/07/14 0613 10/07/14 1711 10/08/14 0617  WBC 5.1  --  5.3  HGB 7.6* 8.6* 8.8*  HCT 25.3* 27.5* 28.7*  PLT 270  --  310   BMET  Recent Labs  10/07/14 0613 10/08/14 0617  NA 136 135  K 4.5 4.2  CL 91* 93*  CO2 38* 38*  GLUCOSE 133* 133*  BUN 77* 71*  CREATININE 2.62* 2.60*  CALCIUM 8.5 8.8   PT/INR No results for input(s): LABPROT, INR in the last 72 hours. ABG No results for input(s): PHART, HCO3 in the last 72 hours.  Invalid input(s): PCO2, PO2  Studies/Results: Dg Chest Port 1 View  10/07/2014   CLINICAL DATA:  Shortness of breath  EXAM: PORTABLE CHEST - 1 VIEW  COMPARISON:  09/22/2014  FINDINGS: Cardiac shadow remains enlarged. Bilateral pleural effusions are noted right greater than left. Given some variation in the patient positioning the overall appearance is stable. No new focal infiltrate is seen.  IMPRESSION: Stable effusions.  No new focal abnormality is noted.   Electronically Signed   By: Inez Catalina M.D.   On: 10/07/2014 07:56    Anti-infectives: Anti-infectives    Start     Dose/Rate Route Frequency Ordered Stop   10/08/14 0600  cefoTEtan (CEFOTAN) 2 g in dextrose 5 % 50 mL IVPB     2 g 100 mL/hr over 30 Minutes  Intravenous On call to O.R. 10/07/14 1287 10/09/14 0559      Assessment/Plan: 1. Hepatic flexure colonic adenocarcinoma, questionable liver mets - to OR for ext R colectomy, possible ileostomy. Procedure/risks/benefits again discussed with him and his wife. He agrees. He understands the high mortality risk and significant likelihood of having to stay on the vent post-op. 2. Anemia - up S/P TF 3. CKD 4. CHF 5. DM 6. HTN 7. Cardiomyopathy EF 20-25% - appreciate cardiologu F/U, Coreg this AM  LOS: 16 days    Greg [name] E 10/08/2014

## 2014-10-08 NOTE — Progress Notes (Signed)
RT called to bedside to assess ALINE. Unable to draw back or get accurate BP. ALINE repositioned and redressed. ALINE pulled by RT. RT attempted to reinsert ALINE x2 into L radial artery without success. Right radial artery is restricted. RN is aware and will make MD aware.

## 2014-10-08 NOTE — Anesthesia Postprocedure Evaluation (Signed)
  Anesthesia Post-op Note  Patient: Greg [name]  Procedure(s) Performed: Procedure(s): EXPLORATOROY LAPAROTOMY (N/A) PARTIAL COLECTOMY WITH ILEOSTOMY (N/A)  Patient Location: ICU  Anesthesia Type:General  Level of Consciousness: sedated  Airway and Oxygen Therapy: Patient remains intubated per anesthesia plan  Post-op Pain: none  Post-op Assessment: Post-op Vital signs reviewed, Patient's Cardiovascular Status Stable, Respiratory Function Stable, Patent Airway and No signs of Nausea or vomiting  Post-op Vital Signs: Reviewed and stable  Last Vitals:  Filed Vitals:   10/08/14 1600  BP: 101/55  Pulse: 62  Temp:   Resp: 10    Complications: No apparent anesthesia complications

## 2014-10-09 ENCOUNTER — Inpatient Hospital Stay (HOSPITAL_COMMUNITY): Payer: Medicare Other

## 2014-10-09 ENCOUNTER — Encounter (HOSPITAL_COMMUNITY): Payer: Self-pay | Admitting: General Surgery

## 2014-10-09 DIAGNOSIS — Z978 Presence of other specified devices: Secondary | ICD-10-CM | POA: Insufficient documentation

## 2014-10-09 DIAGNOSIS — Z789 Other specified health status: Secondary | ICD-10-CM

## 2014-10-09 LAB — BASIC METABOLIC PANEL
Anion gap: 6 (ref 5–15)
BUN: 62 mg/dL — AB (ref 6–23)
CALCIUM: 8.5 mg/dL (ref 8.4–10.5)
CO2: 32 mmol/L (ref 19–32)
Chloride: 95 mmol/L — ABNORMAL LOW (ref 96–112)
Creatinine, Ser: 2.49 mg/dL — ABNORMAL HIGH (ref 0.50–1.35)
GFR calc Af Amer: 27 mL/min — ABNORMAL LOW (ref >90)
GFR calc non Af Amer: 23 mL/min — ABNORMAL LOW (ref >90)
GLUCOSE: 213 mg/dL — AB (ref 70–99)
Potassium: 3.9 mmol/L (ref 3.5–5.1)
SODIUM: 133 mmol/L — AB (ref 135–145)

## 2014-10-09 LAB — HEPARIN LEVEL (UNFRACTIONATED): Heparin Unfractionated: 0.51 IU/mL (ref 0.30–0.70)

## 2014-10-09 LAB — GLUCOSE, CAPILLARY
GLUCOSE-CAPILLARY: 168 mg/dL — AB (ref 70–99)
GLUCOSE-CAPILLARY: 179 mg/dL — AB (ref 70–99)
GLUCOSE-CAPILLARY: 203 mg/dL — AB (ref 70–99)
GLUCOSE-CAPILLARY: 76 mg/dL (ref 70–99)
GLUCOSE-CAPILLARY: 99 mg/dL (ref 70–99)
Glucose-Capillary: 71 mg/dL (ref 70–99)

## 2014-10-09 LAB — POCT I-STAT 3, ART BLOOD GAS (G3+)
ACID-BASE EXCESS: 6 mmol/L — AB (ref 0.0–2.0)
Bicarbonate: 30.1 mEq/L — ABNORMAL HIGH (ref 20.0–24.0)
O2 Saturation: 100 %
PCO2 ART: 42.9 mmHg (ref 35.0–45.0)
PO2 ART: 160 mmHg — AB (ref 80.0–100.0)
Patient temperature: 98.6
TCO2: 31 mmol/L (ref 0–100)
pH, Arterial: 7.455 — ABNORMAL HIGH (ref 7.350–7.450)

## 2014-10-09 LAB — CBC
HEMATOCRIT: 27.2 % — AB (ref 39.0–52.0)
Hemoglobin: 8.6 g/dL — ABNORMAL LOW (ref 13.0–17.0)
MCH: 28.6 pg (ref 26.0–34.0)
MCHC: 31.6 g/dL (ref 30.0–36.0)
MCV: 90.4 fL (ref 78.0–100.0)
Platelets: 315 10*3/uL (ref 150–400)
RBC: 3.01 MIL/uL — AB (ref 4.22–5.81)
RDW: 18.8 % — ABNORMAL HIGH (ref 11.5–15.5)
WBC: 8.2 10*3/uL (ref 4.0–10.5)

## 2014-10-09 LAB — PHOSPHORUS: Phosphorus: 4.5 mg/dL (ref 2.3–4.6)

## 2014-10-09 LAB — MAGNESIUM: Magnesium: 1.8 mg/dL (ref 1.5–2.5)

## 2014-10-09 LAB — PROCALCITONIN: Procalcitonin: 0.44 ng/mL

## 2014-10-09 MED ORDER — MAGNESIUM SULFATE IN D5W 10-5 MG/ML-% IV SOLN
1.0000 g | Freq: Once | INTRAVENOUS | Status: AC
  Administered 2014-10-09: 1 g via INTRAVENOUS
  Filled 2014-10-09: qty 100

## 2014-10-09 MED ORDER — FENTANYL CITRATE 0.05 MG/ML IJ SOLN
25.0000 ug | INTRAMUSCULAR | Status: DC | PRN
  Administered 2014-10-09 – 2014-10-10 (×4): 100 ug via INTRAVENOUS
  Filled 2014-10-09 (×5): qty 2

## 2014-10-09 MED ORDER — INSULIN ASPART 100 UNIT/ML ~~LOC~~ SOLN
2.0000 [IU] | SUBCUTANEOUS | Status: DC
  Administered 2014-10-09: 4 [IU] via SUBCUTANEOUS
  Administered 2014-10-09: 6 [IU] via SUBCUTANEOUS
  Administered 2014-10-10 – 2014-10-11 (×6): 2 [IU] via SUBCUTANEOUS

## 2014-10-09 MED ORDER — SODIUM CHLORIDE 0.9 % IV BOLUS (SEPSIS): 1000.0000 mL | Freq: Once | INTRAVENOUS | Status: DC

## 2014-10-09 MED ORDER — SODIUM CHLORIDE 0.9 % IV SOLN
INTRAVENOUS | Status: DC
  Filled 2014-10-09: qty 2.5

## 2014-10-09 NOTE — Procedures (Signed)
Central Venous Catheter Insertion Procedure Note Greg [name] [id number] [birth year]  Procedure: Insertion of Central Venous Catheter Indications: Drug and/or fluid administration  Procedure Details Consent: Risks of procedure as well as the alternatives and risks of each were explained to the (patient/caregiver).  Consent for procedure obtained. Time Out: Verified patient identification, verified procedure, site/side was marked, verified correct patient position, special equipment/implants available, medications/allergies/relevent history reviewed, required imaging and test results available.  Performed  Maximum sterile technique was used including antiseptics, cap, gloves, gown, hand hygiene, mask and sheet. Skin prep: Chlorhexidine; local anesthetic administered A antimicrobial bonded/coated triple lumen catheter was placed in the left internal jugular vein using the Seldinger technique.  Evaluation Blood flow good Complications: No apparent complications Patient did tolerate procedure well. Chest X-ray ordered to verify placement.  CXR: pending.  Greg [name] 10/09/2014, 2:32 PM  US guidance Need more access  Greg [name]. Titus Mould, MD, [place] Pgr: [facility]

## 2014-10-09 NOTE — Consult Note (Signed)
PULMONARY / CRITICAL CARE MEDICINE   Name: [name] MRN: [id number] DOB: 1932/04/29    ADMISSION DATE:  09/22/2014 CONSULTATION DATE:  10/08/2014  REFERRING MD :  TRH  CHIEF COMPLAINT:  Post op respiratory failure  INITIAL PRESENTATION:  Greg [name] is a 81 y.o. male, With history of type 2 diabetes mellitus, chronic kidney disease stage III Baseline creatinine around 2.3, essential hypertension, sleep apnea, left bundle branch block, systolic heart failure EF now 20-25%, who [facility] which he complains of weakness and fall,he was found to have acute on chronic respiratory failure with acute on chronic systolic and diastolic CHF, new onset atrial fibrillation and anemia. He was seen by cardiology, he was initially placed on dobutamine drip along with diuretics for atrial fibrillation and CHF, he was also seen by GI for anemia workup and colonoscopy relieved colonic mass. He was thought to be high risk for surgery at [facility] and transferred here for further care. He also developed acute renal failure and renal was following over therefore medical treatment as well. Developed L arm DVT there as well.    STUDIES:  Colonoscopy with a colon mass L arm DVT 2/16 CT of the abdomen with wall thickening of the ascending colon. 2/19 total colectomy, left intubated.   SUBJECTIVE:  10/09/2014: on diprivan gtt - WUA - agirtated. Also, on levophed gtt, amio gtt + milrinone gtt + heparin gtt.      VITAL SIGNS: Temp:  [97.4 F (36.3 C)-98.8 F (37.1 C)] 97.6 F (36.4 C) (02/20 0717) Pulse Rate:  [31-109] 33 (02/20 0645) Resp:  [0-28] 13 (02/20 0715) BP: (72-145)/(21-93) 125/50 mmHg (02/20 0715) SpO2:  [96 %-100 %] 100 % (02/20 0700) Arterial Line BP: (80-157)/(44-97) 80/74 mmHg (02/19 2000) FiO2 (%):  [50 %] 50 % (02/20 0430) Weight:  [95.482 kg (210 lb 8 oz)] 95.482 kg (210 lb 8 oz) (02/20 0615) HEMODYNAMICS: CVP:  [4 mmHg-9 mmHg] 7  mmHg VENTILATOR SETTINGS: Vent Mode:  [-] PRVC FiO2 (%):  [50 %] 50 % Set Rate:  [10 bmp] 10 bmp Vt Set:  [600 mL] 600 mL PEEP:  [5 cmH20] 5 cmH20 Plateau Pressure:  [16 cmH20-20 cmH20] 20 cmH20 INTAKE / OUTPUT:  Intake/Output Summary (Last 24 hours) at 10/09/14 3976 Last data filed at 10/09/14 0700  Gross per 24 hour  Intake 5720.58 ml  Output    825 ml  Net 4895.58 ml    PHYSICAL EXAMINATION: General:  Chronically  And critically ill appearing elderly male, sedated on vent. Neuro:  WUA in progerss - agitated over night HEENT:  [place]/AT, PERRL, EOM-I and MMM. Cardiovascular:  RRR, Nl S1/S2, -M/R/G. Lungs:  Coarse BS diffusely. Abdomen:  Soft, NT, ND and +BS. Musculoskeletal:  -edema and -tenderness. Skin:  Intact, wound clean.  LABS:  CBC  Recent Labs Lab 10/08/14 0617 10/08/14 1143 10/08/14 1337 10/09/14 0403  WBC 5.3  --  6.3 8.2  HGB 8.8* 9.2* 8.8* 8.6*  HCT 28.7* 27.0* 28.0* 27.2*  PLT 310  --  283 315   Coag's No results for input(s): APTT, INR in the last 168 hours. BMET  Recent Labs Lab 10/08/14 0617 10/08/14 1143 10/08/14 1337 10/09/14 0403  NA 135 133* 137 133*  K 4.2 4.4 4.0 3.9  CL 93*  --  95* 95*  CO2 38*  --  30 32  BUN 71*  --  68* 62*  CREATININE 2.60*  --  2.62* 2.49*  GLUCOSE 133*  --  187* 213*   Electrolytes  Recent Labs Lab 10/08/14 0617 10/08/14 1337 10/09/14 0403  CALCIUM 8.8 9.2 8.5  MG  --   --  1.8  PHOS  --   --  4.5   Sepsis Markers No results for input(s): LATICACIDVEN, PROCALCITON, O2SATVEN in the last 168 hours. ABG  Recent Labs Lab 10/08/14 1143 10/08/14 1350 10/09/14 0535  PHART 7.566* 7.467* 7.455*  PCO2ART 45.2* 43.2 42.9  PO2ART 375.0* 98.0 160.0*   Liver Enzymes No results for input(s): AST, ALT, ALKPHOS, BILITOT, ALBUMIN in the last 168 hours. Cardiac Enzymes No results for input(s): TROPONINI, PROBNP in the last 168 hours. Glucose  Recent Labs Lab 10/08/14 1340 10/08/14 1618  10/08/14 1922 10/08/14 2159 10/08/14 2353 10/09/14 0715  GLUCAP 183* 194* 184* 170* 168* 203*   Imaging Dg Chest Port 1 View  10/08/2014   CLINICAL DATA:  Hypoxia  EXAM: PORTABLE CHEST - 1 VIEW  COMPARISON:  October 07, 2014  FINDINGS: Endotracheal tube tip is 5.1 cm above carina. Central catheter tip is in the superior vena cava. Nasogastric tube and side port are below the diaphragm. No pneumothorax. There is bibasilar alveolar consolidation with effusions bilaterally. Heart is enlarged. The pulmonary vascularity is normal.  IMPRESSION: Tube and catheter positions as described without pneumothorax. Findings consistent with a degree of congestive heart failure. Superimposed pneumonia in the bases cannot be excluded. Both entities may exist concurrently.   Electronically Signed   By: Lowella Grip III M.D.   On: 10/08/2014 15:28   ASSESSMENT / PLAN:  PULMONARY OETT 2/19>>>  A: VDRF post op,  Was at high risk for this even a-priori.    - Does not meet sbt/extubation criteria due to ongoing medical issues - circulatory shockk/delirium  P:   -  Full vent support. - advanced et tube by 3cm done by RT    CARDIOVASCULAR CVL R IJ DLC in OR>>>  A:  #Baseline   - chronic systolic chf  #curernt Post op hypotension.     - still on levophed but could be due to diprivan, chf ef 25%, On amio gtt _ milrinonine and levophed  P:  - IVF cut down to kvo - dc diprivan - hopefully will come off levophed - check PCT sepsis profile and lacate - MAP goal > 55 with sbp > 95  RENAL A:  Stage 3 CKD with baseline Cr of 2.3.  Remains about the same now. Mild low mag P:   - reduce fluids to kvo - 1gm mag sulfate - BMET in AM. - Replace electrolytes as indicated. - Monitor Cr while on levophed.  GASTROINTESTINAL A:  ?Colon cancer.  S/P total colectomy with an ileostomy placement.   ? Can have TF   P:   - F/U on path. - NPO for now. - ?if will need TPN v TF , will defer GI tract use  to surgery.  HEMATOLOGIC A:  DVT in the LUE.- admission preop problem   - on heparin IV gtt per primary team P:  - Will hold heparin for now until cleared by surgery. - Follow coags.  INFECTIOUS A:  No evidence of active infection right now. P:   Abx: ceftin for post surgical prophylaxis. Check pct/lactate due to ongoing pressor needs  ENDOCRINE A:  DM   P:   - ICU hyperglycemia protocol  NEUROLOGIC A:  Reports of agitation on WUA  P:   RASS goal: 0 - Fentanyl pushes.  prn - DC diprivan   FAMILY  - Updates: no family present bedside.    TODAY'S SUMMARY: 81 year old male, colonic mass, post op respiratory failure.  PCCM to take over.     The patient is critically ill with multiple organ systems failure and requires high complexity decision making for assessment and support, frequent evaluation and titration of therapies, application of advanced monitoring technologies and extensive interpretation of multiple databases.   Critical Care Time devoted to patient care services described in this note is  40  Minutes. This time reflects time of care of this signee Dr Brand Males. This critical care time does not reflect procedure time, or teaching time or supervisory time of PA/NP/Med student/Med Resident etc but could involve care discussion time    Dr. Brand Males, M.D., [facility].C.P Pulmonary and Critical Care Medicine Staff Physician [place] [facility] Pager: [phone], If no answer or between  15:00h - 7:00h: call [phone]  10/09/2014 9:30 AM

## 2014-10-09 NOTE — Progress Notes (Signed)
PT Cancellation Note  Patient Details Name: Greg [name] MRN: [id number] DOB: [birth year]   Cancelled Treatment:    Reason Eval/Treat Not Completed: Medical issues which prohibited therapy Noted that heparin has been reinitiated this AM. Per departmental guidelines, Physical Therapy is to be held for 24 hours until heparin levels return to a therapeutic range. Will follow up tomorrow.  Ellouise Newer 10/09/2014, 8:33 AM Elayne Snare, [place]

## 2014-10-09 NOTE — Progress Notes (Signed)
SUBJECTIVE:  intubated  OBJECTIVE:   Vitals:   Filed Vitals:   10/09/14 0717 10/09/14 0800 10/09/14 0900 10/09/14 1000  BP:  129/45 118/47 140/46  Pulse:      Temp: 97.6 F (36.4 C)     TempSrc: Oral     Resp:  13 13 13   Height:      Weight:      SpO2:  100%  99%   I&O's:   Intake/Output Summary (Last 24 hours) at 10/09/14 1047 Last data filed at 10/09/14 0700  Gross per 24 hour  Intake 5720.58 ml  Output    825 ml  Net 4895.58 ml   TELEMETRY: Reviewed telemetry pt in atrial fibrillation with CVR:     PHYSICAL EXAM General: Well developed, well nourished, intubated and sedated  Lungs:   Clear bilaterally to auscultation anteriorly Heart:   Irregularly irregular S1 S2 Pulses are 2+ & equal. Abdomen: Bowel sounds are positive, abdomen soft and non-tender without masses  Extremities:   No clubbing, cyanosis or edema.  DP +1     LABS: Basic Metabolic Panel:  Recent Labs  10/08/14 1337 10/09/14 0403  NA 137 133*  K 4.0 3.9  CL 95* 95*  CO2 30 32  GLUCOSE 187* 213*  BUN 68* 62*  CREATININE 2.62* 2.49*  CALCIUM 9.2 8.5  MG  --  1.8  PHOS  --  4.5   Liver Function Tests: No results for input(s): AST, ALT, ALKPHOS, BILITOT, PROT, ALBUMIN in the last 72 hours. No results for input(s): LIPASE, AMYLASE in the last 72 hours. CBC:  Recent Labs  10/08/14 1337 10/09/14 0403  WBC 6.3 8.2  HGB 8.8* 8.6*  HCT 28.0* 27.2*  MCV 90.3 90.4  PLT 283 315   Cardiac Enzymes: No results for input(s): CKTOTAL, CKMB, CKMBINDEX, TROPONINI in the last 72 hours. BNP: Invalid input(s): POCBNP D-Dimer: No results for input(s): DDIMER in the last 72 hours. Hemoglobin A1C:  Recent Labs  10/06/14 1841  HGBA1C 6.5*   Fasting Lipid Panel:  Recent Labs  10/08/14 1337  TRIG 80   Thyroid Function Tests: No results for input(s): TSH, T4TOTAL, T3FREE, THYROIDAB in the last 72 hours.  Invalid input(s): FREET3 Anemia Panel: No results for input(s): VITAMINB12,  FOLATE, FERRITIN, TIBC, IRON, RETICCTPCT in the last 72 hours. Coag Panel:   Lab Results  Component Value Date   INR 1.71* 09/23/2014   INR 1.26 09/22/2014    RADIOLOGY: Ct Abdomen Pelvis Wo Contrast  10/05/2014   CLINICAL DATA:  Known colonic mass on recent colonoscopy  EXAM: CT ABDOMEN AND PELVIS WITHOUT CONTRAST  TECHNIQUE: Multidetector CT imaging of the abdomen and pelvis was performed following the standard protocol without IV contrast.  COMPARISON:  09/02/2013  FINDINGS: Bilateral lower lobe consolidation with associated moderate effusions are seen. No parenchymal nodules are noted.  The gallbladder, spleen, adrenal glands and pancreas are normal in their CT appearance. The liver is predominately homogeneous in attenuation. A few vague areas of decreased attenuation are noted within the medial and lateral segment of the left lobe of the liver best seen on image number 21 of series 2. Given the clinical history the possibility of underlying metastatic disease could not be totally excluded. The kidneys are well visualized bilaterally and reveal no renal calculi or obstructive changes.  The previously described right lateral abdominal wall hernia has been rib reduced and repair. No residual hernia is seen. Some wall thickening is noted in the distal terminal ileum  and also within the proximal ascending colon. These changes in the ascending colon may be related to the known underlying colonic mass. Scattered diverticular change is noted without diverticulitis. No significant pelvic mass lesion is seen. The bladder is well distended. No sizable lymphadenopathy is seen. Degenerative changes of the lumbar spine are seen. No bony lesion is noted.  IMPRESSION: Bilateral moderate pleural effusions with associated lower lobe atelectasis/ consolidation.  Wall thickening in the ascending colon likely related to the patient's given clinical history of colonic mass.  Hypodensities within the liver of uncertain  etiology. The possibility of metastatic disease cannot be totally excluded. Ultrasound of the liver may be helpful in this regard.  Diverticulosis without diverticulitis.  No other focal abnormality is seen.   Electronically Signed   By: Inez Catalina M.D.   On: 10/05/2014 17:03   Dg Chest 2 View  09/22/2014   CLINICAL DATA:  Left anterior shoulder abrasion, Fall.  EXAM: CHEST  2 VIEW  COMPARISON:  09/02/2013  FINDINGS: There is a small to moderate right pleural effusion. Right lower lobe atelectasis or infiltrate. No confluent opacity on the left. Cardiomegaly with vascular congestion. No acute bony abnormality.  IMPRESSION: Small to moderate right pleural effusion with right lower lobe atelectasis or consolidation.  Cardiomegaly, vascular congestion.   Electronically Signed   By: Rolm Baptise M.D.   On: 09/22/2014 08:20   Ct Head Wo Contrast  09/22/2014   CLINICAL DATA:  81 year old male with dizziness and fall this morning when going to the bathroom. Left side head and year injury. Labored breathing. Initial encounter.  EXAM: CT HEAD WITHOUT CONTRAST  TECHNIQUE: Contiguous axial images were obtained from the base of the skull through the vertex without intravenous contrast.  COMPARISON:  None.  FINDINGS: Chronic paranasal sinus surgery and changes of chronic sinusitis. Mastoids are clear.  No visible orbit or scalp soft tissue injury identified. No acute osseous abnormality identified.  Scattered areas of chronic appearing encephalomalacia in the left MCA territory. Chronic lacunar infarct in the left thalamus. Mild ex vacuo enlargement of the left lateral ventricle. No midline shift, mass effect, or evidence of intracranial mass lesion. No acute intracranial hemorrhage identified. No evidence of cortically based acute infarction identified. No suspicious intracranial vascular hyperdensity. Possible small chronic lacunar infarcts in the cerebellar tonsils.  IMPRESSION: 1. No acute intracranial abnormality. No  acute traumatic injury identified. 2. Chronic small and medium-sized vessel ischemia, mostly in the left hemisphere.   Electronically Signed   By: Lars Pinks M.D.   On: 09/22/2014 08:11   US Venous Img Upper Uni Right  09/23/2014   CLINICAL DATA:  Right upper extremity edema, pain  EXAM: RIGHT UPPER EXTREMITY VENOUS DOPPLER ULTRASOUND  TECHNIQUE: Gray-scale sonography with graded compression, as well as color Doppler and duplex ultrasound were performed to evaluate the upper extremity deep venous system from the level of the subclavian vein and including the jugular, axillary, basilic, radial, ulnar and upper cephalic vein. Spectral Doppler was utilized to evaluate flow at rest and with distal augmentation maneuvers.  COMPARISON:  None.  FINDINGS: Contralateral Subclavian Vein: Respiratory phasicity is normal and symmetric with the symptomatic side. No evidence of thrombus. Normal compressibility.  Internal Jugular Vein: No evidence of thrombus. Normal compressibility, respiratory phasicity and response to augmentation.  Subclavian Vein: No evidence of thrombus. Normal compressibility, respiratory phasicity and response to augmentation.  Axillary Vein: Intraluminal hypoechoic thrombus present in the right axillary vein appearing occlusive. Axillary vein is noncompressible.  Cephalic Vein: No evidence of thrombus. Normal compressibility, respiratory phasicity and response to augmentation.  Basilic Vein: No evidence of thrombus. Normal compressibility, respiratory phasicity and response to augmentation.  Brachial Veins: Axillary thrombus extends into the brachial vein which is also occlusive and noncompressible. Brachial vein is duplicated.  Radial Veins: No evidence of thrombus. Normal compressibility, respiratory phasicity and response to augmentation.  Ulnar Veins: No evidence of thrombus. Normal compressibility, respiratory phasicity and response to augmentation.  Venous Reflux:  None visualized.  Other Findings:   Subcutaneous for arm edema evident.  IMPRESSION: Positive exam for right upper extremity axillary and brachial acute occlusive DVT.  These results will be called to the ordering clinician or representative by the Radiology Department at the imaging location.   Electronically Signed   By: Daryll Brod M.D.   On: 09/23/2014 16:02   Dg Chest Port 1 View  10/09/2014   CLINICAL DATA:  ET tube placement  EXAM: PORTABLE CHEST - 1 VIEW  COMPARISON:  10/08/2014  FINDINGS: Endotracheal tube is just below the clavicular heads. Nasogastric tube extends into the stomach. Right jugular central line extends into the upper SVC. Basilar opacities persist bilaterally. Small right pleural effusion persists.  IMPRESSION: Persistent basilar opacities without significant interval change   Electronically Signed   By: Andreas Newport M.D.   On: 10/09/2014 06:24   Dg Chest Port 1 View  10/08/2014   CLINICAL DATA:  Hypoxia  EXAM: PORTABLE CHEST - 1 VIEW  COMPARISON:  October 07, 2014  FINDINGS: Endotracheal tube tip is 5.1 cm above carina. Central catheter tip is in the superior vena cava. Nasogastric tube and side port are below the diaphragm. No pneumothorax. There is bibasilar alveolar consolidation with effusions bilaterally. Heart is enlarged. The pulmonary vascularity is normal.  IMPRESSION: Tube and catheter positions as described without pneumothorax. Findings consistent with a degree of congestive heart failure. Superimposed pneumonia in the bases cannot be excluded. Both entities may exist concurrently.   Electronically Signed   By: Lowella Grip III M.D.   On: 10/08/2014 15:28   Dg Chest Port 1 View  10/07/2014   CLINICAL DATA:  Shortness of breath  EXAM: PORTABLE CHEST - 1 VIEW  COMPARISON:  09/22/2014  FINDINGS: Cardiac shadow remains enlarged. Bilateral pleural effusions are noted right greater than left. Given some variation in the patient positioning the overall appearance is stable. No new focal infiltrate  is seen.  IMPRESSION: Stable effusions.  No new focal abnormality is noted.   Electronically Signed   By: Inez Catalina M.D.   On: 10/07/2014 07:56   Dg Shoulder Left  09/22/2014   CLINICAL DATA:  Fall last night with abrasion and injury to left shoulder. Initial encounter.  EXAM: LEFT SHOULDER - 2+ VIEW  COMPARISON:  None.  FINDINGS: No acute fracture or dislocation identified. There are mild degenerative changes involving the [facility] joint and glenohumeral joint. No bony lesions or destruction.  IMPRESSION: No acute fracture identified.   Electronically Signed   By: Aletta Edouard M.D.   On: 09/22/2014 08:18     81 y/o male with h/o LBBB, type 2 diabetes mellitus, obesity, stage III renal disease, and cardiomyopathy with prior LVEF of 40-45%. Admitted to [facility] 09/22/14 for acute hypoxic respiratory failure 2/2 acute on chronic systolic + diastolic CHF. Repeat 2D echo demonstrated severely reduced EF of 20-25%. Also with new diagnosis of atrial flutter and RUE DVT, now on IV heparin. Also w/ severe anemia. Required multiple transfusions. FOBT  positive. Colonoscopy revealed a circumferential firm mass with friable surfaces a the hepatic flexure. Transferred to [facility] 10/06/14 for possible surgery.   Assessment/Plan  Principal Problem:  Colonic mass Active Problems:  Anemia of chronic disease  Chronic renal insufficiency, stage III (moderate)  Abnormal EKG- known IVCD (LBBB type)  Fall  Acute respiratory failure with hypoxia  Atrial fibrillation/ atrilal flutter- new  Elevated troponin  Acute on chronic renal insufficiency  Hyperkalemia  Elevated LFTs  Acute combined systolic and diastolic congestive heart failure  Type 2 diabetes mellitus with renal manifestations  Cardiomyopathy-etiology undetermined- EF 40-45% by echo Jan 2015  Anemia due to GI blood loss  Protein-calorie malnutrition, severe  PUD (peptic ulcer disease)  Acute renal failure syndrome  New onset atrial  fibrillation   1. Acute on Chronic Systolic CHF: EF reduced down to 20-25%: started on low dose dobutamine at [facility] by Dr. Bronson Ing and had good diuresis. Loss 32 lbs. Now on oral torsemide, 30 mg daily.   He is net -23L.  Remains on Levophed and Milrinone gtt.  Need to follow closely for volume overload.  Not sure weights are accurate.  Weight appears to be up 10 lbs but CVP this am was 7 and no edema on chest xray.  2. Atrial Flutter: rate is controlled. Continue Coreg. Heparin restarted  3. RUE DVT: On IV Heparin gtt  4. Acute on Chronic Renal Failure: SCr 2.49 today. Baseline ~2.3. Continue diuresis and monitor. Avoid nephrotoxins, no ACE/ARB.  5. Colonic Mass: POD # 1 from partial colectomy with ileostomy for right colonic mass and liver mass   LOS: 15 days    Greg Margarita, MD  10/09/2014  10:47 AM

## 2014-10-09 NOTE — Progress Notes (Signed)
1 Day Post-Op  Subjective: Patient remains intubated, barely arousable Appreciate CCM management. Large ileostomy output already.  Objective: Vital signs in last 24 hours: Temp:  [97.4 F (36.3 C)-98.8 F (37.1 C)] 97.6 F (36.4 C) (02/20 0717) Pulse Rate:  [31-109] 33 (02/20 0645) Resp:  [0-28] 13 (02/20 0715) BP: (72-145)/(21-93) 125/50 mmHg (02/20 0715) SpO2:  [96 %-100 %] 100 % (02/20 0700) Arterial Line BP: (80-157)/(44-97) 80/74 mmHg (02/19 2000) FiO2 (%):  [50 %] 50 % (02/20 0430) Weight:  [210 lb 8 oz (95.482 kg)] 210 lb 8 oz (95.482 kg) (02/20 0615) Last BM Date: 10/04/14  Intake/Output from previous day: 02/19 0701 - 02/20 0700 In: 5720.6 [I.V.:5350.6; NG/GT:120; IV Piggyback:250] Out: 825 [Urine:725; Blood:100] Intake/Output this shift:    General appearance: intubated, minimally arousable Resp: coarse breath sounds bilaterally Cardio: regular rate and rhythm, S1, S2 normal, no murmur, click, rub or gallop GI: soft, mildly tender to palpation Wound - clean through honeycomb dressing;  ileostomy pink with large liquid stool output  Lab Results:   Recent Labs  10/08/14 1337 10/09/14 0403  WBC 6.3 8.2  HGB 8.8* 8.6*  HCT 28.0* 27.2*  PLT 283 315   BMET  Recent Labs  10/08/14 1337 10/09/14 0403  NA 137 133*  K 4.0 3.9  CL 95* 95*  CO2 30 32  GLUCOSE 187* 213*  BUN 68* 62*  CREATININE 2.62* 2.49*  CALCIUM 9.2 8.5   PT/INR No results for input(s): LABPROT, INR in the last 72 hours. ABG  Recent Labs  10/08/14 1350 10/09/14 0535  PHART 7.467* 7.455*  HCO3 31.4* 30.1*    Studies/Results: Dg Chest Port 1 View  10/09/2014   CLINICAL DATA:  ET tube placement  EXAM: PORTABLE CHEST - 1 VIEW  COMPARISON:  10/08/2014  FINDINGS: Endotracheal tube is just below the clavicular heads. Nasogastric tube extends into the stomach. Right jugular central line extends into the upper SVC. Basilar opacities persist bilaterally. Small right pleural effusion  persists.  IMPRESSION: Persistent basilar opacities without significant interval change   Electronically Signed   By: Andreas Newport M.D.   On: 10/09/2014 06:24   Dg Chest Port 1 View  10/08/2014   CLINICAL DATA:  Hypoxia  EXAM: PORTABLE CHEST - 1 VIEW  COMPARISON:  October 07, 2014  FINDINGS: Endotracheal tube tip is 5.1 cm above carina. Central catheter tip is in the superior vena cava. Nasogastric tube and side port are below the diaphragm. No pneumothorax. There is bibasilar alveolar consolidation with effusions bilaterally. Heart is enlarged. The pulmonary vascularity is normal.  IMPRESSION: Tube and catheter positions as described without pneumothorax. Findings consistent with a degree of congestive heart failure. Superimposed pneumonia in the bases cannot be excluded. Both entities may exist concurrently.   Electronically Signed   By: Lowella Grip III M.D.   On: 10/08/2014 15:28    Anti-infectives: Anti-infectives    Start     Dose/Rate Route Frequency Ordered Stop   10/08/14 1100  ertapenem (INVANZ) 1 g in sodium chloride 0.9 % 50 mL IVPB     1 g 100 mL/hr over 30 Minutes Intravenous To Surgery 10/08/14 1053 10/08/14 1111   10/08/14 0600  cefoTEtan (CEFOTAN) 2 g in dextrose 5 % 50 mL IVPB     2 g 100 mL/hr over 30 Minutes Intravenous On call to O.R. 10/07/14 0806 10/09/14 0559      Assessment/Plan: s/p Procedure(s): EXPLORATOROY LAPAROTOMY (N/A) PARTIAL COLECTOMY WITH ILEOSTOMY (N/A) Vent management per CCM  OK to start tube feeds if patient is not extubated.     LOS: 17 days    Murrel Bertram K. 10/09/2014

## 2014-10-09 NOTE — Progress Notes (Signed)
INITIAL NUTRITION ASSESSMENT  INTERVENTION: If unable to wean as anticipated and pt deemed appropriate for enteral feeds: Initiate Vital High Protein @ 10 ml/hr via OGT and increase by 10 ml every 6 hours to goal rate of  60 ml/hr.   Tube feeding regimen provides 1440 kcal (79% of energy and 100% protein needs), 125 grams of protein, and 1180 ml of H2O.    NUTRITION DIAGNOSIS: Inadequate oral intake related to altered GI function as evidenced by s/p exploatory laparotomy, partial colctomy with ileostomy  Goal: Pt to meet >/= 90% of their estimated nutrition needs    Monitor:  Nutrition support measures (EN vs TPN), respiratory status, I/O's and weight changes  Reason for Assessment: mechanical ventilation  81 y.o. male  Admitting Dx: Colonic mass  ASSESSMENT: Patient is currently intubated on ventilator support 10/08/14 @ 1310. He is s/p exploratory laparotomy, partial colectomy with ileostomy. Pt has central line. Question per MD note TPN vs TF?? Pt has severe weight change since admission related to fluid changes.  MV: 7.6  L/min Temp (24hrs), Avg:98 F (36.7 C), Min:97.4 F (36.3 C), Max:98.8 F (37.1 C)  Propofol: 15.9 ml/hr (420 kcal lipids q 24 hr at current rate)  Labs: Mag and phos WNL  Height: Ht Readings from Last 1 Encounters:  10/06/14 5\' 11"  (1.803 m)    Weight: Wt Readings from Last 1 Encounters:  10/09/14 210 lb 8 oz (95.482 kg)    Ideal Body Weight: 172# (78 kg)  % Ideal Body Weight: 123%  Wt Readings from Last 10 Encounters:  10/09/14 210 lb 8 oz (95.482 kg)  10/02/13 247 lb 6.4 oz (112.22 kg)  09/15/13 273 lb 9.5 oz (124.1 kg)  09/07/13 273 lb 14.4 oz (124.24 kg)  08/28/10 273 lb 12.8 oz (124.195 kg)  08/28/10 273 lb 12.8 oz (124.195 kg)    Usual Body Weight: 273# ??? (severe edema per nursing)  BMI:  Body mass index is 29.37 kg/(m^2).overweight  Estimated Nutritional Needs: Kcal: 1823 Protein: 114-142 gr Fluid: >1700 ml  daily  Skin: surgical incision    Diet Order: Diet NPO time specified  EDUCATION NEEDS: -No education needs identified at this time   Intake/Output Summary (Last 24 hours) at 10/09/14 1040 Last data filed at 10/09/14 0700  Gross per 24 hour  Intake 5720.58 ml  Output    825 ml  Net 4895.58 ml    Last BM: output noted  Labs:   Recent Labs Lab 10/08/14 0617 10/08/14 1143 10/08/14 1337 10/09/14 0403  NA 135 133* 137 133*  K 4.2 4.4 4.0 3.9  CL 93*  --  95* 95*  CO2 38*  --  30 32  BUN 71*  --  68* 62*  CREATININE 2.60*  --  2.62* 2.49*  CALCIUM 8.8  --  9.2 8.5  MG  --   --   --  1.8  PHOS  --   --   --  4.5  GLUCOSE 133*  --  187* 213*    CBG (last 3)   Recent Labs  10/08/14 2159 10/08/14 2353 10/09/14 0715  GLUCAP 170* 168* 203*    Scheduled Meds: . antiseptic oral rinse  7 mL Mouth Rinse QID  . chlorhexidine  15 mL Mouth Rinse BID  . insulin aspart  2-6 Units Subcutaneous 6 times per day  . levothyroxine  62.5 mcg Intravenous QAC breakfast  . magnesium sulfate 1 - 4 g bolus IVPB  1 g Intravenous Once  .  mupirocin cream   Topical BID  . pantoprazole (PROTONIX) IV  40 mg Intravenous Q12H  . polyethylene glycol  17 g Oral Daily  . sodium chloride  2 spray Each Nare QID  . sodium chloride  1,000 mL Intravenous Once  . torsemide  30 mg Oral Daily    Continuous Infusions: . sodium chloride 10 mL/hr (10/09/14 1000)  . amiodarone 30 mg/hr (10/09/14 0700)  . heparin 1,600 Units/hr (10/09/14 0700)  . insulin (NOVOLIN-R) infusion    . milrinone 0.375 mcg/kg/min (10/09/14 0700)  . norepinephrine (LEVOPHED) Adult infusion 12 mcg/min (10/09/14 0700)    Past Medical History  Diagnosis Date  . Type 2 diabetes mellitus   . Anemia of chronic disease   . CKD (chronic kidney disease) stage 3, GFR 30-59 ml/min   . Herpes zoster   . Obesity   . Exposure to hazardous substance   . Intermittent Leukopenia   . Essential hypertension   . Sleep apnea      Sleep Apnea score of 7  . Cardiomyopathy     LVEF 40-45% January 2015  . Left bundle branch block     Past Surgical History  Procedure Laterality Date  . Nasal polyp surgery    . Ventral hernia repair N/A 09/14/2013    Procedure: HERNIA REPAIR VENTRAL ADULT WITH MESH;  Surgeon: Jamesetta So, MD;  Location: [facility];  Service: General;  Laterality: N/A;  . Insertion of mesh N/A 09/14/2013    Procedure: INSERTION OF MESH;  Surgeon: Jamesetta So, MD;  Location: [facility];  Service: General;  Laterality: N/A;  . Colonoscopy N/A 10/04/2014    Procedure: COLONOSCOPY;  Surgeon: Danie Binder, MD;  Location: [facility];  Service: Endoscopy;  Laterality: N/A;  . Esophagogastroduodenoscopy N/A 10/04/2014    Procedure: ESOPHAGOGASTRODUODENOSCOPY (EGD);  Surgeon: Danie Binder, MD;  Location: [facility];  Service: Endoscopy;  Laterality: N/A;    Colman Cater MS,RD,CSG,LDN Office: [phone] Pager: [phone]

## 2014-10-09 NOTE — Progress Notes (Signed)
ANTICOAGULATION CONSULT NOTE - Follow Up Consult  Pharmacy Consult for heparin Indication: atrial fibrillation and DVT in RUE  Allergies  Allergen Reactions  . Celery Oil     vomiting    Patient Measurements: Height: 5\' 11"  (180.3 cm) Weight: 210 lb 8 oz (95.482 kg) IBW/kg (Calculated) : 75.3 Heparin Dosing Weight: 90 kg  Vital Signs: Temp: 98 F (36.7 C) (02/20 1105) Temp Source: Axillary (02/20 1105) BP: 140/46 mmHg (02/20 1000) Pulse Rate: 33 (02/20 0645)  Labs:  Recent Labs  10/08/14 0617 10/08/14 1143 10/08/14 1337 10/09/14 0402 10/09/14 0403 10/09/14 1005  HGB 8.8* 9.2* 8.8*  --  8.6*  --   HCT 28.7* 27.0* 28.0*  --  27.2*  --   PLT 310  --  283  --  315  --   HEPARINUNFRC 0.13*  --   --  <0.10*  --  0.51  CREATININE 2.60*  --  2.62*  --  2.49*  --     Estimated Creatinine Clearance: 27.9 mL/min (by C-G formula based on Cr of 2.49).  Assessment: [name] admitted with new onset Afib & transfusion dependent anemia, FOBT x3 as outpatient. Anticoagulation was initially held pending GI work-up. Venous doppler ultrasound on 09/23/14 was positive for right upper extremity axillary and brachial acute occlusive DVT and pharmacy was asked to initiate heparin.   He is now s/p OR (partial colectomy w/ ileostomy and liver biopsy) on 2/19. Heparin resumed back on 2/20 at 2am per CCS.  Heparin level is therapeutic at 0.51.  CBC remains stable, no bleeding documented.  RN reported that heparin will be d/ced for ~1 hrs today for IJ placement.  Since level was therapeutic this morning, recommend to resume back at 1600 units/hr after procedure.  Goal of Therapy:  Heparin level 0.3-0.7 units/ml Monitor platelets by anticoagulation protocol: Yes   Plan:  - continue heparin drip at 1600 units - monitor for s/s of bleeding  Axtyn Woehler P 10/09/2014,12:34 PM

## 2014-10-10 ENCOUNTER — Inpatient Hospital Stay (HOSPITAL_COMMUNITY): Payer: Medicare Other

## 2014-10-10 DIAGNOSIS — R579 Shock, unspecified: Secondary | ICD-10-CM

## 2014-10-10 LAB — BASIC METABOLIC PANEL
ANION GAP: 10 (ref 5–15)
BUN: 56 mg/dL — ABNORMAL HIGH (ref 6–23)
CO2: 29 mmol/L (ref 19–32)
Calcium: 8.5 mg/dL (ref 8.4–10.5)
Chloride: 93 mmol/L — ABNORMAL LOW (ref 96–112)
Creatinine, Ser: 2.56 mg/dL — ABNORMAL HIGH (ref 0.50–1.35)
GFR calc Af Amer: 26 mL/min — ABNORMAL LOW (ref >90)
GFR calc non Af Amer: 22 mL/min — ABNORMAL LOW (ref >90)
Glucose, Bld: 151 mg/dL — ABNORMAL HIGH (ref 70–99)
POTASSIUM: 4 mmol/L (ref 3.5–5.1)
SODIUM: 132 mmol/L — AB (ref 135–145)

## 2014-10-10 LAB — HEPATIC FUNCTION PANEL
ALT: 26 U/L (ref 0–53)
AST: 21 U/L (ref 0–37)
Albumin: 2.5 g/dL — ABNORMAL LOW (ref 3.5–5.2)
Alkaline Phosphatase: 56 U/L (ref 39–117)
BILIRUBIN INDIRECT: 0.6 mg/dL (ref 0.3–0.9)
BILIRUBIN TOTAL: 0.8 mg/dL (ref 0.3–1.2)
Bilirubin, Direct: 0.2 mg/dL (ref 0.0–0.5)
Total Protein: 5.1 g/dL — ABNORMAL LOW (ref 6.0–8.3)

## 2014-10-10 LAB — GLUCOSE, CAPILLARY
GLUCOSE-CAPILLARY: 103 mg/dL — AB (ref 70–99)
GLUCOSE-CAPILLARY: 125 mg/dL — AB (ref 70–99)
GLUCOSE-CAPILLARY: 128 mg/dL — AB (ref 70–99)
GLUCOSE-CAPILLARY: 130 mg/dL — AB (ref 70–99)
Glucose-Capillary: 101 mg/dL — ABNORMAL HIGH (ref 70–99)
Glucose-Capillary: 135 mg/dL — ABNORMAL HIGH (ref 70–99)
Glucose-Capillary: 136 mg/dL — ABNORMAL HIGH (ref 70–99)

## 2014-10-10 LAB — TYPE AND SCREEN
ABO/RH(D): A POS
ANTIBODY SCREEN: NEGATIVE
UNIT DIVISION: 0
Unit division: 0

## 2014-10-10 LAB — BRAIN NATRIURETIC PEPTIDE: B Natriuretic Peptide: 155.8 pg/mL — ABNORMAL HIGH (ref 0.0–100.0)

## 2014-10-10 LAB — CBC
HCT: 25.8 % — ABNORMAL LOW (ref 39.0–52.0)
Hemoglobin: 8 g/dL — ABNORMAL LOW (ref 13.0–17.0)
MCH: 28 pg (ref 26.0–34.0)
MCHC: 31 g/dL (ref 30.0–36.0)
MCV: 90.2 fL (ref 78.0–100.0)
Platelets: 280 10*3/uL (ref 150–400)
RBC: 2.86 MIL/uL — ABNORMAL LOW (ref 4.22–5.81)
RDW: 18.6 % — ABNORMAL HIGH (ref 11.5–15.5)
WBC: 7.4 10*3/uL (ref 4.0–10.5)

## 2014-10-10 LAB — HEPARIN LEVEL (UNFRACTIONATED): Heparin Unfractionated: 0.48 IU/mL (ref 0.30–0.70)

## 2014-10-10 LAB — MAGNESIUM: MAGNESIUM: 1.9 mg/dL (ref 1.5–2.5)

## 2014-10-10 LAB — PHOSPHORUS: PHOSPHORUS: 5.3 mg/dL — AB (ref 2.3–4.6)

## 2014-10-10 LAB — PROCALCITONIN: PROCALCITONIN: 0.45 ng/mL

## 2014-10-10 LAB — LACTIC ACID, PLASMA: Lactic Acid, Venous: 1 mmol/L (ref 0.5–2.0)

## 2014-10-10 MED ORDER — CETYLPYRIDINIUM CHLORIDE 0.05 % MT LIQD: 7.0000 mL | Freq: Two times a day (BID) | OROMUCOSAL | Status: DC

## 2014-10-10 MED ORDER — TORSEMIDE 50 MG/5ML IV SOLN: 20.0000 mg | Freq: Every day | INTRAVENOUS | Status: DC

## 2014-10-10 MED ORDER — MAGNESIUM SULFATE IN D5W 10-5 MG/ML-% IV SOLN
1.0000 g | Freq: Once | INTRAVENOUS | Status: AC
  Administered 2014-10-10: 1 g via INTRAVENOUS
  Filled 2014-10-10: qty 100

## 2014-10-10 MED ORDER — ENOXAPARIN SODIUM 100 MG/ML ~~LOC~~ SOLN
95.0000 mg | SUBCUTANEOUS | Status: DC
  Administered 2014-10-10 – 2014-10-16 (×7): 95 mg via SUBCUTANEOUS
  Filled 2014-10-10 (×9): qty 1

## 2014-10-10 MED ORDER — CHLORHEXIDINE GLUCONATE 0.12 % MT SOLN: 15.0000 mL | Freq: Two times a day (BID) | OROMUCOSAL | Status: DC

## 2014-10-10 MED ORDER — FUROSEMIDE 10 MG/ML IJ SOLN
40.0000 mg | Freq: Once | INTRAMUSCULAR | Status: AC
  Administered 2014-10-10: 40 mg via INTRAVENOUS
  Filled 2014-10-10: qty 4

## 2014-10-10 MED ORDER — TORSEMIDE 50 MG/5ML IV SOLN
20.0000 mg | INTRAVENOUS | Status: DC
  Filled 2014-10-10: qty 2

## 2014-10-10 MED ORDER — FUROSEMIDE 10 MG/ML IJ SOLN
40.0000 mg | Freq: Every day | INTRAMUSCULAR | Status: DC
  Administered 2014-10-10 – 2014-10-11 (×2): 40 mg via INTRAVENOUS
  Filled 2014-10-10 (×3): qty 4

## 2014-10-10 NOTE — Progress Notes (Signed)
ANTICOAGULATION CONSULT NOTE - Follow Up Consult  Pharmacy Consult for heparin --> lovenox Indication: atrial fibrillation and DVT in RUE  Allergies  Allergen Reactions  . Celery Oil     vomiting    Patient Measurements: Height: 5\' 11"  (180.3 cm) Weight: 209 lb 7 oz (95 kg) IBW/kg (Calculated) : 75.3  Vital Signs: Temp: 98.7 F (37.1 C) (02/21 0718) Temp Source: Axillary (02/21 0718) BP: 125/41 mmHg (02/21 0900) Pulse Rate: 65 (02/21 0800)  Labs:  Recent Labs  10/08/14 1337 10/09/14 0402 10/09/14 0403 10/09/14 1005 10/10/14 0415  HGB 8.8*  --  8.6*  --  8.0*  HCT 28.0*  --  27.2*  --  25.8*  PLT 283  --  315  --  280  HEPARINUNFRC  --  <0.10*  --  0.51 0.48  CREATININE 2.62*  --  2.49*  --  2.56*    Estimated Creatinine Clearance: 27.1 mL/min (by C-G formula based on Cr of 2.56).  Assessment: [name] admitted with new onset Afib & transfusion dependent anemia, FOBT x3 as outpatient. Anticoagulation was initially held pending GI work-up. Venous doppler ultrasound on 09/23/14 was positive for right upper extremity axillary and brachial acute occlusive DVT and pharmacy was asked to initiate heparin.   He is now s/p OR (partial colectomy w/ ileostomy and liver biopsy) on 2/19. Heparin resumed back on 2/20 at 2am per CCS.  With CHF to transition heparin to lovenox today per CCM to minimize fluid intake.  Scr 2.56 (crcl~27)  Goal of Therapy:  Anti-Xa level 0.6-1 units/ml 4hrs after LMWH dose given Monitor platelets by anticoagulation protocol: Yes   Plan:  - d/c heparin drip - start lovenox 95mg  SQ q24h (first dose to be given 1 hr after heparin drip has been d/ced) -monitor renal function and adjust dose when appropriate  Valora Norell P 10/10/2014,10:25 AM

## 2014-10-10 NOTE — Progress Notes (Signed)
Extubated @ 1510 per RT. Placed on bipap per RT. Tolerated extubation well currently 02 sats 100. A&Ox3. Will cont. To monitor.

## 2014-10-10 NOTE — Plan of Care (Signed)
Problem: Phase II Progression Outcomes Goal: Time pt extubated/weaned off vent Outcome: Completed/Met Date Met:  10/10/14 1510     

## 2014-10-10 NOTE — Progress Notes (Signed)
2 Days Post-Op  Subjective: Intubated, responsive, remains on mili/levophed, on heparin  Objective: Vital signs in last 24 hours: Temp:  [98 F (36.7 C)-98.8 F (37.1 C)] 98.7 F (37.1 C) (02/21 0718) Pulse Rate:  [35-115] 97 (02/21 0700) Resp:  [0-22] 13 (02/21 0700) BP: (82-140)/(30-70) 114/46 mmHg (02/21 0700) SpO2:  [96 %-100 %] 98 % (02/21 0700) FiO2 (%):  [30 %-40 %] 30 % (02/21 0434) Weight:  [209 lb 7 oz (95 kg)] 209 lb 7 oz (95 kg) (02/21 0600) Last BM Date: 10/04/14  Intake/Output from previous day: 02/20 0701 - 02/21 0700 In: 2703.2 [I.V.:2363.2; NG/GT:240; IV Piggyback:100] Out: 2485 [Urine:2085; Stool:400] Intake/Output this shift:    GI: ileostomy pink and functional with stool and air in bag, honeycomb dressing on wound appears without infection  Lab Results:   Recent Labs  10/09/14 0403 10/10/14 0415  WBC 8.2 7.4  HGB 8.6* 8.0*  HCT 27.2* 25.8*  PLT 315 280   BMET  Recent Labs  10/09/14 0403 10/10/14 0415  NA 133* 132*  K 3.9 4.0  CL 95* 93*  CO2 32 29  GLUCOSE 213* 151*  BUN 62* 56*  CREATININE 2.49* 2.56*  CALCIUM 8.5 8.5   PT/INR No results for input(s): LABPROT, INR in the last 72 hours. ABG  Recent Labs  10/08/14 1350 10/09/14 0535  PHART 7.467* 7.455*  HCO3 31.4* 30.1*    Studies/Results: Dg Chest Port 1 View  10/10/2014   CLINICAL DATA:  ET tube placement  EXAM: PORTABLE CHEST - 1 VIEW  COMPARISON:  10/09/2014  FINDINGS: The endotracheal tube is just below the level of the clavicular heads. The nasogastric tube extends into the stomach. There is a left jugular central line with tip in the upper SVC. No pneumothorax. New there is a small right pleural effusion. There are mild basilar opacities bilaterally without significant interval change.  IMPRESSION: Persistent right effusion and mild bibasilar airspace opacities without significant interval change.   Electronically Signed   By: Andreas Newport M.D.   On: 10/10/2014 06:36    Dg Chest Port 1 View  10/09/2014   CLINICAL DATA:  Shock, central line placement, diabetes, hypertension, cardiomyopathy  EXAM: PORTABLE CHEST - 1 VIEW  COMPARISON:  Portable exam 1513 hours compared to 0555 hours  FINDINGS: Tip of endotracheal tube projects 6.7 cm above carina.  Nasogastric tube extends into stomach.  BILATERAL internal jugular catheters with tips projecting over proximal SVC.  Minimal enlargement of cardiac silhouette.  Mediastinal contours and pulmonary vascularity normal.  Bibasilar effusions and minimal atelectasis.  Upper lungs clear.  No pneumothorax.  Bones demineralized.  IMPRESSION: Line and tube positions as above.  Bibasilar effusions and atelectasis little changed.   Electronically Signed   By: Lavonia Dana M.D.   On: 10/09/2014 15:54   Dg Chest Port 1 View  10/09/2014   CLINICAL DATA:  ET tube placement  EXAM: PORTABLE CHEST - 1 VIEW  COMPARISON:  10/08/2014  FINDINGS: Endotracheal tube is just below the clavicular heads. Nasogastric tube extends into the stomach. Right jugular central line extends into the upper SVC. Basilar opacities persist bilaterally. Small right pleural effusion persists.  IMPRESSION: Persistent basilar opacities without significant interval change   Electronically Signed   By: Andreas Newport M.D.   On: 10/09/2014 06:24   Dg Chest Port 1 View  10/08/2014   CLINICAL DATA:  Hypoxia  EXAM: PORTABLE CHEST - 1 VIEW  COMPARISON:  October 07, 2014  FINDINGS: Endotracheal tube  tip is 5.1 cm above carina. Central catheter tip is in the superior vena cava. Nasogastric tube and side port are below the diaphragm. No pneumothorax. There is bibasilar alveolar consolidation with effusions bilaterally. Heart is enlarged. The pulmonary vascularity is normal.  IMPRESSION: Tube and catheter positions as described without pneumothorax. Findings consistent with a degree of congestive heart failure. Superimposed pneumonia in the bases cannot be excluded. Both entities  may exist concurrently.   Electronically Signed   By: Lowella Grip III M.D.   On: 10/08/2014 15:28    Anti-infectives: Anti-infectives    Start     Dose/Rate Route Frequency Ordered Stop   10/08/14 1100  ertapenem (INVANZ) 1 g in sodium chloride 0.9 % 50 mL IVPB     1 g 100 mL/hr over 30 Minutes Intravenous To Surgery 10/08/14 1053 10/08/14 1111   10/08/14 0600  cefoTEtan (CEFOTAN) 2 g in dextrose 5 % 50 mL IVPB     2 g 100 mL/hr over 30 Minutes Intravenous On call to O.R. 10/07/14 0806 10/09/14 0559      Assessment/Plan: POD 2 colectomy  1. Can start tube feeds as tolerated per ccm 2. Await pathology 3. Appreciate ccm care   [facility] 10/10/2014

## 2014-10-10 NOTE — Progress Notes (Signed)
SUBJECTIVE:  intubated  OBJECTIVE:   Vitals:   Filed Vitals:   10/10/14 0630 10/10/14 0700 10/10/14 0718 10/10/14 0900  BP: 109/43 114/46    Pulse: 81 97    Temp:   98.7 F (37.1 C)   TempSrc:   Axillary   Resp: 20 13    Height:      Weight:      SpO2: 99% 98%  100%   I&O's:   Intake/Output Summary (Last 24 hours) at 10/10/14 0951 Last data filed at 10/10/14 0800  Gross per 24 hour  Intake 2357.8 ml  Output   2650 ml  Net -292.2 ml   TELEMETRY: Reviewed telemetry pt in atrial fibrillation with CVR:     PHYSICAL EXAM General: intubated  Lungs:   Clear bilaterally to auscultation anteriorly Heart:   Irregularly irregular S1 S2 Pulses are 2+ & equal. Extremities:   No clubbing, cyanosis or edema.  DP +1  LABS: Basic Metabolic Panel:  Recent Labs  10/09/14 0403 10/10/14 0415  NA 133* 132*  K 3.9 4.0  CL 95* 93*  CO2 32 29  GLUCOSE 213* 151*  BUN 62* 56*  CREATININE 2.49* 2.56*  CALCIUM 8.5 8.5  MG 1.8 1.9  PHOS 4.5 5.3*   Liver Function Tests:  Recent Labs  10/10/14 0415  AST 21  ALT 26  ALKPHOS 56  BILITOT 0.8  PROT 5.1*  ALBUMIN 2.5*   No results for input(s): LIPASE, AMYLASE in the last 72 hours. CBC:  Recent Labs  10/09/14 0403 10/10/14 0415  WBC 8.2 7.4  HGB 8.6* 8.0*  HCT 27.2* 25.8*  MCV 90.4 90.2  PLT 315 280   Cardiac Enzymes: No results for input(s): CKTOTAL, CKMB, CKMBINDEX, TROPONINI in the last 72 hours. BNP: Invalid input(s): POCBNP D-Dimer: No results for input(s): DDIMER in the last 72 hours. Hemoglobin A1C: No results for input(s): HGBA1C in the last 72 hours. Fasting Lipid Panel:  Recent Labs  10/08/14 1337  TRIG 80   Thyroid Function Tests: No results for input(s): TSH, T4TOTAL, T3FREE, THYROIDAB in the last 72 hours.  Invalid input(s): FREET3 Anemia Panel: No results for input(s): VITAMINB12, FOLATE, FERRITIN, TIBC, IRON, RETICCTPCT in the last 72 hours. Coag Panel:   Lab Results  Component Value  Date   INR 1.71* 09/23/2014   INR 1.26 09/22/2014    RADIOLOGY: Ct Abdomen Pelvis Wo Contrast  10/05/2014   CLINICAL DATA:  Known colonic mass on recent colonoscopy  EXAM: CT ABDOMEN AND PELVIS WITHOUT CONTRAST  TECHNIQUE: Multidetector CT imaging of the abdomen and pelvis was performed following the standard protocol without IV contrast.  COMPARISON:  09/02/2013  FINDINGS: Bilateral lower lobe consolidation with associated moderate effusions are seen. No parenchymal nodules are noted.  The gallbladder, spleen, adrenal glands and pancreas are normal in their CT appearance. The liver is predominately homogeneous in attenuation. A few vague areas of decreased attenuation are noted within the medial and lateral segment of the left lobe of the liver best seen on image number 21 of series 2. Given the clinical history the possibility of underlying metastatic disease could not be totally excluded. The kidneys are well visualized bilaterally and reveal no renal calculi or obstructive changes.  The previously described right lateral abdominal wall hernia has been rib reduced and repair. No residual hernia is seen. Some wall thickening is noted in the distal terminal ileum and also within the proximal ascending colon. These changes in the ascending colon may be related  to the known underlying colonic mass. Scattered diverticular change is noted without diverticulitis. No significant pelvic mass lesion is seen. The bladder is well distended. No sizable lymphadenopathy is seen. Degenerative changes of the lumbar spine are seen. No bony lesion is noted.  IMPRESSION: Bilateral moderate pleural effusions with associated lower lobe atelectasis/ consolidation.  Wall thickening in the ascending colon likely related to the patient's given clinical history of colonic mass.  Hypodensities within the liver of uncertain etiology. The possibility of metastatic disease cannot be totally excluded. Ultrasound of the liver may be  helpful in this regard.  Diverticulosis without diverticulitis.  No other focal abnormality is seen.   Electronically Signed   By: Inez Catalina M.D.   On: 10/05/2014 17:03   Dg Chest 2 View  09/22/2014   CLINICAL DATA:  Left anterior shoulder abrasion, Fall.  EXAM: CHEST  2 VIEW  COMPARISON:  09/02/2013  FINDINGS: There is a small to moderate right pleural effusion. Right lower lobe atelectasis or infiltrate. No confluent opacity on the left. Cardiomegaly with vascular congestion. No acute bony abnormality.  IMPRESSION: Small to moderate right pleural effusion with right lower lobe atelectasis or consolidation.  Cardiomegaly, vascular congestion.   Electronically Signed   By: Rolm Baptise M.D.   On: 09/22/2014 08:20   Ct Head Wo Contrast  09/22/2014   CLINICAL DATA:  81 year old male with dizziness and fall this morning when going to the bathroom. Left side head and year injury. Labored breathing. Initial encounter.  EXAM: CT HEAD WITHOUT CONTRAST  TECHNIQUE: Contiguous axial images were obtained from the base of the skull through the vertex without intravenous contrast.  COMPARISON:  None.  FINDINGS: Chronic paranasal sinus surgery and changes of chronic sinusitis. Mastoids are clear.  No visible orbit or scalp soft tissue injury identified. No acute osseous abnormality identified.  Scattered areas of chronic appearing encephalomalacia in the left MCA territory. Chronic lacunar infarct in the left thalamus. Mild ex vacuo enlargement of the left lateral ventricle. No midline shift, mass effect, or evidence of intracranial mass lesion. No acute intracranial hemorrhage identified. No evidence of cortically based acute infarction identified. No suspicious intracranial vascular hyperdensity. Possible small chronic lacunar infarcts in the cerebellar tonsils.  IMPRESSION: 1. No acute intracranial abnormality. No acute traumatic injury identified. 2. Chronic small and medium-sized vessel ischemia, mostly in the left  hemisphere.   Electronically Signed   By: Lars Pinks M.D.   On: 09/22/2014 08:11   US Venous Img Upper Uni Right  09/23/2014   CLINICAL DATA:  Right upper extremity edema, pain  EXAM: RIGHT UPPER EXTREMITY VENOUS DOPPLER ULTRASOUND  TECHNIQUE: Gray-scale sonography with graded compression, as well as color Doppler and duplex ultrasound were performed to evaluate the upper extremity deep venous system from the level of the subclavian vein and including the jugular, axillary, basilic, radial, ulnar and upper cephalic vein. Spectral Doppler was utilized to evaluate flow at rest and with distal augmentation maneuvers.  COMPARISON:  None.  FINDINGS: Contralateral Subclavian Vein: Respiratory phasicity is normal and symmetric with the symptomatic side. No evidence of thrombus. Normal compressibility.  Internal Jugular Vein: No evidence of thrombus. Normal compressibility, respiratory phasicity and response to augmentation.  Subclavian Vein: No evidence of thrombus. Normal compressibility, respiratory phasicity and response to augmentation.  Axillary Vein: Intraluminal hypoechoic thrombus present in the right axillary vein appearing occlusive. Axillary vein is noncompressible.  Cephalic Vein: No evidence of thrombus. Normal compressibility, respiratory phasicity and response to augmentation.  Basilic  Vein: No evidence of thrombus. Normal compressibility, respiratory phasicity and response to augmentation.  Brachial Veins: Axillary thrombus extends into the brachial vein which is also occlusive and noncompressible. Brachial vein is duplicated.  Radial Veins: No evidence of thrombus. Normal compressibility, respiratory phasicity and response to augmentation.  Ulnar Veins: No evidence of thrombus. Normal compressibility, respiratory phasicity and response to augmentation.  Venous Reflux:  None visualized.  Other Findings:  Subcutaneous for arm edema evident.  IMPRESSION: Positive exam for right upper extremity axillary and  brachial acute occlusive DVT.  These results will be called to the ordering clinician or representative by the Radiology Department at the imaging location.   Electronically Signed   By: Daryll Brod M.D.   On: 09/23/2014 16:02   Dg Chest Port 1 View  10/10/2014   CLINICAL DATA:  ET tube placement  EXAM: PORTABLE CHEST - 1 VIEW  COMPARISON:  10/09/2014  FINDINGS: The endotracheal tube is just below the level of the clavicular heads. The nasogastric tube extends into the stomach. There is a left jugular central line with tip in the upper SVC. No pneumothorax. New there is a small right pleural effusion. There are mild basilar opacities bilaterally without significant interval change.  IMPRESSION: Persistent right effusion and mild bibasilar airspace opacities without significant interval change.   Electronically Signed   By: Andreas Newport M.D.   On: 10/10/2014 06:36   Dg Chest Port 1 View  10/09/2014   CLINICAL DATA:  Shock, central line placement, diabetes, hypertension, cardiomyopathy  EXAM: PORTABLE CHEST - 1 VIEW  COMPARISON:  Portable exam 1513 hours compared to 0555 hours  FINDINGS: Tip of endotracheal tube projects 6.7 cm above carina.  Nasogastric tube extends into stomach.  BILATERAL internal jugular catheters with tips projecting over proximal SVC.  Minimal enlargement of cardiac silhouette.  Mediastinal contours and pulmonary vascularity normal.  Bibasilar effusions and minimal atelectasis.  Upper lungs clear.  No pneumothorax.  Bones demineralized.  IMPRESSION: Line and tube positions as above.  Bibasilar effusions and atelectasis little changed.   Electronically Signed   By: Lavonia Dana M.D.   On: 10/09/2014 15:54   Dg Chest Port 1 View  10/09/2014   CLINICAL DATA:  ET tube placement  EXAM: PORTABLE CHEST - 1 VIEW  COMPARISON:  10/08/2014  FINDINGS: Endotracheal tube is just below the clavicular heads. Nasogastric tube extends into the stomach. Right jugular central line extends into the  upper SVC. Basilar opacities persist bilaterally. Small right pleural effusion persists.  IMPRESSION: Persistent basilar opacities without significant interval change   Electronically Signed   By: Andreas Newport M.D.   On: 10/09/2014 06:24   Dg Chest Port 1 View  10/08/2014   CLINICAL DATA:  Hypoxia  EXAM: PORTABLE CHEST - 1 VIEW  COMPARISON:  October 07, 2014  FINDINGS: Endotracheal tube tip is 5.1 cm above carina. Central catheter tip is in the superior vena cava. Nasogastric tube and side port are below the diaphragm. No pneumothorax. There is bibasilar alveolar consolidation with effusions bilaterally. Heart is enlarged. The pulmonary vascularity is normal.  IMPRESSION: Tube and catheter positions as described without pneumothorax. Findings consistent with a degree of congestive heart failure. Superimposed pneumonia in the bases cannot be excluded. Both entities may exist concurrently.   Electronically Signed   By: Lowella Grip III M.D.   On: 10/08/2014 15:28   Dg Chest Port 1 View  10/07/2014   CLINICAL DATA:  Shortness of breath  EXAM: PORTABLE CHEST -  1 VIEW  COMPARISON:  09/22/2014  FINDINGS: Cardiac shadow remains enlarged. Bilateral pleural effusions are noted right greater than left. Given some variation in the patient positioning the overall appearance is stable. No new focal infiltrate is seen.  IMPRESSION: Stable effusions.  No new focal abnormality is noted.   Electronically Signed   By: Inez Catalina M.D.   On: 10/07/2014 07:56   Dg Shoulder Left  09/22/2014   CLINICAL DATA:  Fall last night with abrasion and injury to left shoulder. Initial encounter.  EXAM: LEFT SHOULDER - 2+ VIEW  COMPARISON:  None.  FINDINGS: No acute fracture or dislocation identified. There are mild degenerative changes involving the [facility] joint and glenohumeral joint. No bony lesions or destruction.  IMPRESSION: No acute fracture identified.   Electronically Signed   By: Aletta Edouard M.D.   On: 09/22/2014 08:18     Assessment/Plan  Principal Problem:  Colonic mass Active Problems:  Anemia of chronic disease  Chronic renal insufficiency, stage III (moderate)  Abnormal EKG- known IVCD (LBBB type)  Fall  Acute respiratory failure with hypoxia  Atrial fibrillation/ atrilal flutter- new  Elevated troponin  Acute on chronic renal insufficiency  Hyperkalemia  Elevated LFTs  Acute combined systolic and diastolic congestive heart failure  Type 2 diabetes mellitus with renal manifestations  Cardiomyopathy-etiology undetermined- EF 40-45% by echo Jan 2015  Anemia due to GI blood loss  Protein-calorie malnutrition, severe  PUD (peptic ulcer disease)  Acute renal failure syndrome  New onset atrial fibrillation   1. Acute on Chronic Systolic CHF: EF reduced down to 20-25%: started on low dose dobutamine at [facility] by Dr. Bronson Ing and had good diuresis. Loss 32 lbs. Now on oral torsemide, 30 mg daily. He is net -23L. Remains on Levophed and Milrinone gtt. Need to follow closely for volume overload. Not sure weights are accurate. Weight appears to be up 10 lbs but CVP this am was 9 and no edema on chest xray but persistent pleural effusion.  BNP significantly improved.  Discussed with Dr. Chase Caller.  Will give Lasix IV this am since he is NPO for extubation and then reassess in am to see if needs any further IV diuretic or switch to PO.  Wean Levophed off as BP tolerates.  2. Atrial Flutter: rate is controlled. Continue Coreg/IV Amio. Heparin restarted  3. RUE DVT: On IV Heparin gtt  4. Acute on Chronic Renal Failure: SCr 2.56 today. Baseline ~2.3. Continue diuresis and monitor. Avoid nephrotoxins, no ACE/ARB.  5. Colonic Mass: POD # 1 from partial colectomy with ileostomy for right colonic mass and liver mass    Sueanne Margarita, MD  10/10/2014  9:51 AM

## 2014-10-10 NOTE — Progress Notes (Signed)
PULMONARY / CRITICAL CARE MEDICINE   Name: Greg [name] MRN: [id number] DOB: 1931-12-29    ADMISSION DATE:  09/22/2014 CONSULTATION DATE:  10/08/2014  REFERRING MD :  TRH  CHIEF COMPLAINT:  Post op respiratory failure  INITIAL PRESENTATION:  Greg [name] is a 81 y.o. male, With history of type 2 diabetes mellitus, chronic kidney disease stage III Baseline creatinine around 2.3, essential hypertension, sleep apnea, left bundle branch block, systolic heart failure EF now 20-25%, who [facility] which he complains of weakness and fall,he was found to have acute on chronic respiratory failure with acute on chronic systolic and diastolic CHF, new onset atrial fibrillation and anemia. He was seen by cardiology, he was initially placed on dobutamine drip along with diuretics for atrial fibrillation and CHF, he was also seen by GI for anemia workup and colonoscopy relieved colonic mass. He was thought to be high risk for surgery at [facility] and transferred here for further care. He also developed acute renal failure and renal was following over therefore medical treatment as well. Developed L arm DVT there as well.    STUDIES:  Colonoscopy with a colon mass L arm DVT 2/16 CT of the abdomen with wall thickening of the ascending colon. 2/19 total colectomy, left intubated. 10/09/2014: on diprivan gtt - WUA - agirtated. Also, on levophed gtt, amio gtt + milrinone gtt + heparin gtt.     SUBJECTIVE/OVERNIGHT/INTERVAL HX 10/10/14: Off diprivan x 24h, On fent prn only. Calm and following commands on WUA. On levophed 10 + amio gtt + milrinone + Heparin gtt  + demadex 30mg  daily IV   VITAL SIGNS: Temp:  [98 F (36.7 C)-98.8 F (37.1 C)] 98.7 F (37.1 C) (02/21 0718) Pulse Rate:  [35-115] 97 (02/21 0700) Resp:  [0-22] 13 (02/21 0700) BP: (82-140)/(30-70) 114/46 mmHg (02/21 0700) SpO2:  [96 %-100 %] 100 % (02/21 0900) FiO2 (%):  [30 %-40 %] 30 % (02/21  0829) Weight:  [95 kg (209 lb 7 oz)] 95 kg (209 lb 7 oz) (02/21 0600) HEMODYNAMICS: CVP:  [5 mmHg-9 mmHg] 9 mmHg VENTILATOR SETTINGS: Vent Mode:  [-] PSV;CPAP FiO2 (%):  [30 %-40 %] 30 % Set Rate:  [10 bmp] 10 bmp Vt Set:  [600 mL] 600 mL PEEP:  [5 cmH20] 5 cmH20 Pressure Support:  [5 cmH20] 5 cmH20 Plateau Pressure:  [15 cmH20-22 cmH20] 18 cmH20 INTAKE / OUTPUT:  Intake/Output Summary (Last 24 hours) at 10/10/14 3664 Last data filed at 10/10/14 0800  Gross per 24 hour  Intake 2357.8 ml  Output   2650 ml  Net -292.2 ml    PHYSICAL EXAMINATION: General:  Chronically  And critically ill appearing elderly male, sedated on vent. Neuro:  WUA on fent prn - RASS -1, CAM-ICU neg for delirium. Moves all 4s HEENT:  [place]/AT, PERRL, EOM-I and MMM. Cardiovascular:  RRR, Nl S1/S2, -M/R/G. Lungs:  Coarse BS diffusely.no crackles or wheeze Abdomen:  Soft, NT, ND and +BS. Musculoskeletal:  -edema and -tenderness. Skin:  Intact, wound clean.  LABS: PULMONARY  Recent Labs Lab 10/08/14 1143 10/08/14 1350 10/09/14 0535  PHART 7.566* 7.467* 7.455*  PCO2ART 45.2* 43.2 42.9  PO2ART 375.0* 98.0 160.0*  HCO3 41.2* 31.4* 30.1*  TCO2 43 33 31  O2SAT 100.0 98.0 100.0    CBC  Recent Labs Lab 10/08/14 1337 10/09/14 0403 10/10/14 0415  HGB 8.8* 8.6* 8.0*  HCT 28.0* 27.2* 25.8*  WBC 6.3 8.2 7.4  PLT 283  315 280    COAGULATION No results for input(s): INR in the last 168 hours.  CARDIAC  No results for input(s): TROPONINI in the last 168 hours. No results for input(s): PROBNP in the last 168 hours.   CHEMISTRY  Recent Labs Lab 10/07/14 0613 10/08/14 0617 10/08/14 1143 10/08/14 1337 10/09/14 0403 10/10/14 0415  NA 136 135 133* 137 133* 132*  K 4.5 4.2 4.4 4.0 3.9 4.0  CL 91* 93*  --  95* 95* 93*  CO2 38* 38*  --  30 32 29  GLUCOSE 133* 133*  --  187* 213* 151*  BUN 77* 71*  --  68* 62* 56*  CREATININE 2.62* 2.60*  --  2.62* 2.49* 2.56*  CALCIUM 8.5 8.8  --  9.2 8.5  8.5  MG  --   --   --   --  1.8 1.9  PHOS  --   --   --   --  4.5 5.3*   Estimated Creatinine Clearance: 27.1 mL/min (by C-G formula based on Cr of 2.56).   LIVER  Recent Labs Lab 10/10/14 0415  AST 21  ALT 26  ALKPHOS 56  BILITOT 0.8  PROT 5.1*  ALBUMIN 2.5*     INFECTIOUS  Recent Labs Lab 10/09/14 0930 10/10/14 0415  LATICACIDVEN  --  1.0  PROCALCITON 0.44 0.45     ENDOCRINE CBG (last 3)   Recent Labs  10/09/14 2346 10/10/14 0350 10/10/14 0715  GLUCAP 103* 125* 130*         IMAGING x48h Dg Chest Port 1 View  10/10/2014   CLINICAL DATA:  ET tube placement  EXAM: PORTABLE CHEST - 1 VIEW  COMPARISON:  10/09/2014  FINDINGS: The endotracheal tube is just below the level of the clavicular heads. The nasogastric tube extends into the stomach. There is a left jugular central line with tip in the upper SVC. No pneumothorax. New there is a small right pleural effusion. There are mild basilar opacities bilaterally without significant interval change.  IMPRESSION: Persistent right effusion and mild bibasilar airspace opacities without significant interval change.   Electronically Signed   By: Andreas Newport M.D.   On: 10/10/2014 06:36   Dg Chest Port 1 View  10/09/2014   CLINICAL DATA:  Shock, central line placement, diabetes, hypertension, cardiomyopathy  EXAM: PORTABLE CHEST - 1 VIEW  COMPARISON:  Portable exam 1513 hours compared to 0555 hours  FINDINGS: Tip of endotracheal tube projects 6.7 cm above carina.  Nasogastric tube extends into stomach.  BILATERAL internal jugular catheters with tips projecting over proximal SVC.  Minimal enlargement of cardiac silhouette.  Mediastinal contours and pulmonary vascularity normal.  Bibasilar effusions and minimal atelectasis.  Upper lungs clear.  No pneumothorax.  Bones demineralized.  IMPRESSION: Line and tube positions as above.  Bibasilar effusions and atelectasis little changed.   Electronically Signed   By: Lavonia Dana  M.D.   On: 10/09/2014 15:54   Dg Chest Port 1 View  10/09/2014   CLINICAL DATA:  ET tube placement  EXAM: PORTABLE CHEST - 1 VIEW  COMPARISON:  10/08/2014  FINDINGS: Endotracheal tube is just below the clavicular heads. Nasogastric tube extends into the stomach. Right jugular central line extends into the upper SVC. Basilar opacities persist bilaterally. Small right pleural effusion persists.  IMPRESSION: Persistent basilar opacities without significant interval change   Electronically Signed   By: Andreas Newport M.D.   On: 10/09/2014 06:24   Dg Chest Port 1 View  10/08/2014  CLINICAL DATA:  Hypoxia  EXAM: PORTABLE CHEST - 1 VIEW  COMPARISON:  October 07, 2014  FINDINGS: Endotracheal tube tip is 5.1 cm above carina. Central catheter tip is in the superior vena cava. Nasogastric tube and side port are below the diaphragm. No pneumothorax. There is bibasilar alveolar consolidation with effusions bilaterally. Heart is enlarged. The pulmonary vascularity is normal.  IMPRESSION: Tube and catheter positions as described without pneumothorax. Findings consistent with a degree of congestive heart failure. Superimposed pneumonia in the bases cannot be excluded. Both entities may exist concurrently.   Electronically Signed   By: Lowella Grip III M.D.   On: 10/08/2014 15:28        ASSESSMENT / PLAN:  PULMONARY OETT 2/19>>>  A: VDRF post op,  Was at high risk for this even a-priori.    - Does meet sbt/extubation criteria but has  ongoing medical issues - circulatory shock - rapidly improving  P:   Extubate after 1 dose diuretic to bipap Stay on bipap for 24h    CARDIOVASCULAR CVL R IJ DLC in OR>>>  A:  #Baseline   - chronic systolic chf  #curernt Post op hypotension.     - still on levophed radpildy improving (PCT does not suggest sepsis as cause)  - On going chf ef 25%, On amio gtt _ milrinonine and levophed with lasix.   P:  - IVF kvo - - MAP goal > 55 with sbp > 95; wean  levophed off  RENAL A:  Stage 3 CKD with baseline Cr of 2.3.  Remains about the same now. Mild low mag. Making urine  P:   -  kvo fluids - 1gm mag sulfate - BMET in AM.   GASTROINTESTINAL A:  ?Colon cancer.  S/P total colectomy with an ileostomy placement.   cleared for TF by [facility] but going to extubate   P:   - F/U on path. - NPO for now.   HEMATOLOGIC A:  DVT in the LUE.- admission preop problem   - on heparin IV gtt per primary team P:  - Will hold heparin for now until cleared by surgery.; consider SQ lovenox to consolidate volume  - Follow coags.  INFECTIOUS A:  No evidence of active infection right now. P:   Abx: ceftin for post surgical prophylaxis. Check pct/lactate due to ongoing pressor needs  ENDOCRINE A:  DM   P:   - ICU hyperglycemia protocol  NEUROLOGIC A:  Reports of agitation on WUA 10/09/14    - No delirium 10/10/14, RASS -1  P:   RASS goal: 0 - Fentanyl pushes. prn   FAMILY  - Updates: no family present bedside.    TODAY'S SUMMARY: 81 year old male, colonic mass, post op respiratory failure.  PCCM primary. Extubate to BiPAP. Due to chf and pressor needs at risk for reintubation     The patient is critically ill with multiple organ systems failure and requires high complexity decision making for assessment and support, frequent evaluation and titration of therapies, application of advanced monitoring technologies and extensive interpretation of multiple databases.   Critical Care Time devoted to patient care services described in this note is  35 Minutes. This time reflects time of care of this signee Dr Brand Males. This critical care time does not reflect procedure time, or teaching time or supervisory time of PA/NP/Med student/Med Resident etc but could involve care discussion time    Dr. Brand Males, M.D., [facility] Pulmonary and Critical Care Medicine Staff  Physician [facility] Pager: [phone], If no answer or between  15:00h - 7:00h: call [phone]  10/10/2014 9:18 AM

## 2014-10-10 NOTE — Progress Notes (Signed)
Patient extubated @ 1510 placed on bipap. Tolerating well 02 sats 100%. CVP 5-9. Diuresed with 55ml lasix UOP 754ml. Converted to NSR @ 1500, Currently in A-Fib. Easily arousable. Unable to turn levophed below 4 mcg/hr. Lung clear upper lobes, coarse to rhonchi lower lobes. Will cont. To monitor

## 2014-10-11 LAB — CBC
HEMATOCRIT: 22.1 % — AB (ref 39.0–52.0)
Hemoglobin: 7 g/dL — ABNORMAL LOW (ref 13.0–17.0)
MCH: 29.2 pg (ref 26.0–34.0)
MCHC: 31.7 g/dL (ref 30.0–36.0)
MCV: 92.1 fL (ref 78.0–100.0)
Platelets: 235 10*3/uL (ref 150–400)
RBC: 2.4 MIL/uL — ABNORMAL LOW (ref 4.22–5.81)
RDW: 18.8 % — AB (ref 11.5–15.5)
WBC: 6.3 10*3/uL (ref 4.0–10.5)

## 2014-10-11 LAB — BASIC METABOLIC PANEL
ANION GAP: 8 (ref 5–15)
BUN: 50 mg/dL — ABNORMAL HIGH (ref 6–23)
CHLORIDE: 94 mmol/L — AB (ref 96–112)
CO2: 29 mmol/L (ref 19–32)
Calcium: 8.2 mg/dL — ABNORMAL LOW (ref 8.4–10.5)
Creatinine, Ser: 2.44 mg/dL — ABNORMAL HIGH (ref 0.50–1.35)
GFR calc Af Amer: 27 mL/min — ABNORMAL LOW (ref >90)
GFR calc non Af Amer: 23 mL/min — ABNORMAL LOW (ref >90)
Glucose, Bld: 115 mg/dL — ABNORMAL HIGH (ref 70–99)
POTASSIUM: 3.6 mmol/L (ref 3.5–5.1)
Sodium: 131 mmol/L — ABNORMAL LOW (ref 135–145)

## 2014-10-11 LAB — GLUCOSE, CAPILLARY
GLUCOSE-CAPILLARY: 104 mg/dL — AB (ref 70–99)
GLUCOSE-CAPILLARY: 207 mg/dL — AB (ref 70–99)
GLUCOSE-CAPILLARY: 211 mg/dL — AB (ref 70–99)
GLUCOSE-CAPILLARY: 75 mg/dL (ref 70–99)
Glucose-Capillary: 126 mg/dL — ABNORMAL HIGH (ref 70–99)
Glucose-Capillary: 154 mg/dL — ABNORMAL HIGH (ref 70–99)

## 2014-10-11 LAB — PHOSPHORUS: Phosphorus: 5 mg/dL — ABNORMAL HIGH (ref 2.3–4.6)

## 2014-10-11 LAB — MAGNESIUM: Magnesium: 2 mg/dL (ref 1.5–2.5)

## 2014-10-11 LAB — PROCALCITONIN: Procalcitonin: 0.44 ng/mL

## 2014-10-11 LAB — OCCULT BLOOD X 1 CARD TO LAB, STOOL: Fecal Occult Bld: NEGATIVE

## 2014-10-11 MED ORDER — PANTOPRAZOLE SODIUM 40 MG PO TBEC
40.0000 mg | DELAYED_RELEASE_TABLET | Freq: Every day | ORAL | Status: DC
  Administered 2014-10-12 – 2014-10-16 (×5): 40 mg via ORAL
  Filled 2014-10-11 (×6): qty 1

## 2014-10-11 MED ORDER — LEVOTHYROXINE SODIUM 25 MCG PO TABS
125.0000 ug | ORAL_TABLET | Freq: Every day | ORAL | Status: DC
  Administered 2014-10-12 – 2014-10-16 (×6): 125 ug via ORAL
  Filled 2014-10-11 (×7): qty 1

## 2014-10-11 MED ORDER — FENTANYL CITRATE 0.05 MG/ML IJ SOLN: 25.0000 ug | INTRAMUSCULAR | Status: DC | PRN

## 2014-10-11 MED ORDER — INSULIN ASPART 100 UNIT/ML ~~LOC~~ SOLN
2.0000 [IU] | SUBCUTANEOUS | Status: DC
  Administered 2014-10-11: 6 [IU] via SUBCUTANEOUS
  Administered 2014-10-11: 4 [IU] via SUBCUTANEOUS
  Administered 2014-10-11: 6 [IU] via SUBCUTANEOUS

## 2014-10-11 NOTE — Progress Notes (Signed)
Inpatient Diabetes Program Recommendations  AACE/ADA: New Consensus Statement on Inpatient Glycemic Control (2013)  Target Ranges:  Prepandial:   less than 140 mg/dL      Peak postprandial:   less than 180 mg/dL (1-2 hours)      Critically ill patients:  140 - 180 mg/dL   Inpatient Diabetes Program Recommendations Diet: Please add carbohydrate modified to heart healthy orders.  Thank you Rosita Kea, RN, MSN, CDE  Diabetes Inpatient Program Office: [phone] Pager: [phone]

## 2014-10-11 NOTE — Progress Notes (Addendum)
Patient Profile: 81 y/o male with h/o LBBB, type 2 diabetes mellitus, obesity, stage III renal disease, and cardiomyopathy with prior LVEF of 40-45%. Admitted to [facility] 09/22/14 for acute hypoxic respiratory failure 2/2 acute on chronic systolic + diastolic CHF. Repeat 2D echo demonstrated severely reduced EF of 20-25%. Also with new diagnosis of atrial flutter and RUE DVT, now on IV heparin. Also w/ severe anemia. Required multiple transfusions. FOBT positive. Colonoscopy revealed a circumferential firm mass with friable surfaces a the hepatic flexure. Transferred to [facility] 10/06/14 for possible surgery. Day 3 s/p exploratory laparotomy with partial colectomy with ileostomy. Post-operative course complicated by acute on chronic CHF.   Subjective: Doing ok. Out of bed and in a chair. Extubated. On BiPAP.   Objective: Vital signs in last 24 hours: Temp:  [97.9 F (36.6 C)-98.7 F (37.1 C)] 97.9 F (36.6 C) (02/22 0700) Pulse Rate:  [32-102] 102 (02/22 0845) Resp:  [0-27] 19 (02/22 0900) BP: (90-133)/(35-65) 133/65 mmHg (02/22 0900) SpO2:  [97 %-100 %] 100 % (02/22 0900) FiO2 (%):  [40 %] 40 % (02/22 0833) Weight:  [205 lb 4 oz (93.1 kg)] 205 lb 4 oz (93.1 kg) (02/22 0421) Last BM Date: 10/10/14  Intake/Output from previous day: 02/21 0701 - 02/22 0700 In: 1289.3 [I.V.:1279.3; IV Piggyback:10] Out: 6629 [Urine:2855; Emesis/NG output:100; Stool:275] Intake/Output this shift: Total I/O In: 92.2 [I.V.:92.2] Out: 25 [Urine:25]  Medications Current Facility-Administered Medications  Medication Dose Route Frequency Provider Last Rate Last Dose  . 0.9 %  sodium chloride infusion  250 mL Intravenous PRN Thurnell Lose, MD      . 0.9 %  sodium chloride infusion   Intravenous Continuous Brand Males, MD 10 mL/hr at 10/11/14 0900    . amiodarone (NEXTERONE PREMIX) 360 MG/200ML (1.8 mg/mL) IV infusion  30 mg/hr Intravenous Continuous Georganna Skeans, MD 16.7 mL/hr at 10/11/14 0900 30 mg/hr at  10/11/14 0900  . antiseptic oral rinse (CPC / CETYLPYRIDINIUM CHLORIDE 0.05%) solution 7 mL  7 mL Mouth Rinse QID Georganna Skeans, MD   7 mL at 10/11/14 0400  . chlorhexidine (PERIDEX) 0.12 % solution 15 mL  15 mL Mouth Rinse BID Georganna Skeans, MD   15 mL at 10/10/14 1947  . enoxaparin (LOVENOX) injection 95 mg  95 mg Subcutaneous Q24H Anh P Pham, RPH   95 mg at 10/10/14 1327  . fentaNYL (SUBLIMAZE) injection 25-200 mcg  25-200 mcg Intravenous Q2H PRN Brand Males, MD   100 mcg at 10/10/14 0001  . furosemide (LASIX) injection 40 mg  40 mg Intravenous Daily Brand Males, MD   40 mg at 10/11/14 0912  . insulin aspart (novoLOG) injection 2-6 Units  2-6 Units Subcutaneous 6 times per day Brand Males, MD   2 Units at 10/11/14 0410  . insulin regular (NOVOLIN R,HUMULIN R) 250 Units in sodium chloride 0.9 % 250 mL (1 Units/mL) infusion   Intravenous Continuous Brand Males, MD   Stopped at 10/09/14 1030  . levothyroxine (SYNTHROID, LEVOTHROID) injection 62.5 mcg  62.5 mcg Intravenous QAC breakfast Rush Farmer, MD   62.5 mcg at 10/10/14 0800  . metoprolol (LOPRESSOR) injection 5 mg  5 mg Intravenous Q4H PRN Thurnell Lose, MD      . milrinone (PRIMACOR) 20 MG/100ML (0.2 mg/mL) infusion  0.375 mcg/kg/min Intravenous Continuous Georganna Skeans, MD 10 mL/hr at 10/11/14 0900 0.375 mcg/kg/min at 10/11/14 0900  . mupirocin cream (BACTROBAN) 2 %   Topical BID [name], MD      .  norepinephrine (LEVOPHED) 4 mg in dextrose 5 % 250 mL (0.016 mg/mL) infusion  0-40 mcg/min Intravenous Titrated Georganna Skeans, MD 11.3 mL/hr at 10/11/14 0900 3.013 mcg/min at 10/11/14 0900  . pantoprazole (PROTONIX) injection 40 mg  40 mg Intravenous Q12H Georganna Skeans, MD   40 mg at 10/11/14 0912  . polyethylene glycol (MIRALAX / GLYCOLAX) packet 17 g  17 g Oral Daily Danie Binder, MD   17 g at 10/10/14 9509  . sodium chloride (OCEAN) 0.65 % nasal spray 2 spray  2 spray Each Nare QID Danie Binder, MD   2  spray at 10/10/14 1947  . sodium chloride 0.9 % bolus 1,000 mL  1,000 mL Intravenous Once Rush Farmer, MD      . sodium chloride 0.9 % injection 3 mL  3 mL Intravenous PRN Samuella Cota, MD   3 mL at 10/02/14 2031  . torsemide (DEMADEX) tablet 30 mg  30 mg Oral Daily Thurnell Lose, MD   30 mg at 10/11/14 0912    PE: General appearance: alert, cooperative and no distress Neck: no carotid bruit and no JVD Lungs: decreased BS at the bases Heart: irregularly irregular rhythm Extremities: no LEE Pulses: 2+ and symmetric Skin: warm and dry Neurologic: Grossly normal  Lab Results:   Recent Labs  10/09/14 0403 10/10/14 0415 10/11/14 0400  WBC 8.2 7.4 6.3  HGB 8.6* 8.0* 7.0*  HCT 27.2* 25.8* 22.1*  PLT 315 280 235   BMET  Recent Labs  10/09/14 0403 10/10/14 0415 10/11/14 0400  NA 133* 132* 131*  K 3.9 4.0 3.6  CL 95* 93* 94*  CO2 32 29 29  GLUCOSE 213* 151* 115*  BUN 62* 56* 50*  CREATININE 2.49* 2.56* 2.44*  CALCIUM 8.5 8.5 8.2*   PT/INR No results for input(s): LABPROT, INR in the last 72 hours. Cholesterol No results for input(s): CHOL in the last 72 hours. Cardiac Enzymes Invalid input(s): TROPONIN,  CKMB  Studies/Results: @RISRSLT2 @   Assessment/Plan  Principal Problem:   Colonic mass Active Problems:   Anemia of chronic disease   Chronic renal insufficiency, stage III (moderate)   Abnormal EKG- known IVCD (LBBB type)   Fall   Acute respiratory failure with hypoxia   Atrial fibrillation/ atrilal flutter- new   Elevated troponin   Acute on chronic renal insufficiency   Hyperkalemia   Elevated LFTs   Acute combined systolic and diastolic congestive heart failure   Type 2 diabetes mellitus with renal manifestations   Cardiomyopathy-etiology undetermined- EF 40-45% by echo Jan 2015   Anemia due to GI blood loss   Protein-calorie malnutrition, severe   PUD (peptic ulcer disease)   Acute renal failure syndrome   New onset atrial  fibrillation   Endotracheal tube present   Shock  1. Acute on Chronic Systolic CHF: EF reduced down to 20-25%. On BiPAP, BID IV Lasix and milrinone. Levophed for hypotension. SBP 94.   2. Atrial Flutter: still in afib/ flutter. Rate is controlled. Continue amiodarone. Transition to PO when able to tolerate PO. Continue IV for now. Lovenox for a/c for now.   3. RUE DVT: on Lovenox.   4. Acute on Chronic Renal Failure: Scr improving down from 2.56 to 2.44.   5.  Colonic Mass: POD #3 exploratory laparotomy with partial colectomy with ileostomy. Management per surgery.    LOS: 19 days    Brittainy M. Ladoris Gene 10/11/2014 9:29 AM   I have personally seen and examined this  patient with Lyda Jester, PA-C. I agree with the assessment and plan as outlined above. He is stable this am. Lasix IV daily until taking po. Continue milrinone and attempt to wean Levophed. Change to po amiodarone when taking po medications well. On Lovenox for upper extremity DVT and atrial flutter. Will need long term anti-coagulation planning.   Vallarie Fei 10/11/2014 11:39 AM

## 2014-10-11 NOTE — Clinical Social Work Note (Addendum)
CSW spoke with patient's wife Alden Benjamin, and asked if she still would like her husband to go to [facility] once he is ready for discharge, and patient's wife said yes.  Patient's wife Lena's gave her cell phone number which is [phone]  Updated clinicals in [facility] and FL2 on chart CSW to continue to follow.  Jones Broom. [place], MSW, [place] 10/11/2014 3:36 PM

## 2014-10-11 NOTE — Consult Note (Signed)
[facility] ostomy consult note Stoma type/location:  Pt had ileostomy surgery performed on 2/19.  Initial consult requested today. Stoma intact to RLE.  Current pouch is leaking. Stomal assessment/size: Stoma red and viable; slightly above skin level, 13/4 inches Peristomal assessment:  Peristomal skin intact Output 50cc dark brown liquid stool Ostomy pouching: 1pc. Education provided:  Applied one piece pouch with barrier ring to maintain seal. Demonstrated pouch application to patient, no family members at bedside during teaching session.  Supplies left at bedside for staff nurse use.  Will perform further educational session when stable and out of ICU.Thank-you,  Julien Girt MSN, [place], [place], [place], [place]

## 2014-10-11 NOTE — Progress Notes (Signed)
[facility] Progress Note 3 Days Post-Op  Subjective: Patient sitting up in chair.  Wants to go home.  Still on Levo, amiodarone, and milrinone.  No acute distress.  Also on BiPAP  Objective: Vital signs in last 24 hours: Temp:  [97.9 F (36.6 C)-98.7 F (37.1 C)] 97.9 F (36.6 C) (02/22 0700) Pulse Rate:  [32-102] 102 (02/22 0845) Resp:  [0-27] 19 (02/22 0900) BP: (90-133)/(35-65) 133/65 mmHg (02/22 0900) SpO2:  [97 %-100 %] 100 % (02/22 0900) FiO2 (%):  [40 %] 40 % (02/22 0833) Weight:  [93.1 kg (205 lb 4 oz)] 93.1 kg (205 lb 4 oz) (02/22 0421) Last BM Date: 10/10/14  Intake/Output from previous day: 02/21 0701 - 02/22 0700 In: 1289.3 [I.V.:1279.3; IV Piggyback:10] Out: 5956 [Urine:2855; Emesis/NG output:100; Stool:275] Intake/Output this shift: Total I/O In: 92.2 [I.V.:92.2] Out: 25 [Urine:25]  General: No acute distress.  Lungs: Clear on BiPAP.  Sats are 100% on BiPAP 40%.  Abd: Soft, good ostomy output.  Pink and viable. Ostomy.  Midline wound is still dressed.  Extremities: No changes  Neuro: Intact  Lab Results:  @LABLAST2 (wbc:2,hgb:2,hct:2,plt:2) BMET  Recent Labs  10/10/14 0415 10/11/14 0400  NA 132* 131*  K 4.0 3.6  CL 93* 94*  CO2 29 29  GLUCOSE 151* 115*  BUN 56* 50*  CREATININE 2.56* 2.44*  CALCIUM 8.5 8.2*   PT/INR No results for input(s): LABPROT, INR in the last 72 hours. ABG  Recent Labs  10/08/14 1350 10/09/14 0535  PHART 7.467* 7.455*  HCO3 31.4* 30.1*    Studies/Results: Dg Chest Port 1 View  10/10/2014   CLINICAL DATA:  ET tube placement  EXAM: PORTABLE CHEST - 1 VIEW  COMPARISON:  10/09/2014  FINDINGS: The endotracheal tube is just below the level of the clavicular heads. The nasogastric tube extends into the stomach. There is a left jugular central line with tip in the upper SVC. No pneumothorax. New there is a small right pleural effusion. There are mild basilar opacities bilaterally without significant interval change.   IMPRESSION: Persistent right effusion and mild bibasilar airspace opacities without significant interval change.   Electronically Signed   By: Andreas Newport M.D.   On: 10/10/2014 06:36   Dg Chest Port 1 View  10/09/2014   CLINICAL DATA:  Shock, central line placement, diabetes, hypertension, cardiomyopathy  EXAM: PORTABLE CHEST - 1 VIEW  COMPARISON:  Portable exam 1513 hours compared to 0555 hours  FINDINGS: Tip of endotracheal tube projects 6.7 cm above carina.  Nasogastric tube extends into stomach.  BILATERAL internal jugular catheters with tips projecting over proximal SVC.  Minimal enlargement of cardiac silhouette.  Mediastinal contours and pulmonary vascularity normal.  Bibasilar effusions and minimal atelectasis.  Upper lungs clear.  No pneumothorax.  Bones demineralized.  IMPRESSION: Line and tube positions as above.  Bibasilar effusions and atelectasis little changed.   Electronically Signed   By: Lavonia Dana M.D.   On: 10/09/2014 15:54    Anti-infectives: Anti-infectives    Start     Dose/Rate Route Frequency Ordered Stop   10/08/14 1100  ertapenem (INVANZ) 1 g in sodium chloride 0.9 % 50 mL IVPB     1 g 100 mL/hr over 30 Minutes Intravenous To Surgery 10/08/14 1053 10/08/14 1111   10/08/14 0600  cefoTEtan (CEFOTAN) 2 g in dextrose 5 % 50 mL IVPB     2 g 100 mL/hr over 30 Minutes Intravenous On call to O.R. 10/07/14 3875 10/09/14 0559      Assessment/Plan:  s/p Procedure(s): EXPLORATOROY LAPAROTOMY PARTIAL COLECTOMY WITH ILEOSTOMY Advance diet  No other changes per surgery.  LOS: 19 days   Kathryne Eriksson. Dahlia Bailiff, MD, FACS [phone] [facility] Surgery 10/11/2014

## 2014-10-11 NOTE — Progress Notes (Signed)
PULMONARY / CRITICAL CARE MEDICINE   Name: Greg [name] MRN: [id number] DOB: [birth year]    ADMISSION DATE:  09/22/2014 CONSULTATION DATE:  10/08/2014  REFERRING MD :  TRH  CHIEF COMPLAINT:  Post op respiratory failure  INITIAL PRESENTATION:  Greg [name] is a 81 y.o. male, With history of type 2 diabetes mellitus, chronic kidney disease stage III Baseline creatinine around 2.3, essential hypertension, sleep apnea, left bundle branch block, systolic heart failure EF now 20-25%, who [facility] which he complains of weakness and fall,he was found to have acute on chronic respiratory failure with acute on chronic systolic and diastolic CHF, new onset atrial fibrillation and anemia. He was seen by cardiology, he was initially placed on dobutamine drip along with diuretics for atrial fibrillation and CHF, he was also seen by GI for anemia workup and colonoscopy relieved colonic mass. He was thought to be high risk for surgery at [facility] and transferred here for further care. He also developed acute renal failure and renal was following over therefore medical treatment as well. Developed L arm DVT there as well.   STUDIES/ SIGNIFICANT EVENTS Colonoscopy with a colon mass L arm DVT 2/16 CT of the abdomen with wall thickening of the ascending colon. 2/19 total colectomy, left intubated. 2/21 extubated to BiPAP 2/22 DC BiPAP   SUBJECTIVE/OVERNIGHT/INTERVAL HX Extubated 2/21 to BiPAP. Comfortable on BiPAP overnight. Off BiPAP 2/22 am and tolerating well. No distress, O2 sats fine. Remains on norepinephrine and milrinone drips. Asking to eat.   VITAL SIGNS: Temp:  [97.9 F (36.6 C)-98.7 F (37.1 C)] 97.9 F (36.6 C) (02/22 0700) Pulse Rate:  [32-121] 121 (02/22 1017) Resp:  [12-27] 20 (02/22 1000) BP: (90-133)/(35-65) 104/57 mmHg (02/22 1000) SpO2:  [97 %-100 %] 100 % (02/22 1000) FiO2 (%):  [40 %] 40 % (02/22 0833) Weight:  [93.1 kg (205 lb 4 oz)] 93.1  kg (205 lb 4 oz) (02/22 0421) HEMODYNAMICS: CVP:  [5 mmHg-7 mmHg] 7 mmHg VENTILATOR SETTINGS: Vent Mode:  [-] PCV;CPAP FiO2 (%):  [40 %] 40 % Set Rate:  [8 bmp] 8 bmp PEEP:  [6 cmH20] 6 cmH20 INTAKE / OUTPUT:  Intake/Output Summary (Last 24 hours) at 10/11/14 1119 Last data filed at 10/11/14 1000  Gross per 24 hour  Intake 1062.6 ml  Output   2785 ml  Net -1722.4 ml    PHYSICAL EXAMINATION: General:  Elderly male sitting in chair with no acute distress Neuro:  Alert, oriented, non-focal HEENT:  [place]/AT, PERRL, EOM-I and MMM. Cardiovascular:  RRR, Nl S1/S2, -M/R/G. Lungs:  Clear breath sounds. Somewhat diminished at bases Abdomen:  Soft, NT, ND and +BS. Midline surgical dressing. Ileostomy stoma appears dark.  Musculoskeletal:  -edema and -tenderness. Skin:  Intact, wound clean.  LABS: PULMONARY  Recent Labs Lab 10/08/14 1143 10/08/14 1350 10/09/14 0535  PHART 7.566* 7.467* 7.455*  PCO2ART 45.2* 43.2 42.9  PO2ART 375.0* 98.0 160.0*  HCO3 41.2* 31.4* 30.1*  TCO2 43 33 31  O2SAT 100.0 98.0 100.0    CBC  Recent Labs Lab 10/09/14 0403 10/10/14 0415 10/11/14 0400  HGB 8.6* 8.0* 7.0*  HCT 27.2* 25.8* 22.1*  WBC 8.2 7.4 6.3  PLT 315 280 235    COAGULATION No results for input(s): INR in the last 168 hours.  CARDIAC  No results for input(s): TROPONINI in the last 168 hours. No results for input(s): PROBNP in the last 168 hours.   CHEMISTRY  Recent Labs Lab  10/08/14 0617 10/08/14 1143 10/08/14 1337 10/09/14 0403 10/10/14 0415 10/11/14 0400  NA 135 133* 137 133* 132* 131*  K 4.2 4.4 4.0 3.9 4.0 3.6  CL 93*  --  95* 95* 93* 94*  CO2 38*  --  30 32 29 29  GLUCOSE 133*  --  187* 213* 151* 115*  BUN 71*  --  68* 62* 56* 50*  CREATININE 2.60*  --  2.62* 2.49* 2.56* 2.44*  CALCIUM 8.8  --  9.2 8.5 8.5 8.2*  MG  --   --   --  1.8 1.9 2.0  PHOS  --   --   --  4.5 5.3* 5.0*   Estimated Creatinine Clearance: 28.1 mL/min (by C-G formula based on Cr of  2.44).   LIVER  Recent Labs Lab 10/10/14 0415  AST 21  ALT 26  ALKPHOS 56  BILITOT 0.8  PROT 5.1*  ALBUMIN 2.5*     INFECTIOUS  Recent Labs Lab 10/09/14 0930 10/10/14 0415 10/11/14 0340  LATICACIDVEN  --  1.0  --   PROCALCITON 0.44 0.45 0.44     ENDOCRINE CBG (last 3)   Recent Labs  10/10/14 2345 10/11/14 0349 10/11/14 0736  GLUCAP 101* 126* 104*    IMAGING x48h Dg Chest Port 1 View  10/10/2014   CLINICAL DATA:  ET tube placement  EXAM: PORTABLE CHEST - 1 VIEW  COMPARISON:  10/09/2014  FINDINGS: The endotracheal tube is just below the level of the clavicular heads. The nasogastric tube extends into the stomach. There is a left jugular central line with tip in the upper SVC. No pneumothorax. New there is a small right pleural effusion. There are mild basilar opacities bilaterally without significant interval change.  IMPRESSION: Persistent right effusion and mild bibasilar airspace opacities without significant interval change.   Electronically Signed   By: Andreas Newport M.D.   On: 10/10/2014 06:36   Dg Chest Port 1 View  10/09/2014   CLINICAL DATA:  Shock, central line placement, diabetes, hypertension, cardiomyopathy  EXAM: PORTABLE CHEST - 1 VIEW  COMPARISON:  Portable exam 1513 hours compared to 0555 hours  FINDINGS: Tip of endotracheal tube projects 6.7 cm above carina.  Nasogastric tube extends into stomach.  BILATERAL internal jugular catheters with tips projecting over proximal SVC.  Minimal enlargement of cardiac silhouette.  Mediastinal contours and pulmonary vascularity normal.  Bibasilar effusions and minimal atelectasis.  Upper lungs clear.  No pneumothorax.  Bones demineralized.  IMPRESSION: Line and tube positions as above.  Bibasilar effusions and atelectasis little changed.   Electronically Signed   By: Lavonia Dana M.D.   On: 10/09/2014 15:54     ASSESSMENT / PLAN:  PULMONARY OETT 2/19>>>  A: VDRF post op (resolved)  P:   D/C  BiPAP Supplemental O2 as needed to maintian SpO2 > 92% IS  CARDIOVASCULAR CVL R IJ DLC in OR>>> A:  Chronic systolic CHF Atrial fibrillation with RVR Post op hypotension  P:  - IVF to KVO - MAP goal > 55 with sbp > 95 - Titrate levophed to off - Amio, milrinone, diuresis per cardiology  RENAL A:   Stage 3 CKD with baseline Cr of 2.3.    P:   kvo fluids Follow Bmet  GASTROINTESTINAL A:   ?Colon cancer.  S/P total colectomy with an ileostomy placement. Liver mass - identified in surgery Nutrition  P:   F/U on path. Start heart healthy diet PO protonix for SUP  HEMATOLOGIC A:  DVT in the LUE.- admission preop problem Anemia, acute on chronic  P:  Continue lovenox for DVT treatment for now May need to hold lovenox and transfuse if hgb falls any further. Most recently 7 (2/22) Guaiac stool Repeat CBC in AM Follow coags  INFECTIOUS A:   No evidence of active infection right now  P:   Monitor WBC and Fever curve Check pct/lactate due to ongoing pressor needs  ENDOCRINE A:   DM Hypothyroidsism    P:   CBG and SSI Synthroid to PO  NEUROLOGIC A:   Acute encephalopathy post-operatively (resolved)  P:   RASS goal: 0   FAMILY  - Updates: updated family and patient 2/22   Georgann Housekeeper, AGACNP-BC [place] Pulmonology/Critical Care Pager [phone] or [phone]  Reviewed above, examined.  Respiratory status improved >> transition off BiPAP.  Still on pressors and milrinone.  Cards to transition to oral amiodarone.  Irregular heart rate.  Basilar fine crackles.  1+ edema.  Updated pt's wife at bedside.  Chesley Mires, MD [facility] Pulmonary/Critical Care 10/11/2014, 12:46 PM Pager:  [phone] After 3pm call: [phone]

## 2014-10-11 NOTE — Evaluation (Signed)
Physical Therapy Evaluation Patient Details Name: Greg [name] MRN: [id number] DOB: 10-12-1931 Today's Date: 10/11/2014   History of Present Illness  Greg [name] is a 81 y.o. male, With history of type 2 diabetes mellitus, chronic kidney disease stage III,essential hypertension, sleep apnea, left bundle branch block, systolic heart failure EF now 20-25%, who was admitted to [facility] with  weakness and fall,he was found to have acute on chronic respiratory failure with acute on chronic systolic and diastolic CHF, new onset atrial fibrillation and anemia. , he was also seen by GI for anemia workup and colonoscopy relieved colonic mass. He was thought to be high risk for surgery at [facility] and transferred here for further care. He also developed acute renal failure, Developed R arm DVT there as well. Pt s/p total colectomy 2/19  Clinical Impression  Pt with decreased function, strength and mobility since initial evaluation. Pt with generalized weakness, decreased balance and all deficits below who will benefit from acute therapy to maximize mobility, function, strength and activity to decrease burden of care.     Follow Up Recommendations SNF;Supervision for mobility/OOB    Equipment Recommendations  None recommended by PT    Recommendations for Other Services OT consult     Precautions / Restrictions Precautions Precautions: Fall Precaution Comments: Recent dx of Rt UE DVT, Bipap      Mobility  Bed Mobility Overal bed mobility: Needs Assistance Bed Mobility: Rolling;Sidelying to Sit Rolling: Max assist Sidelying to sit: Max assist;+2 for physical assistance;HOB elevated       General bed mobility comments: cues for sequence and assist to rotate trunk and pelvis as well as to elevate trunk from surface  Transfers Overall transfer level: Needs assistance   Transfers: Sit to/from Stand Sit to Stand: Max assist;+2 physical assistance;From elevated surface          General transfer comment: pt required max assist for reciprocal scooting to EOB, max +2 with max cues for hand placement and sequence to stand. Pt unable to achieve fully upright with hips flexed and posterior lean with max assist to faciliate and control pelvis to chair for pivot  Ambulation/Gait                Stairs            Wheelchair Mobility    Modified Rankin (Stroke Patients Only)       Balance Overall balance assessment: Needs assistance   Sitting balance-Leahy Scale: Fair       Standing balance-Leahy Scale: Poor                               Pertinent Vitals/Pain Pain Assessment: No/denies pain  HR 121 BP 109/45 in bed BP 92/52 (62) in chair sats 99% on 40% Fio2 Bipap    Home Living Family/patient expects to be discharged to:: Skilled nursing facility Living Arrangements: Spouse/significant other;Children Available Help at Discharge: Family;Available 24 hours/day Type of Home: House Home Access: Stairs to enter Entrance Stairs-Rails: None Entrance Stairs-Number of Steps: 1 Home Layout: Multi-level Home Equipment: Latina Craver - 2 wheels;Bedside commode;Wheelchair - manual      Prior Function Level of Independence: Independent               Hand Dominance        Extremity/Trunk Assessment   Upper Extremity Assessment: Generalized weakness           Lower Extremity  Assessment: Generalized weakness      Cervical / Trunk Assessment: Kyphotic  Communication   Communication: Other (comment) (bipap)  Cognition Arousal/Alertness: Awake/alert Behavior During Therapy: WFL for tasks assessed/performed Overall Cognitive Status: Impaired/Different from baseline Area of Impairment: Problem solving;Safety/judgement;Awareness         Safety/Judgement: Decreased awareness of safety;Decreased awareness of deficits   Problem Solving: Slow processing;Decreased initiation;Difficulty sequencing;Requires tactile  cues;Requires verbal cues      General Comments      Exercises General Exercises - Lower Extremity Long Arc Quad: AAROM;Both;10 reps;Seated      Assessment/Plan    PT Assessment Patient needs continued PT services  PT Diagnosis Generalized weakness;Difficulty walking   PT Problem List Decreased strength;Decreased activity tolerance;Decreased balance;Decreased mobility;Decreased knowledge of use of DME;Decreased cognition  PT Treatment Interventions Gait training;Balance training;Functional mobility training;Therapeutic activities;Therapeutic exercise;Manual techniques;Patient/family education;DME instruction   PT Goals (Current goals can be found in the Care Plan section) Acute Rehab PT Goals Patient Stated Goal: walk and go home PT Goal Formulation: With patient Time For Goal Achievement: 10/25/14 Potential to Achieve Goals: Good    Frequency Min 3X/week   Barriers to discharge Decreased caregiver support      Co-evaluation               End of Session Equipment Utilized During Treatment: Gait belt;Other (comment) (bipap) Activity Tolerance: Patient limited by fatigue;Patient tolerated treatment well Patient left: in chair;with call bell/phone within reach;with nursing/sitter in room Nurse Communication: Mobility status;Precautions         Time: 3220-2542 PT Time Calculation (min) (ACUTE ONLY): 25 min   Charges:   PT Evaluation $Initial PT Evaluation Tier I: 1 Procedure PT Treatments $Therapeutic Activity: 8-22 mins   PT G CodesMelford Aase 10/11/2014, 10:22 AM Elwyn Reach, [place]

## 2014-10-12 ENCOUNTER — Encounter: Payer: Self-pay | Admitting: Internal Medicine

## 2014-10-12 DIAGNOSIS — R571 Hypovolemic shock: Secondary | ICD-10-CM

## 2014-10-12 DIAGNOSIS — K922 Gastrointestinal hemorrhage, unspecified: Secondary | ICD-10-CM

## 2014-10-12 DIAGNOSIS — C189 Malignant neoplasm of colon, unspecified: Secondary | ICD-10-CM

## 2014-10-12 DIAGNOSIS — I5043 Acute on chronic combined systolic (congestive) and diastolic (congestive) heart failure: Principal | ICD-10-CM

## 2014-10-12 DIAGNOSIS — N184 Chronic kidney disease, stage 4 (severe): Secondary | ICD-10-CM

## 2014-10-12 DIAGNOSIS — N179 Acute kidney failure, unspecified: Secondary | ICD-10-CM

## 2014-10-12 LAB — PHOSPHORUS: Phosphorus: 4.8 mg/dL — ABNORMAL HIGH (ref 2.3–4.6)

## 2014-10-12 LAB — IRON AND TIBC
IRON: 64 ug/dL (ref 42–165)
Saturation Ratios: 30 % (ref 20–55)
TIBC: 216 ug/dL (ref 215–435)
UIBC: 152 ug/dL (ref 125–400)

## 2014-10-12 LAB — GLUCOSE, CAPILLARY
GLUCOSE-CAPILLARY: 85 mg/dL (ref 70–99)
GLUCOSE-CAPILLARY: 164 mg/dL — AB (ref 70–99)
Glucose-Capillary: 100 mg/dL — ABNORMAL HIGH (ref 70–99)
Glucose-Capillary: 187 mg/dL — ABNORMAL HIGH (ref 70–99)
Glucose-Capillary: 184 mg/dL — ABNORMAL HIGH (ref 70–99)

## 2014-10-12 LAB — BASIC METABOLIC PANEL
ANION GAP: 10 (ref 5–15)
BUN: 50 mg/dL — ABNORMAL HIGH (ref 6–23)
CO2: 29 mmol/L (ref 19–32)
CREATININE: 2.67 mg/dL — AB (ref 0.50–1.35)
Calcium: 8.4 mg/dL (ref 8.4–10.5)
Chloride: 94 mmol/L — ABNORMAL LOW (ref 96–112)
GFR calc Af Amer: 24 mL/min — ABNORMAL LOW (ref >90)
GFR calc non Af Amer: 21 mL/min — ABNORMAL LOW (ref >90)
Glucose, Bld: 85 mg/dL (ref 70–99)
Potassium: 3.4 mmol/L — ABNORMAL LOW (ref 3.5–5.1)
Sodium: 133 mmol/L — ABNORMAL LOW (ref 135–145)

## 2014-10-12 LAB — CBC
HCT: 23.9 % — ABNORMAL LOW (ref 39.0–52.0)
HEMATOCRIT: 21.3 % — AB (ref 39.0–52.0)
HEMOGLOBIN: 6.6 g/dL — AB (ref 13.0–17.0)
Hemoglobin: 7.4 g/dL — ABNORMAL LOW (ref 13.0–17.0)
MCH: 28.7 pg (ref 26.0–34.0)
MCH: 28.1 pg (ref 26.0–34.0)
MCHC: 31 g/dL (ref 30.0–36.0)
MCHC: 31 g/dL (ref 30.0–36.0)
MCV: 92.6 fL (ref 78.0–100.0)
MCV: 90.9 fL (ref 78.0–100.0)
Platelets: 204 10*3/uL (ref 150–400)
Platelets: 235 10*3/uL (ref 150–400)
RBC: 2.3 MIL/uL — AB (ref 4.22–5.81)
RBC: 2.63 MIL/uL — ABNORMAL LOW (ref 4.22–5.81)
RDW: 18.6 % — ABNORMAL HIGH (ref 11.5–15.5)
RDW: 18.2 % — ABNORMAL HIGH (ref 11.5–15.5)
WBC: 5.2 10*3/uL (ref 4.0–10.5)
WBC: 5.3 10*3/uL (ref 4.0–10.5)

## 2014-10-12 LAB — FERRITIN: Ferritin: 1494 ng/mL — ABNORMAL HIGH (ref 22–322)

## 2014-10-12 LAB — MAGNESIUM: Magnesium: 2 mg/dL (ref 1.5–2.5)

## 2014-10-12 LAB — PREPARE RBC (CROSSMATCH)

## 2014-10-12 MED ORDER — INSULIN ASPART 100 UNIT/ML ~~LOC~~ SOLN
0.0000 [IU] | Freq: Three times a day (TID) | SUBCUTANEOUS | Status: DC
  Administered 2014-10-12 (×2): 3 [IU] via SUBCUTANEOUS
  Administered 2014-10-13 (×2): 2 [IU] via SUBCUTANEOUS
  Administered 2014-10-14: 3 [IU] via SUBCUTANEOUS
  Administered 2014-10-15: 2 [IU] via SUBCUTANEOUS
  Administered 2014-10-15: 3 [IU] via SUBCUTANEOUS

## 2014-10-12 MED ORDER — INSULIN ASPART 100 UNIT/ML ~~LOC~~ SOLN: 0.0000 [IU] | Freq: Every day | SUBCUTANEOUS | Status: DC

## 2014-10-12 MED ORDER — POTASSIUM CHLORIDE 10 MEQ/50ML IV SOLN
10.0000 meq | INTRAVENOUS | Status: AC
  Administered 2014-10-12 (×4): 10 meq via INTRAVENOUS
  Filled 2014-10-12 (×4): qty 50

## 2014-10-12 MED ORDER — FENTANYL CITRATE 0.05 MG/ML IJ SOLN: 25.0000 ug | INTRAMUSCULAR | Status: DC | PRN

## 2014-10-12 MED ORDER — SODIUM CHLORIDE 0.9 % IV SOLN: Freq: Once | INTRAVENOUS | Status: DC

## 2014-10-12 MED ORDER — AMIODARONE HCL 200 MG PO TABS
200.0000 mg | ORAL_TABLET | Freq: Two times a day (BID) | ORAL | Status: DC
  Administered 2014-10-12 – 2014-10-16 (×9): 200 mg via ORAL
  Filled 2014-10-12 (×13): qty 1

## 2014-10-12 MED ORDER — SODIUM CHLORIDE 0.9 % IV BOLUS (SEPSIS)
500.0000 mL | Freq: Once | INTRAVENOUS | Status: AC
  Administered 2014-10-12: 500 mL via INTRAVENOUS

## 2014-10-12 NOTE — Progress Notes (Signed)
[place] Progress Note Patient Name: Greg [name] DOB: 09/29/1931 MRN: [id number]   Date of Service  10/12/2014  HPI/Events of Note  Goal MAP of 55 current is 27.  Patient is making urine.  Off pressors.  CVP 2.  eICU Interventions  Plan: 500cc bolus NS for BP support     Intervention Category Intermediate Interventions: Hypotension - evaluation and management  Nicole Hafley 10/12/2014, 12:25 AM

## 2014-10-12 NOTE — Progress Notes (Signed)
PULMONARY / CRITICAL CARE MEDICINE   Name: Greg [name] MRN: [id number] DOB: [birth year]    ADMISSION DATE:  09/22/2014 CONSULTATION DATE:  10/08/2014  REFERRING MD :  TRH  CHIEF COMPLAINT:  Post op respiratory failure  BRIEF DESCRIPTION:  81 yo male admitted to [facility] combined heart failure, A flutter, and anemia.  He was found to have colon mass and transferred to [facility] for surgical evaluation.  STUDIES: 2/04 Echo >> EF 20 to 25% 2/04 Doppler Rt arm >> DVT in axillary and brachial veins 2/15 EGD >> stricture at GE junction, small HH, multiple small ulcers in gastric antrum/bulb and 2nd part of duodenum 2/15 colonoscopy >> Rt colon mass 2/16 CT abd/pelvis >> decreased attenuation Lt liver, thickening proximal ascending colon, b/l pleural effusions with compressive ATX  SIGNIFICANT EVENTS: 2/15 EGD, colonoscopy 2/16 Surgery consult 2/17 Transfer to [facility] 2/19 Laparotomy >> partial colectomy with ileostomy and liver bx 2/21 extubated to BiPAP 2/22 DC BiPAP 2/23 Transfuse 1 unit PRBC for Hb 6.6  SUBJECTIVE: Denies chest pain, dyspnea.  Tolerated diet last night.  Denies nausea.  Abdominal discomfort improving.  VITAL SIGNS: Temp:  [97.6 F (36.4 C)-98.5 F (36.9 C)] 97.6 F (36.4 C) (02/23 0700) Pulse Rate:  [31-123] 36 (02/23 0700) Resp:  [8-27] 15 (02/23 0700) BP: (83-133)/(30-65) 117/38 mmHg (02/23 0700) SpO2:  [92 %-100 %] 97 % (02/23 0700) FiO2 (%):  [40 %] 40 % (02/22 0833) Weight:  [205 lb 11 oz (93.3 kg)] 205 lb 11 oz (93.3 kg) (02/23 0500) HEMODYNAMICS: CVP:  [2 mmHg-90 mmHg] 69 mmHg VENTILATOR SETTINGS: Vent Mode:  [-] PCV;CPAP FiO2 (%):  [40 %] 40 % Set Rate:  [8 bmp] 8 bmp PEEP:  [6 cmH20] 6 cmH20 INTAKE / OUTPUT:  Intake/Output Summary (Last 24 hours) at 10/12/14 0752 Last data filed at 10/12/14 7001  Gross per 24 hour  Intake 1986.1 ml  Output   2395 ml  Net -408.9 ml    PHYSICAL EXAMINATION: General: no distress Neuro: normal strength HEENT: no  sinus tenderness Cardiovascular: irregular Lungs: no wheeze Abdomen: soft, ileostomy in RLQ Musculoskeletal: minimal ankle edema Skin: no rashes  LABS: PULMONARY  Recent Labs Lab 10/08/14 1143 10/08/14 1350 10/09/14 0535  PHART 7.566* 7.467* 7.455*  PCO2ART 45.2* 43.2 42.9  PO2ART 375.0* 98.0 160.0*  HCO3 41.2* 31.4* 30.1*  TCO2 43 33 31  O2SAT 100.0 98.0 100.0   CBC  Recent Labs Lab 10/10/14 0415 10/11/14 0400 10/12/14 0346  HGB 8.0* 7.0* 6.6*  HCT 25.8* 22.1* 21.3*  WBC 7.4 6.3 5.2  PLT 280 235 204   CHEMISTRY  Recent Labs Lab 10/08/14 1337 10/09/14 0403 10/10/14 0415 10/11/14 0400 10/12/14 0346  NA 137 133* 132* 131* 133*  K 4.0 3.9 4.0 3.6 3.4*  CL 95* 95* 93* 94* 94*  CO2 30 32 29 29 29   GLUCOSE 187* 213* 151* 115* 85  BUN 68* 62* 56* 50* 50*  CREATININE 2.62* 2.49* 2.56* 2.44* 2.67*  CALCIUM 9.2 8.5 8.5 8.2* 8.4  MG  --  1.8 1.9 2.0 2.0  PHOS  --  4.5 5.3* 5.0* 4.8*   Estimated Creatinine Clearance: 25.7 mL/min (by C-G formula based on Cr of 2.67).  LIVER  Recent Labs Lab 10/10/14 0415  AST 21  ALT 26  ALKPHOS 56  BILITOT 0.8  PROT 5.1*  ALBUMIN 2.5*   INFECTIOUS  Recent Labs Lab 10/09/14 0930 10/10/14 0415 10/11/14 0340  LATICACIDVEN  --  1.0  --   PROCALCITON  0.44 0.45 0.44   ENDOCRINE CBG (last 3)   Recent Labs  10/11/14 1913 10/11/14 2318 10/12/14 0346  GLUCAP 211* 75 85    IMAGING x48h No results found.   ASSESSMENT / PLAN:  PULMONARY OETT 2/19>>>2/21 A:  Acute respiratory failure 2nd to pulmonary edema/pleural effusions  >> improved. Vent dependence after surgery >> resolved. P:   Oxygen to keep SpO2 > 92% F/u CXR intermittently  CARDIOVASCULAR Lt IJ CVL 2/20 >> A:  Hypovolemic shock. Acute on chronic systolic/diastolic heart failure. A fib/flutter with hx of LBBB. RUE DVT dx 09/23/14. P:  Wean off levophed to keep SBP > 90, MAP > 60 Milrinone per cardiology ?transition amiodarone to po >>  defer to cardiology Hold lasix, demadex for now >> goal even fluid balance Continue lovenox  RENAL A:   Stage 3 CKD with baseline Cr of 2.3.  Hypokalemia.  P:   Monitor renal fx, urine outpt F/u and replace electrolytes as needed  GASTROINTESTINAL A:   Pathologic stage T4a/N2b/M1 colon cancer s/p colectomy with ileostomy. PUD. Protein calorie malnutrition. P:   Post-op care per [facility] Continue protonix  HEMATOLOGIC A:   Anemia of critical illness and GI bleeding. P:  F/u CBC Transfuse for Hb < 7 Check iron levels  INFECTIOUS A:   No evidence infection. P:   Monitor clinically  ENDOCRINE A:   Hyperglycemia. Hx of hypothyroidism. P:   SSI Continue synthroid  NEUROLOGIC A:   Acute metabolic encephalopathy >> resolved. P:   Monitor clinically  CC time 40 minutes.  Chesley Mires, MD [facility] Pulmonary/Critical Care 10/12/2014, 7:52 AM Pager:  [phone] After 3pm call: [phone]

## 2014-10-12 NOTE — Progress Notes (Signed)
[place] Progress Note Patient Name: Greg [name] DOB: Dec 19, 1931 MRN: [id number]   Date of Service  10/12/2014  HPI/Events of Note  hypokalemia  eICU Interventions  Potassium replaced     Intervention Category Intermediate Interventions: Electrolyte abnormality - evaluation and management  DETERDING,ELIZABETH 10/12/2014, 5:58 AM

## 2014-10-12 NOTE — Progress Notes (Signed)
[facility] Progress Note 4 Days Post-Op  Subjective: Patient is doing better today.  Appetite is picking up.  No acute distress.  Objective: Vital signs in last 24 hours: Temp:  [97.6 F (36.4 C)-98.5 F (36.9 C)] 98 F (36.7 C) (02/23 0800) Pulse Rate:  [31-123] 86 (02/23 0800) Resp:  [8-27] 14 (02/23 0800) BP: (83-133)/(30-65) 131/36 mmHg (02/23 0800) SpO2:  [92 %-100 %] 100 % (02/23 0800) Weight:  [93.3 kg (205 lb 11 oz)] 93.3 kg (205 lb 11 oz) (02/23 0500) Last BM Date: 10/11/14  Intake/Output from previous day: 02/22 0701 - 02/23 0700 In: 2034.1 [P.O.:540; I.V.:944.1; IV Piggyback:550] Out: 2395 [Urine:1020; TKWIO:9735] Intake/Output this shift: Total I/O In: 422.8 [I.V.:42.8; Blood:330; IV Piggyback:50] Out: 350 [Urine:275; Stool:75]  General: No distress  Lungs: Clear.  Sats are 88-92% on RA  Abd: Soft, nontender.  Midline wound is clean and dry.  Extremities: No changes.  No clinical signs or symptoms of DVT  Neuro: Intact.  Lab Results:  @LABLAST2 (wbc:2,hgb:2,hct:2,plt:2) BMET  Recent Labs  10/11/14 0400 10/12/14 0346  NA 131* 133*  K 3.6 3.4*  CL 94* 94*  CO2 29 29  GLUCOSE 115* 85  BUN 50* 50*  CREATININE 2.44* 2.67*  CALCIUM 8.2* 8.4   PT/INR No results for input(s): LABPROT, INR in the last 72 hours. ABG No results for input(s): PHART, HCO3 in the last 72 hours.  Invalid input(s): PCO2, PO2  Studies/Results: No results found.  Anti-infectives: Anti-infectives    Start     Dose/Rate Route Frequency Ordered Stop   10/08/14 1100  ertapenem (INVANZ) 1 g in sodium chloride 0.9 % 50 mL IVPB     1 g 100 mL/hr over 30 Minutes Intravenous To Surgery 10/08/14 1053 10/08/14 1111   10/08/14 0600  cefoTEtan (CEFOTAN) 2 g in dextrose 5 % 50 mL IVPB     2 g 100 mL/hr over 30 Minutes Intravenous On call to O.R. 10/07/14 0806 10/09/14 0559      Assessment/Plan: s/p Procedure(s): EXPLORATOROY LAPAROTOMY PARTIAL COLECTOMY WITH ILEOSTOMY No  changes from surgical standpoint.  Doing well.  Can transition to PO cardiac meds.  LOS: 20 days   Kathryne Eriksson. Dahlia Bailiff, MD, FACS [phone] [facility] Surgery 10/12/2014

## 2014-10-12 NOTE — Progress Notes (Signed)
CRITICAL VALUE ALERT  Critical value received:  hgb 6.6  Date of notification:  10/12/2014  Time of notification:  0430  Critical value read back:Yes.    Nurse who received alert:  Paulla Fore  MD notified (1st page):  Deterding  Time of first page:  [phone]  MD notified (2nd page):  Time of second page:  Responding MD:  Deterding  Time MD responded:  [phone]

## 2014-10-12 NOTE — Progress Notes (Signed)
Patient Name: Greg [name] Date of Encounter: 10/12/2014     Principal Problem:   Colonic mass Active Problems:   Acute combined systolic and diastolic congestive heart failure   Anemia due to GI blood loss   Shock   Anemia of chronic disease   Acute respiratory failure with hypoxia   Atrial fibrillation/ atrilal flutter- new   Type 2 diabetes mellitus with renal manifestations   Protein-calorie malnutrition, severe   Acute renal failure superimposed on stage 4 chronic kidney disease   Abnormal EKG- known IVCD (LBBB type)   Fall   Elevated troponin   Hyperkalemia   Elevated LFTs   PUD (peptic ulcer disease)    SUBJECTIVE  81 y/o male with h/o LBBB, type 2 diabetes mellitus, obesity, stage III renal disease, and cardiomyopathy with prior LVEF of 40-45%. Admitted to [facility] 09/22/14 for acute hypoxic respiratory failure 2/2 acute on chronic systolic + diastolic CHF. Repeat 2D echo demonstrated severely reduced EF of 20-25%. Also with new diagnosis of atrial flutter and RUE DVT - placed on IV heparin. Also w/ severe anemia. Required multiple transfusions. FOBT positive. Colonoscopy revealed a circumferential firm mass with friable surfaces a the hepatic flexure. Transferred to [facility] 10/06/14 for possible surgery. Day 4 s/p exploratory laparotomy with partial colectomy with ileostomy. Post-operative course complicated by acute on chronic CHF.   He remains on IV amio and milrinone.  Levophed weaned off this AM.  Pressures soft but stable.  Slept well last night.  No dyspnea, chest, or abdominal pain.  CURRENT MEDS . sodium chloride   Intravenous Once  . antiseptic oral rinse  7 mL Mouth Rinse QID  . enoxaparin (LOVENOX) injection  95 mg Subcutaneous Q24H  . insulin aspart  0-15 Units Subcutaneous TID WC  . insulin aspart  0-5 Units Subcutaneous QHS  . levothyroxine  125 mcg Oral QAC breakfast  . mupirocin cream   Topical BID  . pantoprazole  40 mg Oral Q1200  . polyethylene glycol  17 g  Oral Daily  . sodium chloride  2 spray Each Nare QID    OBJECTIVE  Filed Vitals:   10/12/14 0900 10/12/14 1000 10/12/14 1100 10/12/14 1200  BP: 115/50 97/42 113/43 93/46  Pulse: 111 83 102 60  Temp:   98.1 F (36.7 C)   TempSrc:   Oral   Resp: 14 15 14 14   Height:      Weight:      SpO2: 97% 94% 98% 96%    Intake/Output Summary (Last 24 hours) at 10/12/14 1242 Last data filed at 10/12/14 1200  Gross per 24 hour  Intake 2681.38 ml  Output   3030 ml  Net -348.62 ml   Filed Weights   10/10/14 0600 10/11/14 0421 10/12/14 0500  Weight: 209 lb 7 oz (95 kg) 205 lb 4 oz (93.1 kg) 205 lb 11 oz (93.3 kg)    PHYSICAL EXAM  General: Pleasant, NAD. Neuro: Alert and oriented X 3. Moves all extremities spontaneously. Psych: Normal affect. HEENT:  Normal  Neck: Supple without bruits or JVD. Lungs:  Resp regular and unlabored, CTA. Heart: ir, ir, no s3, s4, or murmurs. Abdomen: Soft, non-tender, non-distended, BS + x 4.  Extremities: No clubbing, cyanosis or edema. DP/PT/Radials 2+ and equal bilaterally.  Accessory Clinical Findings  CBC  Recent Labs  10/12/14 0346 10/12/14 0950  WBC 5.2 5.3  HGB 6.6* 7.4*  HCT 21.3* 23.9*  MCV 92.6 90.9  PLT 204 335   Basic Metabolic  Panel  Recent Labs  10/11/14 0400 10/12/14 0346  NA 131* 133*  K 3.6 3.4*  CL 94* 94*  CO2 29 29  GLUCOSE 115* 85  BUN 50* 50*  CREATININE 2.44* 2.67*  CALCIUM 8.2* 8.4  MG 2.0 2.0  PHOS 5.0* 4.8*   Liver Function Tests  Recent Labs  10/10/14 0415  AST 21  ALT 26  ALKPHOS 56  BILITOT 0.8  PROT 5.1*  ALBUMIN 2.5*   TELE  Afib, 70's to 80's occas pvc's.  Radiology/Studies  Dg Chest Port 1 View  10/10/2014   CLINICAL DATA:  ET tube placement  EXAM: PORTABLE CHEST - 1 VIEW  COMPARISON:  10/09/2014  FINDINGS: The endotracheal tube is just below the level of the clavicular heads. The nasogastric tube extends into the stomach. There is a left jugular central line with tip in the upper  SVC. No pneumothorax. New there is a small right pleural effusion. There are mild basilar opacities bilaterally without significant interval change.  IMPRESSION: Persistent right effusion and mild bibasilar airspace opacities without significant interval change.   Electronically Signed   By: Andreas Newport M.D.   On: 10/10/2014 06:36   ASSESSMENT AND PLAN  1.  Acute on chronic systolic CHF:  EF 06-30% by echo this admission.  Breathing much improved.  On room air.  Euvolemic on exam.  Minus 25.8 L for admission.  Wt down to 205 from 243 lbs on admission.  BUN/Creat/Bicarb stable.  Diuretics on hold.  No bb in setting of relative hypotn and low output req milrinone.  No acei/arb/spiro/arni in setting of acute on chronic renal failure.  Can hopefully begin to wean milrinone in next 24 hrs.  2.  Afib/flutter:  Persistent.  Rate controlled. Cont amio but switch to 200 mg po bid today.  On lovenox for now.  H/H remain low but stable.  CHA2DS2VASc = 5.  Will need longterm oral anticoag provided that anemia subsides and no further GIB.  3.  RUE DVT:  On therapeutic dosing of lovenox (renally adjusted).  As above, will need transition to [facility] at some point.  Cont to follow H/H.   4.  Acute on chronic stage III-IV renal failure:  BUN/Creat relatively stable.  Diuretics on hold in setting of euvolemia.  5.  Colonic Mass: s/p ex lap with partial colecomty and ileostomy.  Mgmt per surgery.  6.  ABL Anemia: stable.  Follow.  Signed, Murray Hodgkins NP  I have personally seen and examined this patient with Ignacia Bayley, NP. I agree with the assessment and plan as outlined above. Atrial fib is rate controlled. Would switch amiodarone to po today. Lasix on hold with euvolemia. Goal to wean milrinone to off over next 24 hours.   Paxson Harrower 10/12/2014 1:26 PM

## 2014-10-12 NOTE — Progress Notes (Signed)
[place] Progress Note Patient Name: Greg [name] DOB: 1932-05-03 MRN: [id number]   Date of Service  10/12/2014  HPI/Events of Note  Patient with drop in Hgb to 6.6 from 7.0  eICU Interventions  Plan:  Transfuse 1 unit pRBC Post-transfusion CBC     Intervention Category Intermediate Interventions: Bleeding - evaluation and treatment with blood products  Damilola Flamm 10/12/2014, 4:33 AM

## 2014-10-13 LAB — CBC
HEMATOCRIT: 25.1 % — AB (ref 39.0–52.0)
Hemoglobin: 7.8 g/dL — ABNORMAL LOW (ref 13.0–17.0)
MCH: 28.4 pg (ref 26.0–34.0)
MCHC: 31.1 g/dL (ref 30.0–36.0)
MCV: 91.3 fL (ref 78.0–100.0)
PLATELETS: 257 10*3/uL (ref 150–400)
RBC: 2.75 MIL/uL — ABNORMAL LOW (ref 4.22–5.81)
RDW: 18.5 % — ABNORMAL HIGH (ref 11.5–15.5)
WBC: 5.3 10*3/uL (ref 4.0–10.5)

## 2014-10-13 LAB — BASIC METABOLIC PANEL
Anion gap: 12 (ref 5–15)
BUN: 48 mg/dL — AB (ref 6–23)
CO2: 24 mmol/L (ref 19–32)
CREATININE: 2.88 mg/dL — AB (ref 0.50–1.35)
Calcium: 8.6 mg/dL (ref 8.4–10.5)
Chloride: 97 mmol/L (ref 96–112)
GFR calc Af Amer: 22 mL/min — ABNORMAL LOW (ref >90)
GFR, EST NON AFRICAN AMERICAN: 19 mL/min — AB (ref >90)
GLUCOSE: 142 mg/dL — AB (ref 70–99)
POTASSIUM: 4 mmol/L (ref 3.5–5.1)
SODIUM: 133 mmol/L — AB (ref 135–145)

## 2014-10-13 LAB — GLUCOSE, CAPILLARY
GLUCOSE-CAPILLARY: 117 mg/dL — AB (ref 70–99)
Glucose-Capillary: 123 mg/dL — ABNORMAL HIGH (ref 70–99)
Glucose-Capillary: 150 mg/dL — ABNORMAL HIGH (ref 70–99)
Glucose-Capillary: 122 mg/dL — ABNORMAL HIGH (ref 70–99)

## 2014-10-13 LAB — TYPE AND SCREEN
ABO/RH(D): A POS
Antibody Screen: NEGATIVE
UNIT DIVISION: 0

## 2014-10-13 MED ORDER — ACETAMINOPHEN 325 MG PO TABS: 325.0000 mg | ORAL_TABLET | Freq: Four times a day (QID) | ORAL | Status: DC | PRN

## 2014-10-13 NOTE — Progress Notes (Signed)
ANTICOAGULATION CONSULT NOTE - Follow Up Consult  Pharmacy Consult for Lovenox Indication: atrial fibrillation and DVT in RUE  Allergies  Allergen Reactions  . Celery Oil     vomiting    Patient Measurements: Height: 5\' 11"  (180.3 cm) Weight: 206 lb 2.1 oz (93.5 kg) IBW/kg (Calculated) : 75.3  Vital Signs: Temp: 98.2 F (36.8 C) (02/24 0721) Temp Source: Oral (02/24 0721) BP: 128/59 mmHg (02/24 0700) Pulse Rate: 91 (02/24 0700)  Labs:  Recent Labs  10/11/14 0400 10/12/14 0346 10/12/14 0950 10/13/14 0400  HGB 7.0* 6.6* 7.4* 7.8*  HCT 22.1* 21.3* 23.9* 25.1*  PLT 235 204 235 257  CREATININE 2.44* 2.67*  --  2.88*    Estimated Creatinine Clearance: 23.9 mL/min (by C-G formula based on Cr of 2.88).  Assessment: 23 yom admitted with new onset afib and transfusion dependent anemia, FOBT x3 as outpatient. Anticoagulation was initially held pending GI work-up. Venous doppler ultrasound on 09/23/14 was positive for right upper extremity axillary and brachial acute occlusive DVT. Patient was started on IV heparin then transitioned to lovenox on 2/21 to minimize fluid intake. He has tolerated the lovenox fairly well - required PRBCs on 2/23 but Hgb improving now, platelets stable. No bleeding reported. Renal function worsening. Weight = 93.5kg.  Goal of Therapy:  Anti-Xa level 0.6-1 units/ml 4hrs after LMWH dose given Monitor platelets by anticoagulation protocol: Yes   Plan:  1) Continue lovenox 95mg  sq q24 (~1mg /kg q24) 2) Continue to follow CBC, renal function, plan for oral AC  Deboraha Sprang 10/13/2014,8:40 AM

## 2014-10-13 NOTE — Progress Notes (Signed)
Patient ID: Greg [name], male   DOB: 1932-03-18, 81 y.o.   MRN: [id number]     [address] [place], [place] [address]    Phone: [phone] FAX: [phone]     Subjective: Pt feeling good.  Ileostomy output has increased, on miralax.  Tolerating POs.    Objective:  Vital signs:  Filed Vitals:   10/13/14 0600 10/13/14 0700 10/13/14 0721 10/13/14 0800  BP: 122/47 128/59  107/56  Pulse: 92 91  94  Temp:   98.2 F (36.8 C)   TempSrc:   Oral   Resp: _0 Height:      Weight:      SpO2: 92% 98%  97%    Last BM Date: 10/12/14  Intake/Output   Yesterday:  02/23 0701 - 02/24 0700 In: 1450.5 [P.O.:365; I.V.:605.5; Blood:330; IV Piggyback:150] Out: 3400 [Urine:1400; Stool:2000] This shift: I/O last 3 completed shifts: In: 2495.8 [P.O.:365; I.V.:1100.8; Blood:330; IV KZSWFUXNA:355] Out: 7322 [GURKY:7062; BJSEG:3151]     Physical Exam: General: Pt awake/alert/oriented x4 in no acute distress Abdomen: Soft.  Nondistended.  Midline wound--staples in place, no surrounding erythema, edges are approximated.  No evidence of peritonitis.  No incarcerated hernias.   Problem List:   Principal Problem:   Colonic mass Active Problems:   Anemia of chronic disease   Abnormal EKG- known IVCD (LBBB type)   Fall   Acute respiratory failure with hypoxia   Atrial fibrillation/ atrilal flutter- new   Elevated troponin   Hyperkalemia   Elevated LFTs   Acute combined systolic and diastolic congestive heart failure   Type 2 diabetes mellitus with renal manifestations   Anemia due to GI blood loss   Protein-calorie malnutrition, severe   PUD (peptic ulcer disease)   Shock   Acute renal failure superimposed on stage 4 chronic kidney disease    Results:   Labs: Results for orders placed or performed during the hospital encounter of 09/22/14 (from the past 48 hour(s))  Glucose, capillary     Status:  Abnormal   Collection Time: 10/11/14 12:43 PM  Result Value Ref Range   Glucose-Capillary 154 (H) 70 - 99 mg/dL  Glucose, capillary     Status: Abnormal   Collection Time: 10/11/14  3:24 PM  Result Value Ref Range   Glucose-Capillary 207 (H) 70 - 99 mg/dL   Comment 1 Notify RN   Occult blood card to lab, stool RN will collect     Status: None   Collection Time: 10/11/14  5:45 PM  Result Value Ref Range   Fecal Occult Bld NEGATIVE NEGATIVE  Glucose, capillary     Status: Abnormal   Collection Time: 10/11/14  7:13 PM  Result Value Ref Range   Glucose-Capillary 211 (H) 70 - 99 mg/dL   Comment 1 Capillary Specimen    Comment 2 Notify RN    Comment 3 Document in Chart   Glucose, capillary     Status: None   Collection Time: 10/11/14 11:18 PM  Result Value Ref Range   Glucose-Capillary 75 70 - 99 mg/dL   Comment 1 Capillary Specimen    Comment 2 Notify RN    Comment 3 Document in Chart   CBC     Status: Abnormal   Collection Time: 10/12/14  3:46 AM  Result Value Ref Range   WBC 5.2 4.0 - 10.5 K/uL   RBC 2.30 (L) 4.22 - 5.81  MIL/uL   Hemoglobin 6.6 (LL) 13.0 - 17.0 g/dL    Comment: REPEATED TO VERIFY CRITICAL RESULT CALLED TO, READ BACK BY AND VERIFIED WITH: MUELLER R,RN 10/12/14 0429 WAYK    HCT 21.3 (L) 39.0 - 52.0 %   MCV 92.6 78.0 - 100.0 fL   MCH 28.7 26.0 - 34.0 pg   MCHC 31.0 30.0 - 36.0 g/dL   RDW 18.6 (H) 11.5 - 15.5 %   Platelets 204 150 - 400 K/uL  Basic metabolic panel     Status: Abnormal   Collection Time: 10/12/14  3:46 AM  Result Value Ref Range   Sodium 133 (L) 135 - 145 mmol/L   Potassium 3.4 (L) 3.5 - 5.1 mmol/L   Chloride 94 (L) 96 - 112 mmol/L   CO2 29 19 - 32 mmol/L   Glucose, Bld 85 70 - 99 mg/dL   BUN 50 (H) 6 - 23 mg/dL   Creatinine, Ser 2.67 (H) 0.50 - 1.35 mg/dL   Calcium 8.4 8.4 - 10.5 mg/dL   GFR calc non Af Amer 21 (L) >90 mL/min   GFR calc Af Amer 24 (L) >90 mL/min    Comment: (NOTE) The eGFR has been calculated using the CKD EPI  equation. This calculation has not been validated in all clinical situations. eGFR's persistently <90 mL/min signify possible Chronic Kidney Disease.    Anion gap 10 5 - 15  Phosphorus     Status: Abnormal   Collection Time: 10/12/14  3:46 AM  Result Value Ref Range   Phosphorus 4.8 (H) 2.3 - 4.6 mg/dL  Magnesium     Status: None   Collection Time: 10/12/14  3:46 AM  Result Value Ref Range   Magnesium 2.0 1.5 - 2.5 mg/dL  Glucose, capillary     Status: None   Collection Time: 10/12/14  3:46 AM  Result Value Ref Range   Glucose-Capillary 85 70 - 99 mg/dL   Comment 1 Venous Specimen   Prepare RBC     Status: None   Collection Time: 10/12/14  5:00 AM  Result Value Ref Range   Order Confirmation ORDER PROCESSED BY BLOOD BANK   Type and screen     Status: None   Collection Time: 10/12/14  5:00 AM  Result Value Ref Range   ABO/RH(D) A POS    Antibody Screen NEG    Sample Expiration 10/15/2014    Unit Number [id number]    Blood Component Type RED CELLS,LR    Unit division 00    Status of Unit ISSUED,FINAL    Transfusion Status OK TO TRANSFUSE    Crossmatch Result Compatible   Glucose, capillary     Status: Abnormal   Collection Time: 10/12/14  7:50 AM  Result Value Ref Range   Glucose-Capillary 100 (H) 70 - 99 mg/dL   Comment 1 Capillary Specimen   Iron and TIBC     Status: None   Collection Time: 10/12/14  9:50 AM  Result Value Ref Range   Iron 64 42 - 165 ug/dL   TIBC 216 215 - 435 ug/dL   Saturation Ratios 30 20 - 55 %   UIBC 152 125 - 400 ug/dL    Comment: Performed at [facility]  Ferritin     Status: Abnormal   Collection Time: 10/12/14  9:50 AM  Result Value Ref Range   Ferritin 1494 (H) 22 - 322 ng/mL    Comment: Performed at [facility]  Status: Abnormal   Collection Time: 10/12/14  9:50 AM  Result Value Ref Range   WBC 5.3 4.0 - 10.5 K/uL   RBC 2.63 (L) 4.22 - 5.81 MIL/uL   Hemoglobin 7.4 (L) 13.0 - 17.0 g/dL   HCT 23.9 (L)  39.0 - 52.0 %   MCV 90.9 78.0 - 100.0 fL   MCH 28.1 26.0 - 34.0 pg   MCHC 31.0 30.0 - 36.0 g/dL   RDW 18.2 (H) 11.5 - 15.5 %   Platelets 235 150 - 400 K/uL  Glucose, capillary     Status: Abnormal   Collection Time: 10/12/14 11:05 AM  Result Value Ref Range   Glucose-Capillary 187 (H) 70 - 99 mg/dL   Comment 1 Notify RN   Glucose, capillary     Status: Abnormal   Collection Time: 10/12/14  3:00 PM  Result Value Ref Range   Glucose-Capillary 184 (H) 70 - 99 mg/dL  Glucose, capillary     Status: Abnormal   Collection Time: 10/12/14  8:46 PM  Result Value Ref Range   Glucose-Capillary 164 (H) 70 - 99 mg/dL   Comment 1 Capillary Specimen   Basic metabolic panel     Status: Abnormal   Collection Time: 10/13/14  4:00 AM  Result Value Ref Range   Sodium 133 (L) 135 - 145 mmol/L   Potassium 4.0 3.5 - 5.1 mmol/L   Chloride 97 96 - 112 mmol/L   CO2 24 19 - 32 mmol/L   Glucose, Bld 142 (H) 70 - 99 mg/dL   BUN 48 (H) 6 - 23 mg/dL   Creatinine, Ser 2.88 (H) 0.50 - 1.35 mg/dL   Calcium 8.6 8.4 - 10.5 mg/dL   GFR calc non Af Amer 19 (L) >90 mL/min   GFR calc Af Amer 22 (L) >90 mL/min    Comment: (NOTE) The eGFR has been calculated using the CKD EPI equation. This calculation has not been validated in all clinical situations. eGFR's persistently <90 mL/min signify possible Chronic Kidney Disease.    Anion gap 12 5 - 15  CBC     Status: Abnormal   Collection Time: 10/13/14  4:00 AM  Result Value Ref Range   WBC 5.3 4.0 - 10.5 K/uL   RBC 2.75 (L) 4.22 - 5.81 MIL/uL   Hemoglobin 7.8 (L) 13.0 - 17.0 g/dL   HCT 25.1 (L) 39.0 - 52.0 %   MCV 91.3 78.0 - 100.0 fL   MCH 28.4 26.0 - 34.0 pg   MCHC 31.1 30.0 - 36.0 g/dL   RDW 18.5 (H) 11.5 - 15.5 %   Platelets 257 150 - 400 K/uL  Glucose, capillary     Status: Abnormal   Collection Time: 10/13/14  7:19 AM  Result Value Ref Range   Glucose-Capillary 123 (H) 70 - 99 mg/dL   Comment 1 Capillary Specimen    Comment 2 Notify RN      Imaging / Studies: No results found.  Medications / Allergies:  Scheduled Meds: . sodium chloride   Intravenous Once  . amiodarone  200 mg Oral BID  . antiseptic oral rinse  7 mL Mouth Rinse QID  . enoxaparin (LOVENOX) injection  95 mg Subcutaneous Q24H  . insulin aspart  0-15 Units Subcutaneous TID WC  . insulin aspart  0-5 Units Subcutaneous QHS  . levothyroxine  125 mcg Oral QAC breakfast  . mupirocin cream   Topical BID  . pantoprazole  40 mg Oral Q1200  . sodium chloride  2  spray Each Nare QID   Continuous Infusions: . sodium chloride 10 mL/hr at 10/13/14 0600  . milrinone 0.375 mcg/kg/min (10/13/14 0824)  . norepinephrine (LEVOPHED) Adult infusion Stopped (10/12/14 1000)   PRN Meds:.sodium chloride, fentaNYL, metoprolol, sodium chloride  Antibiotics: Anti-infectives    Start     Dose/Rate Route Frequency Ordered Stop   10/08/14 1100  ertapenem (INVANZ) 1 g in sodium chloride 0.9 % 50 mL IVPB     1 g 100 mL/hr over 30 Minutes Intravenous To Surgery 10/08/14 1053 10/08/14 1111   10/08/14 0600  cefoTEtan (CEFOTAN) 2 g in dextrose 5 % 50 mL IVPB     2 g 100 mL/hr over 30 Minutes Intravenous On call to O.R. 10/07/14 0037 10/09/14 0559      Assessment/Plan right colon mass POD#5 exploratory laparotomy, partial colectomy with ileostomy, liver biopsy---Dr. B. Thompson Adenocarcinoma with metastasis  -i did not discuss pathology with the patient.  Will review with Dr. Hulen Skains, but pt will likely need OP oncology follow up for chemotherapy. -will stop his miralax -monitor ileostomy output(2L out) and signs of dehydration, encourage PO intake, IVF.  May need to add fiber if output remains high -mobilize -SCD/lovenox -will add PO pain meds PRN, but not using anything now.  -can transfer out from surgical standpoint   Erby Pian, [facility] Pager [phone](7A-4:30P) For consults and floor pages call [phone](7A-4:30P)  10/13/2014 9:23  AM

## 2014-10-13 NOTE — Progress Notes (Signed)
     SUBJECTIVE: Feeling well. Breathing is ok.   BP 107/56 mmHg  Pulse 94  Temp(Src) 98.2 F (36.8 C) (Oral)  Resp 16  Ht 5\' 11"  (1.803 m)  Wt 206 lb 2.1 oz (93.5 kg)  BMI 28.76 kg/m2  SpO2 97%  Intake/Output Summary (Last 24 hours) at 10/13/14 0846 Last data filed at 10/13/14 0600  Gross per 24 hour  Intake 1027.7 ml  Output   3050 ml  Net -2022.3 ml    PHYSICAL EXAM General: Well developed, well nourished, in no acute distress. Alert and oriented x 3.  Psych:  Good affect, responds appropriately Neck: No JVD. No masses noted.  Lungs: Clear bilaterally with no wheezes or rhonci noted.  Heart: Irreg irreg with no murmurs noted. Abdomen: Bowel sounds are present. Soft, non-tender.  Extremities: No lower extremity edema.   LABS: Basic Metabolic Panel:  Recent Labs  10/11/14 0400 10/12/14 0346 10/13/14 0400  NA 131* 133* 133*  K 3.6 3.4* 4.0  CL 94* 94* 97  CO2 29 29 24   GLUCOSE 115* 85 142*  BUN 50* 50* 48*  CREATININE 2.44* 2.67* 2.88*  CALCIUM 8.2* 8.4 8.6  MG 2.0 2.0  --   PHOS 5.0* 4.8*  --    CBC:  Recent Labs  10/12/14 0950 10/13/14 0400  WBC 5.3 5.3  HGB 7.4* 7.8*  HCT 23.9* 25.1*  MCV 90.9 91.3  PLT 235 257   Current Meds: . sodium chloride   Intravenous Once  . amiodarone  200 mg Oral BID  . antiseptic oral rinse  7 mL Mouth Rinse QID  . enoxaparin (LOVENOX) injection  95 mg Subcutaneous Q24H  . insulin aspart  0-15 Units Subcutaneous TID WC  . insulin aspart  0-5 Units Subcutaneous QHS  . levothyroxine  125 mcg Oral QAC breakfast  . mupirocin cream   Topical BID  . pantoprazole  40 mg Oral Q1200  . polyethylene glycol  17 g Oral Daily  . sodium chloride  2 spray Each Nare QID    ASSESSMENT AND PLAN:  1. Acute on chronic systolic CHF: EF 85-02% by echo this admission. He is euvolemic. CVP is 2. Breathing much improved. Minus 28 L for admission. Renal function slightly worsened. Diuretics on hold. No beta blocker in setting  of relative hypotension and low output requiring milrinone. No acei/arb/spiro/arni in setting of acute on chronic renal failure. Wean milrinone to off today.   2. Afib/flutter: Persistent. Rate controlled. Cont amiodarone 200 mg po BID. On lovenox for now. H/H remain low but stable. CHA2DS2VASc = 5. Will need longterm oral anticoagulation provided that anemia subsides and no further GIB.  3. RUE DVT: On therapeutic dosing of lovenox (renally adjusted). As above, will need transition to oral anti-coagulation at some point. Cont to follow H/H.   4. Acute on chronic stage III-IV renal failure: BUN/Creat relatively stable. Diuretics on hold in setting of euvolemia.  5. Colonic Mass: s/p ex lap with partial colectomy and ileostomy. Mgmt per surgery.  6. Acuet blood loss anemia: stable. Follow.   MCALHANY,CHRISTOPHER  2/24/20168:46 AM

## 2014-10-13 NOTE — Progress Notes (Signed)
Physical Therapy Treatment Patient Details Name: Greg [name] MRN: [id number] DOB: 13-Aug-1932 Today's Date: 10/13/2014    History of Present Illness Greg [name] is a 81 y.o. male, With history of type 2 diabetes mellitus, chronic kidney disease stage III,essential hypertension, sleep apnea, left bundle branch block, systolic heart failure EF now 20-25%, who was admitted to [facility] with  weakness and fall,he was found to have acute on chronic respiratory failure with acute on chronic systolic and diastolic CHF, new onset atrial fibrillation and anemia. , he was also seen by GI for anemia workup and colonoscopy relieved colonic mass. He was thought to be high risk for surgery at [facility] and transferred here for further care. He also developed acute renal failure, Developed R arm DVT there as well. Pt s/p total colectomy 2/19    PT Comments    Pt with improved sitting tolerance and ability to complete stand pvt to chair however remains severely deconditioned and con't to require maxA for all OOB mobility. Pt to con't to benefit from SNF upon d/c to maximize functional recovery.   Follow Up Recommendations  SNF;Supervision for mobility/OOB     Equipment Recommendations  None recommended by PT    Recommendations for Other Services       Precautions / Restrictions Precautions Precautions: Fall Precaution Comments: pt with colostomy Restrictions Weight Bearing Restrictions: No    Mobility  Bed Mobility Overal bed mobility: Needs Assistance Bed Mobility: Rolling;Sidelying to Sit Rolling: Max assist Sidelying to sit: Max assist;+2 for physical assistance;HOB elevated       General bed mobility comments: max directional verbal and tactile cues for task  Transfers Overall transfer level: Needs assistance Equipment used: Rolling walker (2 wheeled) Transfers: Sit to/from [facility] Sit to Stand: Max assist;+2 physical assistance;From elevated surface Stand  pivot transfers: Max assist;+2 physical assistance       General transfer comment: pt stood multiple times with maxA x2 with gait belt and bed pad. unable to achieve full upright position depsite max v/c's and assist at knees and hips. pt stood x 3 and then completed stand pvt. pt did take 2 steps with max A to chair  Ambulation/Gait                 Stairs            Wheelchair Mobility    Modified Rankin (Stroke Patients Only)       Balance Overall balance assessment: Needs assistance Sitting-balance support: Feet supported Sitting balance-Leahy Scale: Fair Sitting balance - Comments: pt able to sit with min guard x 5 min however required min/modA when doing LE ther ex   Standing balance support: Bilateral upper extremity supported Standing balance-Leahy Scale: Zero                      Cognition Arousal/Alertness: Awake/alert Behavior During Therapy: WFL for tasks assessed/performed Overall Cognitive Status: Impaired/Different from baseline Area of Impairment: Problem solving;Safety/judgement;Awareness         Safety/Judgement: Decreased awareness of safety;Decreased awareness of deficits Awareness: Emergent Problem Solving: Slow processing;Decreased initiation;Difficulty sequencing;Requires tactile cues;Requires verbal cues      Exercises General Exercises - Lower Extremity Ankle Circles/Pumps: AROM;Both;10 reps;Supine Quad Sets: AROM;Both;10 reps;Supine Long Arc Quad: PROM;Both;10 reps;Seated    General Comments        Pertinent Vitals/Pain Pain Assessment: No/denies pain    Home Living  Prior Function            PT Goals (current goals can now be found in the care plan section) Acute Rehab PT Goals Patient Stated Goal: go home Progress towards PT goals: Progressing toward goals    Frequency  Min 3X/week    PT Plan Current plan remains appropriate    Co-evaluation             End of  Session Equipment Utilized During Treatment: Gait belt Activity Tolerance: Patient tolerated treatment well Patient left: in chair;with call bell/phone within reach;with family/visitor present (lift pad in chair)     Time: 1100-1134 PT Time Calculation (min) (ACUTE ONLY): 34 min  Charges:  $Therapeutic Exercise: 8-22 mins $Therapeutic Activity: 8-22 mins                    G Codes:      Kingsley Callander 10/13/2014, 12:29 PM   Kittie Plater, PT, DPT Pager #: [phone] Office #: [phone]

## 2014-10-13 NOTE — Progress Notes (Signed)
Report called to Leda Roys RN. Pt to transfer to 6N-32 via bed, meds in chart, belongings at bedside, family aware of room change. No questions or complaints at this time. Bedside handoff to Hassan Rowan, [place].

## 2014-10-13 NOTE — Progress Notes (Signed)
PULMONARY / CRITICAL CARE MEDICINE   Name: Greg [name] MRN: [id number] DOB: Nov 09, 1931    ADMISSION DATE:  09/22/2014 CONSULTATION DATE:  10/08/2014  REFERRING MD :  TRH  CHIEF COMPLAINT:  Post op respiratory failure  BRIEF DESCRIPTION:  81 yo male admitted to [facility] combined heart failure, A flutter, and anemia.  He was found to have colon mass and transferred to [facility] for surgical evaluation.  STUDIES: 2/04 Echo >> EF 20 to 25% 2/04 Doppler Rt arm >> DVT in axillary and brachial veins 2/15 EGD >> stricture at GE junction, small HH, multiple small ulcers in gastric antrum/bulb and 2nd part of duodenum 2/15 colonoscopy >> Rt colon mass 2/16 CT abd/pelvis >> decreased attenuation Lt liver, thickening proximal ascending colon, b/l pleural effusions with compressive ATX  SIGNIFICANT EVENTS: 2/15 EGD, colonoscopy 2/16 Surgery consult 2/17 Transfer to [facility] 2/19 Laparotomy >> partial colectomy with ileostomy and liver bx 2/21 extubated to BiPAP 2/22 DC BiPAP 2/23 Transfuse 1 unit PRBC for Hb 6.6, off pressors 2/24 d/c milrinone  SUBJECTIVE: Feels better.  Denies chest pain.  Breathing better.  VITAL SIGNS: Temp:  [97.7 F (36.5 C)-98.2 F (36.8 C)] 98.2 F (36.8 C) (02/24 0721) Pulse Rate:  [42-123] 83 (02/24 0900) Resp:  [9-23] 19 (02/24 0900) BP: (87-137)/(34-91) 130/54 mmHg (02/24 0900) SpO2:  [92 %-100 %] 96 % (02/24 0900) Weight:  [206 lb 2.1 oz (93.5 kg)] 206 lb 2.1 oz (93.5 kg) (02/24 0500) INTAKE / OUTPUT:  Intake/Output Summary (Last 24 hours) at 10/13/14 0931 Last data filed at 10/13/14 0600  Gross per 24 hour  Intake  837.2 ml  Output   3010 ml  Net -2172.8 ml    PHYSICAL EXAMINATION: General: no distress Neuro: normal strength HEENT: no sinus tenderness Cardiovascular: irregular Lungs: no wheeze Abdomen: soft, ileostomy in RLQ Musculoskeletal: minimal ankle edema Skin: no rashes  LABS: PULMONARY  Recent Labs Lab 10/08/14 1143 10/08/14 1350  10/09/14 0535  PHART 7.566* 7.467* 7.455*  PCO2ART 45.2* 43.2 42.9  PO2ART 375.0* 98.0 160.0*  HCO3 41.2* 31.4* 30.1*  TCO2 43 33 31  O2SAT 100.0 98.0 100.0   CBC  Recent Labs Lab 10/12/14 0346 10/12/14 0950 10/13/14 0400  HGB 6.6* 7.4* 7.8*  HCT 21.3* 23.9* 25.1*  WBC 5.2 5.3 5.3  PLT 204 235 257   CHEMISTRY  Recent Labs Lab 10/09/14 0403 10/10/14 0415 10/11/14 0400 10/12/14 0346 10/13/14 0400  NA 133* 132* 131* 133* 133*  K 3.9 4.0 3.6 3.4* 4.0  CL 95* 93* 94* 94* 97  CO2 32 29 29 29 24   GLUCOSE 213* 151* 115* 85 142*  BUN 62* 56* 50* 50* 48*  CREATININE 2.49* 2.56* 2.44* 2.67* 2.88*  CALCIUM 8.5 8.5 8.2* 8.4 8.6  MG 1.8 1.9 2.0 2.0  --   PHOS 4.5 5.3* 5.0* 4.8*  --    Estimated Creatinine Clearance: 23.9 mL/min (by C-G formula based on Cr of 2.88).  LIVER  Recent Labs Lab 10/10/14 0415  AST 21  ALT 26  ALKPHOS 56  BILITOT 0.8  PROT 5.1*  ALBUMIN 2.5*   INFECTIOUS  Recent Labs Lab 10/09/14 0930 10/10/14 0415 10/11/14 0340  LATICACIDVEN  --  1.0  --   PROCALCITON 0.44 0.45 0.44   ENDOCRINE CBG (last 3)   Recent Labs  10/12/14 1500 10/12/14 2046 10/13/14 0719  GLUCAP 184* 164* 123*    IMAGING x48h No results found.   ASSESSMENT / PLAN:  PULMONARY OETT 2/19>>>2/21 A:  Acute  respiratory failure 2nd to pulmonary edema/pleural effusions  >> improved. Vent dependence after surgery >> resolved. P:   Oxygen to keep SpO2 > 92% F/u CXR intermittently  CARDIOVASCULAR Lt IJ CVL 2/20 >> A:  Hypovolemic shock >> resolved. Acute on chronic systolic/diastolic heart failure. A fib/flutter with hx of LBBB. RUE DVT dx 09/23/14. P:  D/c milrinone 2/24 per cardiology Amiodarone per cardiology Hold lasix, demadex for now >> goal even fluid balance for now Continue lovenox   RENAL A:   Stage 3 CKD with baseline Cr of 2.3.  Hypokalemia.  P:   Monitor renal fx, urine outpt F/u and replace electrolytes as needed D/c  foley  GASTROINTESTINAL A:   Pathologic stage T4a/N2b/M1 colon cancer s/p colectomy with ileostomy. PUD. Protein calorie malnutrition. P:   Post-op care per [facility] Continue protonix  HEMATOLOGIC A:   Anemia of critical illness. P:  F/u CBC Transfuse for Hb < 7  INFECTIOUS A:   No evidence infection. P:   Monitor clinically  ENDOCRINE A:   Hyperglycemia. Hx of hypothyroidism. P:   SSI Continue synthroid  NEUROLOGIC A:   Acute metabolic encephalopathy >> resolved. Deconditioning. P:   Consult physical therapy  Will transfer to telemetry once he is off milrinone >> can then d/c CVL afterward.  Will ask [facility] to assume care from 2/25 and PCCM sign off.  Chesley Mires, MD [facility] Pulmonary/Critical Care 10/13/2014, 9:31 AM Pager:  [phone] After 3pm call: [phone]

## 2014-10-14 DIAGNOSIS — N184 Chronic kidney disease, stage 4 (severe): Secondary | ICD-10-CM

## 2014-10-14 DIAGNOSIS — I482 Chronic atrial fibrillation: Secondary | ICD-10-CM

## 2014-10-14 LAB — GLUCOSE, CAPILLARY
GLUCOSE-CAPILLARY: 180 mg/dL — AB (ref 70–99)
Glucose-Capillary: 106 mg/dL — ABNORMAL HIGH (ref 70–99)
Glucose-Capillary: 107 mg/dL — ABNORMAL HIGH (ref 70–99)
Glucose-Capillary: 194 mg/dL — ABNORMAL HIGH (ref 70–99)

## 2014-10-14 LAB — CBC
HCT: 28.9 % — ABNORMAL LOW (ref 39.0–52.0)
Hemoglobin: 9.1 g/dL — ABNORMAL LOW (ref 13.0–17.0)
MCH: 29.4 pg (ref 26.0–34.0)
MCHC: 31.5 g/dL (ref 30.0–36.0)
MCV: 93.2 fL (ref 78.0–100.0)
PLATELETS: 261 10*3/uL (ref 150–400)
RBC: 3.1 MIL/uL — AB (ref 4.22–5.81)
RDW: 18.5 % — ABNORMAL HIGH (ref 11.5–15.5)
WBC: 9.5 10*3/uL (ref 4.0–10.5)

## 2014-10-14 LAB — BASIC METABOLIC PANEL
Anion gap: 10 (ref 5–15)
BUN: 50 mg/dL — ABNORMAL HIGH (ref 6–23)
CHLORIDE: 100 mmol/L (ref 96–112)
CO2: 21 mmol/L (ref 19–32)
Calcium: 8.5 mg/dL (ref 8.4–10.5)
Creatinine, Ser: 3.06 mg/dL — ABNORMAL HIGH (ref 0.50–1.35)
GFR, EST AFRICAN AMERICAN: 21 mL/min — AB (ref >90)
GFR, EST NON AFRICAN AMERICAN: 18 mL/min — AB (ref >90)
Glucose, Bld: 108 mg/dL — ABNORMAL HIGH (ref 70–99)
Potassium: 4.3 mmol/L (ref 3.5–5.1)
Sodium: 131 mmol/L — ABNORMAL LOW (ref 135–145)

## 2014-10-14 MED ORDER — SODIUM CHLORIDE 0.9 % IJ SOLN
10.0000 mL | INTRAMUSCULAR | Status: DC | PRN
  Administered 2014-10-14 (×3): 10 mL

## 2014-10-14 MED ORDER — OFF THE BEAT BOOK
Freq: Once | Status: DC
  Filled 2014-10-14: qty 1

## 2014-10-14 MED ORDER — LOPERAMIDE HCL 2 MG PO CAPS
2.0000 mg | ORAL_CAPSULE | Freq: Two times a day (BID) | ORAL | Status: DC
  Administered 2014-10-14 – 2014-10-16 (×5): 2 mg via ORAL
  Filled 2014-10-14 (×5): qty 1

## 2014-10-14 NOTE — Progress Notes (Addendum)
Patient ID: Greg [name], male   DOB: September 24, 1931, 81 y.o.   MRN: [id number] [facility] PROGRESS NOTE  Greg [name] CVE:[id number] DOB: 1932-03-02 DOA: 09/22/2014 PCP: Greg Bogus, MD  Brief narrative:    81 year old male with past medical history of diabetes mellitus type 2, chronic kidney disease stage III with baseline creatinine of 2.3, hypertension, transfusion dependent anemia (was supposed to see GI just prior to the admission for transfusion), sleep apnea, systolic heart failure with ejection fraction in 20-25%. [facility] was [facility] to [facility] because of weakness and fall. He was found to have acute on chronic respiratory failure because of acute on chronic systolic and diastolic CHF. He had new onset atrial fibrillation. Patient was seen by cardiology and has required dobutamine drip along with diuretics. In addition, while at [facility] patient was found to have right upper extremity DVT and has required heparin drip at that time. Further, renal was consulted for acute renal failure.  As noted above, patient has history of transfusion dependent anemia. GI was consulted for input on management. Patient is status post endoscopy and colonoscopy in 10/04/2014. 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 Colonoscopy revealed right colon mass. Surgery was consulted 10/05/2014 after colonoscopy revealed right colon mass. Patient underwent exploratory laparotomy with partial colectomy with ileostomy and liver biopsy all done 10/08/2014. He was intubated for the surgery and the decision was made to keep patient intubated postoperatively. Patient was extubated 10/10/2014 and has required BiPAP for additional 24 hours. His ICU course was complicated with need for pressor support but off pressors from 10/12/2014. Patient was under critical care medicine team and transferred to [facility] on 10/14/2014    SIGNIFICANT  EVENTS: 2/15 EGD, colonoscopy 2/16 Surgery consult 2/17 Transfer to [facility] 2/19 Laparotomy >> partial colectomy with ileostomy and liver bx 2/21 extubated to BiPAP 2/22 DC BiPAP 2/23 Transfuse 1 unit PRBC for Hb 6.6, off pressors 2/24 d/c milrinone  Assessment/Plan:    Principal problem: Acute respiratory failure with hypoxia / ventilatory dependent respiratory failure secondary to pulmonary edema / pleural effusions  - Patient has history of chronic respiratory failure in addition to congestive heart failure, obstructive sleep apnea. He required surgery for removal of the right colon mass and was intubated postoperatively 10/08/2014. Patient was successfully extubated on 10/10/2014 at which time patient require BiPAP for 24 more hours. BiPAP finally stopped 10/11/2014.  - Respiratory status remains stable. Oxygen saturation is 98% on ambient air.  - Patient was transferred out of stepdown unit to medical floor on 10/13/2014.   Active problems:  Acute on chronic combined systolic and diastolic heart failure / hypovolemic shock - Complicated hospital course that required multidisciplinary team involvement from critical care to cardiology, GI, surgery. - Cardiology was consulted for management of combined systolic and diastolic heart failure. Patient required initially dobutamine drip, Lasix, milrinone.  - As noted above patient required pressor support. Pressors stopped 10/12/2014. Milrinone stopped 10/13/2014.  - Beta blocker has been on hold due to hypotension  New onset atrial fibrillation / atrial flutter / history of left bundle branch block - Appreciate cardiology recommendations. - CHADSVASC - 6.  - Rate controlled with amiodarone 200 mg twice daily - Continue anticoagulation with Lovenox 95 mg daily subcutaneous  Right upper extremity DVT - Found on upper extremity Doppler 09/23/2014. He was initially on heparin drip. - Patient currently on Lovenox 95 mg subcutaneous  daily  Acute on chronic  disease, chronic kidney disease stage IV - Baseline creatinine around 2.3. Admission creatinine above baseline values. Patient did receive Lasix on the admission. Creatinine function is worse this morning, 3.06 - Nephrology was consulted initially for input on acute kidney injury. We will see how kidney function is in next 24 hours. We may need to reconsult nephrology.  Right colon mass / colon cancer stage T4a/N2b/M1 - Status post right colectomy, ileostomy 10/08/2014. - Appreciate surgery following  Transfusion dependent anemia, anemia of chronic disease - Likely in the setting of colon cancer.  - Hemoglobin is stable at 9.1.   Severe protein calorie malnutrition - In the context of acute illness. - Diet as tolerated.  Acute metabolic encephalopathy >> resolved. - Likely due to deconditioning, complex medical issues - Resolved  - Continue with PT   DVT Prophylaxis  -  on therapeutic anticoagulation with Lovenox    Code Status: Full.  Family Communication:  Family not at the bedside  [facility] Plan: to [facility] in next 24-48 hours   IV access:  CVC  Procedures and diagnostic studies:    2/04 Echo >> EF 20 to 25% 2/04 Doppler Rt arm >> DVT in axillary and brachial veins 2/15 EGD >> stricture at GE junction, small HH, multiple small ulcers in gastric antrum/bulb and 2nd part of duodenum 2/15 colonoscopy >> Rt colon mass 2/16 CT abd/pelvis >> decreased attenuation Lt liver, thickening proximal ascending colon, b/l pleural effusions with compressive ATX  Medical Consultants:  Cardiology Surgery Nephrology GI  Other Consultants:  Nutrition Physical therapy  WOC  IAnti-Infectives:   None    Greg Lenz, MD  [facility] Pager [phone]  If 7PM-7AM, please contact night-coverage [email or web address] Password [facility] 10/14/2014, 10:42 AM   LOS: 22 days    HPI/Subjective: No acute overnight events.  Objective: Filed Vitals:    10/13/14 1758 10/13/14 2100 10/14/14 0500 10/14/14 0530  BP: 134/59 129/58  121/52  Pulse: 85 92  83  Temp: 99 F (37.2 C) 99.7 F (37.6 C)  99.1 F (37.3 C)  TempSrc: Oral Oral  Oral  Resp: 19 17  16   Height: 5\' 11"  (1.803 m)     Weight: 96.163 kg (212 lb)  94.983 kg (209 lb 6.4 oz)   SpO2: 98% 97%  98%    Intake/Output Summary (Last 24 hours) at 10/14/14 1042 Last data filed at 10/14/14 1010  Gross per 24 hour  Intake    550 ml  Output   1115 ml  Net   -565 ml    Exam:   General:  Pt is  not in acute distress  Cardiovascular: Regular rate and rhythm, S1/S2 (+)  Respiratory: no wheezing, no crackles, no rhonchi  Abdomen: Soft, non tender, non distended, bowel sounds present; (+) ileostomy  Extremities: pulses DP and PT palpable bilaterally  Neuro: Grossly nonfocal  Data Reviewed: Basic Metabolic Panel:  Recent Labs Lab 10/09/14 0403 10/10/14 0415 10/11/14 0400 10/12/14 0346 10/13/14 0400 10/14/14 0436  NA 133* 132* 131* 133* 133* 131*  K 3.9 4.0 3.6 3.4* 4.0 4.3  CL 95* 93* 94* 94* 97 100  CO2 32 29 29 29 24 21   GLUCOSE 213* 151* 115* 85 142* 108*  BUN 62* 56* 50* 50* 48* 50*  CREATININE 2.49* 2.56* 2.44* 2.67* 2.88* 3.06*  CALCIUM 8.5 8.5 8.2* 8.4 8.6 8.5  MG 1.8 1.9 2.0 2.0  --   --   PHOS 4.5 5.3* 5.0* 4.8*  --   --  Liver Function Tests:  Recent Labs Lab 10/10/14 0415  AST 21  ALT 26  ALKPHOS 56  BILITOT 0.8  PROT 5.1*  ALBUMIN 2.5*   No results for input(s): LIPASE, AMYLASE in the last 168 hours. No results for input(s): AMMONIA in the last 168 hours. CBC:  Recent Labs Lab 10/11/14 0400 10/12/14 0346 10/12/14 0950 10/13/14 0400 10/14/14 0436  WBC 6.3 5.2 5.3 5.3 9.5  HGB 7.0* 6.6* 7.4* 7.8* 9.1*  HCT 22.1* 21.3* 23.9* 25.1* 28.9*  MCV 92.1 92.6 90.9 91.3 93.2  PLT 235 204 235 257 261   Cardiac Enzymes: No results for input(s): CKTOTAL, CKMB, CKMBINDEX, TROPONINI in the last 168 hours. BNP: Invalid input(s):  POCBNP CBG:  Recent Labs Lab 10/13/14 0719 10/13/14 1101 10/13/14 1644 10/13/14 2150 10/14/14 0734  GLUCAP 123* 150* 117* 122* 106*    Recent Results (from the past 240 hour(s))  Surgical pcr screen     Status: None   Collection Time: 10/08/14  8:31 AM  Result Value Ref Range Status   MRSA, PCR NEGATIVE NEGATIVE Final   Staphylococcus aureus NEGATIVE NEGATIVE Final     Scheduled Meds: . amiodarone  200 mg Oral BID  . enoxaparin (LOVENOX) injection  95 mg Subcutaneous Q24H  . insulin aspart  0-15 Units Subcutaneous TID WC  . insulin aspart  0-5 Units Subcutaneous QHS  . levothyroxine  125 mcg Oral QAC breakfast  . loperamide  2 mg Oral BID  . mupirocin cream   Topical BID  . pantoprazole  40 mg Oral Q1200  . sodium chloride  2 spray Each Nare QID   Continuous Infusions: . sodium chloride 10 mL/hr at 10/13/14 0600  . milrinone Stopped (10/13/14 1000)

## 2014-10-14 NOTE — Discharge Instructions (Signed)
Stroke Prevention Some medical conditions and behaviors are associated with an increased chance of having a stroke. You may prevent a stroke by making healthy choices and managing medical conditions. HOW CAN I REDUCE MY RISK OF HAVING A STROKE?   Stay physically active. Get at least 30 minutes of activity on most or all days.  Do not smoke. It may also be helpful to avoid exposure to secondhand smoke.  Limit alcohol use. Moderate alcohol use is considered to be:  No more than 2 drinks per day for men.  No more than 1 drink per day for nonpregnant women.  Eat healthy foods. This involves:  Eating 5 or more servings of fruits and vegetables a day.  Making dietary changes that address high blood pressure (hypertension), high cholesterol, diabetes, or obesity.  Manage your cholesterol levels.  Making food choices that are high in fiber and low in saturated fat, trans fat, and cholesterol may control cholesterol levels.  Take any prescribed medicines to control cholesterol as directed by your health care provider.  Manage your diabetes.  Controlling your carbohydrate and sugar intake is recommended to manage diabetes.  Take any prescribed medicines to control diabetes as directed by your health care provider.  Control your hypertension.  Making food choices that are low in salt (sodium), saturated fat, trans fat, and cholesterol is recommended to manage hypertension.  Take any prescribed medicines to control hypertension as directed by your health care provider.  Maintain a healthy weight.  Reducing calorie intake and making food choices that are low in sodium, saturated fat, trans fat, and cholesterol are recommended to manage weight.  Stop drug abuse.  Avoid taking birth control pills.  Talk to your health care provider about the risks of taking birth control pills if you are over 7 years old, smoke, get migraines, or have ever had a blood clot.  Get evaluated for sleep  disorders (sleep apnea).  Talk to your health care provider about getting a sleep evaluation if you snore a lot or have excessive sleepiness.  Take medicines only as directed by your health care provider.  For some people, aspirin or blood thinners (anticoagulants) are helpful in reducing the risk of forming abnormal blood clots that can lead to stroke. If you have the irregular heart rhythm of atrial fibrillation, you should be on a blood thinner unless there is a good reason you cannot take them.  Understand all your medicine instructions.  Make sure that other conditions (such as anemia or atherosclerosis) are addressed. SEEK IMMEDIATE MEDICAL CARE IF:   You have sudden weakness or numbness of the face, arm, or leg, especially on one side of the body.  Your face or eyelid droops to one side.  You have sudden confusion.  You have trouble speaking (aphasia) or understanding.  You have sudden trouble seeing in one or both eyes.  You have sudden trouble walking.  You have dizziness.  You have a loss of balance or coordination.  You have a sudden, severe headache with no known cause.  You have new chest pain or an irregular heartbeat. Any of these symptoms may represent a serious problem that is an emergency. Do not wait to see if the symptoms will go away. Get medical help at once. Call your local emergency services (911 in [place] Do not drive yourself to the hospital. Document Released: 09/13/2004 Document Revised: 12/21/2013 Document Reviewed: 02/06/2013 [facility] Patient Information 2015 [place], [place]. This information is not intended to replace advice given  to you by your health care provider. Make sure you discuss any questions you have with your health care provider. Atrial Fibrillation Atrial fibrillation is a type of irregular heart rhythm (arrhythmia). During atrial fibrillation, the upper chambers of the heart (atria) quiver continuously in a chaotic pattern. This causes  an irregular and often rapid heart rate.  Atrial fibrillation is the result of the heart becoming overloaded with disorganized signals that tell it to beat. These signals are normally released one at a time by a part of the right atrium called the sinoatrial node. They then travel from the atria to the lower chambers of the heart (ventricles), causing the atria and ventricles to contract and pump blood as they pass. In atrial fibrillation, parts of the atria outside of the sinoatrial node also release these signals. This results in two problems. First, the atria receive so many signals that they do not have time to fully contract. Second, the ventricles, which can only receive one signal at a time, beat irregularly and out of rhythm with the atria.  There are three types of atrial fibrillation:   Paroxysmal. Paroxysmal atrial fibrillation starts suddenly and stops on its own within a week.  Persistent. Persistent atrial fibrillation lasts for more than a week. It may stop on its own or with treatment.  Permanent. Permanent atrial fibrillation does not go away. Episodes of atrial fibrillation may lead to permanent atrial fibrillation. Atrial fibrillation can prevent your heart from pumping blood normally. It increases your risk of stroke and can lead to heart failure.  CAUSES   Heart conditions, including a heart attack, heart failure, coronary artery disease, and heart valve conditions.   Inflammation of the sac that surrounds the heart (pericarditis).  Blockage of an artery in the lungs (pulmonary embolism).  Pneumonia or other infections.  Chronic lung disease.  Thyroid problems, especially if the thyroid is overactive (hyperthyroidism).  Caffeine, excessive alcohol use, and use of some illegal drugs.   Use of some medicines, including certain decongestants and diet pills.  Heart surgery.   Birth defects.  Sometimes, no cause can be found. When this happens, the atrial  fibrillation is called lone atrial fibrillation. The risk of complications from atrial fibrillation increases if you have lone atrial fibrillation and you are age 55 years or older. RISK FACTORS  Heart failure.  Coronary artery disease.  Diabetes mellitus.   High blood pressure (hypertension).   Obesity.   Other arrhythmias.   Increased age. SIGNS AND SYMPTOMS   A feeling that your heart is beating rapidly or irregularly.   A feeling of discomfort or pain in your chest.   Shortness of breath.   Sudden light-headedness or weakness.   Getting tired easily when exercising.   Urinating more often than normal (mainly when atrial fibrillation first begins).  In paroxysmal atrial fibrillation, symptoms may start and suddenly stop. DIAGNOSIS  Your health care provider may be able to detect atrial fibrillation when taking your pulse. Your health care provider may have you take a test called an ambulatory electrocardiogram (ECG). An ECG records your heartbeat patterns over a 24-hour period. You may also have other tests, such as:  Transthoracic echocardiogram (TTE). During echocardiography, sound waves are used to evaluate how blood flows through your heart.  Transesophageal echocardiogram (TEE).  Stress test. There is more than one type of stress test. If a stress test is needed, ask your health care provider about which type is best for you.  Chest X-ray exam.  Blood tests.  Computed tomography (CT). TREATMENT  Treatment may include:  Treating any underlying conditions. For example, if you have an overactive thyroid, treating the condition may correct atrial fibrillation.  Taking medicine. Medicines may be given to control a rapid heart rate or to prevent blood clots, heart failure, or a stroke.  Having a procedure to correct the rhythm of the heart:  Electrical cardioversion. During electrical cardioversion, a controlled, low-energy shock is delivered to the  heart through your skin. If you have chest pain, very low blood pressure, or sudden heart failure, this procedure may need to be done as an emergency.  Catheter ablation. During this procedure, heart tissues that send the signals that cause atrial fibrillation are destroyed.  Surgical ablation. During this surgery, thin lines of heart tissue that carry the abnormal signals are destroyed. This procedure can either be an open-heart surgery or a minimally invasive surgery. With the minimally invasive surgery, small cuts are made to access the heart instead of a large opening.  Pulmonary venous isolation. During this surgery, tissue around the veins that carry blood from the lungs (pulmonary veins) is destroyed. This tissue is thought to carry the abnormal signals. HOME CARE INSTRUCTIONS   Take medicines only as directed by your health care provider. Some medicines can make atrial fibrillation worse or recur.  If blood thinners were prescribed by your health care provider, take them exactly as directed. Too much blood-thinning medicine can cause bleeding. If you take too little, you will not have the needed protection against stroke and other problems.  Perform blood tests at home if directed by your health care provider. Perform blood tests exactly as directed.  Quit smoking if you smoke.  Do not drink alcohol.  Do not drink caffeinated beverages such as coffee, soda, and some teas. You may drink decaffeinated coffee, soda, or tea.   Maintain a healthy weight.Do not use diet pills unless your health care provider approves. They may make heart problems worse.   Follow diet instructions as directed by your health care provider.  Exercise regularly as directed by your health care provider.  Keep all follow-up visits as directed by your health care provider. This is important. PREVENTION  The following substances can cause atrial fibrillation to recur:   Caffeinated  beverages.  Alcohol.  Certain medicines, especially those used for breathing problems.  Certain herbs and herbal medicines, such as those containing ephedra or ginseng.  Illegal drugs, such as cocaine and amphetamines. Sometimes medicines are given to prevent atrial fibrillation from recurring. Proper treatment of any underlying condition is also important in helping prevent recurrence.  SEEK MEDICAL CARE IF:  You notice a change in the rate, rhythm, or strength of your heartbeat.  You suddenly begin urinating more frequently.  You tire more easily when exerting yourself or exercising. SEEK IMMEDIATE MEDICAL CARE IF:   You have chest pain, abdominal pain, sweating, or weakness.  You feel nauseous.  You have shortness of breath.  You suddenly have swollen feet and ankles.  You feel dizzy.  Your face or limbs feel numb or weak.  You have a change in your vision or speech. MAKE SURE YOU:   Understand these instructions.  Will watch your condition.  Will get help right away if you are not doing well or get worse. Document Released: 08/06/2005 Document Revised: 12/21/2013 Document Reviewed: 09/16/2012 [facility] Patient Information 2015 [place], [place]. This information is not intended to replace advice given to you by your health care  provider. Make sure you discuss any questions you have with your health care provider. ° °

## 2014-10-14 NOTE — Clinical Social Work Note (Addendum)
Clinical Social Work Department CLINICAL SOCIAL WORK PLACEMENT NOTE 10/14/2014  Patient:  Greg [name], Greg [name]  Account Number:  [id number] Admit date:  09/22/2014  Clinical Social Worker:  ERIC ANTERHAUS, LCSWA  Date/time:  10/13/2014 09:43 AM  Clinical Social Work is seeking post-discharge placement for this patient at the following level of care:   [place]   (*CSW will update this form in Epic as items are completed)   10/13/2014  Patient/family provided with [place] Department of Clinical Social Work's list of facilities offering this level of care within the geographic area requested by the patient (or if unable, by the patient's family).  10/13/2014  Patient/family informed of their freedom to choose among providers that offer the needed level of care, that participate in Medicare, Medicaid or managed care program needed by the patient, have an available bed and are willing to accept the patient.  10/13/2014  Patient/family informed of [facility]' ownership interest in [facility] as well as of the fact that they are under no obligation to receive care at this facility.  PASARR submitted to EDS on 10/13/2014 PASARR number received on 10/13/2014  FL2 transmitted to all facilities in geographic area requested by pt/family on  10/14/2014 FL2 transmitted to all facilities within larger geographic area on 10/14/2014  Patient informed that his/her managed care company has contracts with or will negotiate with  certain facilities, including the following:     Patient/family informed of bed offers received:   Patient chooses bed at  Physician recommends and patient chooses bed at    Patient to be transferred to  on  10/16/2014 Patient to be transferred to facility by [facility] [place], [place] Patient and family notified of transfer on 10/16/2014 Name of family member notified:  Patient's wife was notified as she was present at bedside.   The following  physician request were entered in Epic:  Physician please sign FL2  Additional Comments:  Jones Broom. [place], MSW, [place] 10/14/2014 9:46 AM

## 2014-10-14 NOTE — Clinical Social Work Note (Signed)
CSW gave report to unit CSW, this CSW will sign off.  Jones Broom. [place], MSW, [place] 10/14/2014 9:48 AM

## 2014-10-14 NOTE — Clinical Social Work Note (Signed)
CSW faxed out updated clinicals to facilities for SNF placement, patient's wife still would prefer [facility].  Jones Broom. [place], MSW, [place] 10/14/2014 8:33 AM

## 2014-10-14 NOTE — Progress Notes (Signed)
NUTRITION FOLLOW UP  Intervention:   -Continue with current nutrition plan of care  Nutrition Dx:   Inadequate oral intake related to altered GI function as evidenced by s/p exploatory laparotomy, partial colctomy with ileostomy; progressing  Goal:   Pt to meet >/= 90% of their estimated nutrition needs; goal met  Monitor:   PO intake, labs, weight changes, I/O's  Assessment:   81 y/o male with h/o LBBB, type 2 diabetes mellitus, obesity, stage III renal disease, and cardiomyopathy with prior LVEF of 40-45%. Admitted to [facility] 09/22/14 for acute hypoxic respiratory failure 2/2 acute on chronic systolic + diastolic CHF.  S/p partial colectomy with ileostomy for rt colonic mass and liver mass on 10/08/14. Per MD notes, Ileostomy output 1050 ml in the past 24 hours.  Pt extubated on 10/10/14. Nutritional needs re-estimated due to change in status.  Spoke with pt and pt wife at bedside. They report pt's appetite has improved greatly over the past few days. He reports he ate 100% of his breakfast this morning. Pt wife reports he was not eating much PTA, however, she was supplementing his diet with 2-3 bottles of Ensure daily. Offered to order supplements during hospitalization, however, they declined due to pt's eating as well as he has been. Educated pt and wife on importance of goof PO intake to promote healing.  Discharge disposition is for SNF once medically stable.  Labs reviewed. Na: 131, BUN/Creat: 50/3.06, Phos: 4.8, Glucose: 108, CBGS: 106-122.   Height: Ht Readings from Last 1 Encounters:  10/13/14 _0  (1.803 m)    Weight Status:   Wt Readings from Last 1 Encounters:  10/14/14 209 lb 6.4 oz (94.983 kg)   10/09/14 210 lb 8 oz (95.482 kg)       Re-estimated needs:  Kcal: 2000-2200 Protein: 100-110 grams Fluid: 2.0-2.2 L  Skin: stage II pressure ulcer on mid sacrum, closed abdominal incision, RLQ ileostomy  Diet Order: Diet Heart   Intake/Output Summary (Last 24 hours)  at 10/14/14 1022 Last data filed at 10/14/14 1010  Gross per 24 hour  Intake    550 ml  Output   1115 ml  Net   -565 ml    Last BM: 10/13/14   Labs:   Recent Labs Lab 10/10/14 0415 10/11/14 0400 10/12/14 0346 10/13/14 0400 10/14/14 0436  NA 132* 131* 133* 133* 131*  K 4.0 3.6 3.4* 4.0 4.3  CL 93* 94* 94* 97 100  CO2 _1 BUN 56* 50* 50* 48* 50*  CREATININE 2.56* 2.44* 2.67* 2.88* 3.06*  CALCIUM 8.5 8.2* 8.4 8.6 8.5  MG 1.9 2.0 2.0  --   --   PHOS 5.3* 5.0* 4.8*  --   --   GLUCOSE 151* 115* 85 142* 108*    CBG (last 3)   Recent Labs  10/13/14 1644 10/13/14 2150 10/14/14 0734  GLUCAP 117* 122* 106*    Scheduled Meds: . amiodarone  200 mg Oral BID  . enoxaparin (LOVENOX) injection  95 mg Subcutaneous Q24H  . insulin aspart  0-15 Units Subcutaneous TID WC  . insulin aspart  0-5 Units Subcutaneous QHS  . levothyroxine  125 mcg Oral QAC breakfast  . loperamide  2 mg Oral BID  . mupirocin cream   Topical BID  . pantoprazole  40 mg Oral Q1200  . sodium chloride  2 spray Each Nare QID    Continuous Infusions: . sodium chloride 10 mL/hr at 10/13/14 0600  . milrinone Stopped (  10/13/14 1000)    Skarlett Sedlacek A. Jimmye Norman, RD, LDN, CDE Pager: [phone] After hours Pager: [phone]

## 2014-10-14 NOTE — Progress Notes (Signed)
     SUBJECTIVE: No chest pain or SOB.   BP 121/52 mmHg  Pulse 83  Temp(Src) 99.1 F (37.3 C) (Oral)  Resp 16  Ht 5\' 11"  (1.803 m)  Wt 209 lb 6.4 oz (94.983 kg)  BMI 29.22 kg/m2  SpO2 98%  Intake/Output Summary (Last 24 hours) at 10/14/14 0743 Last data filed at 10/14/14 0600  Gross per 24 hour  Intake  604.7 ml  Output   1615 ml  Net -1010.3 ml    PHYSICAL EXAM General: Well developed, well nourished, in no acute distress. Alert and oriented x 3.  Psych:  Good affect, responds appropriately Neck: No JVD. No masses noted.  Lungs: Clear bilaterally with no wheezes or rhonci noted.  Heart: RRR with no murmurs noted. Abdomen: Bowel sounds are present. Soft, non-tender.  Extremities: No lower extremity edema.   LABS: Basic Metabolic Panel:  Recent Labs  10/12/14 0346 10/13/14 0400 10/14/14 0436  NA 133* 133* 131*  K 3.4* 4.0 4.3  CL 94* 97 100  CO2 29 24 21   GLUCOSE 85 142* 108*  BUN 50* 48* 50*  CREATININE 2.67* 2.88* 3.06*  CALCIUM 8.4 8.6 8.5  MG 2.0  --   --   PHOS 4.8*  --   --    CBC:  Recent Labs  10/13/14 0400 10/14/14 0436  WBC 5.3 9.5  HGB 7.8* 9.1*  HCT 25.1* 28.9*  MCV 91.3 93.2  PLT 257 261   Current Meds: . amiodarone  200 mg Oral BID  . enoxaparin (LOVENOX) injection  95 mg Subcutaneous Q24H  . insulin aspart  0-15 Units Subcutaneous TID WC  . insulin aspart  0-5 Units Subcutaneous QHS  . levothyroxine  125 mcg Oral QAC breakfast  . mupirocin cream   Topical BID  . pantoprazole  40 mg Oral Q1200  . sodium chloride  2 spray Each Nare QID   ASSESSMENT AND PLAN:  1. Acute on chronic systolic CHF: EF 10-25% by echo this admission. He is euvolemic. Diuretics held. Breathing much improved. Minus 28.7 L for admission. Renal function slightly worsened but overall stable. Would not resume diuretics at this time. Beta blocker has been on hold due to hypotension. No acei/arb/spiro/arni in setting of acute on chronic renal  failure.Milrinone is off.   2. Afib/flutter: Persistent. Rate controlled. Cont amiodarone 200 mg po BID. On lovenox for now. H/H remain low but stable. CHA2DS2VASc = 5. Will need longterm oral anticoagulation provided that anemia subsides and no further GIB.  3. RUE DVT: On therapeutic dosing of lovenox (renally adjusted). As above, will need transition to oral anti-coagulation at some point. Cont to follow H/H.   4. 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 Diuretics on hold in setting of euvolemia.  5. Colonic Mass: s/p ex lap with partial colectomy and ileostomy. Mgmt per surgery.  6. Acute blood loss anemia: stable. Follow.    Altovise Wahler  2/25/20167:43 AM

## 2014-10-14 NOTE — Progress Notes (Addendum)
6 Days Post-Op  Subjective: S/P R colectomy and liver BX POD#6. Doing a little better, got up and walked already this AM. Ileostomy output 1050/24h.  Objective: Vital signs in last 24 hours: Temp:  [98.4 F (36.9 C)-99.7 F (37.6 C)] 99.1 F (37.3 C) (02/25 0530) Pulse Rate:  [31-93] 83 (02/25 0530) Resp:  [16-25] 16 (02/25 0530) BP: (116-142)/(35-69) 121/52 mmHg (02/25 0530) SpO2:  [96 %-100 %] 98 % (02/25 0530) Weight:  [209 lb 6.4 oz (94.983 kg)-212 lb (96.163 kg)] 209 lb 6.4 oz (94.983 kg) (02/25 0500) Last BM Date: 10/13/14  Intake/Output from previous day: 02/24 0701 - 02/25 0700 In: 604.7 [P.O.:480; I.V.:124.7] Out: 1615 [Urine:565; Stool:1050] Intake/Output this shift:    General appearance: alert and cooperative GI: soft, NT, active BS, ileostomy pink with liquid stool output  Lab Results:   Recent Labs  10/13/14 0400 10/14/14 0436  WBC 5.3 9.5  HGB 7.8* 9.1*  HCT 25.1* 28.9*  PLT 257 261   BMET  Recent Labs  10/13/14 0400 10/14/14 0436  NA 133* 131*  K 4.0 4.3  CL 97 100  CO2 24 21  GLUCOSE 142* 108*  BUN 48* 50*  CREATININE 2.88* 3.06*  CALCIUM 8.6 8.5   PT/INR No results for input(s): LABPROT, INR in the last 72 hours. ABG No results for input(s): PHART, HCO3 in the last 72 hours.  Invalid input(s): PCO2, PO2  Studies/Results: No results found.  Anti-infectives: Anti-infectives    Start     Dose/Rate Route Frequency Ordered Stop   10/08/14 1100  ertapenem (INVANZ) 1 g in sodium chloride 0.9 % 50 mL IVPB     1 g 100 mL/hr over 30 Minutes Intravenous To Surgery 10/08/14 1053 10/08/14 1111   10/08/14 0600  cefoTEtan (CEFOTAN) 2 g in dextrose 5 % 50 mL IVPB     2 g 100 mL/hr over 30 Minutes Intravenous On call to O.R. 10/07/14 0806 10/09/14 0559      Assessment/Plan: S/P R colectomy and liver BX POD#6 Path - [id number], I D/W him and answered his questions. FEN - add low dose Imodium to decrease ileostomy output Deconditioning -  therapies and likely rehab, per primary Cardiomyopathy - per cards  LOS: 22 days    Greg [name] E 10/14/2014

## 2014-10-14 NOTE — Consult Note (Addendum)
[place] ostomy consult note Stoma type/location: Pt had ileostomy surgery performed on 2/19. Stoma intact to RLE. Stomal assessment/size: Stoma red and viable; slightly above skin level, 13/4 inches Peristomal assessment: Peristomal skin intact Output 50cc green liquid stool Ostomy pouching: 1pc. Education provided: Applied one piece pouch with barrier ring to maintain seal. Demonstrated pouch application to patient, no family members at bedside during teaching session. Pt does not participate or ask questions regarding ostomy care.  He will require total assistance with pouch application and emptying.  EMR indicates he plans to discharge to a SNF. Supplies left at bedside for staff nurse use.Educational materials left at bedside. Placed on [facility] discharge program. Julien Girt MSN, RN, [place], [place], [place]

## 2014-10-15 LAB — GLUCOSE, CAPILLARY
GLUCOSE-CAPILLARY: 132 mg/dL — AB (ref 70–99)
GLUCOSE-CAPILLARY: 112 mg/dL — AB (ref 70–99)
Glucose-Capillary: 187 mg/dL — ABNORMAL HIGH (ref 70–99)
Glucose-Capillary: 132 mg/dL — ABNORMAL HIGH (ref 70–99)

## 2014-10-15 MED ORDER — WARFARIN SODIUM 5 MG PO TABS: 2.5000 mg | ORAL_TABLET | Freq: Once | ORAL | Status: DC

## 2014-10-15 MED ORDER — WARFARIN - PHARMACIST DOSING INPATIENT: Freq: Every day | Status: DC

## 2014-10-15 NOTE — Progress Notes (Signed)
     SUBJECTIVE: No chest pain or SOB  BP 126/54 mmHg  Pulse 80  Temp(Src) 98.5 F (36.9 C) (Oral)  Resp 17  Ht 5\' 11"  (1.803 m)  Wt 201 lb 6.4 oz (91.354 kg)  BMI 28.10 kg/m2  SpO2 93%  Intake/Output Summary (Last 24 hours) at 10/15/14 0755 Last data filed at 10/15/14 7096  Gross per 24 hour  Intake    740 ml  Output   1450 ml  Net   -710 ml    PHYSICAL EXAM General: Well developed, well nourished, in no acute distress. Alert and oriented x 3.  Psych:  Good affect, responds appropriately Neck: No JVD. No masses noted.  Lungs: Clear bilaterally with no wheezes or rhonci noted.  Heart: irreg with no murmurs noted. Abdomen: Bowel sounds are present. Soft, non-tender.  Extremities: No lower extremity edema.   LABS: Basic Metabolic Panel:  Recent Labs  10/13/14 0400 10/14/14 0436  NA 133* 131*  K 4.0 4.3  CL 97 100  CO2 24 21  GLUCOSE 142* 108*  BUN 48* 50*  CREATININE 2.88* 3.06*  CALCIUM 8.6 8.5   CBC:  Recent Labs  10/13/14 0400 10/14/14 0436  WBC 5.3 9.5  HGB 7.8* 9.1*  HCT 25.1* 28.9*  MCV 91.3 93.2  PLT 257 261   Current Meds: . amiodarone  200 mg Oral BID  . enoxaparin (LOVENOX) injection  95 mg Subcutaneous Q24H  . insulin aspart  0-15 Units Subcutaneous TID WC  . insulin aspart  0-5 Units Subcutaneous QHS  . levothyroxine  125 mcg Oral QAC breakfast  . loperamide  2 mg Oral BID  . mupirocin cream   Topical BID  . off the beat book   Does not apply Once  . pantoprazole  40 mg Oral Q1200  . sodium chloride  2 spray Each Nare QID     ASSESSMENT AND PLAN:  1. Acute on chronic systolic CHF: EF 28-36% by echo this admission. He is euvolemic. Weight continues to go down. Diuretics held. Minus 28 L for admission. Renal function slightly worsened but overall stable. Would not resume diuretics at this time. Beta blocker has been on hold due to hypotension. No acei/arb/spiro/arni in setting of acute on chronic renal failure.  2.  Afib/flutter: Persistent. Rate controlled. Cont amiodarone 200 mg po BID. On lovenox for now. H/H remain low but stable. CHA2DS2VASc = 5. Will need longterm oral anticoagulation provided that anemia subsides and no further GIB.  3. RUE DVT: On therapeutic dosing of lovenox (renally adjusted). As above, will need transition to oral anti-coagulation at some point. Continue to follow H/H.   4. 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 Diuretics on hold in setting of euvolemia.  5. Colonic Mass: s/p ex lap with partial colectomy and ileostomy. Mgmt per surgery.  6. Acute blood loss anemia: stable. Follow.    Lilyan Prete  2/26/20167:55 AM

## 2014-10-15 NOTE — Progress Notes (Signed)
7 Days Post-Op  Subjective: S/P R colectomy, ileostomy, liver BX POD#7 Eating breakfast with his wife's help. Feeling somewhat better.  Objective: Vital signs in last 24 hours: Temp:  [98.5 F (36.9 C)-100.9 F (38.3 C)] 98.5 F (36.9 C) (02/26 0629) Pulse Rate:  [77-80] 80 (02/26 0629) Resp:  [16-17] 17 (02/26 0629) BP: (96-126)/(53-54) 126/54 mmHg (02/26 0629) SpO2:  [93 %] 93 % (02/26 0629) Weight:  [201 lb 6.4 oz (91.354 kg)] 201 lb 6.4 oz (91.354 kg) (02/26 0629) Last BM Date: 10/14/14  Intake/Output from previous day: 02/25 0701 - 02/26 0700 In: 740 [P.O.:740] Out: 1450 [Urine:500; Stool:950] Intake/Output this shift:    General appearance: alert and cooperative GI: incision CDI, soft, ileostomy pink with liquid output  Lab Results:   Recent Labs  10/13/14 0400 10/14/14 0436  WBC 5.3 9.5  HGB 7.8* 9.1*  HCT 25.1* 28.9*  PLT 257 261   BMET  Recent Labs  10/13/14 0400 10/14/14 0436  NA 133* 131*  K 4.0 4.3  CL 97 100  CO2 24 21  GLUCOSE 142* 108*  BUN 48* 50*  CREATININE 2.88* 3.06*  CALCIUM 8.6 8.5   PT/INR No results for input(s): LABPROT, INR in the last 72 hours. ABG No results for input(s): PHART, HCO3 in the last 72 hours.  Invalid input(s): PCO2, PO2  Studies/Results: No results found.  Anti-infectives: Anti-infectives    Start     Dose/Rate Route Frequency Ordered Stop   10/08/14 1100  ertapenem (INVANZ) 1 g in sodium chloride 0.9 % 50 mL IVPB     1 g 100 mL/hr over 30 Minutes Intravenous To Surgery 10/08/14 1053 10/08/14 1111   10/08/14 0600  cefoTEtan (CEFOTAN) 2 g in dextrose 5 % 50 mL IVPB     2 g 100 mL/hr over 30 Minutes Intravenous On call to O.R. 10/07/14 0806 10/09/14 0559      Assessment/Plan: S/P R colectomy, ileostomy, and liver BX POD#7 Path - [id number], I D/W his wife today. F/U with our office 3/4 for staple removal and 3/16 and I entered the times and dates for the F/U in the D/C navigator. FEN - added low dose  Imodium 2/25 and output is down some Deconditioning - therapies, SNF placement per primary service. OK to go to SNF today from our standpoint. Cardiomyopathy - per cards  LOS: 23 days    Mohan Erven E 10/15/2014

## 2014-10-15 NOTE — Progress Notes (Addendum)
Patient ID: Greg [name], male   DOB: 16-Aug-1932, 81 y.o.   MRN: [id number] [facility] PROGRESS NOTE  Greg [name] IWP:[id number] DOB: [birth year] DOA: 09/22/2014 PCP: [name], MD  Brief narrative:    81 year old male with past medical history of diabetes mellitus type 2, chronic kidney disease stage III with baseline creatinine of 2.3, hypertension, transfusion dependent anemia (was supposed to see GI just prior to the admission for transfusion), sleep apnea, systolic heart failure with ejection fraction in 20-25%. Patient was admitted to [facility] because of weakness and fall. He was found to have acute on chronic respiratory failure because of acute on chronic systolic and diastolic CHF. He had new onset atrial fibrillation. Patient was seen by cardiology and has required dobutamine drip along with diuretics. In addition, while at [facility] patient was found to have right upper extremity DVT and has required heparin drip at that time. Further, renal was consulted for acute renal failure.   As noted above, patient has history of transfusion dependent anemia. GI was consulted for input on management. Patient is status post endoscopy and colonoscopy in 10/04/2014. 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 Colonoscopy revealed right colon mass.  Surgery was consulted 10/05/2014 after colonoscopy revealed right colon mass. Patient underwent exploratory laparotomy with partial colectomy with ileostomy and liver biopsy all done 10/08/2014. He was intubated for the surgery and the decision was made to keep patient intubated postoperatively. Patient was extubated 10/10/2014 and has required BiPAP for additional 24 hours. His ICU course was complicated with need for pressor support but off pressors from 10/12/2014. Patient was under critical care medicine team and transferred to [facility] on 10/14/2014.   SIGNIFICANT  EVENTS: 2/15 EGD, colonoscopy 2/16 Surgery consult 2/17 Transfer to [facility] 2/19 Laparotomy >> partial colectomy with ileostomy and liver bx 2/21 extubated to BiPAP 2/22 DC BiPAP 2/23 Transfuse 1 unit PRBC for Hb 6.6, off pressors 2/24 D/C milrinone 2/26 Start coumadin   Assessment/Plan:    Principal problem: Acute respiratory failure with hypoxia / ventilatory dependent respiratory failure secondary to pulmonary edema / pleural effusions  - Patient has history of chronic respiratory failure in addition to congestive heart failure, obstructive sleep apnea.  - Patient required surgery for removal of the right colon mass and was intubated postoperatively 10/08/2014. Patient was successfully extubated on 10/10/2014 at which time patient require BiPAP for 24 more hours. BiPAP stopped 10/11/2014. Patient transferred out of stepdown unit 10/13/2014. - Patient continues to be stable respiratory status wise. Patient is saturating 93% on room air.  Active problems:  Acute on chronic combined systolic and diastolic heart failure / hypovolemic shock - Complicated hospital course that required multidisciplinary team involvement including but not limited to critical care and cardiology. - Cardiology was consulted for management of combined systolic and diastolic heart failure.  - Patient has required dobutamine drip, milrinone drip, Lasix. At this time all those aren't stopped as noted above under significant events. - Patient has required pressor support throughout the hospital stay. Pressors discontinued 10/12/2014. - Beta blocker remains on hold because of hypotension.  New onset atrial fibrillation / atrial flutter / history of left bundle branch block - CHADSVASC - 6.  - Appreciate cardiology continued to follow. - Rate controlled with amiodarone 200 mg twice daily - Continue anticoagulation with Lovenox 95 mg daily  Right upper extremity DVT - Found on upper extremity Doppler 09/23/2014. He  was initially  on heparin drip. - Patient currently on Lovenox 95 mg subcutaneous daily.  Acute on chronic disease, chronic kidney disease stage IV - Baseline creatinine around 2.3. Admission creatinine above baseline values. Patient did receive Lasix on the admission. Creatinine function is slightly worse, 3.06 compared to previous value of 2.88. Follow-up BMP this morning. - Nephrology was consulted initially for input on acute kidney injury. If kidney function worse we will have to get Nephrology input again.  Right colon mass / colon cancer stage T4a/N2b/M1 - Status post right colectomy, ileostomy 10/08/2014. - Per surgery, no further recommendations. Signed off 10/15/2014.  Transfusion dependent anemia, anemia of chronic disease - Likely in the setting of colon cancer.  - Hemoglobin is stable at 9.1.  - Follow-up CBC in the morning.  Severe protein calorie malnutrition - In the context of acute illness. - Diet as tolerated.  Acute metabolic encephalopathy  - Likely due to deconditioning, complex medical issues - Resolved  - Continue with PT - recommendation is for skilled nursing facility placement.  DVT Prophylaxis  - On therapeutic anticoagulation with Lovenox    Code Status: Full.  Family Communication:  Family not at the bedside  Disposition Plan: check BMP in am, if renal function stable may be ok for discharge to SNF   IV access:  CVC  Procedures and diagnostic studies:    2/04 Echo >> EF 20 to 25% 2/04 Doppler Rt arm >> DVT in axillary and brachial veins 2/15 EGD >> stricture at GE junction, small HH, multiple small ulcers in gastric antrum/bulb and 2nd part of duodenum 2/15 colonoscopy >> Rt colon mass 2/16 CT abd/pelvis >> decreased attenuation Lt liver, thickening proximal ascending colon, b/l pleural effusions with compressive ATX  Medical Consultants:  Cardiology Surgery Nephrology GI  Other Consultants:  Nutrition Physical therapy   WOC  IAnti-Infectives:   None     Greg Lenz, MD  [facility] Pager [phone]  If 7PM-7AM, please contact night-coverage [email or web address] Password [facility] 10/15/2014, 11:32 AM   LOS: 23 days    HPI/Subjective: No acute overnight events.  Objective: Filed Vitals:   10/14/14 0500 10/14/14 0530 10/14/14 2100 10/15/14 0629  BP:  121/52 96/53 126/54  Pulse:  83 77 80  Temp:  99.1 F (37.3 C) 100.9 F (38.3 C) 98.5 F (36.9 C)  TempSrc:  Oral Oral Oral  Resp:  16 16 17   Height:      Weight: 94.983 kg (209 lb 6.4 oz)   91.354 kg (201 lb 6.4 oz)  SpO2:  98%  93%    Intake/Output Summary (Last 24 hours) at 10/15/14 1132 Last data filed at 10/15/14 0657  Gross per 24 hour  Intake    500 ml  Output   1400 ml  Net   -900 ml    Exam:   General:  Pt is sleeping, no distress  Cardiovascular: S1, S2, RRR  Respiratory: bilateral  air entry, no wheezing   Abdomen: (+) ileostomy, non tender, non distended  Extremities: pulses palpable, no tenderness   Neuro: nonfocal  Data Reviewed: Basic Metabolic Panel:  Recent Labs Lab 10/09/14 0403 10/10/14 0415 10/11/14 0400 10/12/14 0346 10/13/14 0400 10/14/14 0436  NA 133* 132* 131* 133* 133* 131*  K 3.9 4.0 3.6 3.4* 4.0 4.3  CL 95* 93* 94* 94* 97 100  CO2 32 29 29 29 24 21   GLUCOSE 213* 151* 115* 85 142* 108*  BUN 62* 56* 50* 50* 48* 50*  CREATININE 2.49* 2.56* 2.44* 2.67* 2.88*  3.06*  CALCIUM 8.5 8.5 8.2* 8.4 8.6 8.5  MG 1.8 1.9 2.0 2.0  --   --   PHOS 4.5 5.3* 5.0* 4.8*  --   --    Liver Function Tests:  Recent Labs Lab 10/10/14 0415  AST 21  ALT 26  ALKPHOS 56  BILITOT 0.8  PROT 5.1*  ALBUMIN 2.5*   No results for input(s): LIPASE, AMYLASE in the last 168 hours. No results for input(s): AMMONIA in the last 168 hours. CBC:  Recent Labs Lab 10/11/14 0400 10/12/14 0346 10/12/14 0950 10/13/14 0400 10/14/14 0436  WBC 6.3 5.2 5.3 5.3 9.5  HGB 7.0* 6.6* 7.4* 7.8* 9.1*  HCT 22.1* 21.3*  23.9* 25.1* 28.9*  MCV 92.1 92.6 90.9 91.3 93.2  PLT 235 204 235 257 261   Cardiac Enzymes: No results for input(s): CKTOTAL, CKMB, CKMBINDEX, TROPONINI in the last 168 hours. BNP: Invalid input(s): POCBNP CBG:  Recent Labs Lab 10/14/14 0734 10/14/14 1227 10/14/14 1735 10/14/14 2115 10/15/14 0810  GLUCAP 106* 180* 107* 194* 132*    Recent Results (from the past 240 hour(s))  Surgical pcr screen     Status: None   Collection Time: 10/08/14  8:31 AM  Result Value Ref Range Status   MRSA, PCR NEGATIVE NEGATIVE Final   Staphylococcus aureus NEGATIVE NEGATIVE Final     Scheduled Meds: . amiodarone  200 mg Oral BID  . enoxaparin (LOVENOX)   95 mg Subcutaneous Q24H  . insulin aspart  0-15 Units Subcutaneous TID WC  . insulin aspart  0-5 Units Subcutaneous QHS  . levothyroxine  125 mcg Oral QAC breakfast  . loperamide  2 mg Oral BID  . mupirocin cream   Topical BID  . pantoprazole  40 mg Oral Q1200   Continuous Infusions: . sodium chloride 10 mL/hr at 10/13/14 0600  . milrinone Stopped (10/13/14 1000)

## 2014-10-15 NOTE — Progress Notes (Signed)
Patient complained of ears being sore where oxygen tubing was. I observed red, irritated skin behind both ears. Reported to [facility] RN and my clinical instructor, who suggested I try padding tubing with gauze. Patient refused gauze on the tubing and requested tubing just be loosened from around ears. Will continue to monitor.

## 2014-10-15 NOTE — Progress Notes (Signed)
Physical Therapy Treatment Patient Details Name: Greg [name] MRN: [id number] DOB: 13-Oct-1931 Today's Date: 10/15/2014    History of Present Illness Greg [name] is a 81 y.o. male, With history of type 2 diabetes mellitus, chronic kidney disease stage III,essential hypertension, sleep apnea, left bundle branch block, systolic heart failure EF now 20-25%, who was admitted to [facility] with  weakness and fall,he was found to have acute on chronic respiratory failure with acute on chronic systolic and diastolic CHF, new onset atrial fibrillation and anemia. , he was also seen by GI for anemia workup and colonoscopy relieved colonic mass. He was thought to be high risk for surgery at [facility] and transferred here for further care. He also developed acute renal failure, Developed R arm DVT there as well. Pt s/p total colectomy 2/19    PT Comments    Pt cooperative and following all commands. Good awareness of deficits and safety awareness. Pt able to stand with +2 assist multiple times with improving ability (as fear/anxiety lessened).   Follow Up Recommendations  SNF;Supervision for mobility/OOB     Equipment Recommendations  None recommended by PT    Recommendations for Other Services       Precautions / Restrictions Precautions Precautions: Fall Precaution Comments: pt with colostomy Restrictions Weight Bearing Restrictions: No    Mobility  Bed Mobility Overal bed mobility: Needs Assistance Bed Mobility: Rolling;Sidelying to Sit Rolling: Max assist Sidelying to sit: Max assist;+2 for physical assistance;HOB elevated (with rail)       General bed mobility comments: max directional verbal and tactile cues for task  Transfers Overall transfer level: Needs assistance Equipment used:  (holding back of recliner positioned in front of him) Transfers: Sit to/from [facility] Sit to Stand: Max assist;+2 physical assistance;From elevated surface Stand pivot  transfers: Max assist;+2 physical assistance       General transfer comment: pt stood x 2 with maxA x2 with gait belt and bed pad. unable to achieve full upright position depsite max v/c's and assist at knees and hips. pt then did stand-pivot to the Rt taking 4 steps with max A for balance, upright posture and weightshifting to allow stepping  Ambulation/Gait                 Stairs            Wheelchair Mobility    Modified Rankin (Stroke Patients Only)       Balance     Sitting balance-Leahy Scale: Poor Sitting balance - Comments: pt EOB ~8 minutes total; progressed from min assist with posterior lean to minguard   Standing balance support: Bilateral upper extremity supported Standing balance-Leahy Scale: Zero                      Cognition Arousal/Alertness: Awake/alert Behavior During Therapy: WFL for tasks assessed/performed Overall Cognitive Status: Impaired/Different from baseline Area of Impairment: Problem solving           Awareness: Anticipatory Problem Solving: Slow processing;Decreased initiation;Requires tactile cues;Requires verbal cues General Comments: very aware of fall risk and appropriately hesitant to scoot too far towards EOB    Exercises Total Joint Exercises Ankle Circles/Pumps: AROM;AAROM;Both (inc heel cord stretch) Heel Slides: AAROM;Both;5 reps (resisted extension; Lt knee limited to 90 (as PTA per wife))    General Comments        Pertinent Vitals/Pain Pain Assessment: No/denies pain    Home Living  Prior Function            PT Goals (current goals can now be found in the care plan section) Acute Rehab PT Goals Patient Stated Goal: go home Progress towards PT goals: Progressing toward goals    Frequency  Min 3X/week    PT Plan Current plan remains appropriate    Co-evaluation             End of Session Equipment Utilized During Treatment: Gait belt Activity  Tolerance: Patient tolerated treatment well Patient left: in chair;with call bell/phone within reach;with family/visitor present;with chair alarm set (lift pad in chair)     Time: 7342-8768 PT Time Calculation (min) (ACUTE ONLY): 26 min  Charges:  $Therapeutic Exercise: 8-22 mins $Therapeutic Activity: 8-22 mins                    G Codes:      Greg [name] 19-Oct-2014, 11:31 AM Pager [phone]

## 2014-10-15 NOTE — Clinical Social Work Note (Signed)
CSW updated patient's wife, Klye Besecker, regarding [place] bed offer. Patient's wife expressed to [place] patient's wife happy patient able to be discharge to [facility] once medically stable at discharge. Weekend CSW updated via written handoff regarding possibility of weekend discharge.  Lubertha Sayres, [place] ([phone]) Licensed Clinical Social Worker Orthopedics [phone]) and Surgical [phone])

## 2014-10-15 NOTE — Progress Notes (Addendum)
ANTICOAGULATION CONSULT NOTE - Follow Up Consult  Pharmacy Consult for Lovenox - adding Warfarin for maintenance Indication: atrial fibrillation and DVT in RUE  Assessment: 26 yom admitted with new onset afib and transfusion dependent anemia, FOBT x3 as outpatient. Anticoagulation was initially held pending GI work-up. Venous doppler ultrasound on 09/23/14 was positive for right upper extremity axillary and brachial acute occlusive DVT. Patient was started on IV heparin then transitioned to lovenox on 2/21 to minimize fluid intake. He has been diagnosed with right colon mass and is s/p partial colectomy with ileostomy.  Circulation. 2012; 126: 638-466 doi: [id number]  In patients with cancer-associated UEDVT, extended LMWH monotherapy is preferred over the administration of vitamin K antagonists. Anticoagulation therapy should be continued as long as the cancer remains active if the thrombotic event was not related to a central venous catheter. After the first 3 to 6 months, anticoagulation therapy may be continued with LMWH or with vitamin K antagonists.  Plan:   --Will add low dose Warfarin 2.5mg  x 1 tonight in light of bleed and recent transfusion. --Consider continuing LMWH as an alternative to Warfarin if you feel cancer places him at increased risk. --F/U daily PT/INR   Rober Minion, PharmD., MS Clinical Pharmacist Pager:  [phone] Thank you for allowing pharmacy to be part of this patients care team. 10/15/2014,1:31 PM  Addendum: Plan: Continue LMWH for now at 95 mg SQ daily.  Rober Minion, PharmD., MS Clinical Pharmacist Pager:  [phone]

## 2014-10-16 ENCOUNTER — Inpatient Hospital Stay
Admission: RE | Admit: 2014-10-16 | Discharge: 2014-10-31 | Disposition: A | Discharge status: Nursing Home (Includes ICF) | Payer: Medicare Other | Source: Ambulatory Visit | Attending: Pulmonary Disease | Admitting: Pulmonary Disease

## 2014-10-16 LAB — CBC
HEMATOCRIT: 28.8 % — AB (ref 39.0–52.0)
HEMOGLOBIN: 8.9 g/dL — AB (ref 13.0–17.0)
MCH: 29 pg (ref 26.0–34.0)
MCHC: 30.9 g/dL (ref 30.0–36.0)
MCV: 93.8 fL (ref 78.0–100.0)
PLATELETS: 257 10*3/uL (ref 150–400)
RBC: 3.07 MIL/uL — AB (ref 4.22–5.81)
RDW: 18.4 % — AB (ref 11.5–15.5)
WBC: 14 10*3/uL — AB (ref 4.0–10.5)

## 2014-10-16 LAB — BASIC METABOLIC PANEL
ANION GAP: 9 (ref 5–15)
BUN: 47 mg/dL — ABNORMAL HIGH (ref 6–23)
CHLORIDE: 99 mmol/L (ref 96–112)
CO2: 23 mmol/L (ref 19–32)
CREATININE: 2.88 mg/dL — AB (ref 0.50–1.35)
Calcium: 8.5 mg/dL (ref 8.4–10.5)
GFR calc Af Amer: 22 mL/min — ABNORMAL LOW (ref >90)
GFR calc non Af Amer: 19 mL/min — ABNORMAL LOW (ref >90)
GLUCOSE: 109 mg/dL — AB (ref 70–99)
Potassium: 4.5 mmol/L (ref 3.5–5.1)
Sodium: 131 mmol/L — ABNORMAL LOW (ref 135–145)

## 2014-10-16 LAB — GLUCOSE, CAPILLARY
Glucose-Capillary: 111 mg/dL — ABNORMAL HIGH (ref 70–99)
Glucose-Capillary: 164 mg/dL — ABNORMAL HIGH (ref 70–99)

## 2014-10-16 LAB — PROTIME-INR
INR: 1.27 (ref 0.00–1.49)
PROTHROMBIN TIME: 16 s — AB (ref 11.6–15.2)

## 2014-10-16 MED ORDER — AMIODARONE HCL 200 MG PO TABS: 200.0000 mg | ORAL_TABLET | Freq: Two times a day (BID) | ORAL | Status: DC

## 2014-10-16 MED ORDER — ACETAMINOPHEN 325 MG PO TABS: 325.0000 mg | ORAL_TABLET | Freq: Four times a day (QID) | ORAL | Status: AC | PRN

## 2014-10-16 MED ORDER — MUPIROCIN CALCIUM 2 % EX CREA: TOPICAL_CREAM | Freq: Two times a day (BID) | CUTANEOUS | Status: DC

## 2014-10-16 MED ORDER — LOPERAMIDE HCL 2 MG PO CAPS
2.0000 mg | ORAL_CAPSULE | ORAL | Status: AC | PRN
Start: 2014-10-16 — End: ?

## 2014-10-16 MED ORDER — PANTOPRAZOLE SODIUM 40 MG PO TBEC: 40.0000 mg | DELAYED_RELEASE_TABLET | Freq: Every day | ORAL | Status: DC

## 2014-10-16 MED ORDER — ENOXAPARIN SODIUM 100 MG/ML ~~LOC~~ SOLN: 95.0000 mg | SUBCUTANEOUS | Status: DC

## 2014-10-16 NOTE — Discharge Summary (Signed)
Physician Discharge Summary  Greg [name] LYY:[id number] DOB: [birth year] DOA: 09/22/2014  PCP: [name], MD  Admit date: 09/22/2014 Discharge date: 10/16/2014  Time spent: >35 minutes  Recommendations for Outpatient Follow-up:  SNF F/u with Dr. Oneida Alar gastroenterology ion 2 weeks  F/u with surgery in 1-2 weeks F/u with cardiology in 1 week  Discharge Diagnoses:  Principal Problem:   Colonic mass Active Problems:   Anemia of chronic disease   Abnormal EKG- known IVCD (LBBB type)   Fall   Acute respiratory failure with hypoxia   Atrial fibrillation/ atrilal flutter- new   Elevated troponin   Hyperkalemia   Elevated LFTs   Acute combined systolic and diastolic congestive heart failure   Type 2 diabetes mellitus with renal manifestations   Anemia due to GI blood loss   Protein-calorie malnutrition, severe   PUD (peptic ulcer disease)   Shock   Acute renal failure superimposed on stage 4 chronic kidney disease   Discharge Condition: stable   Diet recommendation: low sodium   Filed Weights   10/14/14 0500 10/15/14 0629 10/16/14 0616  Weight: 94.983 kg (209 lb 6.4 oz) 91.354 kg (201 lb 6.4 oz) 86.229 kg (190 lb 1.6 oz)    History of present illness:   81 year old male with past medical history of diabetes mellitus type 2, chronic kidney disease stage III with baseline creatinine of 2.3, hypertension, transfusion dependent anemia (was supposed to see GI just prior to the admission for transfusion), sleep apnea, systolic heart failure with ejection fraction in 20-25%. Patient was admitted to [facility] because of weakness and fall. He was found to have acute on chronic respiratory failure because of acute on chronic systolic and diastolic CHF. He had new onset atrial fibrillation. Patient was seen by cardiology and has required dobutamine drip along with diuretics. In addition, while at [facility] patient was found to have right upper extremity DVT and has required heparin  drip at that time. Further, renal was consulted for acute renal failure.   As noted above, patient has history of transfusion dependent anemia. GI was consulted for input on management. Patient is status post endoscopy and colonoscopy in 10/04/2014. 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 Colonoscopy revealed right colon mass.  Surgery was consulted 10/05/2014 after colonoscopy revealed right colon mass. Patient underwent exploratory laparotomy with partial colectomy with ileostomy and liver biopsy all done 10/08/2014. He was intubated for the surgery and the decision was made to keep patient intubated postoperatively. Patient was extubated 10/10/2014 and has required BiPAP for additional 24 hours. His ICU course was complicated with need for pressor support but off pressors from 10/12/2014.   Hospital Course:  1. Acute respiratory failure with hypoxia / ventilatory dependent respiratory failure secondary to pulmonary edema / pleural effusions  - Patient has history of chronic respiratory failure in addition to congestive heart failure, obstructive sleep apnea.  - Patient required surgery for removal of the right colon mass and was intubated postoperatively 10/08/2014. Patient was successfully extubated on 10/10/2014 at which time patient require BiPAP for 24 more hours. BiPAP stopped 10/11/2014. Patient transferred out of stepdown unit 10/13/2014. - Patient continues to be stable respiratory status wise. Patient is saturating 93% on room air.  1. Acute on chronic combined systolic and diastolic heart failure / hypovolemic shock; Complicated hospital course that required multidisciplinary team involvement including but not limited to critical care and cardiology.Patient has required dobutamine drip, milrinone  drip, Lasix. At this time all those aren't stopped as noted above under significant events.Patient has required pressor  support throughout the hospital stay. Pressors discontinued 10/12/2014. - Beta blocker remains on hold because of hypotension, diuretics on hold; patient is clinically euvolemic; will need to follow up with cardiology in 1-23 weeks to reevaluate if needs diuretics, BB to resume   2. New onset atrial fibrillation / atrial flutter / history of left bundle branch block - CHADSVASC - 6. Rate controlled with amiodarone 200 mg twice daily; patient seems at high risk for bleeding due to recent GIB, active gastric, duodenal ulcers and bleeding requiring blood transfusions; ; so we will continue anticoagulation with Lovenox 95 mg daily for now; recommended to f/u with GI in 2 weeks if no furtrher bleeding, no need for transfusion, he might be a candidate for coumadin  3. Right upper extremity DVT Found on upper extremity Doppler 09/23/2014. He was initially on heparin drip.  Patient currently on Lovenox 95 mg subcutaneous daily as above  4. Acute on chronic disease, chronic kidney disease stage IV Baseline creatinine around 2.3. Admission creatinine above baseline values. Patient did receive Lasix on the admission, which is on hold due to episodes of AKI; currently creatinine remains stable, no s/s of uremia or fluid overload; will need to repeat BMP in 3-5 days, and outpatient nephrology follow up   5. Right colon mass / colon cancer stage T4a/N2b/M1 Status post right colectomy, ileostomy 10/08/2014. - Per surgery, no further recommendations, continue outpatient oncology follow up in 1-2 weeks  6. Transfusion dependent anemia, anemia of chronic disease Likely in the setting of colon cancer; currently no s/s of further bleeding; patient is on anticoagulation with lovenox, and patient needs CBC in 3-5 days at SNF  with intermittent transfusions as needed;  7. Severe protein calorie malnutrition In the context of acute illness. Diet as tolerated. 8. Acute metabolic encephalopathy Likely due to deconditioning,  complex medical issues;  Resolved  - Continue with PT - recommendation is for skilled nursing facility placement.   Procedures: 2/04 Echo >> EF 20 to 25% 2/04 Doppler Rt arm >> DVT in axillary and brachial veins 2/15 EGD >> stricture at GE junction, small HH, multiple small ulcers in gastric antrum/bulb and 2nd part of duodenum 2/15 colonoscopy >> Rt colon mass  2/16 CT abd/pelvis >> decreased attenuation Lt liver, thickening proximal ascending colon, b/l pleural effusions with compressive ATX (i.e. Studies not automatically included, echos, thoracentesis, etc; not x-rays)  Consultations:  Surgery  Cardiology  pulmonology     Discharge Exam: Filed Vitals:   10/16/14 0616  BP: 110/48  Pulse: 83  Temp: 99.2 F (37.3 C)  Resp: 16    General: alert,orineted  Cardiovascular: s1,s2 rrr Respiratory: CTA BL  Discharge Instructions  Discharge Instructions    Diet - low sodium heart healthy    Complete by:  As directed      Discharge instructions    Complete by:  As directed   Please follow up with gastroenterologist Dr.Fields in 2 weeks Please follow up with surgery  Please follow up with cardiology in 1-2 weeks     Increase activity slowly    Complete by:  As directed             Medication List    STOP taking these medications        aspirin EC 81 MG tablet     carvedilol 3.125 MG tablet  Commonly known as:  COREG  TAKE these medications        acetaminophen 325 MG tablet  Commonly known as:  TYLENOL  Take 1-2 tablets (325-650 mg total) by mouth every 6 (six) hours as needed for fever, headache, mild pain or moderate pain.     amiodarone 200 MG tablet  Commonly known as:  PACERONE  Take 1 tablet (200 mg total) by mouth 2 (two) times daily.     atorvastatin 20 MG tablet  Commonly known as:  LIPITOR  Take 20 mg by mouth daily.     CENTRUM SILVER PO  Take 1 tablet by mouth daily.     enoxaparin 100 MG/ML injection  Commonly known as:  LOVENOX   Inject 0.95 mLs (95 mg total) into the skin daily.     levothyroxine 125 MCG tablet  Commonly known as:  SYNTHROID, LEVOTHROID  Take 125 mcg by mouth daily before breakfast.     loperamide 2 MG capsule  Commonly known as:  IMODIUM  Take 1 capsule (2 mg total) by mouth as needed for diarrhea or loose stools.     mupirocin cream 2 %  Commonly known as:  BACTROBAN  Apply topically 2 (two) times daily.     pantoprazole 40 MG tablet  Commonly known as:  PROTONIX  Take 1 tablet (40 mg total) by mouth daily at 12 noon.       Allergies  Allergen Reactions  . Celery Oil     vomiting       Follow-up Information    Follow up with Zenovia Jarred, MD On 10/22/2014.   Specialty:  General Surgery   Why:  For suture removal. 1:30 PM nurse visit to remove staples.   Contact information:   [place] [address] [phone]       Follow up with Zenovia Jarred, MD On 11/03/2014.   Specialty:  General Surgery   Why:  For wound re-check. 11:15 AM.   Contact information:   [address] [place] [address] [phone]       Follow up with Thompson Grayer, MD In 1 week.   Specialty:  Cardiology   Contact information:   [place] [address] [place] [address] [phone]       Follow up with [name], MD. Schedule an appointment as soon as possible for a visit in 2 weeks.   Specialty:  Gastroenterology   Contact information:   [place] [address] [place] [address] [phone]        The results of significant diagnostics from this hospitalization (including imaging, microbiology, ancillary and laboratory) are listed below for reference.    Significant Diagnostic Studies: Ct Abdomen Pelvis Wo Contrast  10/05/2014   CLINICAL DATA:  Known colonic mass on recent colonoscopy  EXAM: CT ABDOMEN AND PELVIS WITHOUT CONTRAST  TECHNIQUE: Multidetector CT imaging of the abdomen and pelvis was performed  following the standard protocol without IV contrast.  COMPARISON:  09/02/2013  FINDINGS: Bilateral lower lobe consolidation with associated moderate effusions are seen. No parenchymal nodules are noted.  The gallbladder, spleen, adrenal glands and pancreas are normal in their CT appearance. The liver is predominately homogeneous in attenuation. A few vague areas of decreased attenuation are noted within the medial and lateral segment of the left lobe of the liver best seen on image number 21 of series 2. Given the clinical history the possibility of underlying metastatic disease could not  be totally excluded. The kidneys are well visualized bilaterally and reveal no renal calculi or obstructive changes.  The previously described right lateral abdominal wall hernia has been rib reduced and repair. No residual hernia is seen. Some wall thickening is noted in the distal terminal ileum and also within the proximal ascending colon. These changes in the ascending colon may be related to the known underlying colonic mass. Scattered diverticular change is noted without diverticulitis. No significant pelvic mass lesion is seen. The bladder is well distended. No sizable lymphadenopathy is seen. Degenerative changes of the lumbar spine are seen. No bony lesion is noted.  IMPRESSION: Bilateral moderate pleural effusions with associated lower lobe atelectasis/ consolidation.  Wall thickening in the ascending colon likely related to the patient's given clinical history of colonic mass.  Hypodensities within the liver of uncertain etiology. The possibility of metastatic disease cannot be totally excluded. Ultrasound of the liver may be helpful in this regard.  Diverticulosis without diverticulitis.  No other focal abnormality is seen.   Electronically Signed   By: Inez Catalina M.D.   On: 10/05/2014 17:03   Dg Chest 2 View  09/22/2014   CLINICAL DATA:  Left anterior shoulder abrasion, Fall.  EXAM: CHEST  2 VIEW  COMPARISON:   09/02/2013  FINDINGS: There is a small to moderate right pleural effusion. Right lower lobe atelectasis or infiltrate. No confluent opacity on the left. Cardiomegaly with vascular congestion. No acute bony abnormality.  IMPRESSION: Small to moderate right pleural effusion with right lower lobe atelectasis or consolidation.  Cardiomegaly, vascular congestion.   Electronically Signed   By: Rolm Baptise M.D.   On: 09/22/2014 08:20   Ct Head Wo Contrast  09/22/2014   CLINICAL DATA:  81 year old male with dizziness and fall this morning when going to the bathroom. Left side head and year injury. Labored breathing. Initial encounter.  EXAM: CT HEAD WITHOUT CONTRAST  TECHNIQUE: Contiguous axial images were obtained from the base of the skull through the vertex without intravenous contrast.  COMPARISON:  None.  FINDINGS: Chronic paranasal sinus surgery and changes of chronic sinusitis. Mastoids are clear.  No visible orbit or scalp soft tissue injury identified. No acute osseous abnormality identified.  Scattered areas of chronic appearing encephalomalacia in the left MCA territory. Chronic lacunar infarct in the left thalamus. Mild ex vacuo enlargement of the left lateral ventricle. No midline shift, mass effect, or evidence of intracranial mass lesion. No acute intracranial hemorrhage identified. No evidence of cortically based acute infarction identified. No suspicious intracranial vascular hyperdensity. Possible small chronic lacunar infarcts in the cerebellar tonsils.  IMPRESSION: 1. No acute intracranial abnormality. No acute traumatic injury identified. 2. Chronic small and medium-sized vessel ischemia, mostly in the left hemisphere.   Electronically Signed   By: Lars Pinks M.D.   On: 09/22/2014 08:11   US Venous Img Upper Uni Right  09/23/2014   CLINICAL DATA:  Right upper extremity edema, pain  EXAM: RIGHT UPPER EXTREMITY VENOUS DOPPLER ULTRASOUND  TECHNIQUE: Gray-scale sonography with graded compression, as  well as color Doppler and duplex ultrasound were performed to evaluate the upper extremity deep venous system from the level of the subclavian vein and including the jugular, axillary, basilic, radial, ulnar and upper cephalic vein. Spectral Doppler was utilized to evaluate flow at rest and with distal augmentation maneuvers.  COMPARISON:  None.  FINDINGS: Contralateral Subclavian Vein: Respiratory phasicity is normal and symmetric with the symptomatic side. No evidence of thrombus. Normal compressibility.  Internal Jugular  Vein: No evidence of thrombus. Normal compressibility, respiratory phasicity and response to augmentation.  Subclavian Vein: No evidence of thrombus. Normal compressibility, respiratory phasicity and response to augmentation.  Axillary Vein: Intraluminal hypoechoic thrombus present in the right axillary vein appearing occlusive. Axillary vein is noncompressible.  Cephalic Vein: No evidence of thrombus. Normal compressibility, respiratory phasicity and response to augmentation.  Basilic Vein: No evidence of thrombus. Normal compressibility, respiratory phasicity and response to augmentation.  Brachial Veins: Axillary thrombus extends into the brachial vein which is also occlusive and noncompressible. Brachial vein is duplicated.  Radial Veins: No evidence of thrombus. Normal compressibility, respiratory phasicity and response to augmentation.  Ulnar Veins: No evidence of thrombus. Normal compressibility, respiratory phasicity and response to augmentation.  Venous Reflux:  None visualized.  Other Findings:  Subcutaneous for arm edema evident.  IMPRESSION: Positive exam for right upper extremity axillary and brachial acute occlusive DVT.  These results will be called to the ordering clinician or representative by the Radiology Department at the imaging location.   Electronically Signed   By: Daryll Brod M.D.   On: 09/23/2014 16:02   Dg Chest Port 1 View  10/10/2014   CLINICAL DATA:  ET tube  placement  EXAM: PORTABLE CHEST - 1 VIEW  COMPARISON:  10/09/2014  FINDINGS: The endotracheal tube is just below the level of the clavicular heads. The nasogastric tube extends into the stomach. There is a left jugular central line with tip in the upper SVC. No pneumothorax. New there is a small right pleural effusion. There are mild basilar opacities bilaterally without significant interval change.  IMPRESSION: Persistent right effusion and mild bibasilar airspace opacities without significant interval change.   Electronically Signed   By: Andreas Newport M.D.   On: 10/10/2014 06:36   Dg Chest Port 1 View  10/09/2014   CLINICAL DATA:  Shock, central line placement, diabetes, hypertension, cardiomyopathy  EXAM: PORTABLE CHEST - 1 VIEW  COMPARISON:  Portable exam 1513 hours compared to 0555 hours  FINDINGS: Tip of endotracheal tube projects 6.7 cm above carina.  Nasogastric tube extends into stomach.  BILATERAL internal jugular catheters with tips projecting over proximal SVC.  Minimal enlargement of cardiac silhouette.  Mediastinal contours and pulmonary vascularity normal.  Bibasilar effusions and minimal atelectasis.  Upper lungs clear.  No pneumothorax.  Bones demineralized.  IMPRESSION: Line and tube positions as above.  Bibasilar effusions and atelectasis little changed.   Electronically Signed   By: Lavonia Dana M.D.   On: 10/09/2014 15:54   Dg Chest Port 1 View  10/09/2014   CLINICAL DATA:  ET tube placement  EXAM: PORTABLE CHEST - 1 VIEW  COMPARISON:  10/08/2014  FINDINGS: Endotracheal tube is just below the clavicular heads. Nasogastric tube extends into the stomach. Right jugular central line extends into the upper SVC. Basilar opacities persist bilaterally. Small right pleural effusion persists.  IMPRESSION: Persistent basilar opacities without significant interval change   Electronically Signed   By: Andreas Newport M.D.   On: 10/09/2014 06:24   Dg Chest Port 1 View  10/08/2014   CLINICAL  DATA:  Hypoxia  EXAM: PORTABLE CHEST - 1 VIEW  COMPARISON:  October 07, 2014  FINDINGS: Endotracheal tube tip is 5.1 cm above carina. Central catheter tip is in the superior vena cava. Nasogastric tube and side port are below the diaphragm. No pneumothorax. There is bibasilar alveolar consolidation with effusions bilaterally. Heart is enlarged. The pulmonary vascularity is normal.  IMPRESSION: Tube and catheter positions as  described without pneumothorax. Findings consistent with a degree of congestive heart failure. Superimposed pneumonia in the bases cannot be excluded. Both entities may exist concurrently.   Electronically Signed   By: Lowella Grip III M.D.   On: 10/08/2014 15:28   Dg Chest Port 1 View  10/07/2014   CLINICAL DATA:  Shortness of breath  EXAM: PORTABLE CHEST - 1 VIEW  COMPARISON:  09/22/2014  FINDINGS: Cardiac shadow remains enlarged. Bilateral pleural effusions are noted right greater than left. Given some variation in the patient positioning the overall appearance is stable. No new focal infiltrate is seen.  IMPRESSION: Stable effusions.  No new focal abnormality is noted.   Electronically Signed   By: Inez Catalina M.D.   On: 10/07/2014 07:56   Dg Shoulder Left  09/22/2014   CLINICAL DATA:  Fall last night with abrasion and injury to left shoulder. Initial encounter.  EXAM: LEFT SHOULDER - 2+ VIEW  COMPARISON:  None.  FINDINGS: No acute fracture or dislocation identified. There are mild degenerative changes involving the [facility] joint and glenohumeral joint. No bony lesions or destruction.  IMPRESSION: No acute fracture identified.   Electronically Signed   By: Aletta Edouard M.D.   On: 09/22/2014 08:18    Microbiology: Recent Results (from the past 240 hour(s))  Surgical pcr screen     Status: None   Collection Time: 10/08/14  8:31 AM  Result Value Ref Range Status   MRSA, PCR NEGATIVE NEGATIVE Final   Staphylococcus aureus NEGATIVE NEGATIVE Final    Comment:        The Xpert  SA Assay (FDA approved for NASAL specimens in patients over 87 years of age), is one component of a comprehensive surveillance program.  Test performance has been validated by [facility] for patients greater than or equal to 73 year old. It is not intended to diagnose infection nor to guide or monitor treatment.      Labs: Basic Metabolic Panel:  Recent Labs Lab 10/10/14 0415 10/11/14 0400 10/12/14 0346 10/13/14 0400 10/14/14 0436 10/16/14 0500  NA 132* 131* 133* 133* 131* 131*  K 4.0 3.6 3.4* 4.0 4.3 4.5  CL 93* 94* 94* 97 100 99  CO2 29 29 29 24 21 23   GLUCOSE 151* 115* 85 142* 108* 109*  BUN 56* 50* 50* 48* 50* 47*  CREATININE 2.56* 2.44* 2.67* 2.88* 3.06* 2.88*  CALCIUM 8.5 8.2* 8.4 8.6 8.5 8.5  MG 1.9 2.0 2.0  --   --   --   PHOS 5.3* 5.0* 4.8*  --   --   --    Liver Function Tests:  Recent Labs Lab 10/10/14 0415  AST 21  ALT 26  ALKPHOS 56  BILITOT 0.8  PROT 5.1*  ALBUMIN 2.5*   No results for input(s): LIPASE, AMYLASE in the last 168 hours. No results for input(s): AMMONIA in the last 168 hours. CBC:  Recent Labs Lab 10/12/14 0346 10/12/14 0950 10/13/14 0400 10/14/14 0436 10/16/14 0500  WBC 5.2 5.3 5.3 9.5 14.0*  HGB 6.6* 7.4* 7.8* 9.1* 8.9*  HCT 21.3* 23.9* 25.1* 28.9* 28.8*  MCV 92.6 90.9 91.3 93.2 93.8  PLT 204 235 257 261 257   Cardiac Enzymes: No results for input(s): CKTOTAL, CKMB, CKMBINDEX, TROPONINI in the last 168 hours. BNP: BNP (last 3 results)  Recent Labs  09/22/14 0731 10/10/14 0415  BNP 1657.0* 155.8*    ProBNP (last 3 results) No results for input(s): PROBNP in the last 8760 hours.  CBG:  Recent Labs Lab 10/15/14 1153 10/15/14 1658 10/15/14 2221 10/16/14 0744 10/16/14 1149  GLUCAP 187* 112* 132* 111* 164*       Signed:  Jilliane Kazanjian N  [facility] 10/16/2014, 1:01 PM

## 2014-10-16 NOTE — Discharge Planning (Addendum)
Report called to Hotel manager at [facility] at [phone]. [facility]AR here for trasport at 80, pt's wife called and informed that. he is on the way.

## 2014-10-16 NOTE — Clinical Social Work Note (Signed)
CSW made aware patient ready for d/c to [facility]. CSW contacted facility and spoke with Juliann Pulse who confirmed bed availability. Patient and wife made aware of d/c as wife was present at bedside. CSW prepared d/c packet and placed in patient's shadow chart. CSW faxed d/c summary to facility. CSW spoke with RN Edd Arbour and provided number for report: [phone]. CSW to arrange transportation via [place]. No further needs. CSW signing off.   [place], [place] Weekend Clinical Social Worker [phone]

## 2014-10-16 NOTE — Progress Notes (Signed)
     SUBJECTIVE: No chest pain or SOB  BP 110/48 mmHg  Pulse 83  Temp(Src) 99.2 F (37.3 C) (Oral)  Resp 16  Ht 5\' 11"  (1.803 m)  Wt 190 lb 1.6 oz (86.229 kg)  BMI 26.53 kg/m2  SpO2 97%  Intake/Output Summary (Last 24 hours) at 10/16/14 1000 Last data filed at 10/16/14 0600  Gross per 24 hour  Intake    240 ml  Output   1325 ml  Net  -1085 ml    PHYSICAL EXAM General: Well developed, well nourished, in no acute distress. Alert and oriented x 3.  Psych:  Good affect, responds appropriately Neck: No JVD. No masses noted.  Lungs: Clear bilaterally with no wheezes or rhonci noted.  Heart: irreg with no murmurs noted. Abdomen: Bowel sounds are present. Soft, non-tender.  Extremities: No lower extremity edema.   LABS: Basic Metabolic Panel:  Recent Labs  10/14/14 0436 10/16/14 0500  NA 131* 131*  K 4.3 4.5  CL 100 99  CO2 21 23  GLUCOSE 108* 109*  BUN 50* 47*  CREATININE 3.06* 2.88*  CALCIUM 8.5 8.5   CBC:  Recent Labs  10/14/14 0436 10/16/14 0500  WBC 9.5 14.0*  HGB 9.1* 8.9*  HCT 28.9* 28.8*  MCV 93.2 93.8  PLT 261 257   Current Meds: . amiodarone  200 mg Oral BID  . enoxaparin (LOVENOX) injection  95 mg Subcutaneous Q24H  . insulin aspart  0-15 Units Subcutaneous TID WC  . insulin aspart  0-5 Units Subcutaneous QHS  . levothyroxine  125 mcg Oral QAC breakfast  . loperamide  2 mg Oral BID  . mupirocin cream   Topical BID  . off the beat book   Does not apply Once  . pantoprazole  40 mg Oral Q1200  . sodium chloride  2 spray Each Nare QID     ASSESSMENT AND PLAN:  1. Acute on chronic systolic CHF: EF 28-31% by echo this admission. He is euvolemic.  Would not resume diuretics at this time. Beta blocker has been on hold due to hypotension. No acei/arb/spiro/arni in setting of acute on chronic renal failure. Would recommend cardiology transition of care visit on discharge.  2. Afib/flutter: Persistent. Rate controlled. Cont amiodarone 200  mg po BID. On lovenox for now. H/H remain low but stable. CHA2DS2VASc = 5. Will need longterm oral anticoagulation provided that anemia subsides and no further GIB.  Given renal failure, only option would be coumadin.  Start coumadin if ok with GI.  If not a coumadin candidate, would probably stop lovenox which carries a higher bleeding risk.  3. RUE DVT: On therapeutic dosing of lovenox (renally adjusted). As above, will need transition to oral anti-coagulation  (coumadin) at some point. Continue to follow H/H.   4. 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 Diuretics on hold in setting of euvolemia.  5. Colonic Mass: s/p ex lap with partial colectomy and ileostomy. Mgmt per surgery.  6. Acute blood loss anemia: stable. Follow.    Cardiology to see as needed over the weekend Please call with questions  Thompson Grayer  2/27/201610:00 AM

## 2014-10-18 ENCOUNTER — Encounter (HOSPITAL_COMMUNITY): Payer: Self-pay

## 2014-10-18 NOTE — H&P (Signed)
NAMEMarland Kitchen  CALLOWAY, ANDRUS NO.:  [id number]  MEDICAL RECORD NO.:  [id number]  LOCATION:  6N32C                        FACILITY:  [facility]  PHYSICIAN:  Deepa Barthel L. Luan Pulling, M.D.DATE OF BIRTH:  09-10-1931  DATE OF ADMISSION:  09/22/2014 DATE OF DISCHARGE:  02/27/2016LH                             HISTORY & PHYSICAL   REASON FOR ADMISSION:  Rehabilitation.  HISTORY:  Mr. Sawa is an 81 year old who has had a long medical history with multiple problems.  He is known to have diabetes, chronic renal failure, hypertension, and anemia.  He has been in the hospital with an episode of a fall, was found to have atrial fibrillation with rapid ventricular response, and eventually was found to have colon cancer.  He had surgery for colon cancer done in [place].  He was also found to have worsened systolic heart failure with ejection fraction of 20-25%.  He was sent to the nursing home for rehabilitation. In addition to the other problems, he has a history of acute respiratory failure, elevated liver function testing, protein-calorie malnutrition, and has DVT in his right arm which is being treated with Lovenox.  SOCIAL HISTORY:  He lives at home with family.  FAMILY HISTORY:  Not known to be positive for colon cancer.  REVIEW OF SYSTEMS:  Except as mentioned is negative.  PHYSICAL EXAMINATION:  GENERAL:  A well-developed, well-nourished, somewhat obese male who is in no acute distress. HEENT:  His pupils are reactive.  Nose and throat are clear.  Mucous membranes are moist. NECK:  Supple without masses. CHEST:  Rhonchi bilaterally. HEART:  Irregularly irregular with a systolic murmur. ABDOMEN:  Soft.  He had surgery. EXTREMITIES:  Trace edema. CENTRAL NERVOUS SYSTEM:  Grossly intact.  ASSESSMENT:  He has multiple medical problems including colon cancer, acute on chronic heart failure, chronic atrial fibrillation, DVT, diabetes, atrial fibrillation, and he is at the  skilled care facility for rehabilitation.  I told him that we want him to continue hard work at rehab so that we can get him home which is his goal.     Percell Miller L. Luan Pulling, M.D.     ELH/MEDQ  D:  10/17/2014  T:  10/18/2014  Job:  [id number]

## 2014-10-19 ENCOUNTER — Encounter (HOSPITAL_COMMUNITY)
Admission: RE | Admit: 2014-10-19 | Discharge: 2014-10-19 | Disposition: A | Discharge status: Home / Self Care | Payer: Medicare Other | Source: Skilled Nursing Facility | Attending: Pulmonary Disease | Admitting: Pulmonary Disease

## 2014-10-19 LAB — BASIC METABOLIC PANEL
ANION GAP: 4 — AB (ref 5–15)
BUN: 71 mg/dL — AB (ref 6–23)
CO2: 22 mmol/L (ref 19–32)
CREATININE: 3.21 mg/dL — AB (ref 0.50–1.35)
Calcium: 8.2 mg/dL — ABNORMAL LOW (ref 8.4–10.5)
Chloride: 106 mmol/L (ref 96–112)
GFR, EST AFRICAN AMERICAN: 20 mL/min — AB (ref >90)
GFR, EST NON AFRICAN AMERICAN: 17 mL/min — AB (ref >90)
GLUCOSE: 117 mg/dL — AB (ref 70–99)
Potassium: 4.9 mmol/L (ref 3.5–5.1)
Sodium: 132 mmol/L — ABNORMAL LOW (ref 135–145)

## 2014-10-19 LAB — CBC WITH DIFFERENTIAL/PLATELET
BASOS PCT: 0 % (ref 0–1)
Basophils Absolute: 0 10*3/uL (ref 0.0–0.1)
Eosinophils Absolute: 0.2 10*3/uL (ref 0.0–0.7)
Eosinophils Relative: 3 % (ref 0–5)
HCT: 29.6 % — ABNORMAL LOW (ref 39.0–52.0)
Hemoglobin: 9 g/dL — ABNORMAL LOW (ref 13.0–17.0)
LYMPHS ABS: 1.6 10*3/uL (ref 0.7–4.0)
LYMPHS PCT: 24 % (ref 12–46)
MCH: 28.2 pg (ref 26.0–34.0)
MCHC: 30.4 g/dL (ref 30.0–36.0)
MCV: 92.8 fL (ref 78.0–100.0)
Monocytes Absolute: 0.5 10*3/uL (ref 0.1–1.0)
Monocytes Relative: 8 % (ref 3–12)
NEUTROS ABS: 4.1 10*3/uL (ref 1.7–7.7)
NEUTROS PCT: 65 % (ref 43–77)
Platelets: 294 10*3/uL (ref 150–400)
RBC: 3.19 MIL/uL — AB (ref 4.22–5.81)
RDW: 17.4 % — ABNORMAL HIGH (ref 11.5–15.5)
WBC: 6.4 10*3/uL (ref 4.0–10.5)

## 2014-10-25 ENCOUNTER — Encounter: Payer: Self-pay | Admitting: Nurse Practitioner

## 2014-10-25 ENCOUNTER — Encounter: Payer: Medicare Other | Admitting: Nurse Practitioner

## 2014-10-25 ENCOUNTER — Ambulatory Visit (INDEPENDENT_AMBULATORY_CARE_PROVIDER_SITE_OTHER): Payer: Medicare Other | Admitting: Nurse Practitioner

## 2014-10-25 VITALS — BP 96/38 | HR 77

## 2014-10-25 DIAGNOSIS — I4891 Unspecified atrial fibrillation: Secondary | ICD-10-CM

## 2014-10-25 DIAGNOSIS — N184 Chronic kidney disease, stage 4 (severe): Secondary | ICD-10-CM

## 2014-10-25 DIAGNOSIS — I5022 Chronic systolic (congestive) heart failure: Secondary | ICD-10-CM

## 2014-10-25 NOTE — Progress Notes (Signed)
This is documentation of my visit of 10/24/2014. He is much more alert than on my last visit. He is sitting up in a wheelchair and looks more comfortable. He has clearly lost weight. He looks chronically sick. Exam otherwise shows that he is still in atrial fibrillation. I don't hear a gallop. His chest is clear. Abdomen shows his surgical scar is healing well except for an approximately 2 cm area that has Steri-Strips and has an antibacterial ointment on it. He has no edema central nervous system examination is grossly intact  He has multiple medical problems including colon cancer which is metastatic. He's had atrial fibrillation with rapid ventricular response and his heart rate is pretty well controlled today. He had acute on chronic systolic heart failure with reduction in his ejection fraction from 45% to 25% in approximately a year. He had anasarca related to that. He's had trouble with anemia that I think is related to his colon cancer. He has failure to thrive and I believe he has malnutrition now as well  He will continue his rehabilitation. He has improved significantly.

## 2014-10-25 NOTE — Patient Instructions (Signed)
Your physician recommends that you continue on your current medications as directed. Please refer to the Current Medication list given to you today.  Your physician recommends that you keep your schedule  follow-up appointment with Clemmie Krill    Follow up with GI/Oncology per PCP

## 2014-10-25 NOTE — Progress Notes (Addendum)
Electrophysiology Office Note Date: 10/25/2014  ID:  Greg [name], DOB 16-May-1932, MRN [id number]  PCP: Greg Bogus, MD Primary Cardiologist: Isaias Cowman Greg [name] is a 81 y.o. male is seen today for Dr Harl Bowie as a post hospital visit. He was admitted 2/3 through 10/16/14 with acute on chronic systolic heart failure, renal insufficiency, atrial fibrillation and anemia.  He was found to have colonic mass on colonoscopy and underwent laparotomy with partial colectomy. He required pressor support for a short time post surgery. He also was found to have newly diagnosed atrial fibrillation/atrial flutter and was discharged home on Lovenox with decision about long-term anticoagulation deferred to GI.  He was discharged to rehab. He reports ongoing anorexia and weakness.  He feels that he is making progress.   Weight at discharge was 190 pounds. Due to hypotension, HF medications were not resumed post surgery.  ACE-I/ARB/spironolactone on hold due to renal insufficiency.   Lab work 10/19/14 is notable for creat of 3.21 (up from 2.88 10/16/14).  Hgb was stable at 9.   He has scheduled follow-up with GI next week.   Past Medical History  Diagnosis Date  . Type 2 diabetes mellitus   . Anemia of chronic disease   . CKD (chronic kidney disease) stage 3, GFR 30-59 ml/min   . Herpes zoster   . Obesity   . Exposure to hazardous substance   . Intermittent Leukopenia   . Essential hypertension   . Sleep apnea     Sleep Apnea score of 7  . Cardiomyopathy     LVEF 40-45% January 2015  . Left bundle branch block    Past Surgical History  Procedure Laterality Date  . Nasal polyp surgery    . Ventral hernia repair N/A 09/14/2013    Procedure: HERNIA REPAIR VENTRAL ADULT WITH MESH;  Surgeon: Jamesetta So, MD;  Location: [facility];  Service: General;  Laterality: N/A;  . Insertion of mesh N/A 09/14/2013    Procedure: INSERTION OF MESH;  Surgeon: Jamesetta So, MD;  Location: [facility];  Service:  General;  Laterality: N/A;  . Colonoscopy N/A 10/04/2014    Procedure: COLONOSCOPY;  Surgeon: Danie Binder, MD;  Location: [facility];  Service: Endoscopy;  Laterality: N/A;  . Esophagogastroduodenoscopy N/A 10/04/2014    Procedure: ESOPHAGOGASTRODUODENOSCOPY (EGD);  Surgeon: Danie Binder, MD;  Location: [facility];  Service: Endoscopy;  Laterality: N/A;  . Laparotomy N/A 10/08/2014    Procedure: Barrie Folk LAPAROTOMY;  Surgeon: Georganna Skeans, MD;  Location: [facility];  Service: General;  Laterality: N/A;  . Partial colectomy N/A 10/08/2014    Procedure: PARTIAL COLECTOMY WITH ILEOSTOMY;  Surgeon: Georganna Skeans, MD;  Location: [facility];  Service: General;  Laterality: N/A;    No current outpatient prescriptions on file.   No current facility-administered medications for this visit.    Allergies:   Celery oil   Social History: History   Social History  . Marital Status: Married    Spouse Name: N/A  . Number of Children: N/A  . Years of Education: N/A   Occupational History  . Truck Geophysicist/field seismologist     Retired   Social History Main Topics  . Smoking status: Former Smoker -- 5.00 packs/day for 25 years    Types: Cigars  . Smokeless tobacco: Current User    Types: Chew  . Alcohol Use: No  . Drug Use: No  . Sexual Activity: Not Currently   Other Topics Concern  .  Not on file   Social History Narrative    Family History: Family History  Problem Relation Age of Onset  . Heart attack Brother     CABG  . Diabetes Brother   . Stroke Father   . Heart Problems Mother   . Colon cancer Neg Hx     Review of Systems: General: No chills, fever, night sweats or weight changes  Cardiovascular:  + Occasional orthostatic intolerance, no chest pain, dyspnea on exertion, edema, orthopnea, palpitations, paroxysmal nocturnal dyspnea Dermatological: No rash, lesions or masses Respiratory: No cough, dyspnea Urologic: No hematuria, dysuria Abdominal: + anorexia, no nausea, vomiting,  diarrhea, bright red blood per rectum, melena, or hematemesis Neurologic: No visual changes, weakness, changes in mental status All other systems reviewed and are otherwise negative except as noted above.   Physical Exam: VS:  BP 96/38 mmHg  Pulse 44 , BMI There is no weight on file to calculate BMI. Wt Readings from Last 3 Encounters:  10/16/14 190 lb 1.6 oz (86.229 kg)  10/02/13 247 lb 6.4 oz (112.22 kg)  09/15/13 273 lb 9.5 oz (124.1 kg)    GEN- The patient is chronically ill appearing, alert and oriented x 3 today.   HEENT: normocephalic, atraumatic; sclera clear, conjunctiva pink; hearing intact; oropharynx clear; neck supple Lymph- no cervical lymphadenopathy Lungs- Clear to ausculation bilaterally, normal work of breathing.  No wheezes, rales, rhonchi Heart- Irregular rate and rhythm, no murmurs, rubs or gallops GI- soft, non-tender, non-distended, bowel sounds present Extremities- no clubbing, cyanosis, or edema; DP/PT/radial pulses 1+ bilaterally MS- no significant deformity or atrophy Skin- warm and dry, no rash or lesion Psych- euthymic mood, full affect Neuro- strength and sensation are intact   EKG:  EKG is ordered today. The ekg ordered today shows atrial fibrillation, rate 77, LPFB, diffuse ST-T changes  Recent Labs: 10/10/2014: ALT 26; B Natriuretic Peptide 155.8* 10/12/2014: Magnesium 2.0 10/19/2014: BUN 71*; Creatinine 3.21*; Hemoglobin 9.0*; Platelets 294; Potassium 4.9; Sodium 132*    Other studies Reviewed: Additional studies/ records that were reviewed today include: hospital records  Assessment and Plan: 1. Chronic systolic heart failure EF 20-25% by echo at recent admission HF medications unable to be added back at this time due to hypotension Euvolemic on exam today. No ischemic symptoms Unable to weigh in clinic due to weakness, but the patient reports stable weight at rehab.  Follow-up in [facility] office 3-4 weeks to reassess HF status   2.   Persistent atrial fibrillation/flutter Continue amiodarone 200mg  bid Anticoagulated with Lovenox - on appropriate dose for CrCl H/H stable on labs 10/19/14 CHADS2VASC score is 5, will need long-term anticoagulation.  Will not be a candidate for NOAC with renal failure Timing of transition to Coumadin per GI/primary team  3.  RUE DVT On Lovenox as above Continue to follow H/H with primary team  4.  CKD, stage III/IV Continue to hold ACE-I/ARB/spironolactone Per PCP  5.  Anemia Per PCP/GI  Patient and EKG reviewed with Dr Rayann Heman today.   Current medicines are reviewed at length with the patient today.   The patient does not have concerns regarding his medicines.  The following changes were made today:  none  Labs/ tests ordered today include: none    Disposition:   FU with Jory Sims, NP/Dr Branch 3 weeks   Signed, Chanetta Marshall, NP 10/25/2014 10:00 AM   [place] [address] [phone] (office) [phone] (fax

## 2014-10-29 ENCOUNTER — Other Ambulatory Visit (HOSPITAL_COMMUNITY)
Admission: RE | Admit: 2014-10-29 | Discharge: 2014-10-29 | Disposition: A | Discharge status: Home / Self Care | Payer: Medicare Other | Source: Other Acute Inpatient Hospital | Attending: Pulmonary Disease | Admitting: Pulmonary Disease

## 2014-10-29 LAB — COMPREHENSIVE METABOLIC PANEL
ALBUMIN: 3.2 g/dL — AB (ref 3.5–5.2)
ALT: 14 U/L (ref 0–53)
AST: 12 U/L (ref 0–37)
Alkaline Phosphatase: 67 U/L (ref 39–117)
Anion gap: 4 — ABNORMAL LOW (ref 5–15)
BILIRUBIN TOTAL: 0.5 mg/dL (ref 0.3–1.2)
BUN: 80 mg/dL — ABNORMAL HIGH (ref 6–23)
CHLORIDE: 116 mmol/L — AB (ref 96–112)
CO2: 15 mmol/L — AB (ref 19–32)
Calcium: 9.1 mg/dL (ref 8.4–10.5)
Creatinine, Ser: 4.78 mg/dL — ABNORMAL HIGH (ref 0.50–1.35)
GFR calc Af Amer: 12 mL/min — ABNORMAL LOW (ref >90)
GFR, EST NON AFRICAN AMERICAN: 10 mL/min — AB (ref >90)
Glucose, Bld: 217 mg/dL — ABNORMAL HIGH (ref 70–99)
Potassium: 7.5 mmol/L (ref 3.5–5.1)
SODIUM: 135 mmol/L (ref 135–145)
Total Protein: 6.5 g/dL (ref 6.0–8.3)

## 2014-10-30 ENCOUNTER — Other Ambulatory Visit (HOSPITAL_COMMUNITY)
Admission: RE | Admit: 2014-10-30 | Discharge: 2014-10-30 | Disposition: A | Discharge status: Home / Self Care | Payer: Medicare Other | Source: Other Acute Inpatient Hospital | Attending: Pulmonary Disease | Admitting: Pulmonary Disease

## 2014-10-31 ENCOUNTER — Other Ambulatory Visit: Payer: Self-pay

## 2014-10-31 ENCOUNTER — Inpatient Hospital Stay (HOSPITAL_COMMUNITY)
Admission: EM | Admit: 2014-10-31 | Discharge: 2014-11-08 | DRG: 683 | Disposition: A | Discharge status: Skilled Nursing Facility | Payer: Medicare Other | Attending: Pulmonary Disease | Admitting: Pulmonary Disease

## 2014-10-31 ENCOUNTER — Encounter (HOSPITAL_COMMUNITY): Payer: Self-pay | Admitting: *Deleted

## 2014-10-31 ENCOUNTER — Emergency Department (HOSPITAL_COMMUNITY): Payer: Medicare Other

## 2014-10-31 ENCOUNTER — Encounter (HOSPITAL_COMMUNITY)
Admission: RE | Admit: 2014-10-31 | Discharge: 2014-10-31 | Disposition: A | Discharge status: Home / Self Care | Payer: Medicare Other | Source: Skilled Nursing Facility | Attending: Pulmonary Disease | Admitting: Pulmonary Disease

## 2014-10-31 ENCOUNTER — Other Ambulatory Visit (HOSPITAL_COMMUNITY): Payer: Self-pay

## 2014-10-31 DIAGNOSIS — Z86718 Personal history of other venous thrombosis and embolism: Secondary | ICD-10-CM | POA: Diagnosis not present

## 2014-10-31 DIAGNOSIS — E119 Type 2 diabetes mellitus without complications: Secondary | ICD-10-CM | POA: Diagnosis present

## 2014-10-31 DIAGNOSIS — G4733 Obstructive sleep apnea (adult) (pediatric): Secondary | ICD-10-CM | POA: Diagnosis present

## 2014-10-31 DIAGNOSIS — N184 Chronic kidney disease, stage 4 (severe): Secondary | ICD-10-CM

## 2014-10-31 DIAGNOSIS — N390 Urinary tract infection, site not specified: Secondary | ICD-10-CM | POA: Diagnosis present

## 2014-10-31 DIAGNOSIS — Z833 Family history of diabetes mellitus: Secondary | ICD-10-CM

## 2014-10-31 DIAGNOSIS — N179 Acute kidney failure, unspecified: Principal | ICD-10-CM

## 2014-10-31 DIAGNOSIS — Z9049 Acquired absence of other specified parts of digestive tract: Secondary | ICD-10-CM | POA: Diagnosis present

## 2014-10-31 DIAGNOSIS — I130 Hypertensive heart and chronic kidney disease with heart failure and stage 1 through stage 4 chronic kidney disease, or unspecified chronic kidney disease: Secondary | ICD-10-CM | POA: Diagnosis present

## 2014-10-31 DIAGNOSIS — R531 Weakness: Secondary | ICD-10-CM | POA: Diagnosis not present

## 2014-10-31 DIAGNOSIS — C189 Malignant neoplasm of colon, unspecified: Secondary | ICD-10-CM | POA: Diagnosis present

## 2014-10-31 DIAGNOSIS — Z823 Family history of stroke: Secondary | ICD-10-CM | POA: Diagnosis not present

## 2014-10-31 DIAGNOSIS — Z8249 Family history of ischemic heart disease and other diseases of the circulatory system: Secondary | ICD-10-CM

## 2014-10-31 DIAGNOSIS — D638 Anemia in other chronic diseases classified elsewhere: Secondary | ICD-10-CM | POA: Diagnosis present

## 2014-10-31 DIAGNOSIS — Z7901 Long term (current) use of anticoagulants: Secondary | ICD-10-CM

## 2014-10-31 DIAGNOSIS — F17220 Nicotine dependence, chewing tobacco, uncomplicated: Secondary | ICD-10-CM | POA: Diagnosis present

## 2014-10-31 DIAGNOSIS — I482 Chronic atrial fibrillation: Secondary | ICD-10-CM | POA: Diagnosis present

## 2014-10-31 DIAGNOSIS — I429 Cardiomyopathy, unspecified: Secondary | ICD-10-CM | POA: Diagnosis present

## 2014-10-31 DIAGNOSIS — E875 Hyperkalemia: Secondary | ICD-10-CM

## 2014-10-31 DIAGNOSIS — N289 Disorder of kidney and ureter, unspecified: Secondary | ICD-10-CM

## 2014-10-31 DIAGNOSIS — Z66 Do not resuscitate: Secondary | ICD-10-CM | POA: Diagnosis present

## 2014-10-31 DIAGNOSIS — E872 Acidosis: Secondary | ICD-10-CM | POA: Diagnosis present

## 2014-10-31 DIAGNOSIS — I5042 Chronic combined systolic (congestive) and diastolic (congestive) heart failure: Secondary | ICD-10-CM | POA: Diagnosis present

## 2014-10-31 DIAGNOSIS — Z933 Colostomy status: Secondary | ICD-10-CM | POA: Diagnosis not present

## 2014-10-31 DIAGNOSIS — B952 Enterococcus as the cause of diseases classified elsewhere: Secondary | ICD-10-CM | POA: Diagnosis present

## 2014-10-31 LAB — CBC WITH DIFFERENTIAL/PLATELET
BASOS ABS: 0 10*3/uL (ref 0.0–0.1)
BASOS PCT: 1 % (ref 0–1)
EOS PCT: 3 % (ref 0–5)
Eosinophils Absolute: 0.2 10*3/uL (ref 0.0–0.7)
HCT: 31 % — ABNORMAL LOW (ref 39.0–52.0)
Hemoglobin: 9.3 g/dL — ABNORMAL LOW (ref 13.0–17.0)
LYMPHS ABS: 2.3 10*3/uL (ref 0.7–4.0)
Lymphocytes Relative: 28 % (ref 12–46)
MCH: 29.1 pg (ref 26.0–34.0)
MCHC: 30 g/dL (ref 30.0–36.0)
MCV: 96.9 fL (ref 78.0–100.0)
Monocytes Absolute: 0.8 10*3/uL (ref 0.1–1.0)
Monocytes Relative: 10 % (ref 3–12)
NEUTROS ABS: 4.9 10*3/uL (ref 1.7–7.7)
Neutrophils Relative %: 58 % (ref 43–77)
Platelets: 282 10*3/uL (ref 150–400)
RBC: 3.2 MIL/uL — ABNORMAL LOW (ref 4.22–5.81)
RDW: 19.6 % — ABNORMAL HIGH (ref 11.5–15.5)
WBC: 8.2 10*3/uL (ref 4.0–10.5)

## 2014-10-31 LAB — BASIC METABOLIC PANEL
ANION GAP: 4 — AB (ref 5–15)
ANION GAP: 3 — AB (ref 5–15)
Anion gap: 4 — ABNORMAL LOW (ref 5–15)
Anion gap: 5 (ref 5–15)
BUN: 79 mg/dL — AB (ref 6–23)
BUN: 79 mg/dL — AB (ref 6–23)
BUN: 78 mg/dL — ABNORMAL HIGH (ref 6–23)
BUN: 75 mg/dL — ABNORMAL HIGH (ref 6–23)
CALCIUM: 8.7 mg/dL (ref 8.4–10.5)
CHLORIDE: 120 mmol/L — AB (ref 96–112)
CO2: 13 mmol/L — ABNORMAL LOW (ref 19–32)
CO2: 12 mmol/L — AB (ref 19–32)
CO2: 15 mmol/L — ABNORMAL LOW (ref 19–32)
CO2: 10 mmol/L — CL (ref 19–32)
CREATININE: 5.05 mg/dL — AB (ref 0.50–1.35)
Calcium: 8.6 mg/dL (ref 8.4–10.5)
Calcium: 8.8 mg/dL (ref 8.4–10.5)
Calcium: 8.6 mg/dL (ref 8.4–10.5)
Chloride: 120 mmol/L — ABNORMAL HIGH (ref 96–112)
Chloride: 121 mmol/L — ABNORMAL HIGH (ref 96–112)
Chloride: 122 mmol/L — ABNORMAL HIGH (ref 96–112)
Creatinine, Ser: 5 mg/dL — ABNORMAL HIGH (ref 0.50–1.35)
Creatinine, Ser: 5.09 mg/dL — ABNORMAL HIGH (ref 0.50–1.35)
Creatinine, Ser: 4.87 mg/dL — ABNORMAL HIGH (ref 0.50–1.35)
GFR calc Af Amer: 11 mL/min — ABNORMAL LOW (ref >90)
GFR calc Af Amer: 11 mL/min — ABNORMAL LOW (ref >90)
GFR calc non Af Amer: 10 mL/min — ABNORMAL LOW (ref >90)
GFR calc non Af Amer: 10 mL/min — ABNORMAL LOW (ref >90)
GFR calc non Af Amer: 10 mL/min — ABNORMAL LOW (ref >90)
GFR, EST AFRICAN AMERICAN: 11 mL/min — AB (ref >90)
GFR, EST AFRICAN AMERICAN: 12 mL/min — AB (ref >90)
GFR, EST NON AFRICAN AMERICAN: 10 mL/min — AB (ref >90)
GLUCOSE: 170 mg/dL — AB (ref 70–99)
GLUCOSE: 123 mg/dL — AB (ref 70–99)
Glucose, Bld: 195 mg/dL — ABNORMAL HIGH (ref 70–99)
Glucose, Bld: 44 mg/dL — CL (ref 70–99)
POTASSIUM: 7.1 mmol/L — AB (ref 3.5–5.1)
POTASSIUM: 6.5 mmol/L — AB (ref 3.5–5.1)
POTASSIUM: 7.1 mmol/L — AB (ref 3.5–5.1)
SODIUM: 137 mmol/L (ref 135–145)
SODIUM: 136 mmol/L (ref 135–145)
Sodium: 139 mmol/L (ref 135–145)
Sodium: 137 mmol/L (ref 135–145)

## 2014-10-31 LAB — URINALYSIS, ROUTINE W REFLEX MICROSCOPIC
BILIRUBIN URINE: NEGATIVE
GLUCOSE, UA: NEGATIVE mg/dL
KETONES UR: NEGATIVE mg/dL
Nitrite: NEGATIVE
PH: 6 (ref 5.0–8.0)
PROTEIN: 100 mg/dL — AB
SPECIFIC GRAVITY, URINE: 1.025 (ref 1.005–1.030)
Urobilinogen, UA: 0.2 mg/dL (ref 0.0–1.0)

## 2014-10-31 LAB — COMPREHENSIVE METABOLIC PANEL
ALT: 14 U/L (ref 0–53)
AST: 12 U/L (ref 0–37)
Albumin: 2.9 g/dL — ABNORMAL LOW (ref 3.5–5.2)
Alkaline Phosphatase: 63 U/L (ref 39–117)
Anion gap: 3 — ABNORMAL LOW (ref 5–15)
BILIRUBIN TOTAL: 0.4 mg/dL (ref 0.3–1.2)
BUN: 78 mg/dL — ABNORMAL HIGH (ref 6–23)
CALCIUM: 8.7 mg/dL (ref 8.4–10.5)
CO2: 14 mmol/L — AB (ref 19–32)
CREATININE: 5.11 mg/dL — AB (ref 0.50–1.35)
Chloride: 119 mmol/L — ABNORMAL HIGH (ref 96–112)
GFR calc Af Amer: 11 mL/min — ABNORMAL LOW (ref >90)
GFR calc non Af Amer: 10 mL/min — ABNORMAL LOW (ref >90)
Glucose, Bld: 165 mg/dL — ABNORMAL HIGH (ref 70–99)
POTASSIUM: 7 mmol/L — AB (ref 3.5–5.1)
Sodium: 136 mmol/L (ref 135–145)
TOTAL PROTEIN: 6.4 g/dL (ref 6.0–8.3)

## 2014-10-31 LAB — LACTIC ACID, PLASMA
LACTIC ACID, VENOUS: 1.1 mmol/L (ref 0.5–2.0)
Lactic Acid, Venous: 1.1 mmol/L (ref 0.5–2.0)

## 2014-10-31 LAB — TSH: TSH: 7.046 u[IU]/mL — ABNORMAL HIGH (ref 0.350–4.500)

## 2014-10-31 LAB — URINE MICROSCOPIC-ADD ON

## 2014-10-31 LAB — CBG MONITORING, ED: GLUCOSE-CAPILLARY: 170 mg/dL — AB (ref 70–99)

## 2014-10-31 MED ORDER — CETYLPYRIDINIUM CHLORIDE 0.05 % MT LIQD
7.0000 mL | Freq: Two times a day (BID) | OROMUCOSAL | Status: DC
  Administered 2014-10-31 – 2014-11-08 (×16): 7 mL via OROMUCOSAL

## 2014-10-31 MED ORDER — SODIUM POLYSTYRENE SULFONATE 15 GM/60ML PO SUSP
30.0000 g | Freq: Once | ORAL | Status: AC
  Administered 2014-10-31: 30 g via ORAL
  Filled 2014-10-31: qty 120

## 2014-10-31 MED ORDER — SODIUM BICARBONATE 8.4 % IV SOLN
25.0000 meq | Freq: Once | INTRAVENOUS | Status: AC
  Administered 2014-10-31: 25 meq via INTRAVENOUS
  Filled 2014-10-31: qty 50

## 2014-10-31 MED ORDER — SODIUM CHLORIDE 0.9 % IV BOLUS (SEPSIS)
500.0000 mL | Freq: Once | INTRAVENOUS | Status: AC
  Administered 2014-10-31: 500 mL via INTRAVENOUS

## 2014-10-31 MED ORDER — SODIUM BICARBONATE 8.4 % IV SOLN
INTRAVENOUS | Status: DC
  Administered 2014-11-01: 01:00:00 via INTRAVENOUS
  Filled 2014-10-31 (×2): qty 150

## 2014-10-31 MED ORDER — INSULIN ASPART 100 UNIT/ML ~~LOC~~ SOLN
0.0000 [IU] | Freq: Every day | SUBCUTANEOUS | Status: DC
  Administered 2014-11-02: 3 [IU] via SUBCUTANEOUS

## 2014-10-31 MED ORDER — ONDANSETRON HCL 4 MG PO TABS: 4.0000 mg | ORAL_TABLET | Freq: Four times a day (QID) | ORAL | Status: DC | PRN

## 2014-10-31 MED ORDER — DEXTROSE 50 % IV SOLN
INTRAVENOUS | Status: AC
  Filled 2014-10-31: qty 50

## 2014-10-31 MED ORDER — ADULT MULTIVITAMIN W/MINERALS CH
1.0000 | ORAL_TABLET | Freq: Every day | ORAL | Status: DC
  Administered 2014-10-31 – 2014-11-08 (×9): 1 via ORAL
  Filled 2014-10-31 (×9): qty 1

## 2014-10-31 MED ORDER — INSULIN ASPART 100 UNIT/ML ~~LOC~~ SOLN
0.0000 [IU] | Freq: Three times a day (TID) | SUBCUTANEOUS | Status: DC
  Administered 2014-11-01 (×2): 1 [IU] via SUBCUTANEOUS
  Administered 2014-11-02 (×2): 2 [IU] via SUBCUTANEOUS
  Administered 2014-11-03 (×2): 1 [IU] via SUBCUTANEOUS
  Administered 2014-11-04: 2 [IU] via SUBCUTANEOUS
  Administered 2014-11-04 – 2014-11-05 (×2): 1 [IU] via SUBCUTANEOUS
  Administered 2014-11-06: 2 [IU] via SUBCUTANEOUS
  Administered 2014-11-06: 1 [IU] via SUBCUTANEOUS
  Administered 2014-11-07 – 2014-11-08 (×2): 2 [IU] via SUBCUTANEOUS

## 2014-10-31 MED ORDER — PANTOPRAZOLE SODIUM 40 MG PO TBEC
40.0000 mg | DELAYED_RELEASE_TABLET | Freq: Every day | ORAL | Status: DC
  Administered 2014-11-01 – 2014-11-08 (×8): 40 mg via ORAL
  Filled 2014-10-31 (×8): qty 1

## 2014-10-31 MED ORDER — AMIODARONE HCL 200 MG PO TABS
200.0000 mg | ORAL_TABLET | Freq: Two times a day (BID) | ORAL | Status: DC
  Administered 2014-10-31 – 2014-11-03 (×7): 200 mg via ORAL
  Filled 2014-10-31 (×7): qty 1

## 2014-10-31 MED ORDER — ATORVASTATIN CALCIUM 20 MG PO TABS
20.0000 mg | ORAL_TABLET | Freq: Every day | ORAL | Status: DC
  Administered 2014-10-31 – 2014-11-07 (×8): 20 mg via ORAL
  Filled 2014-10-31 (×8): qty 1

## 2014-10-31 MED ORDER — DEXTROSE 50 % IV SOLN
0.5000 | Freq: Once | INTRAVENOUS | Status: AC
  Administered 2014-10-31: 25 mL via INTRAVENOUS

## 2014-10-31 MED ORDER — INSULIN ASPART 100 UNIT/ML ~~LOC~~ SOLN
3.0000 [IU] | Freq: Three times a day (TID) | SUBCUTANEOUS | Status: DC
  Administered 2014-11-01 – 2014-11-08 (×19): 3 [IU] via SUBCUTANEOUS

## 2014-10-31 MED ORDER — SODIUM CHLORIDE 0.9 % IV SOLN: INTRAVENOUS | Status: AC

## 2014-10-31 MED ORDER — SODIUM BICARBONATE 8.4 % IV SOLN
INTRAVENOUS | Status: AC
  Filled 2014-10-31: qty 50

## 2014-10-31 MED ORDER — ENOXAPARIN SODIUM 100 MG/ML ~~LOC~~ SOLN
85.0000 mg | SUBCUTANEOUS | Status: DC
  Administered 2014-11-01 – 2014-11-03 (×3): 85 mg via SUBCUTANEOUS
  Filled 2014-10-31 (×4): qty 1

## 2014-10-31 MED ORDER — DEXTROSE 50 % IV SOLN
1.0000 | INTRAVENOUS | Status: AC
  Administered 2014-10-31: 50 mL via INTRAVENOUS

## 2014-10-31 MED ORDER — CALCIUM GLUCONATE 10 % IV SOLN
1.0000 g | Freq: Once | INTRAVENOUS | Status: AC
  Administered 2014-10-31: 1 g via INTRAVENOUS
  Filled 2014-10-31: qty 10

## 2014-10-31 MED ORDER — SERTRALINE HCL 50 MG PO TABS
50.0000 mg | ORAL_TABLET | Freq: Every day | ORAL | Status: DC
  Administered 2014-11-01 – 2014-11-08 (×8): 50 mg via ORAL
  Filled 2014-10-31 (×8): qty 1

## 2014-10-31 MED ORDER — LEVOTHYROXINE SODIUM 25 MCG PO TABS
125.0000 ug | ORAL_TABLET | Freq: Every day | ORAL | Status: DC
  Administered 2014-11-01 – 2014-11-08 (×8): 125 ug via ORAL
  Filled 2014-10-31 (×16): qty 1

## 2014-10-31 MED ORDER — CENTRUM SILVER PO TABS: ORAL_TABLET | Freq: Every day | ORAL | Status: DC

## 2014-10-31 MED ORDER — DEXTROSE 5 % IV SOLN
1.0000 g | Freq: Once | INTRAVENOUS | Status: AC
  Administered 2014-10-31: 1 g via INTRAVENOUS
  Filled 2014-10-31: qty 10

## 2014-10-31 MED ORDER — SODIUM POLYSTYRENE SULFONATE 15 GM/60ML PO SUSP
30.0000 g | Freq: Once | ORAL | Status: AC
  Administered 2014-11-01: 30 g via ORAL
  Filled 2014-10-31: qty 120

## 2014-10-31 MED ORDER — SENNOSIDES-DOCUSATE SODIUM 8.6-50 MG PO TABS: 1.0000 | ORAL_TABLET | Freq: Every evening | ORAL | Status: DC | PRN

## 2014-10-31 MED ORDER — DEXTROSE 50 % IV SOLN
1.0000 | Freq: Once | INTRAVENOUS | Status: AC
  Administered 2014-10-31: 50 mL via INTRAVENOUS

## 2014-10-31 MED ORDER — INSULIN ASPART 100 UNIT/ML ~~LOC~~ SOLN
10.0000 [IU] | Freq: Once | SUBCUTANEOUS | Status: AC
  Administered 2014-10-31: 10 [IU] via SUBCUTANEOUS
  Filled 2014-10-31: qty 1

## 2014-10-31 MED ORDER — ACETAMINOPHEN 650 MG RE SUPP: 650.0000 mg | Freq: Four times a day (QID) | RECTAL | Status: DC | PRN

## 2014-10-31 MED ORDER — ACETAMINOPHEN 325 MG PO TABS: 650.0000 mg | ORAL_TABLET | Freq: Four times a day (QID) | ORAL | Status: DC | PRN

## 2014-10-31 MED ORDER — ONDANSETRON HCL 4 MG/2ML IJ SOLN: 4.0000 mg | Freq: Four times a day (QID) | INTRAMUSCULAR | Status: DC | PRN

## 2014-10-31 MED ORDER — INSULIN ASPART 100 UNIT/ML IV SOLN
10.0000 [IU] | Freq: Once | INTRAVENOUS | Status: AC
  Administered 2014-11-01: 10 [IU] via INTRAVENOUS

## 2014-10-31 MED ORDER — SODIUM CHLORIDE 0.9 % IV SOLN
INTRAVENOUS | Status: DC
  Administered 2014-10-31: 18:00:00 via INTRAVENOUS

## 2014-10-31 MED ORDER — SODIUM CHLORIDE 0.9 % IV SOLN
1.0000 g | Freq: Once | INTRAVENOUS | Status: AC
  Administered 2014-10-31: 1 g via INTRAVENOUS
  Filled 2014-10-31: qty 10

## 2014-10-31 MED ORDER — HYDROGEN PEROXIDE 3 % EX SOLN
CUTANEOUS | Status: AC
  Filled 2014-10-31: qty 473

## 2014-10-31 NOTE — H&P (Signed)
[facility]          History and Physical    PCP:   [name], MD   Chief Complaint:  Weakness  HPI: Patient is an 81 year old gentleman with multiple medical comorbidities including diabetes, stage IV chronic kidney disease with baseline creatinine around 2.3, hypertension, obstructive sleep apnea, combined CHF with an ejection fraction of 20-25%. He was recently admitted to the hospital at the end of February secondary to acute on chronic respiratory failure secondary to acute combined CHF, he developed new onset A. fib. He was seen by cardiology and required a dobutamine drip in addition to diuretics. He was also found to have a right upper extremity DVT and required a heparin drip and was discharged on therapeutic Lovenox dosing. Because of his transfusion dependent anemia, GI was consulted and EGD and colonoscopies were performed. Colonoscopy revealed a right colonic mass and he subsequently underwent partial colectomy with ileostomy with pathology consistent with adenocarcinoma of the colon. He was subsequently sent to [place] [facility]. He states he has had no appetite and has not been eating or drinking very much over the past 5 days. Today was in physical therapy and he was so weak that he suffered a fall and was subsequently transferred to the hospital for evaluation. Workup in the emergency department is significant for a creatinine of 5.11, a potassium of 7.1. He has been given calcium gluconate, bicarbonate, kayexalate, insulin with D50 in the ED. We were consulted for admission.  Allergies:   Allergies  Allergen Reactions  . Adhesive [Tape] Other (See Comments)    Pulls skin off.   Antoine Poche Oil Nausea And Vomiting      Past Medical History  Diagnosis Date  . Type 2 diabetes mellitus   . Anemia of chronic disease   . CKD (chronic kidney disease) stage 3, GFR 30-59 ml/min   . Herpes zoster   . Obesity   . Exposure to hazardous  substance   . Intermittent Leukopenia   . Essential hypertension   . Sleep apnea     Sleep Apnea score of 7  . Cardiomyopathy     LVEF 40-45% January 2015  . Left bundle branch block     Past Surgical History  Procedure Laterality Date  . Nasal polyp surgery    . Ventral hernia repair N/A 09/14/2013    Procedure: HERNIA REPAIR VENTRAL ADULT WITH MESH;  Surgeon: Jamesetta So, MD;  Location: [facility];  Service: General;  Laterality: N/A;  . Insertion of mesh N/A 09/14/2013    Procedure: INSERTION OF MESH;  Surgeon: Jamesetta So, MD;  Location: [facility];  Service: General;  Laterality: N/A;  . Colonoscopy N/A 10/04/2014    Procedure: COLONOSCOPY;  Surgeon: Danie Binder, MD;  [facility];  Service: Endoscopy;  Laterality: N/A;  . Esophagogastroduodenoscopy N/A 10/04/2014    Procedure: ESOPHAGOGASTRODUODENOSCOPY (EGD);  Surgeon: Danie Binder, MD;  [facility];  Service: Endoscopy;  Laterality: N/A;  . Laparotomy N/A 10/08/2014    Procedure: Barrie Folk LAPAROTOMY;  Surgeon: Georganna Skeans, MD;  Location: [facility];  Service: General;  Laterality: N/A;  . Partial colectomy N/A 10/08/2014    Procedure: PARTIAL COLECTOMY WITH ILEOSTOMY;  Surgeon: Georganna Skeans, MD;  Location: [place];  Service: General;  Laterality: N/A;    Prior to Admission medications   Medication Sig Start Date End Date Taking?  Authorizing Provider  amiodarone (PACERONE) 200 MG tablet Take 1 tablet (200 mg total) by mouth 2 (two) times daily. 10/16/14  Yes Kinnie Feil, MD  atorvastatin (LIPITOR) 20 MG tablet Take 20 mg by mouth daily.   Yes Historical Provider, MD  Janne Lab Oil [facility]) OINT Apply 1 application topically 3 (three) times daily.   Yes Historical Provider, MD  enoxaparin (LOVENOX) 100 MG/ML injection Inject 0.95 mLs (95 mg total) into the skin daily. 10/16/14  Yes Kinnie Feil, MD  levothyroxine (SYNTHROID, LEVOTHROID) 125 MCG tablet Take 125 mcg by mouth daily before  breakfast.   Yes Historical Provider, MD  Multiple Vitamins-Minerals (CENTRUM SILVER PO) Take 1 tablet by mouth daily.   Yes Historical Provider, MD  mupirocin ointment (BACTROBAN) 2 % Place 1 application into the nose 2 (two) times daily.   Yes Historical Provider, MD  omeprazole (PRILOSEC) 20 MG capsule Take 20 mg by mouth daily.   Yes Historical Provider, MD  sertraline (ZOLOFT) 50 MG tablet Take 50 mg by mouth daily.   Yes Historical Provider, MD  acetaminophen (TYLENOL) 325 MG tablet Take 1-2 tablets (325-650 mg total) by mouth every 6 (six) hours as needed for fever, headache, mild pain or moderate pain. 10/16/14   Kinnie Feil, MD  loperamide (IMODIUM) 2 MG capsule Take 1 capsule (2 mg total) by mouth as needed for diarrhea or loose stools. 10/16/14   Kinnie Feil, MD    Social History:  reports that he has quit smoking. His smoking use included Cigars. His smokeless tobacco use includes Chew. He reports that he does not drink alcohol or use illicit drugs.  Family History  Problem Relation Age of Onset  . Heart attack Brother     CABG  . Diabetes Brother   . Stroke Father   . Heart Problems Mother   . Colon cancer Neg Hx     Review of Systems:  Constitutional: Denies fever, chills, diaphoresis, positive for appetite change and fatigue.  HEENT: Denies photophobia, eye pain, redness, hearing loss, ear pain, congestion, sore throat, rhinorrhea, sneezing, mouth sores, trouble swallowing, neck pain, neck stiffness and tinnitus.   Respiratory: Denies SOB, DOE, cough, chest tightness,  and wheezing.   Cardiovascular: Denies chest pain, palpitations and leg swelling.  Gastrointestinal: Denies nausea, vomiting, abdominal pain, diarrhea, constipation, blood in stool and abdominal distention.  Genitourinary: Denies dysuria, urgency, frequency, hematuria, flank pain and difficulty urinating.  Endocrine: Denies: hot or cold intolerance, sweats, changes in hair or nails, polyuria,  polydipsia. Musculoskeletal: Denies myalgias, back pain, joint swelling, arthralgias and gait problem.  Skin: Denies pallor, rash and wound.  Neurological: Denies dizziness, seizures, syncope, , light-headedness, numbness and headaches.  Hematological: Denies adenopathy. Easy bruising, personal or family bleeding history  Psychiatric/Behavioral: Denies suicidal ideation, mood changes, confusion, nervousness, sleep disturbance and agitation   Physical Exam: Blood pressure 92/54, pulse 83, temperature 98.3 F (36.8 C), resp. rate 18, height 5' 11" (1.803 m), weight 83.915 kg (185 lb), SpO2 97 %. General: Alert, awake, oriented 3, pleasant and cooperative with exam, HEENT: Normocephalic, atraumatic, pupils equal round and reactive, extraocular movements intact, very dry mucous membranes with cracked lips and tongue, mild pharyngeal erythema. Neck: Supple, no JVD, no lymphadenopathy, no bruits, no goiter. Cardiovascular: Regular rate and rhythm Lungs: Clear to auscultation bilaterally Abdomen: Soft, nontender, nondistended, positive bowel sounds, ileostomy in place with moderate stool output. Extremities: Trace bilateral pitting edema. Neurologic: Grossly intact and nonfocal, generally weak, had not seen  him ambulating.  Labs on Admission:  Results for orders placed or performed during the hospital encounter of 10/31/14 (from the past 48 hour(s))  CBG monitoring, ED     Status: Abnormal   Collection Time: 10/31/14 12:05 PM  Result Value Ref Range   Glucose-Capillary 170 (H) 70 - 99 mg/dL  CBC with Differential/Platelet     Status: Abnormal   Collection Time: 10/31/14 12:41 PM  Result Value Ref Range   WBC 8.2 4.0 - 10.5 K/uL   RBC 3.20 (L) 4.22 - 5.81 MIL/uL   Hemoglobin 9.3 (L) 13.0 - 17.0 g/dL   HCT 31.0 (L) 39.0 - 52.0 %   MCV 96.9 78.0 - 100.0 fL   MCH 29.1 26.0 - 34.0 pg   MCHC 30.0 30.0 - 36.0 g/dL   RDW 19.6 (H) 11.5 - 15.5 %   Platelets 282 150 - 400 K/uL   Neutrophils  Relative % 58 43 - 77 %   Neutro Abs 4.9 1.7 - 7.7 K/uL   Lymphocytes Relative 28 12 - 46 %   Lymphs Abs 2.3 0.7 - 4.0 K/uL   Monocytes Relative 10 3 - 12 %   Monocytes Absolute 0.8 0.1 - 1.0 K/uL   Eosinophils Relative 3 0 - 5 %   Eosinophils Absolute 0.2 0.0 - 0.7 K/uL   Basophils Relative 1 0 - 1 %   Basophils Absolute 0.0 0.0 - 0.1 K/uL  Comprehensive metabolic panel     Status: Abnormal   Collection Time: 10/31/14 12:41 PM  Result Value Ref Range   Sodium 136 135 - 145 mmol/L   Potassium 7.0 (HH) 3.5 - 5.1 mmol/L    Comment: REPEATED TO VERIFY CRITICAL RESULT CALLED TO, READ BACK BY AND VERIFIED WITH: POINTSDEXTER,M AT 1325 ON 10/31/14 BY MOSLEYJ    Chloride 119 (H) 96 - 112 mmol/L   CO2 14 (L) 19 - 32 mmol/L   Glucose, Bld 165 (H) 70 - 99 mg/dL   BUN 78 (H) 6 - 23 mg/dL   Creatinine, Ser 5.11 (H) 0.50 - 1.35 mg/dL   Calcium 8.7 8.4 - 10.5 mg/dL   Total Protein 6.4 6.0 - 8.3 g/dL   Albumin 2.9 (L) 3.5 - 5.2 g/dL   AST 12 0 - 37 U/L   ALT 14 0 - 53 U/L   Alkaline Phosphatase 63 39 - 117 U/L   Total Bilirubin 0.4 0.3 - 1.2 mg/dL   GFR calc non Af Amer 10 (L) >90 mL/min   GFR calc Af Amer 11 (L) >90 mL/min    Comment: (NOTE) The eGFR has been calculated using the CKD EPI equation. This calculation has not been validated in all clinical situations. eGFR's persistently <90 mL/min signify possible Chronic Kidney Disease.    Anion gap 3 (L) 5 - 15  Urinalysis, Routine w reflex microscopic     Status: Abnormal   Collection Time: 10/31/14  1:20 PM  Result Value Ref Range   Color, Urine YELLOW YELLOW   APPearance CLOUDY (A) CLEAR   Specific Gravity, Urine 1.025 1.005 - 1.030   pH 6.0 5.0 - 8.0   Glucose, UA NEGATIVE NEGATIVE mg/dL   Hgb urine dipstick LARGE (A) NEGATIVE   Bilirubin Urine NEGATIVE NEGATIVE   Ketones, ur NEGATIVE NEGATIVE mg/dL   Protein, ur 100 (A) NEGATIVE mg/dL   Urobilinogen, UA 0.2 0.0 - 1.0 mg/dL   Nitrite NEGATIVE NEGATIVE   Leukocytes, UA  MODERATE (A) NEGATIVE  Urine microscopic-add on  Status: Abnormal   Collection Time: 10/31/14  1:20 PM  Result Value Ref Range   Squamous Epithelial / LPF RARE RARE   WBC, UA TOO NUMEROUS TO COUNT <3 WBC/hpf   RBC / HPF 11-20 <3 RBC/hpf   Bacteria, UA MANY (A) RARE  Lactic acid, plasma     Status: None   Collection Time: 10/31/14  2:29 PM  Result Value Ref Range   Lactic Acid, Venous 1.1 0.5 - 2.0 mmol/L    Radiological Exams on Admission: Dg Chest Port 1 View  10/31/2014   CLINICAL DATA:  81 year old male presenting with hyperkalemia. Recent history of colonic surgery.  EXAM: PORTABLE CHEST - 1 VIEW  COMPARISON:  Chest x-ray 10/10/2014.  FINDINGS: Lung volumes are slightly low. Film is slightly underpenetrated, limiting the diagnostic sensitivity and specificity of this examination. With these limitations in mind, there is no definite acute consolidative airspace disease, and no pleural effusions. No evidence of pulmonary edema. Heart size is mildly enlarged. The patient is rotated to the left on today's exam, resulting in distortion of the mediastinal contours and reduced diagnostic sensitivity and specificity for mediastinal pathology.  IMPRESSION: 1. Low lung volumes without radiographic evidence of acute cardiopulmonary disease.   Electronically Signed   By: Vinnie Langton M.D.   On: 10/31/2014 13:28    Assessment/Plan Principal Problem:   Hyperkalemia Active Problems:   ARF (acute renal failure)   CKD (chronic kidney disease) stage 4, GFR 15-29 ml/min   Chronic combined systolic and diastolic CHF (congestive heart failure)   Colon cancer    Generalized weakness  -Likely related to progressive renal failure and hyperkalemia. -Check TSH, B-12, treat above.  Acute on chronic kidney disease stage IV  -He does currently appear volume depleted on exam. -We'll give some saline mindful of his systolic CHF. -Recheck be met now.  Hyperkalemia -Potassium 7.1. -No EKG  changes. -Given calcium gluconate, bicarbonate, Kayexalate, insulin with D50 in the emergency department. -We will recheck potassium now to see if further treatment is required. -If kidney failure worsens despite IV fluids and potassium continues to rise will need nephrology consult for potential hemodialysis.  Chronic combined systolic and diastolic CHF  -Known ejection fraction of 20-25%.  -Actually appears volume depleted. -Gentle diuresis given above.  Colon cancer -Follow-up with oncology as previously schedule as an outpatient.  Diabetes -Check hemoglobin A1c. -Sliding-scale insulin while in the hospital.  Recent upper extremity DVT  -Continue full dose anticoagulation with Lovenox.  CODE STATUS -Full code. -Believe this needs to be further addressed with patient and wife in the near future.   Dr. Luan Pulling to assume care in the morning.   Time Spent on Admission: 85 minutes  [facility] Pager: [phone] 10/31/2014, 4:53 PM

## 2014-10-31 NOTE — ED Notes (Signed)
Pt is here from the [facility]. Nurse stated potassium of 7.5  On 3/11. He was treated with kayexolate 60g x 1 at that time. Pt also had 2 liters of saline at 182ml/h, per report. Todays labs:  Potassium 7.1 BUN 79 CRE 5.09  Nurse also states increased confusion.

## 2014-10-31 NOTE — ED Provider Notes (Addendum)
CSN: [id number]     Arrival date & time 10/31/14  1133 History  This chart was scribed for Nat Christen, MD by Stephania Fragmin, ED Scribe. This patient was seen in room [facility] and the patient's care was started at 12:06 PM.    Chief Complaint  Patient presents with  . Abnormal Lab   The history is provided by the spouse. No language interpreter was used.    LEVEL 5 CAVEAT DUE TO AMS  HPI Comments: Greg [name] is a 81 y.o. male S/P right sided colectomy with ileostomy done 09/2014 by Dr. Georganna Skeans for adenocarcinoma the colon who presents to the Emergency Department for a high potassium level (at 7.1, per chart review) and generalized malaise. His wife states that he is confused today. She states he has been urinating a small amount, which is very cloudy. She adds that he has not been eating well. Patient used to haul petroleum as a Administrator.   Past Medical History  Diagnosis Date  . Type 2 diabetes mellitus   . Anemia of chronic disease   . CKD (chronic kidney disease) stage 3, GFR 30-59 ml/min   . Herpes zoster   . Obesity   . Exposure to hazardous substance   . Intermittent Leukopenia   . Essential hypertension   . Sleep apnea     Sleep Apnea score of 7  . Cardiomyopathy     LVEF 40-45% January 2015  . Left bundle branch block    Past Surgical History  Procedure Laterality Date  . Nasal polyp surgery    . Ventral hernia repair N/A 09/14/2013    Procedure: HERNIA REPAIR VENTRAL ADULT WITH MESH;  Surgeon: Jamesetta So, MD;  Location: [facility];  Service: General;  Laterality: N/A;  . Insertion of mesh N/A 09/14/2013    Procedure: INSERTION OF MESH;  Surgeon: Jamesetta So, MD;  Location: [facility];  Service: General;  Laterality: N/A;  . Colonoscopy N/A 10/04/2014    Procedure: COLONOSCOPY;  Surgeon: Danie Binder, MD;  [facility];  Service: Endoscopy;  Laterality: N/A;  . Esophagogastroduodenoscopy N/A 10/04/2014    Procedure: ESOPHAGOGASTRODUODENOSCOPY (EGD);   Surgeon: Danie Binder, MD;  [facility];  Service: Endoscopy;  Laterality: N/A;  . Laparotomy N/A 10/08/2014    Procedure: Barrie Folk LAPAROTOMY;  Surgeon: Georganna Skeans, MD;  Location: [place];  Service: General;  Laterality: N/A;  . Partial colectomy N/A 10/08/2014    Procedure: PARTIAL COLECTOMY WITH ILEOSTOMY;  Surgeon: Georganna Skeans, MD;  Location: [facility];  Service: General;  Laterality: N/A;   Family History  Problem Relation Age of Onset  . Heart attack Brother     CABG  . Diabetes Brother   . Stroke Father   . Heart Problems Mother   . Colon cancer Neg Hx    History  Substance Use Topics  . Smoking status: Former Smoker -- 5.00 packs/day for 25 years    Types: Cigars  . Smokeless tobacco: Current User    Types: Chew  . Alcohol Use: No    Review of Systems  Unable to perform ROS: Mental status change      Allergies  Celery oil  Home Medications   Prior to Admission medications   Medication Sig Start Date End Date Taking? Authorizing Provider  acetaminophen (TYLENOL) 325 MG tablet Take 1-2 tablets (325-650 mg total) by mouth every 6 (six) hours as needed for fever, headache, mild pain or moderate pain.  10/16/14   Kinnie Feil, MD  amiodarone (PACERONE) 200 MG tablet Take 1 tablet (200 mg total) by mouth 2 (two) times daily. 10/16/14   Kinnie Feil, MD  atorvastatin (LIPITOR) 20 MG tablet Take 20 mg by mouth daily.    Historical Provider, MD  enoxaparin (LOVENOX) 100 MG/ML injection Inject 0.95 mLs (95 mg total) into the skin daily. 10/16/14   Kinnie Feil, MD  levothyroxine (SYNTHROID, LEVOTHROID) 125 MCG tablet Take 125 mcg by mouth daily before breakfast.    Historical Provider, MD  loperamide (IMODIUM) 2 MG capsule Take 1 capsule (2 mg total) by mouth as needed for diarrhea or loose stools. 10/16/14   Kinnie Feil, MD  Multiple Vitamins-Minerals (CENTRUM SILVER PO) Take 1 tablet by mouth daily.    Historical Provider, MD  omeprazole  (PRILOSEC) 20 MG capsule Take 20 mg by mouth daily.    Historical Provider, MD  sertraline (ZOLOFT) 50 MG tablet Take 50 mg by mouth daily.    Historical Provider, MD   BP 104/76 mmHg  Pulse 67  Temp(Src) 98.5 F (36.9 C)  Resp 18  Ht 5\' 11"  (1.803 m)  Wt 185 lb (83.915 kg)  BMI 25.81 kg/m2  SpO2 100% Physical Exam  Constitutional:  Answers questions appropriately, but debilitated appearing and sluggish  HENT:  Head: Normocephalic and atraumatic.  Eyes: Conjunctivae and EOM are normal. Pupils are equal, round, and reactive to light.  Neck: Normal range of motion. Neck supple.  Cardiovascular: Normal rate and regular rhythm.   Pulmonary/Chest: Effort normal and breath sounds normal.  Abdominal: Soft. Bowel sounds are normal.  Musculoskeletal: Normal range of motion.  Neurological:  Moves arms and legs.  Skin: Skin is warm and dry.  Psychiatric:  Flat affect  Nursing note and vitals reviewed.   ED Course  Procedures (including critical care time)  DIAGNOSTIC STUDIES: Oxygen Saturation is 100% on room air, normal by my interpretation.    COORDINATION OF CARE: 12:15 PM - Discussed treatment plan with pt at bedside which includes labs, tx for hyperkalemia and pt agreed to plan.   Labs Review Labs Reviewed  CBC WITH DIFFERENTIAL/PLATELET - Abnormal; Notable for the following:    RBC 3.20 (*)    Hemoglobin 9.3 (*)    HCT 31.0 (*)    RDW 19.6 (*)    All other components within normal limits  COMPREHENSIVE METABOLIC PANEL - Abnormal; Notable for the following:    Potassium 7.0 (*)    Chloride 119 (*)    CO2 14 (*)    Glucose, Bld 165 (*)    BUN 78 (*)    Creatinine, Ser 5.11 (*)    Albumin 2.9 (*)    GFR calc non Af Amer 10 (*)    GFR calc Af Amer 11 (*)    Anion gap 3 (*)    All other components within normal limits  URINALYSIS, ROUTINE W REFLEX MICROSCOPIC - Abnormal; Notable for the following:    APPearance CLOUDY (*)    Hgb urine dipstick LARGE (*)     Protein, ur 100 (*)    Leukocytes, UA MODERATE (*)    All other components within normal limits  URINE MICROSCOPIC-ADD ON - Abnormal; Notable for the following:    Bacteria, UA MANY (*)    All other components within normal limits  CBG MONITORING, ED - Abnormal; Notable for the following:    Glucose-Capillary 170 (*)    All other components within normal limits  URINE CULTURE  LACTIC ACID, PLASMA  LACTIC ACID, PLASMA    Imaging Review Dg Chest Port 1 View  10/31/2014   CLINICAL DATA:  81 year old male presenting with hyperkalemia. Recent history of colonic surgery.  EXAM: PORTABLE CHEST - 1 VIEW  COMPARISON:  Chest x-ray 10/10/2014.  FINDINGS: Lung volumes are slightly low. Film is slightly underpenetrated, limiting the diagnostic sensitivity and specificity of this examination. With these limitations in mind, there is no definite acute consolidative airspace disease, and no pleural effusions. No evidence of pulmonary edema. Heart size is mildly enlarged. The patient is rotated to the left on today's exam, resulting in distortion of the mediastinal contours and reduced diagnostic sensitivity and specificity for mediastinal pathology.  IMPRESSION: 1. Low lung volumes without radiographic evidence of acute cardiopulmonary disease.   Electronically Signed   By: Vinnie Langton M.D.   On: 10/31/2014 13:28     EKG Interpretation None      Date: 10/31/2014  Rate: 93  Rhythm: normal sinus rhythm  QRS Axis: normal  Intervals: normal  ST/T Wave abnormalities: normal  Conduction Disutrbances: prolonged QT  Narrative Interpretation: unremarkable  CRITICAL CARE Performed by: Nat Christen Total critical care time: 30 Critical care time was exclusive of separately billable procedures and treating other patients. Critical care was necessary to treat or prevent imminent or life-threatening deterioration. Critical care was time spent personally by me on the following activities: development of  treatment plan with patient and/or surrogate as well as nursing, discussions with consultants, evaluation of patient's response to treatment, examination of patient, obtaining history from patient or surrogate, ordering and performing treatments and interventions, ordering and review of laboratory studies, ordering and review of radiographic studies, pulse oximetry and re-evaluation of patient's condition.  MDM   Final diagnoses:  Hyperkalemia  UTI (lower urinary tract infection)  Renal insufficiency  Patient has several medical issues including hyperkalemia, renal insufficiency, urinary tract infection.  Additionally he has an ileostom secondary to right colectomy in February 2016 for adenocarcinoma the colon.   I suspect his impaired renal function is contributing to the hyperkalemia. Rx IV calcium, IV bicarbonate, Kayexalate for hyperkalemia. IV Rocephin for urinary infection. Urine culture. Admit to stepdown.  I personally performed the services described in this documentation, which was scribed in my presence. The recorded information has been reviewed and is accurate.    Nat Christen, MD 10/31/14 [place], MD 11/04/14 [phone]

## 2014-10-31 NOTE — Progress Notes (Signed)
[facility] Progress Note Patient Name: Greg [name] DOB: 1932/08/01 MRN: [id number]   Date of Service  10/31/2014  HPI/Events of Note  K 7.1  eICU Interventions  Bicarb gtt Calcium/ D50 -insulin Rpt kayexalate Renal input     Intervention Category Major Interventions: Acute renal failure - evaluation and management;Electrolyte abnormality - evaluation and management  Rajon Bisig V. 10/31/2014, 11:41 PM

## 2014-10-31 NOTE — ED Notes (Signed)
CRITICAL VALUE ALERT  Critical value received:  Potassium 7.0  Date of notification:  10-31-2014  Time of notification:  13:26  Critical value read back: yes  Nurse who received alert:  Luis Abed   MD notified (1st page):  Dr Lacinda Axon   Time of first page:  13:26  MD notified (2nd page):  Time of second page:  Responding MD:  Dr Lacinda Axon   Time MD responded:  13:26

## 2014-10-31 NOTE — Progress Notes (Signed)
Pt had critical value of 6.5 for K+, down from 7 earlier.  Also, serum glucose of 44.  MD notified, and orders given for an amp of D50 and recheck CBG after 15 minutes.  CBG obtained and, reading was 101.  MD informed.  Ileostomy bag contents emptied with 450 mL output.  MD ordered a BMET at 2200 to recheck K+ and allow time for Kayexalate to work.

## 2014-11-01 ENCOUNTER — Inpatient Hospital Stay (HOSPITAL_COMMUNITY): Payer: Medicare Other

## 2014-11-01 LAB — GLUCOSE, CAPILLARY
GLUCOSE-CAPILLARY: 49 mg/dL — AB (ref 70–99)
GLUCOSE-CAPILLARY: 126 mg/dL — AB (ref 70–99)
GLUCOSE-CAPILLARY: 111 mg/dL — AB (ref 70–99)
Glucose-Capillary: 101 mg/dL — ABNORMAL HIGH (ref 70–99)
Glucose-Capillary: 128 mg/dL — ABNORMAL HIGH (ref 70–99)
Glucose-Capillary: 147 mg/dL — ABNORMAL HIGH (ref 70–99)

## 2014-11-01 LAB — BLOOD GAS, ARTERIAL
ACID-BASE DEFICIT: 14.1 mmol/L — AB (ref 0.0–2.0)
Bicarbonate: 12.1 mEq/L — ABNORMAL LOW (ref 20.0–24.0)
DRAWN BY: 234301
FIO2: 21 %
O2 SAT: 96.4 %
PO2 ART: 96.3 mmHg (ref 80.0–100.0)
Patient temperature: 37
TCO2: 11.1 mmol/L (ref 0–100)
pCO2 arterial: 30.4 mmHg — ABNORMAL LOW (ref 35.0–45.0)
pH, Arterial: 7.223 — ABNORMAL LOW (ref 7.350–7.450)

## 2014-11-01 LAB — BASIC METABOLIC PANEL
Anion gap: 5 (ref 5–15)
BUN: 74 mg/dL — AB (ref 6–23)
CHLORIDE: 120 mmol/L — AB (ref 96–112)
CO2: 14 mmol/L — ABNORMAL LOW (ref 19–32)
Calcium: 9.4 mg/dL (ref 8.4–10.5)
Creatinine, Ser: 4.8 mg/dL — ABNORMAL HIGH (ref 0.50–1.35)
GFR calc Af Amer: 12 mL/min — ABNORMAL LOW (ref >90)
GFR calc non Af Amer: 10 mL/min — ABNORMAL LOW (ref >90)
GLUCOSE: 160 mg/dL — AB (ref 70–99)
POTASSIUM: 5.9 mmol/L — AB (ref 3.5–5.1)
Sodium: 139 mmol/L (ref 135–145)

## 2014-11-01 LAB — CBC
HEMATOCRIT: 31.2 % — AB (ref 39.0–52.0)
Hemoglobin: 9.2 g/dL — ABNORMAL LOW (ref 13.0–17.0)
MCH: 28.7 pg (ref 26.0–34.0)
MCHC: 29.5 g/dL — AB (ref 30.0–36.0)
MCV: 97.2 fL (ref 78.0–100.0)
Platelets: 268 10*3/uL (ref 150–400)
RBC: 3.21 MIL/uL — ABNORMAL LOW (ref 4.22–5.81)
RDW: 20 % — ABNORMAL HIGH (ref 11.5–15.5)
WBC: 6.9 10*3/uL (ref 4.0–10.5)

## 2014-11-01 LAB — VITAMIN B12: Vitamin B-12: 931 pg/mL — ABNORMAL HIGH (ref 211–911)

## 2014-11-01 MED ORDER — CALCIUM CHLORIDE 10 % IV SOLN
INTRAVENOUS | Status: AC
  Administered 2014-11-01: 1000 mg via INTRAVENOUS
  Filled 2014-11-01: qty 10

## 2014-11-01 MED ORDER — DEXTROSE 50 % IV SOLN
INTRAVENOUS | Status: AC
  Filled 2014-11-01: qty 50

## 2014-11-01 MED ORDER — SODIUM BICARBONATE 8.4 % IV SOLN
INTRAVENOUS | Status: AC
  Filled 2014-11-01: qty 150

## 2014-11-01 MED ORDER — SODIUM CHLORIDE 0.9 % IV SOLN
INTRAVENOUS | Status: DC
  Administered 2014-11-03 – 2014-11-06 (×5): via INTRAVENOUS

## 2014-11-01 MED ORDER — FUROSEMIDE 10 MG/ML IJ SOLN
INTRAMUSCULAR | Status: AC
  Filled 2014-11-01: qty 10

## 2014-11-01 MED ORDER — SODIUM BICARBONATE 8.4 % IV SOLN
INTRAVENOUS | Status: DC
  Administered 2014-11-01 – 2014-11-02 (×5): via INTRAVENOUS
  Filled 2014-11-01 (×12): qty 100

## 2014-11-01 MED ORDER — FUROSEMIDE 10 MG/ML IJ SOLN
100.0000 mg | Freq: Two times a day (BID) | INTRAMUSCULAR | Status: DC
  Administered 2014-11-01 – 2014-11-04 (×7): 100 mg via INTRAVENOUS
  Filled 2014-11-01 (×9): qty 10

## 2014-11-01 MED ORDER — DEXTROSE 5 % IV SOLN
100.0000 mg | Freq: Once | INTRAVENOUS | Status: AC
  Administered 2014-11-01: 100 mg via INTRAVENOUS
  Filled 2014-11-01: qty 10

## 2014-11-01 MED ORDER — SODIUM POLYSTYRENE SULFONATE 15 GM/60ML PO SUSP
30.0000 g | Freq: Once | ORAL | Status: AC
  Administered 2014-11-01: 30 g via ORAL
  Filled 2014-11-01: qty 120

## 2014-11-01 NOTE — Clinical Social Work Psychosocial (Addendum)
Clinical Social Work Department BRIEF PSYCHOSOCIAL ASSESSMENT 11/01/2014  Patient:  SHAQUILE, LUTZE     Account Number:  [id number]     Admit date:  10/31/2014  Clinical Social Worker:  Legrand Como  Date/Time:  11/01/2014 01:32 PM  Referred by:  CSW  Date Referred:  11/01/2014 Referred for  SNF Placement   Other Referral:   Interview type:  Family Other interview type:   Wife, Snyder Colavito    PSYCHOSOCIAL DATA Living Status:  FACILITY Admitted from facility:  [facility] Level of care:  [place] Primary support name:  Donevin Sainsbury Primary support relationship to patient:  SPOUSE Degree of support available:   Patient's wife is very supportive.    CURRENT CONCERNS Current Concerns  Post-Acute Placement   Other Concerns:    SOCIAL WORK ASSESSMENT / PLAN CSW met with patient and wife.  Patient was very minimally verbal.  Patient's wife provided most of the history. Mrs. Lanuza indicated that patient had been at [facility] since 10/16/14.  She stated that prior to patient's fall on 2/3, he was totally independent with ambulations and ADL's. Mrs. Scarantino indicated that patient is very weak and has a diagnosis of colon cancer.  CSW provided supportive counseling related to patient's cancer diagnosis.  Mrs. Woelfel indicated that she has paid to for four days of bed hold for patient as the plan is for patient to return to [facility] upon discharge.  Mrs. Mozer indicated that patient is now using a wheelchair.  She stated that the goal is for patient to gain strength through PT at where his ambulations will be independent again.  Mrs. Dunnam indicated that there is no way she could provide care with patient being so weak at this point.  CSW spoke with Keri at [facility].  Keri confirmed Mrs. Liera's statements. She indicated that patient could come back to the facility upon discharge. She indicated that no FL2 would be needed.   Assessment/plan status:  Information/Referral to  [employer] Other assessment/ plan:   Information/referral to community resources:   Patient is planning on returning to [facility] upon discharge.    PATIENT'S/FAMILY'S RESPONSE TO PLAN OF CARE: Patient is planning on discharging to [facility].   Ambrose Pancoast, [place]

## 2014-11-01 NOTE — Care Management Utilization Note (Signed)
UR completed 

## 2014-11-01 NOTE — Consult Note (Signed)
Reason for Consult: Hyperkalemia and acute kidney injury superimposed on chronic. Referring Physician: Dr. Debbe Odea Greg [name] is an 81 y.o. male.  HPI: He is a patient who has history of hypertension, diabetes, obstructive sleep apnea, chronic renal failure stage IV presently patient was brought because of weakness and history of fall. When he was evaluated in emergency room patient was found to have hyperkalemia and worsening of his renal failure. This is also associated with poor appetite and some nausea but no vomiting. Presently patient is still that he is feeling better. He denies any difficulty breathing..  Past Medical History  Diagnosis Date  . Type 2 diabetes mellitus   . Anemia of chronic disease   . CKD (chronic kidney disease) stage 3, GFR 30-59 ml/min   . Herpes zoster   . Obesity   . Exposure to hazardous substance   . Intermittent Leukopenia   . Essential hypertension   . Sleep apnea     Sleep Apnea score of 7  . Cardiomyopathy     LVEF 40-45% January 2015  . Left bundle branch block     Past Surgical History  Procedure Laterality Date  . Nasal polyp surgery    . Ventral hernia repair N/A 09/14/2013    Procedure: HERNIA REPAIR VENTRAL ADULT WITH MESH;  Surgeon: Jamesetta So, MD;  Location: [facility] ORS;  Service: General;  Laterality: N/A;  . Insertion of mesh N/A 09/14/2013    Procedure: INSERTION OF MESH;  Surgeon: Jamesetta So, MD;  Location: [facility] ORS;  Service: General;  Laterality: N/A;  . Colonoscopy N/A 10/04/2014    Procedure: COLONOSCOPY;  Surgeon: Danie Binder, MD;  Location: [facility] END[facility];  Service: Endoscopy;  Laterality: N/A;  . Esophagogastroduodenoscopy N/A 10/04/2014    Procedure: ESOPHAGOGASTRODUODENOSCOPY (EGD);  Surgeon: Danie Binder, MD;  Location: [facility];  Service: Endoscopy;  Laterality: N/A;  . Laparotomy N/A 10/08/2014    Procedure: Barrie Folk LAPAROTOMY;  Surgeon: Georganna Skeans, MD;  Location: [facility];  Service: General;  Laterality:  N/A;  . Partial colectomy N/A 10/08/2014    Procedure: PARTIAL COLECTOMY WITH ILEOSTOMY;  Surgeon: Georganna Skeans, MD;  Location: [facility] OR;  Service: General;  Laterality: N/A;    Family History  Problem Relation Age of Onset  . Heart attack Brother     CABG  . Diabetes Brother   . Stroke Father   . Heart Problems Mother   . Colon cancer Neg Hx     Social History:  reports that he has quit smoking. His smoking use included Cigars. His smokeless tobacco use includes Chew. He reports that he does not drink alcohol or use illicit drugs.  Allergies:  Allergies  Allergen Reactions  . Adhesive [Tape] Other (See Comments)    Pulls skin off.   Greg [name] Nausea And Vomiting    Medications: I have reviewed the patient's current medications.  Results for orders placed or performed during the hospital encounter of 10/31/14 (from the past 48 hour(s))  CBG monitoring, ED     Status: Abnormal   Collection Time: 10/31/14 12:05 PM  Result Value Ref Range   Glucose-Capillary 170 (H) 70 - 99 mg/dL  TSH     Status: Abnormal   Collection Time: 10/31/14 12:37 PM  Result Value Ref Range   TSH 7.046 (H) 0.350 - 4.500 uIU/mL  CBC with Differential/Platelet     Status: Abnormal   Collection Time: 10/31/14 12:41 PM  Result Value Ref Range  WBC 8.2 4.0 - 10.5 K/uL   RBC 3.20 (L) 4.22 - 5.81 MIL/uL   Hemoglobin 9.3 (L) 13.0 - 17.0 g/dL   HCT 31.0 (L) 39.0 - 52.0 %   MCV 96.9 78.0 - 100.0 fL   MCH 29.1 26.0 - 34.0 pg   MCHC 30.0 30.0 - 36.0 g/dL   RDW 19.6 (H) 11.5 - 15.5 %   Platelets 282 150 - 400 K/uL   Neutrophils Relative % 58 43 - 77 %   Neutro Abs 4.9 1.7 - 7.7 K/uL   Lymphocytes Relative 28 12 - 46 %   Lymphs Abs 2.3 0.7 - 4.0 K/uL   Monocytes Relative 10 3 - 12 %   Monocytes Absolute 0.8 0.1 - 1.0 K/uL   Eosinophils Relative 3 0 - 5 %   Eosinophils Absolute 0.2 0.0 - 0.7 K/uL   Basophils Relative 1 0 - 1 %   Basophils Absolute 0.0 0.0 - 0.1 K/uL  Comprehensive metabolic panel      Status: Abnormal   Collection Time: 10/31/14 12:41 PM  Result Value Ref Range   Sodium 136 135 - 145 mmol/L   Potassium 7.0 (HH) 3.5 - 5.1 mmol/L    Comment: REPEATED TO VERIFY CRITICAL RESULT CALLED TO, READ BACK BY AND VERIFIED WITH: POINTSDEXTER,M AT 1325 ON 10/31/14 BY MOSLEYJ    Chloride 119 (H) 96 - 112 mmol/L   CO2 14 (L) 19 - 32 mmol/L   Glucose, Bld 165 (H) 70 - 99 mg/dL   BUN 78 (H) 6 - 23 mg/dL   Creatinine, Ser 5.11 (H) 0.50 - 1.35 mg/dL   Calcium 8.7 8.4 - 10.5 mg/dL   Total Protein 6.4 6.0 - 8.3 g/dL   Albumin 2.9 (L) 3.5 - 5.2 g/dL   AST 12 0 - 37 U/L   ALT 14 0 - 53 U/L   Alkaline Phosphatase 63 39 - 117 U/L   Total Bilirubin 0.4 0.3 - 1.2 mg/dL   GFR calc non Af Amer 10 (L) >90 mL/min   GFR calc Af Amer 11 (L) >90 mL/min    Comment: (NOTE) The eGFR has been calculated using the CKD EPI equation. This calculation has not been validated in all clinical situations. eGFR's persistently <90 mL/min signify possible Chronic Kidney Disease.    Anion gap 3 (L) 5 - 15  Urinalysis, Routine w reflex microscopic     Status: Abnormal   Collection Time: 10/31/14  1:20 PM  Result Value Ref Range   Color, Urine YELLOW YELLOW   APPearance CLOUDY (A) CLEAR   Specific Gravity, Urine 1.025 1.005 - 1.030   pH 6.0 5.0 - 8.0   Glucose, UA NEGATIVE NEGATIVE mg/dL   Hgb urine dipstick LARGE (A) NEGATIVE   Bilirubin Urine NEGATIVE NEGATIVE   Ketones, ur NEGATIVE NEGATIVE mg/dL   Protein, ur 100 (A) NEGATIVE mg/dL   Urobilinogen, UA 0.2 0.0 - 1.0 mg/dL   Nitrite NEGATIVE NEGATIVE   Leukocytes, UA MODERATE (A) NEGATIVE  Urine microscopic-add on     Status: Abnormal   Collection Time: 10/31/14  1:20 PM  Result Value Ref Range   Squamous Epithelial / LPF RARE RARE   WBC, UA TOO NUMEROUS TO COUNT <3 WBC/hpf   RBC / HPF 11-20 <3 RBC/hpf   Bacteria, UA MANY (A) RARE  Lactic acid, plasma     Status: None   Collection Time: 10/31/14  2:29 PM  Result Value Ref Range   Lactic Acid,  Venous 1.1 0.5 -  2.0 mmol/L  Lactic acid, plasma     Status: None   Collection Time: 10/31/14  4:59 PM  Result Value Ref Range   Lactic Acid, Venous 1.1 0.5 - 2.0 mmol/L  Basic metabolic panel     Status: Abnormal   Collection Time: 10/31/14  4:59 PM  Result Value Ref Range   Sodium 139 135 - 145 mmol/L   Potassium 6.5 (HH) 3.5 - 5.1 mmol/L    Comment: REPEATED TO VERIFY CRITICAL RESULT CALLED TO, READ BACK BY AND VERIFIED WITH: RAY,M AT 1728 ON 10/31/14 BY MOSLEY,J    Chloride 121 (H) 96 - 112 mmol/L   CO2 15 (L) 19 - 32 mmol/L   Glucose, Bld 44 (LL) 70 - 99 mg/dL    Comment: REPEATED TO VERIFY CRITICAL RESULT CALLED TO, READ BACK BY AND VERIFIED WITH: RAY,M AT 1728 ON 10/31/14 BY MOSLEY,J    BUN 78 (H) 6 - 23 mg/dL   Creatinine, Ser 5.05 (H) 0.50 - 1.35 mg/dL   Calcium 8.8 8.4 - 10.5 mg/dL   GFR calc non Af Amer 10 (L) >90 mL/min   GFR calc Af Amer 11 (L) >90 mL/min    Comment: (NOTE) The eGFR has been calculated using the CKD EPI equation. This calculation has not been validated in all clinical situations. eGFR's persistently <90 mL/min signify possible Chronic Kidney Disease.    Anion gap 3 (L) 5 - 15  Basic metabolic panel     Status: Abnormal   Collection Time: 10/31/14 10:03 PM  Result Value Ref Range   Sodium 137 135 - 145 mmol/L    Comment: SLIGHT HEMOLYSIS   Potassium 7.1 (HH) 3.5 - 5.1 mmol/L    Comment: REPEATED TO VERIFY CRITICAL RESULT CALLED TO, READ BACK BY AND VERIFIED WITH: LEE,B AT 2319 ON 10/31/14 BY MOSLEY,J    Chloride 122 (H) 96 - 112 mmol/L   CO2 10 (LL) 19 - 32 mmol/L    Comment: REPEATED TO VERIFY CRITICAL RESULT CALLED TO, READ BACK BY AND VERIFIED WITH: LEE,B AT 2319 ON 10/31/14 BY MOSLEY,J    Glucose, Bld 123 (H) 70 - 99 mg/dL   BUN 75 (H) 6 - 23 mg/dL   Creatinine, Ser 4.87 (H) 0.50 - 1.35 mg/dL   Calcium 8.6 8.4 - 10.5 mg/dL   GFR calc non Af Amer 10 (L) >90 mL/min   GFR calc Af Amer 12 (L) >90 mL/min    Comment: (NOTE) The eGFR has  been calculated using the CKD EPI equation. This calculation has not been validated in all clinical situations. eGFR's persistently <90 mL/min signify possible Chronic Kidney Disease.    Anion gap 5 5 - 15    Dg Chest Port 1 View  10/31/2014   CLINICAL DATA:  81 year old male presenting with hyperkalemia. Recent history of colonic surgery.  EXAM: PORTABLE CHEST - 1 VIEW  COMPARISON:  Chest x-ray 10/10/2014.  FINDINGS: Lung volumes are slightly low. Film is slightly underpenetrated, limiting the diagnostic sensitivity and specificity of this examination. With these limitations in mind, there is no definite acute consolidative airspace disease, and no pleural effusions. No evidence of pulmonary edema. Heart size is mildly enlarged. The patient is rotated to the left on today's exam, resulting in distortion of the mediastinal contours and reduced diagnostic sensitivity and specificity for mediastinal pathology.  IMPRESSION: 1. Low lung volumes without radiographic evidence of acute cardiopulmonary disease.   Electronically Signed   By: Vinnie Langton M.D.   On: 10/31/2014 13:28  Review of Systems  Constitutional: Positive for malaise/fatigue.  Respiratory: Negative for shortness of breath.   Cardiovascular: Negative for orthopnea and leg swelling.  Gastrointestinal: Positive for nausea. Negative for vomiting and abdominal pain.  Neurological: Positive for weakness.   Blood pressure 129/34, pulse 35, temperature 98.1 F (36.7 C), temperature source Oral, resp. rate 14, height 5' 11"  (1.803 m), weight 82.9 kg (182 lb 12.2 oz), SpO2 100 %. Physical Exam  Constitutional: No distress.  HENT:  Mouth/Throat: No oropharyngeal exudate.  Neck: No JVD present.  Cardiovascular: Normal rate and regular rhythm.   No murmur heard. Respiratory: He has no wheezes. He has no rales.  GI: There is no tenderness. There is no rebound.  Musculoskeletal: He exhibits no edema.  Neurological: He is alert.     Assessment/Plan: Problem #1 hyperkalemia possibly a compression of worsening of renal failure, also from high potassium intake including orange juice, milk, and sure. Patient was treated conservatively. Problem #2 acute kidney injury: Possibly prerenal as patient by mouth intake is limited and also fluid loss from his colostomy. Since patient has also episode of hypotension ATN need to be also to be considered. Problem #3 possible metabolic acidosis Problem #4 history of chronic renal failure: Stage IV. Etiology was thought to be secondary to diabetes/possible focal segmental nephrosclerosis from recurrent UTI/ischemic Problem #5 history of adenocarcinoma of the colon status post partial resection with colostomy placement. Problem #5 history of mitral fibrillation: Rate is controlled Problem #7 history of DVT Problem #8 history of cardiomyopathy with low ejection fraction of 20-35% Problem #9 history of diabetes Plan: We'll check ABG We'll change his IV fluid to 1/4 saline with 100 mEq of sodium bicarbonate at 125 mL per hour We'll give him Lasix 100 mg IV twice a day We'll check his basic metabolic panel in the morning. If his potassium is difficult to correct with conservative major do not work we will make arrangement for  Dialysis.I have discussed with patient  Greg [name] S 11/01/2014, 8:02 AM

## 2014-11-01 NOTE — Care Management Note (Addendum)
    Page 1 of 1   11/16/2014     9:24:08 AM CARE MANAGEMENT NOTE 10/28/2014  Patient:  Greg [name], Greg [name]   Account Number:  [id number]  Date Initiated:  11/01/2014  Documentation initiated by:  Jolene Provost  Subjective/Objective Assessment:   Pt is from [facility] for rehab. Pt plans to return to [facility] at discharge. CSW is aware and will arrange for return to facility. No CM needs identified.     Action/Plan:   Anticipated DC Date:  11/04/2014   Anticipated DC Plan:  SKILLED NURSING FACILITY  In-house referral  Clinical Social Worker      DC Planning Services  CM consult      Choice offered to / List presented to:             Status of service:  Completed, signed off Medicare Important Message given?  YES (If response is "NO", the following Medicare IM given date fields will be blank) Date Medicare IM given:  11/05/2014 Medicare IM given by:  Jolene Provost Date Additional Medicare IM given:  10/30/2014 Additional Medicare IM given by:  Theophilus Kinds  Discharge Disposition:  [place]  Per UR Regulation:  Reviewed for med. necessity/level of care/duration of stay  If discussed at [place] of Stay Meetings, dates discussed:    Comments:  11/15/2014 [place], RN BSN CM Pt discharged to [facility] today. CSW to arrange discharge to facility.  11/05/2014 [place], RN, MSN, CM 11/01/2014 [place], RN, MSN, CM

## 2014-11-01 NOTE — Progress Notes (Signed)
Subjective: He was admitted yesterday with acute on chronic renal failure and hyperkalemia. Multiple problems over the last several weeks. He was admitted here with atrial fibrillation with rapid ventricular response, increasing problems with heart failure, anemia and was found to have a colon cancer. This was near obstructing. It was not felt that he could have his surgery safely here and he was transferred to [facility] where he eventually underwent surgical treatment. He has been in the skilled care facility and over the last several days did not eat well and was found to have what appeared to be a urinary tract infection. His renal function was abnormal and did not respond to treatment and he was sent to the emergency department and eventually admitted.  Objective: Vital signs in last 24 hours: Temp:  [97.7 F (36.5 C)-98.5 F (36.9 C)] 98.1 F (36.7 C) (03/14 0500) Pulse Rate:  [33-100] 35 (03/14 0700) Resp:  [12-20] 14 (03/14 0700) BP: (82-141)/(32-80) 129/34 mmHg (03/14 0700) SpO2:  [97 %-100 %] 100 % (03/14 0700) Weight:  [82.9 kg (182 lb 12.2 oz)-83.915 kg (185 lb)] 82.9 kg (182 lb 12.2 oz) (03/14 0500) Weight change:  Last BM Date: 10/31/14  Intake/Output from previous day: 03/13 0701 - 03/14 0700 In: 944.6 [I.V.:884.6; IV Piggyback:60] Out: 1600 [Urine:550; Stool:1050]  PHYSICAL EXAM General appearance: alert, cooperative and mild distress Resp: clear to auscultation bilaterally Cardio: irregularly irregular rhythm GI: Soft nontender colostomy intact Extremities: extremities normal, atraumatic, no cyanosis or edema  Lab Results:  Results for orders placed or performed during the hospital encounter of 10/31/14 (from the past 48 hour(s))  CBG monitoring, ED     Status: Abnormal   Collection Time: 10/31/14 12:05 PM  Result Value Ref Range   Glucose-Capillary 170 (H) 70 - 99 mg/dL  TSH     Status: Abnormal   Collection Time: 10/31/14 12:37 PM  Result Value Ref Range    TSH 7.046 (H) 0.350 - 4.500 uIU/mL  CBC with Differential/Platelet     Status: Abnormal   Collection Time: 10/31/14 12:41 PM  Result Value Ref Range   WBC 8.2 4.0 - 10.5 K/uL   RBC 3.20 (L) 4.22 - 5.81 MIL/uL   Hemoglobin 9.3 (L) 13.0 - 17.0 g/dL   HCT 31.0 (L) 39.0 - 52.0 %   MCV 96.9 78.0 - 100.0 fL   MCH 29.1 26.0 - 34.0 pg   MCHC 30.0 30.0 - 36.0 g/dL   RDW 19.6 (H) 11.5 - 15.5 %   Platelets 282 150 - 400 K/uL   Neutrophils Relative % 58 43 - 77 %   Neutro Abs 4.9 1.7 - 7.7 K/uL   Lymphocytes Relative 28 12 - 46 %   Lymphs Abs 2.3 0.7 - 4.0 K/uL   Monocytes Relative 10 3 - 12 %   Monocytes Absolute 0.8 0.1 - 1.0 K/uL   Eosinophils Relative 3 0 - 5 %   Eosinophils Absolute 0.2 0.0 - 0.7 K/uL   Basophils Relative 1 0 - 1 %   Basophils Absolute 0.0 0.0 - 0.1 K/uL  Comprehensive metabolic panel     Status: Abnormal   Collection Time: 10/31/14 12:41 PM  Result Value Ref Range   Sodium 136 135 - 145 mmol/L   Potassium 7.0 (HH) 3.5 - 5.1 mmol/L    Comment: REPEATED TO VERIFY CRITICAL RESULT CALLED TO, READ BACK BY AND VERIFIED WITH: POINTSDEXTER,M AT 1325 ON 10/31/14 BY MOSLEYJ    Chloride 119 (H) 96 - 112 mmol/L  CO2 14 (L) 19 - 32 mmol/L   Glucose, Bld 165 (H) 70 - 99 mg/dL   BUN 78 (H) 6 - 23 mg/dL   Creatinine, Ser 5.11 (H) 0.50 - 1.35 mg/dL   Calcium 8.7 8.4 - 10.5 mg/dL   Total Protein 6.4 6.0 - 8.3 g/dL   Albumin 2.9 (L) 3.5 - 5.2 g/dL   AST 12 0 - 37 U/L   ALT 14 0 - 53 U/L   Alkaline Phosphatase 63 39 - 117 U/L   Total Bilirubin 0.4 0.3 - 1.2 mg/dL   GFR calc non Af Amer 10 (L) >90 mL/min   GFR calc Af Amer 11 (L) >90 mL/min    Comment: (NOTE) The eGFR has been calculated using the CKD EPI equation. This calculation has not been validated in all clinical situations. eGFR's persistently <90 mL/min signify possible Chronic Kidney Disease.    Anion gap 3 (L) 5 - 15  Urinalysis, Routine w reflex microscopic     Status: Abnormal   Collection Time: 10/31/14  1:20  PM  Result Value Ref Range   Color, Urine YELLOW YELLOW   APPearance CLOUDY (A) CLEAR   Specific Gravity, Urine 1.025 1.005 - 1.030   pH 6.0 5.0 - 8.0   Glucose, UA NEGATIVE NEGATIVE mg/dL   Hgb urine dipstick LARGE (A) NEGATIVE   Bilirubin Urine NEGATIVE NEGATIVE   Ketones, ur NEGATIVE NEGATIVE mg/dL   Protein, ur 100 (A) NEGATIVE mg/dL   Urobilinogen, UA 0.2 0.0 - 1.0 mg/dL   Nitrite NEGATIVE NEGATIVE   Leukocytes, UA MODERATE (A) NEGATIVE  Urine microscopic-add on     Status: Abnormal   Collection Time: 10/31/14  1:20 PM  Result Value Ref Range   Squamous Epithelial / LPF RARE RARE   WBC, UA TOO NUMEROUS TO COUNT <3 WBC/hpf   RBC / HPF 11-20 <3 RBC/hpf   Bacteria, UA MANY (A) RARE  Lactic acid, plasma     Status: None   Collection Time: 10/31/14  2:29 PM  Result Value Ref Range   Lactic Acid, Venous 1.1 0.5 - 2.0 mmol/L  Lactic acid, plasma     Status: None   Collection Time: 10/31/14  4:59 PM  Result Value Ref Range   Lactic Acid, Venous 1.1 0.5 - 2.0 mmol/L  Basic metabolic panel     Status: Abnormal   Collection Time: 10/31/14  4:59 PM  Result Value Ref Range   Sodium 139 135 - 145 mmol/L   Potassium 6.5 (HH) 3.5 - 5.1 mmol/L    Comment: REPEATED TO VERIFY CRITICAL RESULT CALLED TO, READ BACK BY AND VERIFIED WITH: RAY,M AT 1728 ON 10/31/14 BY MOSLEY,J    Chloride 121 (H) 96 - 112 mmol/L   CO2 15 (L) 19 - 32 mmol/L   Glucose, Bld 44 (LL) 70 - 99 mg/dL    Comment: REPEATED TO VERIFY CRITICAL RESULT CALLED TO, READ BACK BY AND VERIFIED WITH: RAY,M AT 1728 ON 10/31/14 BY MOSLEY,J    BUN 78 (H) 6 - 23 mg/dL   Creatinine, Ser 5.05 (H) 0.50 - 1.35 mg/dL   Calcium 8.8 8.4 - 10.5 mg/dL   GFR calc non Af Amer 10 (L) >90 mL/min   GFR calc Af Amer 11 (L) >90 mL/min    Comment: (NOTE) The eGFR has been calculated using the CKD EPI equation. This calculation has not been validated in all clinical situations. eGFR's persistently <90 mL/min signify possible Chronic  Kidney Disease.    Anion gap  3 (L) 5 - 15  Basic metabolic panel     Status: Abnormal   Collection Time: 10/31/14 10:03 PM  Result Value Ref Range   Sodium 137 135 - 145 mmol/L    Comment: SLIGHT HEMOLYSIS   Potassium 7.1 (HH) 3.5 - 5.1 mmol/L    Comment: REPEATED TO VERIFY CRITICAL RESULT CALLED TO, READ BACK BY AND VERIFIED WITH: LEE,B AT 2319 ON 10/31/14 BY MOSLEY,J    Chloride 122 (H) 96 - 112 mmol/L   CO2 10 (LL) 19 - 32 mmol/L    Comment: REPEATED TO VERIFY CRITICAL RESULT CALLED TO, READ BACK BY AND VERIFIED WITH: LEE,B AT 2319 ON 10/31/14 BY MOSLEY,J    Glucose, Bld 123 (H) 70 - 99 mg/dL   BUN 75 (H) 6 - 23 mg/dL   Creatinine, Ser 4.87 (H) 0.50 - 1.35 mg/dL   Calcium 8.6 8.4 - 10.5 mg/dL   GFR calc non Af Amer 10 (L) >90 mL/min   GFR calc Af Amer 12 (L) >90 mL/min    Comment: (NOTE) The eGFR has been calculated using the CKD EPI equation. This calculation has not been validated in all clinical situations. eGFR's persistently <90 mL/min signify possible Chronic Kidney Disease.    Anion gap 5 5 - 15    ABGS No results for input(s): PHART, PO2ART, TCO2, HCO3 in the last 72 hours.  Invalid input(s): PCO2 CULTURES No results found for this or any previous visit (from the past 240 hour(s)). Studies/Results: Dg Chest Port 1 View  10/31/2014   CLINICAL DATA:  81 year old male presenting with hyperkalemia. Recent history of colonic surgery.  EXAM: PORTABLE CHEST - 1 VIEW  COMPARISON:  Chest x-ray 10/10/2014.  FINDINGS: Lung volumes are slightly low. Film is slightly underpenetrated, limiting the diagnostic sensitivity and specificity of this examination. With these limitations in mind, there is no definite acute consolidative airspace disease, and no pleural effusions. No evidence of pulmonary edema. Heart size is mildly enlarged. The patient is rotated to the left on today's exam, resulting in distortion of the mediastinal contours and reduced diagnostic sensitivity and  specificity for mediastinal pathology.  IMPRESSION: 1. Low lung volumes without radiographic evidence of acute cardiopulmonary disease.   Electronically Signed   By: Vinnie Langton M.D.   On: 10/31/2014 13:28    Medications:  Prior to Admission:  Prescriptions prior to admission  Medication Sig Dispense Refill Last Dose  . amiodarone (PACERONE) 200 MG tablet Take 1 tablet (200 mg total) by mouth 2 (two) times daily.   10/31/2014  . atorvastatin (LIPITOR) 20 MG tablet Take 20 mg by mouth daily.   10/30/2014  . Balsam [place]-Castor Oil (VENELEX) OINT Apply 1 application topically 3 (three) times daily.   10/31/2014  . enoxaparin (LOVENOX) 100 MG/ML injection Inject 0.95 mLs (95 mg total) into the skin daily. 0 Syringe  10/31/2014  . levothyroxine (SYNTHROID, LEVOTHROID) 125 MCG tablet Take 125 mcg by mouth daily before breakfast.   10/31/2014  . Multiple Vitamins-Minerals (CENTRUM SILVER PO) Take 1 tablet by mouth daily.   10/31/2014  . mupirocin ointment (BACTROBAN) 2 % Place 1 application into the nose 2 (two) times daily.   10/31/2014  . omeprazole (PRILOSEC) 20 MG capsule Take 20 mg by mouth daily.   10/31/2014  . sertraline (ZOLOFT) 50 MG tablet Take 50 mg by mouth daily.   10/31/2014  . acetaminophen (TYLENOL) 325 MG tablet Take 1-2 tablets (325-650 mg total) by mouth every 6 (six) hours as needed for  fever, headache, mild pain or moderate pain.   Unknown  . loperamide (IMODIUM) 2 MG capsule Take 1 capsule (2 mg total) by mouth as needed for diarrhea or loose stools. 30 capsule 0 Unknown   Scheduled: . amiodarone  200 mg Oral BID  . antiseptic oral rinse  7 mL Mouth Rinse BID  . atorvastatin  20 mg Oral QPC supper  . dextrose      . enoxaparin  85 mg Subcutaneous Q24H  . insulin aspart  0-5 Units Subcutaneous QHS  . insulin aspart  0-9 Units Subcutaneous TID WC  . insulin aspart  3 Units Subcutaneous TID WC  . levothyroxine  125 mcg Oral QAC breakfast  . multivitamin with minerals  1 tablet  Oral Daily  . pantoprazole  40 mg Oral Daily  . sertraline  50 mg Oral Daily   Continuous: . sodium chloride    .  sodium bicarbonate  infusion 1000 mL 100 mL/hr at 11/01/14 0500   [id number] **OR** acetaminophen, ondansetron **OR** ondansetron (ZOFRAN) IV, senna-docusate  Assesment: He was admitted with acute on chronic renal failure. He has hyperkalemia. Lab work is pending this morning. He is being treated with fluid resuscitation but we have to be careful with that because his ejection fraction was only about 25%. He has colon cancer and a colostomy. He appears to have a urinary tract infection Principal Problem:   Hyperkalemia Active Problems:   ARF (acute renal failure)   CKD (chronic kidney disease) stage 4, GFR 15-29 ml/min   Chronic combined systolic and diastolic CHF (congestive heart failure)   Colon cancer    Plan: Continue current treatments. He is on a sodium bicarbonate drip. His lab work is pending. Nephrology consultation is underway. He needs to be treated for his UTI    LOS: 1 day   [employer] L 11/01/2014, 8:05 AM

## 2014-11-02 LAB — GLUCOSE, CAPILLARY
GLUCOSE-CAPILLARY: 234 mg/dL — AB (ref 70–99)
Glucose-Capillary: 170 mg/dL — ABNORMAL HIGH (ref 70–99)
Glucose-Capillary: 161 mg/dL — ABNORMAL HIGH (ref 70–99)
Glucose-Capillary: 194 mg/dL — ABNORMAL HIGH (ref 70–99)
Glucose-Capillary: 77 mg/dL (ref 70–99)

## 2014-11-02 LAB — BASIC METABOLIC PANEL
Anion gap: 7 (ref 5–15)
BUN: 70 mg/dL — ABNORMAL HIGH (ref 6–23)
CHLORIDE: 113 mmol/L — AB (ref 96–112)
CO2: 18 mmol/L — ABNORMAL LOW (ref 19–32)
CREATININE: 4.74 mg/dL — AB (ref 0.50–1.35)
Calcium: 8.7 mg/dL (ref 8.4–10.5)
GFR calc Af Amer: 12 mL/min — ABNORMAL LOW (ref >90)
GFR, EST NON AFRICAN AMERICAN: 11 mL/min — AB (ref >90)
Glucose, Bld: 173 mg/dL — ABNORMAL HIGH (ref 70–99)
Potassium: 5.5 mmol/L — ABNORMAL HIGH (ref 3.5–5.1)
Sodium: 138 mmol/L (ref 135–145)

## 2014-11-02 NOTE — Progress Notes (Signed)
Greg [name]  MRN: [id number]  DOB/AGE: 1932/05/01 81 y.o.  Primary Care Physician:HAWKINS,EDWARD L, MD  Admit date: 10/31/2014  Chief Complaint:  Chief Complaint  Patient presents with  . Abnormal Lab    S-Pt presented on  10/31/2014 with  Chief Complaint  Patient presents with  . Abnormal Lab  .    Pt offers no new complaints    Pt wife says " he will not be on dialysis " meds  . amiodarone  200 mg Oral BID  . antiseptic oral rinse  7 mL Mouth Rinse BID  . atorvastatin  20 mg Oral QPC supper  . enoxaparin  85 mg Subcutaneous Q24H  . furosemide  100 mg Intravenous BID  . insulin aspart  0-5 Units Subcutaneous QHS  . insulin aspart  0-9 Units Subcutaneous TID WC  . insulin aspart  3 Units Subcutaneous TID WC  . levothyroxine  125 mcg Oral QAC breakfast  . multivitamin with minerals  1 tablet Oral Daily  . pantoprazole  40 mg Oral Daily  . sertraline  50 mg Oral Daily       Physical Exam: Vital signs in last 24 hours: Temp:  [97.2 F (36.2 C)-98.1 F (36.7 C)] 97.6 F (36.4 C) (03/15 1600) Pulse Rate:  [33-77] 55 (03/15 1600) Resp:  [10-22] 18 (03/15 1600) BP: (86-133)/(37-93) 104/50 mmHg (03/15 1600) SpO2:  [96 %-100 %] 100 % (03/15 1600) Weight:  [182 lb 1.6 oz (82.6 kg)] 182 lb 1.6 oz (82.6 kg) (03/15 0500) Weight change: -2 lb 14.4 oz (-1.315 kg) Last BM Date: 11/02/14  Intake/Output from previous day: 03/14 0701 - 03/15 0700 In: 2290.8 [I.V.:2170.8; IV Piggyback:120] Out: 5400 [Urine:1050; Stool:1800] Total I/O In: 1550 [I.V.:1500; IV Piggyback:50] Out: 1025 [Urine:500; Stool:525]   Physical Exam: General- pt is awake,alert, oriented to time place and person Resp- No acute REsp distress, CTA B/L NO Rhonchi CVS- S1S2 regular in rate and rhythm GIT- BS+, soft, NT, ND, Bag in situ EXT- NO LE Edema, Cyanosis   Lab Results: CBC  Recent Labs  10/31/14 1241 11/01/14 0732  WBC 8.2 6.9  HGB 9.3* 9.2*  HCT 31.0* 31.2*  PLT 282 268     BMET  Recent Labs  11/01/14 0732 11/02/14 0539  NA 139 138  K 5.9* 5.5*  CL 120* 113*  CO2 14* 18*  GLUCOSE 160* 173*  BUN 74* 70*  CREATININE 4.80* 4.74*  CALCIUM 9.4 8.7   Creat trend 2016 5.0=> 4.8=>4.74     2.5--3.2( February admission) 2015  1.7--2.6 2014 1.95 2012 1.8 2011 1.6--2.1 2009 1.6  Potassium 2016 7.5=>5.5\   MICRO Recent Results (from the past 240 hour(s))  Urine culture     Status: None (Preliminary result)   Collection Time: 10/31/14  1:20 PM  Result Value Ref Range Status   Specimen Description URINE, CATHETERIZED  Final   Special Requests IMMUNE:COMPROMISED  Final   Colony Count   Final    >=100,000 COLONIES/ML Performed at [facility]   Final    ENTEROCOCCUS SPECIES Performed at [facility]    Report Status PENDING  Incomplete      Lab Results  Component Value Date   PTH 186.4* 09/04/2013   CALCIUM 8.7 11/02/2014   CAION 1.19 10/08/2014   PHOS 4.8* 10/12/2014         Impression: 1)Renal  AKI secondary to Hypovolemia  AKI sec to Prerenal/ATN               AKi improving               AKI on CKD               CKD stage 4.               CKD since 2009               CKD secondary to DM/ Post  Renal/Age associated decline                Progression of CKD marked with AKI                  2)HTN Medication-  On Diuretics  3)Anemia HGb at goal (9--11)   4)CKD Mineral-Bone Disorder PTH acceptable . Secondary Hyperparathyroidism present. Phosphorus at goal. Calcium at goal.  5)GI- hx of Colon Adenocarcinoma  s/p Colectomy and s/p Colostomy  Primary MD following  6)Electrolytes Hyperkalemic Improving  NOrmonatremic   7)Acid base Co2 not at goal On IV bicarb 14=>18 improving    Plan:  Will continue current care.      Arlando Leisinger S 11/02/2014, 5:42 PM

## 2014-11-02 NOTE — Progress Notes (Signed)
Subjective: He says he feels a little better. He has no new complaints. His renal function is about the same.  Objective: Vital signs in last 24 hours: Temp:  [97.2 F (36.2 C)-98.1 F (36.7 C)] 98 F (36.7 C) (03/15 0400) Pulse Rate:  [33-101] 35 (03/15 0700) Resp:  [12-20] 17 (03/15 0700) BP: (86-137)/(39-93) 133/93 mmHg (03/15 0700) SpO2:  [97 %-100 %] 98 % (03/15 0700) Weight:  [82.6 kg (182 lb 1.6 oz)] 82.6 kg (182 lb 1.6 oz) (03/15 0500) Weight change: -1.315 kg (-2 lb 14.4 oz) Last BM Date: 11/01/14  Intake/Output from previous day: 03/14 0701 - 03/15 0700 In: 2290.8 [I.V.:2170.8; IV Piggyback:120] Out: 2850 [Urine:1050; Stool:1800]  PHYSICAL EXAM General appearance: alert, cooperative and mild distress Resp: clear to auscultation bilaterally Cardio: irregularly irregular rhythm GI: soft, non-tender; bowel sounds normal; no masses,  no organomegaly Extremities: extremities normal, atraumatic, no cyanosis or edema  Lab Results:  Results for orders placed or performed during the hospital encounter of 10/31/14 (from the past 48 hour(s))  CBG monitoring, ED     Status: Abnormal   Collection Time: 10/31/14 12:05 PM  Result Value Ref Range   Glucose-Capillary 170 (H) 70 - 99 mg/dL  TSH     Status: Abnormal   Collection Time: 10/31/14 12:37 PM  Result Value Ref Range   TSH 7.046 (H) 0.350 - 4.500 uIU/mL  Vitamin B12     Status: Abnormal   Collection Time: 10/31/14 12:37 PM  Result Value Ref Range   Vitamin B-12 931 (H) 211 - 911 pg/mL    Comment: Performed at [facility]  CBC with Differential/Platelet     Status: Abnormal   Collection Time: 10/31/14 12:41 PM  Result Value Ref Range   WBC 8.2 4.0 - 10.5 K/uL   RBC 3.20 (L) 4.22 - 5.81 MIL/uL   Hemoglobin 9.3 (L) 13.0 - 17.0 g/dL   HCT 31.0 (L) 39.0 - 52.0 %   MCV 96.9 78.0 - 100.0 fL   MCH 29.1 26.0 - 34.0 pg   MCHC 30.0 30.0 - 36.0 g/dL   RDW 19.6 (H) 11.5 - 15.5 %   Platelets 282 150 - 400 K/uL   Neutrophils Relative % 58 43 - 77 %   Neutro Abs 4.9 1.7 - 7.7 K/uL   Lymphocytes Relative 28 12 - 46 %   Lymphs Abs 2.3 0.7 - 4.0 K/uL   Monocytes Relative 10 3 - 12 %   Monocytes Absolute 0.8 0.1 - 1.0 K/uL   Eosinophils Relative 3 0 - 5 %   Eosinophils Absolute 0.2 0.0 - 0.7 K/uL   Basophils Relative 1 0 - 1 %   Basophils Absolute 0.0 0.0 - 0.1 K/uL  Comprehensive metabolic panel     Status: Abnormal   Collection Time: 10/31/14 12:41 PM  Result Value Ref Range   Sodium 136 135 - 145 mmol/L   Potassium 7.0 (HH) 3.5 - 5.1 mmol/L    Comment: REPEATED TO VERIFY CRITICAL RESULT CALLED TO, READ BACK BY AND VERIFIED WITH: POINTSDEXTER,M AT 1325 ON 10/31/14 BY MOSLEYJ    Chloride 119 (H) 96 - 112 mmol/L   CO2 14 (L) 19 - 32 mmol/L   Glucose, Bld 165 (H) 70 - 99 mg/dL   BUN 78 (H) 6 - 23 mg/dL   Creatinine, Ser 5.11 (H) 0.50 - 1.35 mg/dL   Calcium 8.7 8.4 - 10.5 mg/dL   Total Protein 6.4 6.0 - 8.3 g/dL   Albumin 2.9 (L) 3.5 -  5.2 g/dL   AST 12 0 - 37 U/L   ALT 14 0 - 53 U/L   Alkaline Phosphatase 63 39 - 117 U/L   Total Bilirubin 0.4 0.3 - 1.2 mg/dL   GFR calc non Af Amer 10 (L) >90 mL/min   GFR calc Af Amer 11 (L) >90 mL/min    Comment: (NOTE) The eGFR has been calculated using the CKD EPI equation. This calculation has not been validated in all clinical situations. eGFR's persistently <90 mL/min signify possible Chronic Kidney Disease.    Anion gap 3 (L) 5 - 15  Urinalysis, Routine w reflex microscopic     Status: Abnormal   Collection Time: 10/31/14  1:20 PM  Result Value Ref Range   Color, Urine YELLOW YELLOW   APPearance CLOUDY (A) CLEAR   Specific Gravity, Urine 1.025 1.005 - 1.030   pH 6.0 5.0 - 8.0   Glucose, UA NEGATIVE NEGATIVE mg/dL   Hgb urine dipstick LARGE (A) NEGATIVE   Bilirubin Urine NEGATIVE NEGATIVE   Ketones, ur NEGATIVE NEGATIVE mg/dL   Protein, ur 100 (A) NEGATIVE mg/dL   Urobilinogen, UA 0.2 0.0 - 1.0 mg/dL   Nitrite NEGATIVE NEGATIVE    Leukocytes, UA MODERATE (A) NEGATIVE  Urine microscopic-add on     Status: Abnormal   Collection Time: 10/31/14  1:20 PM  Result Value Ref Range   Squamous Epithelial / LPF RARE RARE   WBC, UA TOO NUMEROUS TO COUNT <3 WBC/hpf   RBC / HPF 11-20 <3 RBC/hpf   Bacteria, UA MANY (A) RARE  Urine culture     Status: None (Preliminary result)   Collection Time: 10/31/14  1:20 PM  Result Value Ref Range   Specimen Description URINE, CATHETERIZED    Special Requests IMMUNE:COMPROMISED    Colony Count PENDING    Culture PENDING    Report Status PENDING   Lactic acid, plasma     Status: None   Collection Time: 10/31/14  2:29 PM  Result Value Ref Range   Lactic Acid, Venous 1.1 0.5 - 2.0 mmol/L  Lactic acid, plasma     Status: None   Collection Time: 10/31/14  4:59 PM  Result Value Ref Range   Lactic Acid, Venous 1.1 0.5 - 2.0 mmol/L  Basic metabolic panel     Status: Abnormal   Collection Time: 10/31/14  4:59 PM  Result Value Ref Range   Sodium 139 135 - 145 mmol/L   Potassium 6.5 (HH) 3.5 - 5.1 mmol/L    Comment: REPEATED TO VERIFY CRITICAL RESULT CALLED TO, READ BACK BY AND VERIFIED WITH: RAY,M AT 1728 ON 10/31/14 BY MOSLEY,J    Chloride 121 (H) 96 - 112 mmol/L   CO2 15 (L) 19 - 32 mmol/L   Glucose, Bld 44 (LL) 70 - 99 mg/dL    Comment: REPEATED TO VERIFY CRITICAL RESULT CALLED TO, READ BACK BY AND VERIFIED WITH: RAY,M AT 1728 ON 10/31/14 BY MOSLEY,J    BUN 78 (H) 6 - 23 mg/dL   Creatinine, Ser 5.05 (H) 0.50 - 1.35 mg/dL   Calcium 8.8 8.4 - 10.5 mg/dL   GFR calc non Af Amer 10 (L) >90 mL/min   GFR calc Af Amer 11 (L) >90 mL/min    Comment: (NOTE) The eGFR has been calculated using the CKD EPI equation. This calculation has not been validated in all clinical situations. eGFR's persistently <90 mL/min signify possible Chronic Kidney Disease.    Anion gap 3 (L) 5 - 15  Glucose,  capillary     Status: Abnormal   Collection Time: 10/31/14  5:35 PM  Result Value Ref Range    Glucose-Capillary 49 (L) 70 - 99 mg/dL  Glucose, capillary     Status: Abnormal   Collection Time: 10/31/14  6:01 PM  Result Value Ref Range   Glucose-Capillary 101 (H) 70 - 99 mg/dL  Glucose, capillary     Status: Abnormal   Collection Time: 10/31/14  9:34 PM  Result Value Ref Range   Glucose-Capillary 128 (H) 70 - 99 mg/dL   Comment 1 Notify RN   Basic metabolic panel     Status: Abnormal   Collection Time: 10/31/14 10:03 PM  Result Value Ref Range   Sodium 137 135 - 145 mmol/L    Comment: SLIGHT HEMOLYSIS   Potassium 7.1 (HH) 3.5 - 5.1 mmol/L    Comment: REPEATED TO VERIFY CRITICAL RESULT CALLED TO, READ BACK BY AND VERIFIED WITH: LEE,B AT 2319 ON 10/31/14 BY MOSLEY,J    Chloride 122 (H) 96 - 112 mmol/L   CO2 10 (LL) 19 - 32 mmol/L    Comment: REPEATED TO VERIFY CRITICAL RESULT CALLED TO, READ BACK BY AND VERIFIED WITH: LEE,B AT 2319 ON 10/31/14 BY MOSLEY,J    Glucose, Bld 123 (H) 70 - 99 mg/dL   BUN 75 (H) 6 - 23 mg/dL   Creatinine, Ser 4.87 (H) 0.50 - 1.35 mg/dL   Calcium 8.6 8.4 - 10.5 mg/dL   GFR calc non Af Amer 10 (L) >90 mL/min   GFR calc Af Amer 12 (L) >90 mL/min    Comment: (NOTE) The eGFR has been calculated using the CKD EPI equation. This calculation has not been validated in all clinical situations. eGFR's persistently <90 mL/min signify possible Chronic Kidney Disease.    Anion gap 5 5 - 15  Basic metabolic panel     Status: Abnormal   Collection Time: 11/01/14  7:32 AM  Result Value Ref Range   Sodium 139 135 - 145 mmol/L   Potassium 5.9 (H) 3.5 - 5.1 mmol/L    Comment: DELTA CHECK NOTED   Chloride 120 (H) 96 - 112 mmol/L   CO2 14 (L) 19 - 32 mmol/L   Glucose, Bld 160 (H) 70 - 99 mg/dL   BUN 74 (H) 6 - 23 mg/dL   Creatinine, Ser 4.80 (H) 0.50 - 1.35 mg/dL   Calcium 9.4 8.4 - 10.5 mg/dL   GFR calc non Af Amer 10 (L) >90 mL/min   GFR calc Af Amer 12 (L) >90 mL/min    Comment: (NOTE) The eGFR has been calculated using the CKD EPI equation. This  calculation has not been validated in all clinical situations. eGFR's persistently <90 mL/min signify possible Chronic Kidney Disease.    Anion gap 5 5 - 15  CBC     Status: Abnormal   Collection Time: 11/01/14  7:32 AM  Result Value Ref Range   WBC 6.9 4.0 - 10.5 K/uL   RBC 3.21 (L) 4.22 - 5.81 MIL/uL   Hemoglobin 9.2 (L) 13.0 - 17.0 g/dL   HCT 31.2 (L) 39.0 - 52.0 %   MCV 97.2 78.0 - 100.0 fL   MCH 28.7 26.0 - 34.0 pg   MCHC 29.5 (L) 30.0 - 36.0 g/dL   RDW 20.0 (H) 11.5 - 15.5 %   Platelets 268 150 - 400 K/uL  Glucose, capillary     Status: Abnormal   Collection Time: 11/01/14  7:35 AM  Result Value Ref Range  Glucose-Capillary 147 (H) 70 - 99 mg/dL   Comment 1 Notify RN   Blood gas, arterial     Status: Abnormal   Collection Time: 11/01/14 10:25 AM  Result Value Ref Range   FIO2 21.00 %   Delivery systems ROOM AIR    pH, Arterial 7.223 (L) 7.350 - 7.450   pCO2 arterial 30.4 (L) 35.0 - 45.0 mmHg   pO2, Arterial 96.3 80.0 - 100.0 mmHg   Bicarbonate 12.1 (L) 20.0 - 24.0 mEq/L   TCO2 11.1 0 - 100 mmol/L   Acid-base deficit 14.1 (H) 0.0 - 2.0 mmol/L   O2 Saturation 96.4 %   Patient temperature 37.0    Collection site LEFT RADIAL    Drawn by [id number]    Sample type ARTERIAL    Allens test (pass/fail) PASS PASS  Glucose, capillary     Status: Abnormal   Collection Time: 11/01/14 11:32 AM  Result Value Ref Range   Glucose-Capillary 126 (H) 70 - 99 mg/dL   Comment 1 Notify RN   Glucose, capillary     Status: Abnormal   Collection Time: 11/01/14  4:33 PM  Result Value Ref Range   Glucose-Capillary 111 (H) 70 - 99 mg/dL   Comment 1 Notify RN   Glucose, capillary     Status: Abnormal   Collection Time: 11/01/14  9:07 PM  Result Value Ref Range   Glucose-Capillary 170 (H) 70 - 99 mg/dL   Comment 1 Notify RN   Basic metabolic panel     Status: Abnormal   Collection Time: 11/02/14  5:39 AM  Result Value Ref Range   Sodium 138 135 - 145 mmol/L   Potassium 5.5 (H) 3.5 - 5.1  mmol/L   Chloride 113 (H) 96 - 112 mmol/L   CO2 18 (L) 19 - 32 mmol/L   Glucose, Bld 173 (H) 70 - 99 mg/dL   BUN 70 (H) 6 - 23 mg/dL   Creatinine, Ser 4.74 (H) 0.50 - 1.35 mg/dL   Calcium 8.7 8.4 - 10.5 mg/dL   GFR calc non Af Amer 11 (L) >90 mL/min   GFR calc Af Amer 12 (L) >90 mL/min    Comment: (NOTE) The eGFR has been calculated using the CKD EPI equation. This calculation has not been validated in all clinical situations. eGFR's persistently <90 mL/min signify possible Chronic Kidney Disease.    Anion gap 7 5 - 15    ABGS  Recent Labs  11/01/14 1025  PHART 7.223*  PO2ART 96.3  TCO2 11.1  HCO3 12.1*   CULTURES Recent Results (from the past 240 hour(s))  Urine culture     Status: None (Preliminary result)   Collection Time: 10/31/14  1:20 PM  Result Value Ref Range Status   Specimen Description URINE, CATHETERIZED  Final   Special Requests IMMUNE:COMPROMISED  Final   Colony Count PENDING  Incomplete   Culture PENDING  Incomplete   Report Status PENDING  Incomplete   Studies/Results: US Renal  11/01/2014   CLINICAL DATA:  Acute renal failure  EXAM: RENAL/URINARY TRACT ULTRASOUND COMPLETE  COMPARISON:  10/05/2014  FINDINGS: Right Kidney:  Length: 10.9 cm. Slight cortical thinning and slightly increased echotexture throughout the right kidney. No focal mass or hydronephrosis.  Left Kidney:  Length: 10.5 cm. Mild cortical thinning with increased echotexture. No mass or hydronephrosis.  Bladder:  Decompressed, Foley catheter in place.  IMPRESSION: Cortical thinning and increased echotexture in the kidneys, left greater than right. Findings suggestive of chronic medical renal  disease. No hydronephrosis.   Electronically Signed   By: Rolm Baptise M.D.   On: 11/01/2014 11:46   Dg Chest Port 1 View  10/31/2014   CLINICAL DATA:  81 year old male presenting with hyperkalemia. Recent history of colonic surgery.  EXAM: PORTABLE CHEST - 1 VIEW  COMPARISON:  Chest x-ray 10/10/2014.   FINDINGS: Lung volumes are slightly low. Film is slightly underpenetrated, limiting the diagnostic sensitivity and specificity of this examination. With these limitations in mind, there is no definite acute consolidative airspace disease, and no pleural effusions. No evidence of pulmonary edema. Heart size is mildly enlarged. The patient is rotated to the left on today's exam, resulting in distortion of the mediastinal contours and reduced diagnostic sensitivity and specificity for mediastinal pathology.  IMPRESSION: 1. Low lung volumes without radiographic evidence of acute cardiopulmonary disease.   Electronically Signed   By: Vinnie Langton M.D.   On: 10/31/2014 13:28    Medications:  Prior to Admission:  Prescriptions prior to admission  Medication Sig Dispense Refill Last Dose  . amiodarone (PACERONE) 200 MG tablet Take 1 tablet (200 mg total) by mouth 2 (two) times daily.   10/31/2014  . atorvastatin (LIPITOR) 20 MG tablet Take 20 mg by mouth daily.   10/30/2014  . Balsam [place]-Castor Oil (VENELEX) OINT Apply 1 application topically 3 (three) times daily.   10/31/2014  . enoxaparin (LOVENOX) 100 MG/ML injection Inject 0.95 mLs (95 mg total) into the skin daily. 0 Syringe  10/31/2014  . levothyroxine (SYNTHROID, LEVOTHROID) 125 MCG tablet Take 125 mcg by mouth daily before breakfast.   10/31/2014  . Multiple Vitamins-Minerals (CENTRUM SILVER PO) Take 1 tablet by mouth daily.   10/31/2014  . mupirocin ointment (BACTROBAN) 2 % Place 1 application into the nose 2 (two) times daily.   10/31/2014  . omeprazole (PRILOSEC) 20 MG capsule Take 20 mg by mouth daily.   10/31/2014  . sertraline (ZOLOFT) 50 MG tablet Take 50 mg by mouth daily.   10/31/2014  . acetaminophen (TYLENOL) 325 MG tablet Take 1-2 tablets (325-650 mg total) by mouth every 6 (six) hours as needed for fever, headache, mild pain or moderate pain.   Unknown  . loperamide (IMODIUM) 2 MG capsule Take 1 capsule (2 mg total) by mouth as needed for  diarrhea or loose stools. 30 capsule 0 Unknown   Scheduled: . amiodarone  200 mg Oral BID  . antiseptic oral rinse  7 mL Mouth Rinse BID  . atorvastatin  20 mg Oral QPC supper  . enoxaparin  85 mg Subcutaneous Q24H  . furosemide  100 mg Intravenous BID  . insulin aspart  0-5 Units Subcutaneous QHS  . insulin aspart  0-9 Units Subcutaneous TID WC  . insulin aspart  3 Units Subcutaneous TID WC  . levothyroxine  125 mcg Oral QAC breakfast  . multivitamin with minerals  1 tablet Oral Daily  . pantoprazole  40 mg Oral Daily  . sertraline  50 mg Oral Daily   Continuous: . sodium chloride    .  sodium bicarbonate  infusion 1000 mL 125 mL/hr at 11/02/14 0400   [facility]:[id number] **OR** acetaminophen, ondansetron **OR** ondansetron (ZOFRAN) IV, senna-docusate  Assesment: He was admitted with acute on chronic renal failure. He is about the same. His potassium level is improved. In addition to his current problem he has colon cancer which is metastatic. He has a colostomy. He has chronic combined systolic and diastolic heart failure which seems pretty stable. He has chronic atrial  fibrillation and is chronically anticoagulated. Principal Problem:   Hyperkalemia Active Problems:   ARF (acute renal failure)   CKD (chronic kidney disease) stage 4, GFR 15-29 ml/min   Chronic combined systolic and diastolic CHF (congestive heart failure)   Colon cancer    Plan: Continue current treatments. I discussed the fact that if he doesn't improve we may have to think about dialysis with his wife and she does not want to proceed with that at least now.     LOS: 2 days   Goodwin Kamphaus L 11/02/2014, 8:42 AM

## 2014-11-03 LAB — URINE CULTURE: Colony Count: >=100000

## 2014-11-03 LAB — GLUCOSE, CAPILLARY
GLUCOSE-CAPILLARY: 148 mg/dL — AB (ref 70–99)
Glucose-Capillary: 140 mg/dL — ABNORMAL HIGH (ref 70–99)
Glucose-Capillary: 115 mg/dL — ABNORMAL HIGH (ref 70–99)

## 2014-11-03 LAB — BASIC METABOLIC PANEL
Anion gap: 7 (ref 5–15)
BUN: 70 mg/dL — ABNORMAL HIGH (ref 6–23)
CHLORIDE: 102 mmol/L (ref 96–112)
CO2: 23 mmol/L (ref 19–32)
Calcium: 8.3 mg/dL — ABNORMAL LOW (ref 8.4–10.5)
Creatinine, Ser: 4.62 mg/dL — ABNORMAL HIGH (ref 0.50–1.35)
GFR calc Af Amer: 13 mL/min — ABNORMAL LOW (ref >90)
GFR calc non Af Amer: 11 mL/min — ABNORMAL LOW (ref >90)
GLUCOSE: 139 mg/dL — AB (ref 70–99)
POTASSIUM: 4.6 mmol/L (ref 3.5–5.1)
SODIUM: 132 mmol/L — AB (ref 135–145)

## 2014-11-03 LAB — HEPARIN ANTI-XA: Heparin LMW: 1.14 IU/mL

## 2014-11-03 MED ORDER — AMOXICILLIN 250 MG PO CAPS
250.0000 mg | ORAL_CAPSULE | Freq: Two times a day (BID) | ORAL | Status: DC
  Administered 2014-11-03 – 2014-11-08 (×11): 250 mg via ORAL
  Filled 2014-11-03 (×17): qty 1

## 2014-11-03 MED ORDER — ENOXAPARIN SODIUM 100 MG/ML ~~LOC~~ SOLN
60.0000 mg | SUBCUTANEOUS | Status: DC
  Administered 2014-11-04 – 2014-11-08 (×5): 60 mg via SUBCUTANEOUS
  Filled 2014-11-03 (×5): qty 1

## 2014-11-03 NOTE — Progress Notes (Signed)
33 yoM with acute on chronic renal failure who continues on therapeutic Lovenox for Afib, hx DVT.   4hr antiXa level today = 1.14 (goal 0.6-1.0).  No bleeding noted.  Discussed with Dr Luan Pulling.    Plan: Decrease Lovenox 60mg  sq q24h  Dr Luan Pulling to follow-up with cardiology to determine if patient Coumadin candidate.   F/U plan

## 2014-11-03 NOTE — Progress Notes (Signed)
Greg [name]  MRN: [id number]  DOB/AGE: 1931-11-18 81 y.o.  Primary Care Physician:HAWKINS,EDWARD L, MD  Admit date: 10/31/2014  Chief Complaint:  Chief Complaint  Patient presents with  . Abnormal Lab    S-Pt presented on  10/31/2014 with  Chief Complaint  Patient presents with  . Abnormal Lab  .    Pt offers no new complaints     Meds  . amiodarone  200 mg Oral BID  . antiseptic oral rinse  7 mL Mouth Rinse BID  . atorvastatin  20 mg Oral QPC supper  . enoxaparin  85 mg Subcutaneous Q24H  . furosemide  100 mg Intravenous BID  . insulin aspart  0-5 Units Subcutaneous QHS  . insulin aspart  0-9 Units Subcutaneous TID WC  . insulin aspart  3 Units Subcutaneous TID WC  . levothyroxine  125 mcg Oral QAC breakfast  . multivitamin with minerals  1 tablet Oral Daily  . pantoprazole  40 mg Oral Daily  . sertraline  50 mg Oral Daily       Physical Exam: Vital signs in last 24 hours: Temp:  [97.4 F (36.3 C)-98.4 F (36.9 C)] 98 F (36.7 C) (03/16 0815) Pulse Rate:  [33-77] 55 (03/16 0600) Resp:  [11-22] 15 (03/16 0600) BP: (91-119)/(33-60) 115/49 mmHg (03/16 0600) SpO2:  [94 %-100 %] 98 % (03/16 0600) Weight:  [182 lb 5.1 oz (82.7 kg)] 182 lb 5.1 oz (82.7 kg) (03/16 0500) Weight change: 3.5 oz (0.1 kg) Last BM Date: 11/02/14  Intake/Output from previous day: 03/15 0701 - 03/16 0700 In: 3300 [I.V.:3250; IV Piggyback:50] Out: 1925 [Urine:1050; Stool:875]     Physical Exam: General- pt is awake,alert, oriented to time place and person Resp- No acute REsp distress, Decreased bs at bases. CVS- S1S2 regular in rate and rhythm GIT- BS+, soft, NT, ND, Bag in situ EXT- NO LE Edema, Cyanosis   Lab Results: CBC  Recent Labs  10/31/14 1241 11/01/14 0732  WBC 8.2 6.9  HGB 9.3* 9.2*  HCT 31.0* 31.2*  PLT 282 268    BMET  Recent Labs  11/02/14 0539 11/03/14 0751  NA 138 132*  K 5.5* 4.6  CL 113* 102  CO2 18* 23  GLUCOSE 173* 139*  BUN 70* 70*   CREATININE 4.74* 4.62*  CALCIUM 8.7 8.3*   Creat trend 2016 5.0=> 4.8=>4.74=>4.62     2.5--3.2( February admission) 2015  1.7--2.6 2014 1.95 2012 1.8 2011 1.6--2.1 2009 1.6  Potassium 2016 7.5=>5.5=>4.6   MICRO Recent Results (from the past 240 hour(s))  Urine culture     Status: None (Preliminary result)   Collection Time: 10/31/14  1:20 PM  Result Value Ref Range Status   Specimen Description URINE, CATHETERIZED  Final   Special Requests IMMUNE:COMPROMISED  Final   Colony Count   Final    >=100,000 COLONIES/ML Performed at [place]   Final    ENTEROCOCCUS SPECIES Performed at [facility]    Report Status PENDING  Incomplete      Lab Results  Component Value Date   PTH 186.4* 09/04/2013   CALCIUM 8.3* 11/03/2014   CAION 1.19 10/08/2014   PHOS 4.8* 10/12/2014         Impression: 1)Renal  AKI secondary to Hypovolemia                AKI sec to Prerenal/ATN               AKi  improving               AKI on CKD               CKD stage 4.               CKD since 2009               CKD secondary to DM/ Post  Renal/Age associated decline                Progression of CKD marked with AKI                  2)HTN Medication-  On Diuretics  3)Anemia HGb at goal (9--11)   4)CKD Mineral-Bone Disorder PTH acceptable . Secondary Hyperparathyroidism present. Phosphorus at goal. Calcium at goal.  5)GI- hx of Colon Adenocarcinoma  s/p Colectomy and s/p Colostomy  Primary MD following  6)Electrolytes Hyperkalemic Improving  Hyponatremic   7)Acid base Co2 not at goal On IV bicarb 14=>18=>23 improving    Plan:  Will reduce IVf rate as Creat better     Analina Filla S 11/03/2014, 10:13 AM

## 2014-11-03 NOTE — Progress Notes (Signed)
Subjective: He says he feels better. He has no new complaints. His breathing is pretty good. No pain.  Objective: Vital signs in last 24 hours: Temp:  [97.4 F (36.3 C)-98.4 F (36.9 C)] 98.4 F (36.9 C) (03/16 0400) Pulse Rate:  [33-77] 55 (03/16 0600) Resp:  [10-22] 15 (03/16 0600) BP: (91-119)/(33-60) 115/49 mmHg (03/16 0600) SpO2:  [94 %-100 %] 98 % (03/16 0600) Weight:  [82.7 kg (182 lb 5.1 oz)] 82.7 kg (182 lb 5.1 oz) (03/16 0500) Weight change: 0.1 kg (3.5 oz) Last BM Date: 11/02/14  Intake/Output from previous day: 03/15 0701 - 03/16 0700 In: 3300 [I.V.:3250; IV Piggyback:50] Out: 1925 [Urine:1050; Stool:875]  PHYSICAL EXAM General appearance: alert, cooperative and no distress Resp: rhonchi bilaterally Cardio: irregularly irregular rhythm GI: soft, non-tender; bowel sounds normal; no masses,  no organomegaly Extremities: extremities normal, atraumatic, no cyanosis or edema  Lab Results:  Results for orders placed or performed during the hospital encounter of 10/31/14 (from the past 48 hour(s))  Blood gas, arterial     Status: Abnormal   Collection Time: 11/01/14 10:25 AM  Result Value Ref Range   FIO2 21.00 %   Delivery systems ROOM AIR    pH, Arterial 7.223 (L) 7.350 - 7.450   pCO2 arterial 30.4 (L) 35.0 - 45.0 mmHg   pO2, Arterial 96.3 80.0 - 100.0 mmHg   Bicarbonate 12.1 (L) 20.0 - 24.0 mEq/L   TCO2 11.1 0 - 100 mmol/L   Acid-base deficit 14.1 (H) 0.0 - 2.0 mmol/L   O2 Saturation 96.4 %   Patient temperature 37.0    Collection site LEFT RADIAL    Drawn by [id number]    Sample type ARTERIAL    Allens test (pass/fail) PASS PASS  Glucose, capillary     Status: Abnormal   Collection Time: 11/01/14 11:32 AM  Result Value Ref Range   Glucose-Capillary 126 (H) 70 - 99 mg/dL   Comment 1 Notify RN   Glucose, capillary     Status: Abnormal   Collection Time: 11/01/14  4:33 PM  Result Value Ref Range   Glucose-Capillary 111 (H) 70 - 99 mg/dL   Comment 1 Notify  RN   Glucose, capillary     Status: Abnormal   Collection Time: 11/01/14  9:07 PM  Result Value Ref Range   Glucose-Capillary 170 (H) 70 - 99 mg/dL   Comment 1 Notify RN   Basic metabolic panel     Status: Abnormal   Collection Time: 11/02/14  5:39 AM  Result Value Ref Range   Sodium 138 135 - 145 mmol/L   Potassium 5.5 (H) 3.5 - 5.1 mmol/L   Chloride 113 (H) 96 - 112 mmol/L   CO2 18 (L) 19 - 32 mmol/L   Glucose, Bld 173 (H) 70 - 99 mg/dL   BUN 70 (H) 6 - 23 mg/dL   Creatinine, Ser 4.74 (H) 0.50 - 1.35 mg/dL   Calcium 8.7 8.4 - 10.5 mg/dL   GFR calc non Af Amer 11 (L) >90 mL/min   GFR calc Af Amer 12 (L) >90 mL/min    Comment: (NOTE) The eGFR has been calculated using the CKD EPI equation. This calculation has not been validated in all clinical situations. eGFR's persistently <90 mL/min signify possible Chronic Kidney Disease.    Anion gap 7 5 - 15  Glucose, capillary     Status: Abnormal   Collection Time: 11/02/14  7:36 AM  Result Value Ref Range   Glucose-Capillary 161 (H) 70 -  99 mg/dL   Comment 1 Notify RN   Glucose, capillary     Status: Abnormal   Collection Time: 11/02/14 11:10 AM  Result Value Ref Range   Glucose-Capillary 194 (H) 70 - 99 mg/dL   Comment 1 Notify RN   Glucose, capillary     Status: None   Collection Time: 11/02/14  4:34 PM  Result Value Ref Range   Glucose-Capillary 77 70 - 99 mg/dL   Comment 1 Notify RN   Glucose, capillary     Status: Abnormal   Collection Time: 11/02/14  9:13 PM  Result Value Ref Range   Glucose-Capillary 234 (H) 70 - 99 mg/dL   Comment 1 Notify RN     ABGS  Recent Labs  11/01/14 1025  PHART 7.223*  PO2ART 96.3  TCO2 11.1  HCO3 12.1*   CULTURES Recent Results (from the past 240 hour(s))  Urine culture     Status: None (Preliminary result)   Collection Time: 10/31/14  1:20 PM  Result Value Ref Range Status   Specimen Description URINE, CATHETERIZED  Final   Special Requests IMMUNE:COMPROMISED  Final   Colony  Count   Final    >=100,000 COLONIES/ML Performed at [place]   Final    ENTEROCOCCUS SPECIES Performed at [facility]    Report Status PENDING  Incomplete   Studies/Results: US Renal  11/01/2014   CLINICAL DATA:  Acute renal failure  EXAM: RENAL/URINARY TRACT ULTRASOUND COMPLETE  COMPARISON:  10/05/2014  FINDINGS: Right Kidney:  Length: 10.9 cm. Slight cortical thinning and slightly increased echotexture throughout the right kidney. No focal mass or hydronephrosis.  Left Kidney:  Length: 10.5 cm. Mild cortical thinning with increased echotexture. No mass or hydronephrosis.  Bladder:  Decompressed, Foley catheter in place.  IMPRESSION: Cortical thinning and increased echotexture in the kidneys, left greater than right. Findings suggestive of chronic medical renal disease. No hydronephrosis.   Electronically Signed   By: Rolm Baptise M.D.   On: 11/01/2014 11:46    Medications:  Prior to Admission:  Prescriptions prior to admission  Medication Sig Dispense Refill Last Dose  . amiodarone (PACERONE) 200 MG tablet Take 1 tablet (200 mg total) by mouth 2 (two) times daily.   10/31/2014  . atorvastatin (LIPITOR) 20 MG tablet Take 20 mg by mouth daily.   10/30/2014  . Balsam [place]-Castor Oil (VENELEX) OINT Apply 1 application topically 3 (three) times daily.   10/31/2014  . enoxaparin (LOVENOX) 100 MG/ML injection Inject 0.95 mLs (95 mg total) into the skin daily. 0 Syringe  10/31/2014  . levothyroxine (SYNTHROID, LEVOTHROID) 125 MCG tablet Take 125 mcg by mouth daily before breakfast.   10/31/2014  . Multiple Vitamins-Minerals (CENTRUM SILVER PO) Take 1 tablet by mouth daily.   10/31/2014  . mupirocin ointment (BACTROBAN) 2 % Place 1 application into the nose 2 (two) times daily.   10/31/2014  . omeprazole (PRILOSEC) 20 MG capsule Take 20 mg by mouth daily.   10/31/2014  . sertraline (ZOLOFT) 50 MG tablet Take 50 mg by mouth daily.   10/31/2014  . acetaminophen (TYLENOL) 325  MG tablet Take 1-2 tablets (325-650 mg total) by mouth every 6 (six) hours as needed for fever, headache, mild pain or moderate pain.   Unknown  . loperamide (IMODIUM) 2 MG capsule Take 1 capsule (2 mg total) by mouth as needed for diarrhea or loose stools. 30 capsule 0 Unknown   Scheduled: . amiodarone  200 mg  Oral BID  . antiseptic oral rinse  7 mL Mouth Rinse BID  . atorvastatin  20 mg Oral QPC supper  . enoxaparin  85 mg Subcutaneous Q24H  . furosemide  100 mg Intravenous BID  . insulin aspart  0-5 Units Subcutaneous QHS  . insulin aspart  0-9 Units Subcutaneous TID WC  . insulin aspart  3 Units Subcutaneous TID WC  . levothyroxine  125 mcg Oral QAC breakfast  . multivitamin with minerals  1 tablet Oral Daily  . pantoprazole  40 mg Oral Daily  . sertraline  50 mg Oral Daily   Continuous: . sodium chloride    .  sodium bicarbonate  infusion 1000 mL 125 mL/hr at 11/03/14 0600   PPJ:[id number] **OR** acetaminophen, ondansetron **OR** ondansetron (ZOFRAN) IV, senna-docusate  Assesment: He was admitted with acute on chronic renal failure and hyperkalemia. He has multiple other medical problems including chronic combined systolic and diastolic heart failure and that seems pretty stable. He has colon cancer which is metastatic and he has a diverting colostomy which is functioning well. Principal Problem:   Hyperkalemia Active Problems:   ARF (acute renal failure)   CKD (chronic kidney disease) stage 4, GFR 15-29 ml/min   Chronic combined systolic and diastolic CHF (congestive heart failure)   Colon cancer    Plan: Continue current treatment. No new medications. His lab work is pending this morning    LOS: 3 days   Karisma Meiser L 11/03/2014, 7:43 AM

## 2014-11-04 ENCOUNTER — Telehealth: Payer: Self-pay | Admitting: Gastroenterology

## 2014-11-04 ENCOUNTER — Ambulatory Visit: Payer: Medicare Other | Admitting: Nurse Practitioner

## 2014-11-04 ENCOUNTER — Encounter: Payer: Self-pay | Admitting: Nurse Practitioner

## 2014-11-04 LAB — BASIC METABOLIC PANEL
Anion gap: 8 (ref 5–15)
BUN: 70 mg/dL — AB (ref 6–23)
CO2: 22 mmol/L (ref 19–32)
Calcium: 8.2 mg/dL — ABNORMAL LOW (ref 8.4–10.5)
Chloride: 103 mmol/L (ref 96–112)
Creatinine, Ser: 4.7 mg/dL — ABNORMAL HIGH (ref 0.50–1.35)
GFR, EST AFRICAN AMERICAN: 12 mL/min — AB (ref >90)
GFR, EST NON AFRICAN AMERICAN: 11 mL/min — AB (ref >90)
GLUCOSE: 148 mg/dL — AB (ref 70–99)
POTASSIUM: 4.4 mmol/L (ref 3.5–5.1)
Sodium: 133 mmol/L — ABNORMAL LOW (ref 135–145)

## 2014-11-04 LAB — GLUCOSE, CAPILLARY
Glucose-Capillary: 132 mg/dL — ABNORMAL HIGH (ref 70–99)
Glucose-Capillary: 146 mg/dL — ABNORMAL HIGH (ref 70–99)
Glucose-Capillary: 173 mg/dL — ABNORMAL HIGH (ref 70–99)

## 2014-11-04 LAB — BRAIN NATRIURETIC PEPTIDE: B NATRIURETIC PEPTIDE 5: 160 pg/mL — AB (ref 0.0–100.0)

## 2014-11-04 MED ORDER — DOBUTAMINE IN D5W 4-5 MG/ML-% IV SOLN
2.5000 ug/kg/min | INTRAVENOUS | Status: DC
  Administered 2014-11-04: 2.5 ug/kg/min via INTRAVENOUS
  Filled 2014-11-04: qty 250

## 2014-11-04 MED ORDER — AMIODARONE HCL 200 MG PO TABS
200.0000 mg | ORAL_TABLET | Freq: Every day | ORAL | Status: DC
  Administered 2014-11-04 – 2014-11-08 (×5): 200 mg via ORAL
  Filled 2014-11-04 (×5): qty 1

## 2014-11-04 NOTE — Progress Notes (Signed)
Subjective: Interval History: has no complaint of nausea or vomiting. Patient states that his breathing is much better. Presently he doesn't offer any complaints..  Objective: Vital signs in last 24 hours: Temp:  [97.7 F (36.5 C)-98.5 F (36.9 C)] 97.8 F (36.6 C) (03/17 0824) Pulse Rate:  [44-57] 55 (03/17 0600) Resp:  [13-18] 16 (03/17 0600) BP: (85-127)/(38-92) 113/47 mmHg (03/17 0600) SpO2:  [96 %-100 %] 96 % (03/17 0600) Weight change:   Intake/Output from previous day: 03/16 0701 - 03/17 0700 In: 2635.8 [I.V.:2515.8; IV Piggyback:120] Out: 3200 [Urine:1600; Stool:1600] Intake/Output this shift:    General appearance: alert, cooperative and no distress Resp: clear to auscultation bilaterally Cardio: regular rate and rhythm, S1, S2 normal, no murmur, click, rub or gallop GI: soft, non-tender; bowel sounds normal; no masses,  no organomegaly Extremities: extremities normal, atraumatic, no cyanosis or edema  Lab Results: No results for input(s): WBC, HGB, HCT, PLT in the last 72 hours. BMET:  Recent Labs  11/03/14 0751 11/04/14 0445  NA 132* 133*  K 4.6 4.4  CL 102 103  CO2 23 22  GLUCOSE 139* 148*  BUN 70* 70*  CREATININE 4.62* 4.70*  CALCIUM 8.3* 8.2*   No results for input(s): PTH in the last 72 hours. Iron Studies: No results for input(s): IRON, TIBC, TRANSFERRIN, FERRITIN in the last 72 hours.  Studies/Results: No results found.  I have reviewed the patient's current medications.  Assessment/Plan: Problem #1 hyperkalemia: His potassium is 4.4  has corrected.  problem #2 chronic renal failure :. His BUN and creatinine remains high. Presently seems to be late stage IV. Patient does not have any uremic signs and symptoms. Problem #3 history of his CHF: Presently on diuretics. Patient is nonoliguric and is asymptomatic. Problem #4 history of colon cancer Problem #5 anemia: His hemoglobin is below our target goal. Problem #6 metabolic bone disease: Calcium  is range. Problem #7 acid base balance: His CO2 is 22 within acceptable range. Plan: We'll decrease IV fluid to 75 mL per hour We'll check his basic metabolic panel, CBC and phosphorus in the morning. Patient and his wife at this moment don't consider hemodialysis as an option     LOS: 4 days   Priscilla Kirstein S 11/04/2014,9:13 AM

## 2014-11-04 NOTE — Progress Notes (Signed)
Subjective: He says he feels okay and has no new complaints. His breathing is okay.  Objective: Vital signs in last 24 hours: Temp:  [97.7 F (36.5 C)-98.5 F (36.9 C)] 97.9 F (36.6 C) (03/17 0412) Pulse Rate:  [44-59] 55 (03/17 0600) Resp:  [13-18] 16 (03/17 0600) BP: (85-127)/(38-92) 113/47 mmHg (03/17 0600) SpO2:  [95 %-100 %] 96 % (03/17 0600) Weight change:  Last BM Date: 11/03/14  Intake/Output from previous day: 03/16 0701 - 03/17 0700 In: 2635.8 [I.V.:2515.8; IV Piggyback:120] Out: 3200 [Urine:1600; Stool:1600]  PHYSICAL EXAM General appearance: alert, cooperative and no distress Resp: clear to auscultation bilaterally Cardio: irregularly irregular rhythm GI: soft, non-tender; bowel sounds normal; no masses,  no organomegaly Extremities: extremities normal, atraumatic, no cyanosis or edema  Lab Results:  Results for orders placed or performed during the hospital encounter of 10/31/14 (from the past 48 hour(s))  Glucose, capillary     Status: Abnormal   Collection Time: 11/02/14  7:36 AM  Result Value Ref Range   Glucose-Capillary 161 (H) 70 - 99 mg/dL   Comment 1 Notify RN   Glucose, capillary     Status: Abnormal   Collection Time: 11/02/14 11:10 AM  Result Value Ref Range   Glucose-Capillary 194 (H) 70 - 99 mg/dL   Comment 1 Notify RN   Glucose, capillary     Status: None   Collection Time: 11/02/14  4:34 PM  Result Value Ref Range   Glucose-Capillary 77 70 - 99 mg/dL   Comment 1 Notify RN   Glucose, capillary     Status: Abnormal   Collection Time: 11/02/14  9:13 PM  Result Value Ref Range   Glucose-Capillary 234 (H) 70 - 99 mg/dL   Comment 1 Notify RN   Glucose, capillary     Status: Abnormal   Collection Time: 11/03/14  7:37 AM  Result Value Ref Range   Glucose-Capillary 140 (H) 70 - 99 mg/dL   Comment 1 Notify RN    Comment 2 Document in Chart   Basic metabolic panel     Status: Abnormal   Collection Time: 11/03/14  7:51 AM  Result Value Ref  Range   Sodium 132 (L) 135 - 145 mmol/L   Potassium 4.6 3.5 - 5.1 mmol/L   Chloride 102 96 - 112 mmol/L    Comment: DELTA CHECK NOTED   CO2 23 19 - 32 mmol/L   Glucose, Bld 139 (H) 70 - 99 mg/dL   BUN 70 (H) 6 - 23 mg/dL   Creatinine, Ser 4.62 (H) 0.50 - 1.35 mg/dL   Calcium 8.3 (L) 8.4 - 10.5 mg/dL   GFR calc non Af Amer 11 (L) >90 mL/min   GFR calc Af Amer 13 (L) >90 mL/min    Comment: (NOTE) The eGFR has been calculated using the CKD EPI equation. This calculation has not been validated in all clinical situations. eGFR's persistently <90 mL/min signify possible Chronic Kidney Disease.    Anion gap 7 5 - 15  Glucose, capillary     Status: Abnormal   Collection Time: 11/03/14 12:31 PM  Result Value Ref Range   Glucose-Capillary 148 (H) 70 - 99 mg/dL   Comment 1 Notify RN    Comment 2 Document in Chart   Low molecular wgt heparin (fractionated)     Status: None   Collection Time: 11/03/14 12:52 PM  Result Value Ref Range   Heparin LMW 1.14 IU/mL    Comment:        THERAPEUTIC  RANGE: DVT,PE,ACS on LMWH 1 mg/kg q12 at 4 hrs = 0.5-1.2 units/mL. DVT,PE on LMWH 1.5 mg/kg q24 at 4 hrs = 1-2 units/mL.   Glucose, capillary     Status: Abnormal   Collection Time: 11/03/14  4:34 PM  Result Value Ref Range   Glucose-Capillary 115 (H) 70 - 99 mg/dL   Comment 1 Notify RN    Comment 2 Document in Chart   Basic metabolic panel     Status: Abnormal   Collection Time: 11/04/14  4:45 AM  Result Value Ref Range   Sodium 133 (L) 135 - 145 mmol/L   Potassium 4.4 3.5 - 5.1 mmol/L   Chloride 103 96 - 112 mmol/L   CO2 22 19 - 32 mmol/L   Glucose, Bld 148 (H) 70 - 99 mg/dL   BUN 70 (H) 6 - 23 mg/dL   Creatinine, Ser 4.70 (H) 0.50 - 1.35 mg/dL   Calcium 8.2 (L) 8.4 - 10.5 mg/dL   GFR calc non Af Amer 11 (L) >90 mL/min   GFR calc Af Amer 12 (L) >90 mL/min    Comment: (NOTE) The eGFR has been calculated using the CKD EPI equation. This calculation has not been validated in all clinical  situations. eGFR's persistently <90 mL/min signify possible Chronic Kidney Disease.    Anion gap 8 5 - 15    ABGS  Recent Labs  11/01/14 1025  PHART 7.223*  PO2ART 96.3  TCO2 11.1  HCO3 12.1*   CULTURES Recent Results (from the past 240 hour(s))  Urine culture     Status: None   Collection Time: 10/31/14  1:20 PM  Result Value Ref Range Status   Specimen Description URINE, CATHETERIZED  Final   Special Requests IMMUNE:COMPROMISED  Final   Colony Count   Final    >=100,000 COLONIES/ML Performed at [facility]    Culture   Final    ENTEROCOCCUS SPECIES Performed at [facility]    Report Status 11/03/2014 FINAL  Final   Organism ID, Bacteria ENTEROCOCCUS SPECIES  Final      Susceptibility   Enterococcus species - MIC*    AMPICILLIN <=2 SENSITIVE Sensitive     LEVOFLOXACIN 1 SENSITIVE Sensitive     NITROFURANTOIN <=16 SENSITIVE Sensitive     VANCOMYCIN 1 SENSITIVE Sensitive     TETRACYCLINE >=16 RESISTANT Resistant     * ENTEROCOCCUS SPECIES   Studies/Results: No results found.  Medications:  Prior to Admission:  Prescriptions prior to admission  Medication Sig Dispense Refill Last Dose  . amiodarone (PACERONE) 200 MG tablet Take 1 tablet (200 mg total) by mouth 2 (two) times daily.   10/31/2014  . atorvastatin (LIPITOR) 20 MG tablet Take 20 mg by mouth daily.   10/30/2014  . Balsam [place]-Castor Oil (VENELEX) OINT Apply 1 application topically 3 (three) times daily.   10/31/2014  . enoxaparin (LOVENOX) 100 MG/ML injection Inject 0.95 mLs (95 mg total) into the skin daily. 0 Syringe  10/31/2014  . levothyroxine (SYNTHROID, LEVOTHROID) 125 MCG tablet Take 125 mcg by mouth daily before breakfast.   10/31/2014  . Multiple Vitamins-Minerals (CENTRUM SILVER PO) Take 1 tablet by mouth daily.   10/31/2014  . mupirocin ointment (BACTROBAN) 2 % Place 1 application into the nose 2 (two) times daily.   10/31/2014  . omeprazole (PRILOSEC) 20 MG capsule Take 20 mg by  mouth daily.   10/31/2014  . sertraline (ZOLOFT) 50 MG tablet Take 50 mg by mouth daily.   10/31/2014  .  acetaminophen (TYLENOL) 325 MG tablet Take 1-2 tablets (325-650 mg total) by mouth every 6 (six) hours as needed for fever, headache, mild pain or moderate pain.   Unknown  . loperamide (IMODIUM) 2 MG capsule Take 1 capsule (2 mg total) by mouth as needed for diarrhea or loose stools. 30 capsule 0 Unknown   Scheduled: . amiodarone  200 mg Oral BID  . amoxicillin  250 mg Oral Q12H  . antiseptic oral rinse  7 mL Mouth Rinse BID  . atorvastatin  20 mg Oral QPC supper  . enoxaparin  60 mg Subcutaneous Q24H  . furosemide  100 mg Intravenous BID  . insulin aspart  0-5 Units Subcutaneous QHS  . insulin aspart  0-9 Units Subcutaneous TID WC  . insulin aspart  3 Units Subcutaneous TID WC  . levothyroxine  125 mcg Oral QAC breakfast  . multivitamin with minerals  1 tablet Oral Daily  . pantoprazole  40 mg Oral Daily  . sertraline  50 mg Oral Daily   Continuous: . sodium chloride 100 mL/hr at 11/04/14 0600  .  sodium bicarbonate  infusion 1000 mL Stopped (11/03/14 1522)   EYE:[id number] **OR** acetaminophen, ondansetron **OR** ondansetron (ZOFRAN) IV, senna-docusate  Assesment: He was admitted with acute on chronic renal failure and hyperkalemia. He has chronic combined systolic and diastolic heart failure but at this point doesn't seem to be having much trouble with that. He has colon cancer and has had a colostomy. His potassium is improved. His renal function has not improved. I discussed this yesterday with his wife and she does not want to consider dialysis at this time. Principal Problem:   Hyperkalemia Active Problems:   ARF (acute renal failure)   CKD (chronic kidney disease) stage 4, GFR 15-29 ml/min   Chronic combined systolic and diastolic CHF (congestive heart failure)   Colon cancer    Plan: Continue current treatments    LOS: 4 days   Greg [name] L 11/04/2014, 7:34  AM

## 2014-11-04 NOTE — Clinical Social Work Note (Signed)
CSW met with patient, wife was at bedside.  Mrs. Bostic indicated that she had held patient's bed for four days initially.  She indicated that she could no longer afford to hold it.  Mrs. Sheerin indicated that the doctor had advised her that patient would likely be in the hospital for three to five more days.  She stated that she was going to speak with [facility] today and discuss that she could no longer afford to hold patient's bed.  She indicated that if patient loss his bed at [facility] she would only want CSW to seek beds at [facility] or [facility].   Ambrose Pancoast, [place]

## 2014-11-04 NOTE — Telephone Encounter (Signed)
PATIENT WAS A NO SHOW 11/04/14 AND LETTER SENT

## 2014-11-04 NOTE — Care Management Utilization Note (Signed)
UR completed 

## 2014-11-04 NOTE — Consult Note (Addendum)
CARDIOLOGY CONSULT NOTE   Patient ID: Greg [name] MRN: [id number] DOB/AGE: [birth year] 79 y.o.  Admit Date: 10/31/2014 Referring Physician: Sinda Du MD Primary Physician: [name], MD Consulting Cardiologist: Carlyle Dolly MD Primary Cardiologist Carlyle Dolly MD Reason for Consultation: Atrial fib and question need to place on dobutamine and continue coumadin.   Clinical Summary Mr. Anguiano is a 81 y.o.male with complicated medical history, from cardiac standpoint, LBBB, NICM with EF of 67-67%, systolic CHF, cardiorenal syndrome with Stage III CKD., atrial fib with CHADS Score of 6, with upper extremity DVT. He was recently released from [facility] hospital due to high surgical risk, after he stabilized from cardiac standpoint after IV lasix and dobutamine gtt which significantly improved renal function and CHF. He was found to have a colonic mass with right colectomy and liver biopsy, and found to have hepatic flexure colonic adenocarcinoma on 09/28/2014. He had post-operative anemia and received blood transfusions.   Cardiology followed him during hospitalization. BB, ACE, and Diuretics were put on hold due to hypotension and CKD. GI recommended that he could restart coumadin in 2 weeks if no additional bleeding was seen. He was seen on follow up by Sherald Barge NP on 10/25/2014, continued on amiodarone for HR control but coumadin remained on hold as he has not seen GI at that time.   He was admitted on 10/31/2014 after sustaining a mechanical fall while recovering at the [facility] for PT. He was found to have UTI, Hgb of 9.3/Hct of 29, K+ of 7.0, Creatinine of 5.11, GFR of 11. CXR negative for CHF or pneumonia. EKG demonstrated atrial fib with LBBB.   Dr. Luan Pulling has asked [place] to see patient for recommendations on need to restart dobutamine to improve kidney fx and question restarting coumadin therapy. Creatinine is now 4.70.  Has been seen by nephrology and has been given  gentle hydration. Wife is refusing dialysis. DNR status has not been decided.    Allergies  Allergen Reactions  . Adhesive [Tape] Other (See Comments)    Pulls skin off.   Antoine Poche Oil Nausea And Vomiting    Medications Scheduled Medications: . amiodarone  200 mg Oral BID  . amoxicillin  250 mg Oral Q12H  . antiseptic oral rinse  7 mL Mouth Rinse BID  . atorvastatin  20 mg Oral QPC supper  . enoxaparin  60 mg Subcutaneous Q24H  . furosemide  100 mg Intravenous BID  . insulin aspart  0-5 Units Subcutaneous QHS  . insulin aspart  0-9 Units Subcutaneous TID WC  . insulin aspart  3 Units Subcutaneous TID WC  . levothyroxine  125 mcg Oral QAC breakfast  . multivitamin with minerals  1 tablet Oral Daily  . pantoprazole  40 mg Oral Daily  . sertraline  50 mg Oral Daily    Infusions: . sodium chloride 100 mL/hr at 11/04/14 0600  .  sodium bicarbonate  infusion 1000 mL Stopped (11/03/14 1522)    PRN Medications: acetaminophen **OR** acetaminophen, ondansetron **OR** ondansetron (ZOFRAN) IV, senna-docusate   Past Medical History  Diagnosis Date  . Type 2 diabetes mellitus   . Anemia of chronic disease   . CKD (chronic kidney disease) stage 3, GFR 30-59 ml/min   . Herpes zoster   . Obesity   . Exposure to hazardous substance   . Intermittent Leukopenia   . Essential hypertension   . Sleep apnea     Sleep Apnea score of 7  . Cardiomyopathy  LVEF 40-45% January 2015  . Left bundle Ivonna Kinnick block     Past Surgical History  Procedure Laterality Date  . Nasal polyp surgery    . Ventral hernia repair N/A 09/14/2013    Procedure: HERNIA REPAIR VENTRAL ADULT WITH MESH;  Surgeon: Jamesetta So, MD;  Location: [facility];  Service: General;  Laterality: N/A;  . Insertion of mesh N/A 09/14/2013    Procedure: INSERTION OF MESH;  Surgeon: Jamesetta So, MD;  Location: [facility];  Service: General;  Laterality: N/A;  . Colonoscopy N/A 10/04/2014    SLF:  1. Rectal bleeding /anemia due to  Mass most likely at the hepatic flexure and hemorroids. 2. Spott Tattoo placed at the distal aspect of mass. 3. Sever diverticulosis throughout the entire examined colon. 4. The left colonis redundant 5. Moderate siized internal hemorroids.   . Esophagogastroduodenoscopy N/A 10/04/2014    SLF: 1. Stricture at ehe gastroesophageal junction 2. Small hiatal hernia 3. Multiple small ulcers in the gastrtic antrum 4. Multiple ulcers in teh bulb and 2nd part of the duodenum 5. Anemia/ Melena due to th PUD/R colon mass  . Laparotomy N/A 10/08/2014    Procedure: Barrie Folk LAPAROTOMY;  Surgeon: Georganna Skeans, MD;  Location: [place];  Service: General;  Laterality: N/A;  . Partial colectomy N/A 10/08/2014    Procedure: PARTIAL COLECTOMY WITH ILEOSTOMY;  Surgeon: Georganna Skeans, MD;  Location: [facility];  Service: General;  Laterality: N/A;    Family History  Problem Relation Age of Onset  . Heart attack Brother     CABG  . Diabetes Brother   . Stroke Father   . Heart Problems Mother   . Colon cancer Neg Hx     Social History Mr. Kloepfer reports that he has quit smoking. His smoking use included Cigars. His smokeless tobacco use includes Chew. Mr. Merida reports that he does not drink alcohol.  Review of Systems Complete review of systems are found to be negative unless outlined in H&P above.  Physical Examination Blood pressure 113/47, pulse 55, temperature 97.8 F (36.6 C), temperature source Oral, resp. rate 16, height 5\' 11"  (1.803 m), weight 182 lb 5.1 oz (82.7 kg), SpO2 96 %.  Intake/Output Summary (Last 24 hours) at 11/04/14 0900 Last data filed at 11/04/14 0600  Gross per 24 hour  Intake 2575.84 ml  Output   3200 ml  Net -624.16 ml    Telemetry: Atrial fib rates in the 60's.  GEN: No acute distress  HEENT: Conjunctiva and lids normal, oropharynx clear with moist mucosa. Neck: Supple, no elevated JVP or carotid bruits, no thyromegaly. Lungs: Upper airway congestion, cleared with  cough. Cardiac: Iregular rate and rhythm, 1/6 systolic murmur, no pericardial rub. Abdomen: Soft, nontender, no hepatomegaly, bowel sounds present, no guarding or rebound. Extremities: No pitting edema, distal pulses 2+. Skin: Warm and dry. Musculoskeletal: No kyphosis. Neuropsychiatric: Alert and oriented x3, affect grossly appropriate.  Prior Cardiac Testing/Procedures   Echocardiogram 09/23/14: - Procedure narrative: Transthoracic echocardiography. Image quality was adequate. The study was technically difficult, as a result of poor patient compliance. - Left ventricle: The cavity size was normal. Systolic function was severely reduced. The estimated ejection fraction was in the range of 20% to 25%. The study was not technically sufficient to allow evaluation of LV diastolic dysfunction due to atrial fibrillation. Doppler parameters are consistent with high ventricular filling pressure. Mild posterior wall with severe asymmetric septal hypertrophy (2.16 cm). - Ventricular septum: Septal motion showed abnormal function and  dyssynergy. These changes are consistent with a left bundle Jakari Jacot block. - Aortic valve: Mildly calcified annulus. Trileaflet; mildly thickened leaflets. There was trivial regurgitation. - Mitral valve: Mildly thickened annulus. Mildly thickened leaflets . There was mild regurgitation. - Left atrium: The atrium was severely dilated. Volume/bsa, ES, (1-plane Simpson&'s, A2C): 42.6 ml/m^2. - Right ventricle: The cavity size was mildly dilated. Systolic function was mildly reduced. - Right atrium: The atrium was mildly to moderately dilated. - Tricuspid valve: There was mild regurgitation. - Pulmonary arteries: PA peak pressure: 50 mm Hg (S). Moderately elevated pulmonary pressures. - Systemic veins: IVC is dilated with normal respiratory variation. Estimated CVP 8 mmHg.  Lab Results  Basic Metabolic Panel:  Recent Labs Lab  10/31/14 2203 11/01/14 0732 11/02/14 0539 11/03/14 0751 11/04/14 0445  NA 137 139 138 132* 133*  K 7.1* 5.9* 5.5* 4.6 4.4  CL 122* 120* 113* 102 103  CO2 10* 14* 18* 23 22  GLUCOSE 123* 160* 173* 139* 148*  BUN 75* 74* 70* 70* 70*  CREATININE 4.87* 4.80* 4.74* 4.62* 4.70*  CALCIUM 8.6 9.4 8.7 8.3* 8.2*    Liver Function Tests:  Recent Labs Lab 10/29/14 0950 10/31/14 1241  AST 12 12  ALT 14 14  ALKPHOS 67 63  BILITOT 0.5 0.4  PROT 6.5 6.4  ALBUMIN 3.2* 2.9*    CBC:  Recent Labs Lab 10/31/14 1241 11/01/14 0732  WBC 8.2 6.9  NEUTROABS 4.9  --   HGB 9.3* 9.2*  HCT 31.0* 31.2*  MCV 96.9 97.2  PLT 282 268    No results found.   ECG: Atrial fib with RVR   Impression and Recommendations  1. Atrial fib: CHADS VASC Score of 6:  Due to severe deconditioning and recent fall, with hx of GIB, would not restart coumadin at this time. NOAC would not be a consideration with renal failure. Continue heart rate control with amiodarone but would decrease to 200 mg daily now as it has been 2 weeks since starting this medication. Continue lovenox per pharmacy.    2. Systolic Dysfunction with combined CHF: No evidence of fluid overload at this time. Cardiorenal syndrome is likely contributing to renal insufficiency. Creatinine has improved with IV fluids and lasix gtt.  3. Chronic LBBB  4. Anemia: Currently stable at 9.2/31.2   5. Diabetes: On sliding scale insulin per PCP.    Signed: [name]. Lawrence NP [place]  11/04/2014, 9:00 AM Co-Sign MD  Patient seen and discussed with NP Purcell Nails, I agree with her documentation above. 81 yo male hx of DM, CKD stage IV, hx of SBO, HTN, OSA, chronic systolic heart failure with LVEF 20-25%, hx of RUE DVT, anemia, colon mass with recent partial colectomy with pathology consistent with adenocarcinoma of colon admitted with weakness and mechanical  fall. Had several days prior of poor oral intake. Admit Cr 5, K 7. Fall occurred when he  last his balance trying to sit on his bed, no syncope or presyncope.  Recent admission 10/16/14 with heart failure diuresed 28 L, discharge weight 190 lbs, discharge Cr 2.88. Admitted with AKI 10/31/14 after 5 days of poor oral intake, admit Cr around 5 and BUN 80s, GFR 12. Admit weight 185 lbs, down to 182 lbs today. CXR without edema. No recent BNP in system, will add. Negative 476mL since admission. Has been receiving IVF and IV lasix managed by nephrology.    History of poor oral intake, decreasing weight (down 8 lbs since discharge 09/2014), clear CXR, no  cardiopulmonary symptoms, no hypervolemia by exam all support his AKI is more related to hypovolemia as opposed to worsening CHF. He is net negative 461mL since admission and weight continues to decrease (down to 182 lbs)supporting he remains hypovolemic and his fluid deficit has not been replaced. I would suggest stopping his lasix and continuing IVF to allow his volume status to improve prior to considering any inotropic support.   Regarding afib continue amiodarone, he has low pressures with beta blockers previously. EGD with notes small ulcers and colonscopy with colon mass that has since been removed. Was discharged 09/2014 on lovenox with plans to follow CBC and if stable consider oral anticoag. Hgb as been stable in low 9s since discharge. Would hold on coumadin at this time in case dialysis access is needed, however would be reasonable to start coumadin closer to discharge.   Zandra Abts MD

## 2014-11-05 DIAGNOSIS — I482 Chronic atrial fibrillation: Secondary | ICD-10-CM

## 2014-11-05 DIAGNOSIS — I519 Heart disease, unspecified: Secondary | ICD-10-CM

## 2014-11-05 DIAGNOSIS — I429 Cardiomyopathy, unspecified: Secondary | ICD-10-CM

## 2014-11-05 DIAGNOSIS — R710 Precipitous drop in hematocrit: Secondary | ICD-10-CM

## 2014-11-05 DIAGNOSIS — C189 Malignant neoplasm of colon, unspecified: Secondary | ICD-10-CM

## 2014-11-05 DIAGNOSIS — D509 Iron deficiency anemia, unspecified: Secondary | ICD-10-CM

## 2014-11-05 LAB — CBC
HEMATOCRIT: 25.8 % — AB (ref 39.0–52.0)
Hemoglobin: 8.2 g/dL — ABNORMAL LOW (ref 13.0–17.0)
MCH: 29.5 pg (ref 26.0–34.0)
MCHC: 31.8 g/dL (ref 30.0–36.0)
MCV: 92.8 fL (ref 78.0–100.0)
Platelets: 187 10*3/uL (ref 150–400)
RBC: 2.78 MIL/uL — ABNORMAL LOW (ref 4.22–5.81)
RDW: 19.4 % — AB (ref 11.5–15.5)
WBC: 6.3 10*3/uL (ref 4.0–10.5)

## 2014-11-05 LAB — BASIC METABOLIC PANEL
ANION GAP: 8 (ref 5–15)
BUN: 68 mg/dL — ABNORMAL HIGH (ref 6–23)
CALCIUM: 8.2 mg/dL — AB (ref 8.4–10.5)
CO2: 21 mmol/L (ref 19–32)
Chloride: 105 mmol/L (ref 96–112)
Creatinine, Ser: 4.76 mg/dL — ABNORMAL HIGH (ref 0.50–1.35)
GFR calc Af Amer: 12 mL/min — ABNORMAL LOW (ref >90)
GFR, EST NON AFRICAN AMERICAN: 10 mL/min — AB (ref >90)
GLUCOSE: 107 mg/dL — AB (ref 70–99)
Potassium: 4.2 mmol/L (ref 3.5–5.1)
SODIUM: 134 mmol/L — AB (ref 135–145)

## 2014-11-05 LAB — GLUCOSE, CAPILLARY
GLUCOSE-CAPILLARY: 112 mg/dL — AB (ref 70–99)
GLUCOSE-CAPILLARY: 105 mg/dL — AB (ref 70–99)
Glucose-Capillary: 75 mg/dL (ref 70–99)
Glucose-Capillary: 122 mg/dL — ABNORMAL HIGH (ref 70–99)
Glucose-Capillary: 126 mg/dL — ABNORMAL HIGH (ref 70–99)
Glucose-Capillary: 120 mg/dL — ABNORMAL HIGH (ref 70–99)

## 2014-11-05 LAB — PHOSPHORUS: Phosphorus: 5 mg/dL — ABNORMAL HIGH (ref 2.3–4.6)

## 2014-11-05 NOTE — Progress Notes (Signed)
Alert with periods of confusion. Vital signs are stable. Condom catheter in tact. IV patent. No complaints of any distress.  Report called to [employer]. Patient to be transferred via bed to department 300.

## 2014-11-05 NOTE — Telephone Encounter (Signed)
Noted  

## 2014-11-05 NOTE — Progress Notes (Signed)
Subjective: He says he feels okay and has no new complaints. He denies any chest pain. His breathing is okay. No abdominal pain.  Objective: Vital signs in last 24 hours: Temp:  [97.5 F (36.4 C)-98.3 F (36.8 C)] 97.9 F (36.6 C) (03/18 0500) Pulse Rate:  [50-112] 55 (03/18 0600) Resp:  [10-22] 15 (03/18 0600) BP: (95-126)/(41-86) 113/44 mmHg (03/18 0600) SpO2:  [94 %-100 %] 95 % (03/18 0600) Weight:  [83.7 kg (184 lb 8.4 oz)] 83.7 kg (184 lb 8.4 oz) (03/18 0500) Weight change:  Last BM Date: 11/04/14  Intake/Output from previous day: 03/17 0701 - 03/18 0700 In: 1971.5 [I.V.:1911.5; IV Piggyback:60] Out: 2700 [Urine:1325; Stool:1375]  PHYSICAL EXAM General appearance: alert, cooperative and no distress Resp: clear to auscultation bilaterally Cardio: irregularly irregular rhythm GI: Abdomen is soft his colostomy seems to be functioning well Extremities: extremities normal, atraumatic, no cyanosis or edema  Lab Results:  Results for orders placed or performed during the hospital encounter of 10/31/14 (from the past 48 hour(s))  Basic metabolic panel     Status: Abnormal   Collection Time: 11/03/14  7:51 AM  Result Value Ref Range   Sodium 132 (L) 135 - 145 mmol/L   Potassium 4.6 3.5 - 5.1 mmol/L   Chloride 102 96 - 112 mmol/L    Comment: DELTA CHECK NOTED   CO2 23 19 - 32 mmol/L   Glucose, Bld 139 (H) 70 - 99 mg/dL   BUN 70 (H) 6 - 23 mg/dL   Creatinine, Ser 4.62 (H) 0.50 - 1.35 mg/dL   Calcium 8.3 (L) 8.4 - 10.5 mg/dL   GFR calc non Af Amer 11 (L) >90 mL/min   GFR calc Af Amer 13 (L) >90 mL/min    Comment: (NOTE) The eGFR has been calculated using the CKD EPI equation. This calculation has not been validated in all clinical situations. eGFR's persistently <90 mL/min signify possible Chronic Kidney Disease.    Anion gap 7 5 - 15  Glucose, capillary     Status: Abnormal   Collection Time: 11/03/14 12:31 PM  Result Value Ref Range   Glucose-Capillary 148 (H) 70 -  99 mg/dL   Comment 1 Notify RN    Comment 2 Document in Chart   Low molecular wgt heparin (fractionated)     Status: None   Collection Time: 11/03/14 12:52 PM  Result Value Ref Range   Heparin LMW 1.14 IU/mL    Comment:        THERAPEUTIC RANGE: DVT,PE,ACS on LMWH 1 mg/kg q12 at 4 hrs = 0.5-1.2 units/mL. DVT,PE on LMWH 1.5 mg/kg q24 at 4 hrs = 1-2 units/mL.   Glucose, capillary     Status: Abnormal   Collection Time: 11/03/14  4:34 PM  Result Value Ref Range   Glucose-Capillary 115 (H) 70 - 99 mg/dL   Comment 1 Notify RN    Comment 2 Document in Chart   Glucose, capillary     Status: Abnormal   Collection Time: 11/03/14  9:21 PM  Result Value Ref Range   Glucose-Capillary 132 (H) 70 - 99 mg/dL   Comment 1 Notify RN   Basic metabolic panel     Status: Abnormal   Collection Time: 11/04/14  4:45 AM  Result Value Ref Range   Sodium 133 (L) 135 - 145 mmol/L   Potassium 4.4 3.5 - 5.1 mmol/L   Chloride 103 96 - 112 mmol/L   CO2 22 19 - 32 mmol/L   Glucose, Bld 148 (H)  70 - 99 mg/dL   BUN 70 (H) 6 - 23 mg/dL   Creatinine, Ser 4.70 (H) 0.50 - 1.35 mg/dL   Calcium 8.2 (L) 8.4 - 10.5 mg/dL   GFR calc non Af Amer 11 (L) >90 mL/min   GFR calc Af Amer 12 (L) >90 mL/min    Comment: (NOTE) The eGFR has been calculated using the CKD EPI equation. This calculation has not been validated in all clinical situations. eGFR's persistently <90 mL/min signify possible Chronic Kidney Disease.    Anion gap 8 5 - 15  Glucose, capillary     Status: Abnormal   Collection Time: 11/04/14  7:49 AM  Result Value Ref Range   Glucose-Capillary 146 (H) 70 - 99 mg/dL   Comment 1 Notify RN    Comment 2 Document in Chart   Glucose, capillary     Status: Abnormal   Collection Time: 11/04/14 12:05 PM  Result Value Ref Range   Glucose-Capillary 173 (H) 70 - 99 mg/dL   Comment 1 Notify RN    Comment 2 Document in Chart   Brain natriuretic peptide     Status: Abnormal   Collection Time: 11/04/14 12:59  PM  Result Value Ref Range   B Natriuretic Peptide 160.0 (H) 0.0 - 100.0 pg/mL  Phosphorus     Status: Abnormal   Collection Time: 11/05/14  4:41 AM  Result Value Ref Range   Phosphorus 5.0 (H) 2.3 - 4.6 mg/dL  Basic metabolic panel     Status: Abnormal   Collection Time: 11/05/14  4:41 AM  Result Value Ref Range   Sodium 134 (L) 135 - 145 mmol/L   Potassium 4.2 3.5 - 5.1 mmol/L   Chloride 105 96 - 112 mmol/L   CO2 21 19 - 32 mmol/L   Glucose, Bld 107 (H) 70 - 99 mg/dL   BUN 68 (H) 6 - 23 mg/dL   Creatinine, Ser 4.76 (H) 0.50 - 1.35 mg/dL   Calcium 8.2 (L) 8.4 - 10.5 mg/dL   GFR calc non Af Amer 10 (L) >90 mL/min   GFR calc Af Amer 12 (L) >90 mL/min    Comment: (NOTE) The eGFR has been calculated using the CKD EPI equation. This calculation has not been validated in all clinical situations. eGFR's persistently <90 mL/min signify possible Chronic Kidney Disease.    Anion gap 8 5 - 15  CBC     Status: Abnormal   Collection Time: 11/05/14  4:41 AM  Result Value Ref Range   WBC 6.3 4.0 - 10.5 K/uL   RBC 2.78 (L) 4.22 - 5.81 MIL/uL   Hemoglobin 8.2 (L) 13.0 - 17.0 g/dL   HCT 25.8 (L) 39.0 - 52.0 %   MCV 92.8 78.0 - 100.0 fL   MCH 29.5 26.0 - 34.0 pg   MCHC 31.8 30.0 - 36.0 g/dL   RDW 19.4 (H) 11.5 - 15.5 %   Platelets 187 150 - 400 K/uL    ABGS No results for input(s): PHART, PO2ART, TCO2, HCO3 in the last 72 hours.  Invalid input(s): PCO2 CULTURES Recent Results (from the past 240 hour(s))  Urine culture     Status: None   Collection Time: 10/31/14  1:20 PM  Result Value Ref Range Status   Specimen Description URINE, CATHETERIZED  Final   Special Requests IMMUNE:COMPROMISED  Final   Colony Count   Final    >=100,000 COLONIES/ML Performed at [place]   Final  ENTEROCOCCUS SPECIES Performed at [facility]    Report Status 11/03/2014 FINAL  Final   Organism ID, Bacteria ENTEROCOCCUS SPECIES  Final      Susceptibility    Enterococcus species - MIC*    AMPICILLIN <=2 SENSITIVE Sensitive     LEVOFLOXACIN 1 SENSITIVE Sensitive     NITROFURANTOIN <=16 SENSITIVE Sensitive     VANCOMYCIN 1 SENSITIVE Sensitive     TETRACYCLINE >=16 RESISTANT Resistant     * ENTEROCOCCUS SPECIES   Studies/Results: No results found.  Medications:  Prior to Admission:  Prescriptions prior to admission  Medication Sig Dispense Refill Last Dose  . amiodarone (PACERONE) 200 MG tablet Take 1 tablet (200 mg total) by mouth 2 (two) times daily.   10/31/2014  . atorvastatin (LIPITOR) 20 MG tablet Take 20 mg by mouth daily.   10/30/2014  . Balsam [place]-Castor Oil (VENELEX) OINT Apply 1 application topically 3 (three) times daily.   10/31/2014  . enoxaparin (LOVENOX) 100 MG/ML injection Inject 0.95 mLs (95 mg total) into the skin daily. 0 Syringe  10/31/2014  . levothyroxine (SYNTHROID, LEVOTHROID) 125 MCG tablet Take 125 mcg by mouth daily before breakfast.   10/31/2014  . Multiple Vitamins-Minerals (CENTRUM SILVER PO) Take 1 tablet by mouth daily.   10/31/2014  . mupirocin ointment (BACTROBAN) 2 % Place 1 application into the nose 2 (two) times daily.   10/31/2014  . omeprazole (PRILOSEC) 20 MG capsule Take 20 mg by mouth daily.   10/31/2014  . sertraline (ZOLOFT) 50 MG tablet Take 50 mg by mouth daily.   10/31/2014  . acetaminophen (TYLENOL) 325 MG tablet Take 1-2 tablets (325-650 mg total) by mouth every 6 (six) hours as needed for fever, headache, mild pain or moderate pain.   Unknown  . loperamide (IMODIUM) 2 MG capsule Take 1 capsule (2 mg total) by mouth as needed for diarrhea or loose stools. 30 capsule 0 Unknown   Scheduled: . amiodarone  200 mg Oral Daily  . amoxicillin  250 mg Oral Q12H  . antiseptic oral rinse  7 mL Mouth Rinse BID  . atorvastatin  20 mg Oral QPC supper  . enoxaparin  60 mg Subcutaneous Q24H  . insulin aspart  0-5 Units Subcutaneous QHS  . insulin aspart  0-9 Units Subcutaneous TID WC  . insulin aspart  3 Units  Subcutaneous TID WC  . levothyroxine  125 mcg Oral QAC breakfast  . multivitamin with minerals  1 tablet Oral Daily  . pantoprazole  40 mg Oral Daily  . sertraline  50 mg Oral Daily   Continuous: . sodium chloride 75 mL/hr at 11/05/14 0600   YWV:[id number] **OR** acetaminophen, ondansetron **OR** ondansetron (ZOFRAN) IV, senna-docusate  Assesment: He was admitted with acute renal failure on chronic renal failure. His renal function has not changed significantly. He is not oliguric and is making urine. Have chronic combined systolic and diastolic heart failure but that seems to be stable. Principal Problem:   Hyperkalemia Active Problems:   ARF (acute renal failure)   CKD (chronic kidney disease) stage 4, GFR 15-29 ml/min   Chronic combined systolic and diastolic CHF (congestive heart failure)   Colon cancer    Plan: I think he can move from the ICU today    LOS: 5 days   Arland Usery L 11/05/2014, 7:47 AM

## 2014-11-05 NOTE — Progress Notes (Signed)
Subjective: Interval History: patient offers no complaints. His appetite is improving and he does not have any nausea or vomiting. His by mouth intake however still seems to be inadequate. Objective: Vital signs in last 24 hours: Temp:  [97.5 F (36.4 C)-98.3 F (36.8 C)] 97.9 F (36.6 C) (03/18 0500) Pulse Rate:  [50-112] 55 (03/18 0600) Resp:  [10-22] 15 (03/18 0600) BP: (95-126)/(41-86) 113/44 mmHg (03/18 0600) SpO2:  [94 %-100 %] 95 % (03/18 0600) Weight:  [83.7 kg (184 lb 8.4 oz)] 83.7 kg (184 lb 8.4 oz) (03/18 0500) Weight change:   Intake/Output from previous day: 03/17 0701 - 03/18 0700 In: 1971.5 [I.V.:1911.5; IV Piggyback:60] Out: 2700 [Urine:1325; ATFTD:3220] Intake/Output this shift:    General appearance: alert, cooperative and no distress Resp: clear to auscultation bilaterally Cardio: regular rate and rhythm, S1, S2 normal, no murmur, click, rub or gallop GI: soft, non-tender; bowel sounds normal; no masses,  no organomegaly Extremities: extremities normal, atraumatic, no cyanosis or edema  Lab Results:  Recent Labs  11/05/14 0441  WBC 6.3  HGB 8.2*  HCT 25.8*  PLT 187   BMET:   Recent Labs  11/04/14 0445 11/05/14 0441  NA 133* 134*  K 4.4 4.2  CL 103 105  CO2 22 21  GLUCOSE 148* 107*  BUN 70* 68*  CREATININE 4.70* 4.76*  CALCIUM 8.2* 8.2*   No results for input(s): PTH in the last 72 hours. Iron Studies: No results for input(s): IRON, TIBC, TRANSFERRIN, FERRITIN in the last 72 hours.  Studies/Results: No results found.  I have reviewed the patient's current medications.  Assessment/Plan: Problem #1 hyperkalemia: His potassium is  Within normal range. problem #2 chronic renal failure :. His BUN and creatinine remains high. his renal function remains stable.. Problem #3 history of his CHF: Presently on diuretics. Patient is nonoliguric and is asymptomatic. patient had about 1300 mL of urine output. Presently patient denies any difficulty  breathing. Problem #4 history of colon cancer Problem #5 anemia: His hemoglobin is below our target goal. His hemoglobin has declined. Problem #6 metabolic bone disease: Calcium and his phosphorus is range. Patient presently is not on a binder. Problem #7 acid base balance: His CO2 is 21 within acceptable range. Plan: We'll continue with present management.  We'll check basic metabolic panel, CBC and phosphorus in the morning.      LOS: 5 days   Avanell Banwart S 11/05/2014,7:42 AM

## 2014-11-05 NOTE — Progress Notes (Signed)
Consulting cardiologist: Kate Sable MD Primary Cardiologist: Carlyle Dolly MD  Cardiology Specific Problem List:  1. Atrial fib 2. Systolic Dysfunction   Subjective:    No complaints. No dyspnea or pain.   Objective:   Temp:  [97.5 F (36.4 C)-98.3 F (36.8 C)] 97.9 F (36.6 C) (03/18 0500) Pulse Rate:  [50-112] 55 (03/18 0600) Resp:  [10-22] 15 (03/18 0600) BP: (95-126)/(41-86) 113/44 mmHg (03/18 0600) SpO2:  [94 %-100 %] 95 % (03/18 0600) Weight:  [184 lb 8.4 oz (83.7 kg)] 184 lb 8.4 oz (83.7 kg) (03/18 0500) Last BM Date: 11/04/14  Filed Weights   11/02/14 0500 11/03/14 0500 11/05/14 0500  Weight: 182 lb 1.6 oz (82.6 kg) 182 lb 5.1 oz (82.7 kg) 184 lb 8.4 oz (83.7 kg)    Intake/Output Summary (Last 24 hours) at 11/05/14 0909 Last data filed at 11/05/14 0846  Gross per 24 hour  Intake 1711.54 ml  Output   2900 ml  Net -1188.46 ml    Telemetry: NSR in the 60's.   Exam:  General: No acute distress.  HEENT: Conjunctiva and lids normal, oropharynx clear.  Lungs: Crackles in the LLL, clear on the right. No wheezes or coughing.   Cardiac: No elevated JVP or bruits. RRR distant heart sounds. , no gallop or rub.   Abdomen: Normoactive bowel sounds, nontender, nondistended.  Extremities: No pitting edema, distal pulses full.  Neuropsychiatric: Alert and oriented x3, affect appropriate.   Lab Results:  Basic Metabolic Panel:  Recent Labs Lab 11/03/14 0751 11/04/14 0445 11/05/14 0441  NA 132* 133* 134*  K 4.6 4.4 4.2  CL 102 103 105  CO2 23 22 21   GLUCOSE 139* 148* 107*  BUN 70* 70* 68*  CREATININE 4.62* 4.70* 4.76*  CALCIUM 8.3* 8.2* 8.2*    Liver Function Tests:  Recent Labs Lab 10/29/14 0950 10/31/14 1241  AST 12 12  ALT 14 14  ALKPHOS 67 63  BILITOT 0.5 0.4  PROT 6.5 6.4  ALBUMIN 3.2* 2.9*    CBC:  Recent Labs Lab 10/31/14 1241 11/01/14 0732 11/05/14 0441  WBC 8.2 6.9 6.3  HGB 9.3* 9.2* 8.2*  HCT 31.0* 31.2*  25.8*  MCV 96.9 97.2 92.8  PLT 282 268 187    Radiology: Chest X-Ray 10/31/2014 Lung volumes are slightly low. Film is slightly underpenetrated, limiting the diagnostic sensitivity and specificity of this examination. With these limitations in mind, there is no definite acute consolidative airspace disease, and no pleural effusions. No evidence of pulmonary edema. Heart size is mildly enlarged. The patient is rotated to the left on today's exam, resulting in distortion of the mediastinal contours and reduced diagnostic sensitivity and specificity for mediastinal pathology.  IMPRESSION: 1. Low lung volumes without radiographic evidence of acute cardiopulmonary disease.    Medications:   Scheduled Medications: . amiodarone  200 mg Oral Daily  . amoxicillin  250 mg Oral Q12H  . antiseptic oral rinse  7 mL Mouth Rinse BID  . atorvastatin  20 mg Oral QPC supper  . enoxaparin  60 mg Subcutaneous Q24H  . insulin aspart  0-5 Units Subcutaneous QHS  . insulin aspart  0-9 Units Subcutaneous TID WC  . insulin aspart  3 Units Subcutaneous TID WC  . levothyroxine  125 mcg Oral QAC breakfast  . multivitamin with minerals  1 tablet Oral Daily  . pantoprazole  40 mg Oral Daily  . sertraline  50 mg Oral Daily    Infusions: . sodium chloride 75  mL/hr at 11/05/14 0600    PRN Medications: acetaminophen **OR** acetaminophen, ondansetron **OR** ondansetron (ZOFRAN) IV, senna-docusate   Assessment and Plan:   1. Atrial fib with CHADS VASC Score of 6: Coumadin currently on hold due to falls and small ulcers per EDG, and anemia. He continues on LMWH. His Hgb is 8.2 this am compared to 9.2 on 11/01/2014. Continued on amiodarone, dose decreased to 200 mg daily yesterday.   2. Systolic Dysfunction: EF of 20-25%. No evidence of fluid overload. He had been on IV lasix and IV fluids per nephrology. The lasix was discontinued by Dr. Harl Bowie yesterday as he felt the patient was volume depleted due to  poor po intake and continued the IV fluids. Dobutamine was not recommended.   3.Renal failure: He is being followed by Dr. Hinda Lenis, Creatinine is worsened only slightly this am from 4.70 to 4.76. CO 2 21.  He has diuresed 1.3 liters since admission. He is eating and drinking well.   4. Anemia: Hgb down one gm this am from prior level of 9,2 on 3.14/2016. He is not on po anticoagulation currently, on LMWH. Will hemoccult stools. Renal status may be contributing to anemia as well.   [name]. Lawrence NP [facility]  11/05/2014, 9:09 AM   The patient was seen and examined, and I agree with the assessment and plan as documented above, with modifications as noted below. Pt says he feels well today. I reviewed all relevant documentation and agree with Dr. Nelly Laurence assessment on 3/17 that pt is not in decompensated heart failure and remains hypovolemic. Lungs remain clear and he has no pedal edema. BNP only 160 as well, supporting this assessment. He does not need inotropic support at the present time. Hgb has dropped and only on prophylactic doses of enoxaparin. Would consider GI losses given his history, but diminished erythropoietin production also a factor. Would not institute warfarin at this time. Continue amiodarone for atrial fibrillation.

## 2014-11-06 LAB — BASIC METABOLIC PANEL
Anion gap: 8 (ref 5–15)
BUN: 70 mg/dL — AB (ref 6–23)
CO2: 19 mmol/L (ref 19–32)
Calcium: 8.4 mg/dL (ref 8.4–10.5)
Chloride: 109 mmol/L (ref 96–112)
Creatinine, Ser: 4.66 mg/dL — ABNORMAL HIGH (ref 0.50–1.35)
GFR calc Af Amer: 12 mL/min — ABNORMAL LOW (ref >90)
GFR calc non Af Amer: 11 mL/min — ABNORMAL LOW (ref >90)
GLUCOSE: 96 mg/dL (ref 70–99)
Potassium: 4.4 mmol/L (ref 3.5–5.1)
Sodium: 136 mmol/L (ref 135–145)

## 2014-11-06 LAB — GLUCOSE, CAPILLARY
GLUCOSE-CAPILLARY: 130 mg/dL — AB (ref 70–99)
GLUCOSE-CAPILLARY: 123 mg/dL — AB (ref 70–99)
Glucose-Capillary: 91 mg/dL (ref 70–99)
Glucose-Capillary: 194 mg/dL — ABNORMAL HIGH (ref 70–99)

## 2014-11-06 NOTE — Progress Notes (Signed)
Subjective: Interval History: patient appetite is good. He denies any nausea no vomiting. Overall patient seems to be feeling much better. Objective: Vital signs in last 24 hours: Temp:  [97.6 F (36.4 C)-98.5 F (36.9 C)] 98.5 F (36.9 C) (03/19 0556) Pulse Rate:  [50-59] 57 (03/19 0556) Resp:  [17-19] 18 (03/19 0556) BP: (92-108)/(41-45) 108/42 mmHg (03/19 0556) SpO2:  [96 %-100 %] 96 % (03/19 0556) Weight change:   Intake/Output from previous day: 03/18 0701 - 03/19 0700 In: 1928.8 [P.O.:240; I.V.:1688.8] Out: 1725 [Urine:500; Stool:1225] Intake/Output this shift:    General appearance: alert, cooperative and no distress Resp: clear to auscultation bilaterally Cardio: regular rate and rhythm, S1, S2 normal, no murmur, click, rub or gallop GI: soft, non-tender; bowel sounds normal; no masses,  no organomegaly Extremities: extremities normal, atraumatic, no cyanosis or edema  Lab Results:  Recent Labs  11/05/14 0441  WBC 6.3  HGB 8.2*  HCT 25.8*  PLT 187   BMET:   Recent Labs  11/05/14 0441 11/06/14 0646  NA 134* 136  K 4.2 4.4  CL 105 109  CO2 21 19  GLUCOSE 107* 96  BUN 68* 70*  CREATININE 4.76* 4.66*  CALCIUM 8.2* 8.4   No results for input(s): PTH in the last 72 hours. Iron Studies: No results for input(s): IRON, TIBC, TRANSFERRIN, FERRITIN in the last 72 hours.  Studies/Results: No results found.  I have reviewed the patient's current medications.  Assessment/Plan: Problem #1 hyperkalemia: His potassium has corrected. problem #2 chronic renal failure :. His BUN and creatinine remains high. his renal function remains stable.. Patient does not have any uremic signs and symptoms. Problem #3 history of his CHF: Presently on diuretics. Patient is nonoliguric and is asymptomatic. patient had about 1200 mL of urine output. Presently patient denies any difficulty breathing. Problem #4 history of colon cancer Problem #5 anemia: His hemoglobin is below our  target goal. His does not have any dizziness or lightheadedness. Problem #6 metabolic bone disease: Calcium and his phosphorus is range. Patient presently is not on a binder. Plan: We'll continue with present management.  We'll check basic metabolic panel, in the morning.      LOS: 6 days   Asya Derryberry S 11/06/2014,8:51 AM

## 2014-11-06 NOTE — Progress Notes (Signed)
Patient alert and oriented creatinine 4.6 potassium normalized CRISTINO DEGROFF OMV:[id number] DOB: October 03, 1931 DOA: 10/31/2014 PCP: [name], MD             Physical Exam: Blood pressure 108/42, pulse 57, temperature 98.5 F (36.9 C), temperature source Oral, resp. rate 18, height 5\' 11"  (1.803 m), weight 184 lb 8.4 oz (83.7 kg), SpO2 96 %. no JVD no carotid bruits no thyromegaly lungs diminished breath sounds in the bases prolonged expiratory phase scattered rhonchi no rales appreciable heart regular rhythm no S3-S4 auscultated no heaves thrills or rubs   Investigations:  Recent Results (from the past 240 hour(s))  Urine culture     Status: None   Collection Time: 10/31/14  1:20 PM  Result Value Ref Range Status   Specimen Description URINE, CATHETERIZED  Final   Special Requests IMMUNE:COMPROMISED  Final   Colony Count   Final    >=100,000 COLONIES/ML Performed at [facility]    Culture   Final    ENTEROCOCCUS SPECIES Performed at [facility]    Report Status 11/03/2014 FINAL  Final   Organism ID, Bacteria ENTEROCOCCUS SPECIES  Final      Susceptibility   Enterococcus species - MIC*    AMPICILLIN <=2 SENSITIVE Sensitive     LEVOFLOXACIN 1 SENSITIVE Sensitive     NITROFURANTOIN <=16 SENSITIVE Sensitive     VANCOMYCIN 1 SENSITIVE Sensitive     TETRACYCLINE >=16 RESISTANT Resistant     * ENTEROCOCCUS SPECIES     Basic Metabolic Panel:  Recent Labs  11/05/14 0441 11/06/14 0646  NA 134* 136  K 4.2 4.4  CL 105 109  CO2 21 19  GLUCOSE 107* 96  BUN 68* 70*  CREATININE 4.76* 4.66*  CALCIUM 8.2* 8.4  PHOS 5.0*  --    Liver Function Tests: No results for input(s): AST, ALT, ALKPHOS, BILITOT, PROT, ALBUMIN in the last 72 hours.   CBC:  Recent Labs  11/05/14 0441  WBC 6.3  HGB 8.2*  HCT 25.8*  MCV 92.8  PLT 187    No results found.    Medications:   Impression:  Principal Problem:   Hyperkalemia Active Problems:  ARF (acute renal failure)   CKD (chronic kidney disease) stage 4, GFR 15-29 ml/min   Chronic combined systolic and diastolic CHF (congestive heart failure)   Colon cancer     Plan: Monitor electrolytes and renal function and hemodynamics  Consultants: Nephrology   Procedures   Antibiotics:                   Code Status:   Family Communication:    Disposition Plan monitor hemodynamics renal function and potassium  Time spent: 30 minutes   LOS: 6 days   Elmer Merwin M   11/06/2014, 10:37 AM

## 2014-11-07 LAB — BASIC METABOLIC PANEL
ANION GAP: 8 (ref 5–15)
BUN: 70 mg/dL — AB (ref 6–23)
CO2: 16 mmol/L — ABNORMAL LOW (ref 19–32)
Calcium: 8.3 mg/dL — ABNORMAL LOW (ref 8.4–10.5)
Chloride: 115 mmol/L — ABNORMAL HIGH (ref 96–112)
Creatinine, Ser: 4.62 mg/dL — ABNORMAL HIGH (ref 0.50–1.35)
GFR calc Af Amer: 13 mL/min — ABNORMAL LOW (ref >90)
GFR, EST NON AFRICAN AMERICAN: 11 mL/min — AB (ref >90)
GLUCOSE: 97 mg/dL (ref 70–99)
Potassium: 4.9 mmol/L (ref 3.5–5.1)
Sodium: 139 mmol/L (ref 135–145)

## 2014-11-07 LAB — GLUCOSE, CAPILLARY
GLUCOSE-CAPILLARY: 93 mg/dL (ref 70–99)
GLUCOSE-CAPILLARY: 106 mg/dL — AB (ref 70–99)
Glucose-Capillary: 190 mg/dL — ABNORMAL HIGH (ref 70–99)
Glucose-Capillary: 122 mg/dL — ABNORMAL HIGH (ref 70–99)

## 2014-11-07 MED ORDER — SODIUM BICARBONATE 650 MG PO TABS
650.0000 mg | ORAL_TABLET | Freq: Two times a day (BID) | ORAL | Status: DC
  Administered 2014-11-07 (×2): 650 mg via ORAL
  Filled 2014-11-07 (×3): qty 1

## 2014-11-07 NOTE — Progress Notes (Signed)
Subjective: Interval History: patient progressively improving. Presently he denies any nausea vomiting. His appetite is good and he offers no complaints. Objective: Vital signs in last 24 hours: Temp:  [98.3 F (36.8 C)-98.4 F (36.9 C)] 98.3 F (36.8 C) (03/20 0645) Pulse Rate:  [36-52] 36 (03/20 0645) Resp:  [18] 18 (03/20 0645) BP: (111-115)/(32-43) 111/32 mmHg (03/20 0645) SpO2:  [97 %-98 %] 97 % (03/20 0645) Weight change:   Intake/Output from previous day: 03/19 0701 - 03/20 0700 In: 3076.3 [P.O.:340; I.V.:2736.3] Out: 1225 [Urine:1225] Intake/Output this shift:    General appearance: alert, cooperative and no distress Resp: clear to auscultation bilaterally Cardio: regular rate and rhythm, S1, S2 normal, no murmur, click, rub or gallop GI: soft, non-tender; bowel sounds normal; no masses,  no organomegaly Extremities: extremities normal, atraumatic, no cyanosis or edema  Lab Results:  Recent Labs  11/05/14 0441  WBC 6.3  HGB 8.2*  HCT 25.8*  PLT 187   BMET:   Recent Labs  11/06/14 0646 11/07/14 0643  NA 136 139  K 4.4 4.9  CL 109 115*  CO2 19 16*  GLUCOSE 96 97  BUN 70* 70*  CREATININE 4.66* 4.62*  CALCIUM 8.4 8.3*   No results for input(s): PTH in the last 72 hours. Iron Studies: No results for input(s): IRON, TIBC, TRANSFERRIN, FERRITIN in the last 72 hours.  Studies/Results: No results found.  I have reviewed the patient's current medications.  Assessment/Plan: Problem #1 hyperkalemia: His potassium has corrected. problem #2 chronic renal failure :. His BUN and creatinine remains is progressively improving. Still his BUN and creatinine is above his baseline. Presently he doesn't have any uremic signs and symptoms. Problem #3 history of his CHF: Presently patient is non-oliguric and asymptomatic Problem #4 history of colon cancer Problem #5 anemia: His hemoglobin is below our target goal. His does not have any dizziness or  lightheadedness. Problem #6 metabolic bone disease: Calcium and his phosphorus is range. Patient presently is not on a binder. Problem #7 metabolic acidosis CRFV:4] We'll decrease IV fluid to 55 mL per hour. 2] will start patient on sodium bicarbonate 650 mg by mouth twice a day 3]We'll check basic metabolic panel, in the morning.      LOS: 7 days   Ashanty Coltrane S 11/07/2014,9:50 AM

## 2014-11-07 NOTE — Progress Notes (Signed)
Notified MD that patient had only voided 225cc for 12 hour shift. Patients wife stated that it was normal for her husband not to void that much if he hasn't had much water all day. Bladder scan revealed 178. Patient had no complaints and said he did not need to void at this time. MD ordered to increase IV NS to 75cc/hr and encourage patient to drink more water. Will continue to monitor patient and encourage him to drink more water.

## 2014-11-07 NOTE — Progress Notes (Signed)
Greg [name] UYQ:[id number] DOB: 09/03/1931 DOA: 10/31/2014 PCP: [name], MD             Physical Exam: Blood pressure 108/47, pulse 55, temperature 98.9 F (37.2 C), temperature source Oral, resp. rate 18, height 5\' 11"  (1.803 m), weight 184 lb 8.4 oz (83.7 kg), SpO2 97 %. lungs show diminished breath sounds in the bases prolonged x-ray phase no rales appreciable heart regular rhythm no S3 or S4 abdomen colostomy being changed at present   Investigations:  Recent Results (from the past 240 hour(s))  Urine culture     Status: None   Collection Time: 10/31/14  1:20 PM  Result Value Ref Range Status   Specimen Description URINE, CATHETERIZED  Final   Special Requests IMMUNE:COMPROMISED  Final   Colony Count   Final    >=100,000 COLONIES/ML Performed at [facility]    Culture   Final    ENTEROCOCCUS SPECIES Performed at [facility]    Report Status 11/03/2014 FINAL  Final   Organism ID, Bacteria ENTEROCOCCUS SPECIES  Final      Susceptibility   Enterococcus species - MIC*    AMPICILLIN <=2 SENSITIVE Sensitive     LEVOFLOXACIN 1 SENSITIVE Sensitive     NITROFURANTOIN <=16 SENSITIVE Sensitive     VANCOMYCIN 1 SENSITIVE Sensitive     TETRACYCLINE >=16 RESISTANT Resistant     * ENTEROCOCCUS SPECIES     Basic Metabolic Panel:  Recent Labs  11/05/14 0441 11/06/14 0646 11/07/14 0643  NA 134* 136 139  K 4.2 4.4 4.9  CL 105 109 115*  CO2 21 19 16*  GLUCOSE 107* 96 97  BUN 68* 70* 70*  CREATININE 4.76* 4.66* 4.62*  CALCIUM 8.2* 8.4 8.3*  PHOS 5.0*  --   --    Liver Function Tests: No results for input(s): AST, ALT, ALKPHOS, BILITOT, PROT, ALBUMIN in the last 72 hours.   CBC:  Recent Labs  11/05/14 0441  WBC 6.3  HGB 8.2*  HCT 25.8*  MCV 92.8  PLT 187    No results found.    Medications:  Impression: Anemia of chronic disease  Principal Problem:   Hyperkalemia Active Problems:   ARF (acute renal failure)   CKD  (chronic kidney disease) stage 4, GFR 15-29 ml/min   Chronic combined systolic and diastolic CHF (congestive heart failure)   Colon cancer     Plan: Patient started on sodium bicarbonate 650 mg by mouth twice a day Will monitor electrolytes in a.m. chronic therapy for anemia during dialysis.  Consultants: Nephrology   Procedures   Antibiotics:                   Code Status:   Family Communication:    Disposition Plan continue erythropoietin injection during dialysis for anemia monitor electrolytes and signs of intravascular volume overload  Time spent: 30 minutes   LOS: 7 days   [name]   11/07/2014, 12:30 PM

## 2014-11-08 ENCOUNTER — Inpatient Hospital Stay
Admission: RE | Admit: 2014-11-08 | Discharge: 2014-12-19 | Disposition: E | Payer: BLUE CROSS/BLUE SHIELD | Source: Ambulatory Visit | Attending: Pulmonary Disease | Admitting: Pulmonary Disease

## 2014-11-08 ENCOUNTER — Encounter: Payer: Self-pay | Admitting: *Deleted

## 2014-11-08 ENCOUNTER — Encounter: Payer: Medicare Other | Admitting: Adult Health

## 2014-11-08 LAB — BASIC METABOLIC PANEL
Anion gap: 7 (ref 5–15)
BUN: 69 mg/dL — ABNORMAL HIGH (ref 6–23)
CO2: 15 mmol/L — ABNORMAL LOW (ref 19–32)
Calcium: 8.3 mg/dL — ABNORMAL LOW (ref 8.4–10.5)
Chloride: 115 mmol/L — ABNORMAL HIGH (ref 96–112)
Creatinine, Ser: 4.61 mg/dL — ABNORMAL HIGH (ref 0.50–1.35)
GFR calc non Af Amer: 11 mL/min — ABNORMAL LOW (ref >90)
GFR, EST AFRICAN AMERICAN: 13 mL/min — AB (ref >90)
GLUCOSE: 91 mg/dL (ref 70–99)
POTASSIUM: 5.2 mmol/L — AB (ref 3.5–5.1)
SODIUM: 137 mmol/L (ref 135–145)

## 2014-11-08 LAB — GLUCOSE, CAPILLARY
Glucose-Capillary: 94 mg/dL (ref 70–99)
Glucose-Capillary: 152 mg/dL — ABNORMAL HIGH (ref 70–99)

## 2014-11-08 LAB — HEMOGLOBIN A1C
Hgb A1c MFr Bld: 6.3 % — ABNORMAL HIGH (ref 4.8–5.6)
MEAN PLASMA GLUCOSE: 134 mg/dL

## 2014-11-08 MED ORDER — TORSEMIDE 20 MG PO TABS
20.0000 mg | ORAL_TABLET | Freq: Every day | ORAL | Status: DC
  Administered 2014-11-08: 20 mg via ORAL
  Filled 2014-11-08: qty 1

## 2014-11-08 MED ORDER — SODIUM BICARBONATE 650 MG PO TABS: 650.0000 mg | ORAL_TABLET | Freq: Three times a day (TID) | ORAL | Status: AC

## 2014-11-08 MED ORDER — TORSEMIDE 20 MG PO TABS: 20.0000 mg | ORAL_TABLET | Freq: Every day | ORAL | Status: AC

## 2014-11-08 MED ORDER — SODIUM BICARBONATE 650 MG PO TABS
650.0000 mg | ORAL_TABLET | Freq: Three times a day (TID) | ORAL | Status: DC
  Administered 2014-11-08: 650 mg via ORAL

## 2014-11-08 MED ORDER — SODIUM POLYSTYRENE SULFONATE PO POWD: Freq: Once | ORAL | Status: AC

## 2014-11-08 NOTE — Progress Notes (Signed)
Subjective: Interval History: Patient does not have any new complaints. His appetite is good and no difficulty breathing. Objective: Vital signs in last 24 hours: Temp:  [97.9 F (36.6 C)-98.9 F (37.2 C)] 97.9 F (36.6 C) (03/21 0507) Pulse Rate:  [52-87] 52 (03/21 0507) Resp:  [16-18] 18 (03/21 0507) BP: (108-117)/(47-50) 113/47 mmHg (03/21 0507) SpO2:  [97 %-98 %] 98 % (03/21 0507) Weight change:   Intake/Output from previous day: 03/20 0701 - 03/21 0700 In: 2441.3 [P.O.:840; I.V.:1601.3] Out: 4158 [Urine:1025; Stool:800] Intake/Output this shift:    General appearance: alert, cooperative and no distress Resp: clear to auscultation bilaterally Cardio: regular rate and rhythm, S1, S2 normal, no murmur, click, rub or gallop GI: soft, non-tender; bowel sounds normal; no masses,  no organomegaly Extremities: extremities normal, atraumatic, no cyanosis or edema  Lab Results: No results for input(s): WBC, HGB, HCT, PLT in the last 72 hours. BMET:   Recent Labs  11/07/14 0643 10/22/2014 0536  NA 139 137  K 4.9 5.2*  CL 115* 115*  CO2 16* 15*  GLUCOSE 97 91  BUN 70* 69*  CREATININE 4.62* 4.61*  CALCIUM 8.3* 8.3*   No results for input(s): PTH in the last 72 hours. Iron Studies: No results for input(s): IRON, TIBC, TRANSFERRIN, FERRITIN in the last 72 hours.  Studies/Results: No results found.  I have reviewed the patient's current medications.  Assessment/Plan: Problem #1 hyperkalemia: His potassium is high normal. Patient is not on any potassium supplement. problem #2 chronic renal failure :. His BUN and creatinine remains is stable. Still his BUN and creatinine is above his baseline. Presently he doesn't have any uremic signs and symptoms. Problem #3 history of his CHF: Presently patient is non-oliguric and asymptomatic Problem #4 history of colon cancer Problem #5 anemia: His hemoglobin is below our target goal. His does not have any dizziness or  lightheadedness. Problem #6 metabolic bone disease: Calcium and his phosphorus is range. Patient presently is not on a binder. Problem #7 metabolic acidosis: Presently started on sodium bicarbonate but his CO2 is declining. Plan:1] We'll decrease IV fluid to 55 mL per hour. 2] will increase the sodium bicarbonate to 650 mg by mouth 3 times a day 3] we'll put him on low potassium diet 4] we will add Demadex 20 mg once a day 5]We'll check basic metabolic panel, in the morning.      LOS: 8 days   Mayre Bury S 11/16/2014,8:37 AM

## 2014-11-08 NOTE — Progress Notes (Signed)
Report called to the [facility]. 

## 2014-11-08 NOTE — Progress Notes (Signed)
I discussed his situation with his wife. He is I think setting a new baseline for his renal function. I think he is okay to go back to the skilled care facility at this point. Discussed with Dr. Lowanda Foster who agrees

## 2014-11-08 NOTE — Progress Notes (Signed)
Cardiology Office Note   ERROR, NO SHOW  Signed, Jory Sims, NP  10/25/2014 7:06 AM    [facility]. [address] [place], [place] [address] Phone: [phone]; Fax: [phone]

## 2014-11-08 NOTE — Clinical Social Work Note (Signed)
CSW facilitated discharge.  CSW notified [facility] of patient's discharge.  CSW notified patient and Mrs. Badal of discharge.  CSW signing off.    Ambrose Pancoast, [place]

## 2014-11-08 NOTE — Discharge Summary (Addendum)
Physician Discharge Summary  Patient ID: Greg [name] MRN: [id number] DOB/AGE: 79-04-1932 79 y.o. Primary Care Physician:Lajuanna Pompa L, MD Admit date: 10/31/2014 Discharge date: 11/02/2014    Discharge Diagnoses:   Principal Problem:   Hyperkalemia Active Problems:   ARF (acute renal failure)   CKD (chronic kidney disease) stage 4, GFR 15-29 ml/min   Chronic combined systolic and diastolic CHF (congestive heart failure)   Colon cancer  Enterococcus urinary tract infection   Medication List    TAKE these medications        acetaminophen 325 MG tablet  Commonly known as:  TYLENOL  Take 1-2 tablets (325-650 mg total) by mouth every 6 (six) hours as needed for fever, headache, mild pain or moderate pain.     amiodarone 200 MG tablet  Commonly known as:  PACERONE  Take 1 tablet (200 mg total) by mouth 2 (two) times daily.     atorvastatin 20 MG tablet  Commonly known as:  LIPITOR  Take 20 mg by mouth daily.     CENTRUM SILVER PO  Take 1 tablet by mouth daily.     enoxaparin 100 MG/ML injection  Commonly known as:  LOVENOX  Inject 0.95 mLs (95 mg total) into the skin daily.     levothyroxine 125 MCG tablet  Commonly known as:  SYNTHROID, LEVOTHROID  Take 125 mcg by mouth daily before breakfast.     loperamide 2 MG capsule  Commonly known as:  IMODIUM  Take 1 capsule (2 mg total) by mouth as needed for diarrhea or loose stools.     mupirocin ointment 2 %  Commonly known as:  BACTROBAN  Place 1 application into the nose 2 (two) times daily.     omeprazole 20 MG capsule  Commonly known as:  PRILOSEC  Take 20 mg by mouth daily.     sertraline 50 MG tablet  Commonly known as:  ZOLOFT  Take 50 mg by mouth daily.     sodium bicarbonate 650 MG tablet  Take 1 tablet (650 mg total) by mouth 3 (three) times daily.     torsemide 20 MG tablet  Commonly known as:  DEMADEX  Take 1 tablet (20 mg total) by mouth daily.     VENELEX Oint  Apply 1 application  topically 3 (three) times daily.        Discharged Condition: Improved    Consults: Nephrology/cardiology  Significant Diagnostic Studies: US Renal  11/01/2014   CLINICAL DATA:  Acute renal failure  EXAM: RENAL/URINARY TRACT ULTRASOUND COMPLETE  COMPARISON:  10/05/2014  FINDINGS: Right Kidney:  Length: 10.9 cm. Slight cortical thinning and slightly increased echotexture throughout the right kidney. No focal mass or hydronephrosis.  Left Kidney:  Length: 10.5 cm. Mild cortical thinning with increased echotexture. No mass or hydronephrosis.  Bladder:  Decompressed, Foley catheter in place.  IMPRESSION: Cortical thinning and increased echotexture in the kidneys, left greater than right. Findings suggestive of chronic medical renal disease. No hydronephrosis.   Electronically Signed   By: Rolm Baptise M.D.   On: 11/01/2014 11:46   Dg Chest Port 1 View  10/31/2014   CLINICAL DATA:  81 year old male presenting with hyperkalemia. Recent history of colonic surgery.  EXAM: PORTABLE CHEST - 1 VIEW  COMPARISON:  Chest x-ray 10/10/2014.  FINDINGS: Lung volumes are slightly low. Film is slightly underpenetrated, limiting the diagnostic sensitivity and specificity of this examination. With these limitations in mind, there is no definite acute consolidative airspace disease, and no pleural effusions.  No evidence of pulmonary edema. Heart size is mildly enlarged. The patient is rotated to the left on today's exam, resulting in distortion of the mediastinal contours and reduced diagnostic sensitivity and specificity for mediastinal pathology.  IMPRESSION: 1. Low lung volumes without radiographic evidence of acute cardiopulmonary disease.   Electronically Signed   By: Vinnie Langton M.D.   On: 10/31/2014 13:28   Dg Chest Port 1 View  10/10/2014   CLINICAL DATA:  ET tube placement  EXAM: PORTABLE CHEST - 1 VIEW  COMPARISON:  10/09/2014  FINDINGS: The endotracheal tube is just below the level of the clavicular  heads. The nasogastric tube extends into the stomach. There is a left jugular central line with tip in the upper SVC. No pneumothorax. New there is a small right pleural effusion. There are mild basilar opacities bilaterally without significant interval change.  IMPRESSION: Persistent right effusion and mild bibasilar airspace opacities without significant interval change.   Electronically Signed   By: Andreas Newport M.D.   On: 10/10/2014 06:36   Dg Chest Port 1 View  10/09/2014   CLINICAL DATA:  Shock, central line placement, diabetes, hypertension, cardiomyopathy  EXAM: PORTABLE CHEST - 1 VIEW  COMPARISON:  Portable exam 1513 hours compared to 0555 hours  FINDINGS: Tip of endotracheal tube projects 6.7 cm above carina.  Nasogastric tube extends into stomach.  BILATERAL internal jugular catheters with tips projecting over proximal SVC.  Minimal enlargement of cardiac silhouette.  Mediastinal contours and pulmonary vascularity normal.  Bibasilar effusions and minimal atelectasis.  Upper lungs clear.  No pneumothorax.  Bones demineralized.  IMPRESSION: Line and tube positions as above.  Bibasilar effusions and atelectasis little changed.   Electronically Signed   By: Lavonia Dana M.D.   On: 10/09/2014 15:54    Lab Results: Basic Metabolic Panel:  Recent Labs  11/07/14 0643 10/27/2014 0536  NA 139 137  K 4.9 5.2*  CL 115* 115*  CO2 16* 15*  GLUCOSE 97 91  BUN 70* 69*  CREATININE 4.62* 4.61*  CALCIUM 8.3* 8.3*   Liver Function Tests: No results for input(s): AST, ALT, ALKPHOS, BILITOT, PROT, ALBUMIN in the last 72 hours.   CBC: No results for input(s): WBC, NEUTROABS, HGB, HCT, MCV, PLT in the last 72 hours.  Recent Results (from the past 240 hour(s))  Urine culture     Status: None   Collection Time: 10/31/14  1:20 PM  Result Value Ref Range Status   Specimen Description URINE, CATHETERIZED  Final   Special Requests IMMUNE:COMPROMISED  Final   Colony Count   Final    >=100,000  COLONIES/ML Performed at [facility]    Culture   Final    ENTEROCOCCUS SPECIES Performed at [facility]    Report Status 11/03/2014 FINAL  Final   Organism ID, Bacteria ENTEROCOCCUS SPECIES  Final      Susceptibility   Enterococcus species - MIC*    AMPICILLIN <=2 SENSITIVE Sensitive     LEVOFLOXACIN 1 SENSITIVE Sensitive     NITROFURANTOIN <=16 SENSITIVE Sensitive     VANCOMYCIN 1 SENSITIVE Sensitive     TETRACYCLINE >=16 RESISTANT Resistant     * ENTEROCOCCUS SPECIES     Hospital Course: This is an 81 year old who had been in his usual state of poor health in a nursing home. He has chronic renal failure a recent diagnosis of metastatic colon cancer and congestive heart failure with an ejection fraction of about 25%. He developed increasing renal failure.  His potassium was elevated. He was brought to the hospital and started on IV fluids. Renal consult was obtained. Cardiology consult was obtained. He was treated with ampicillin for enterococcus in his urine. His renal function did not improve much. He was better as far as his potassium was concerned. I discussed his situation with his wife and the patient and told him that I think this is his new baseline for his renal function. He will be transferred back to the skilled care facility  Discharge Exam: Blood pressure 113/47, pulse 52, temperature 97.9 F (36.6 C), temperature source Oral, resp. rate 18, height 5\' 11"  (1.803 m), weight 83.7 kg (184 lb 8.4 oz), SpO2 98 %. He is awake and alert. He looks comfortable.  Disposition: Back to the skilled care facility to finish his rehabilitation. He will take Kayexalate 60 g twice a week. He needs basic metabolic profile every Tuesday and Friday. He will have PT OT and speech as needed      Discharge Instructions    Discharge to SNF when bed available    Complete by:  As directed              Signed: Skyylar Kopf L   10/25/2014, 9:20 AM

## 2014-11-09 ENCOUNTER — Other Ambulatory Visit (HOSPITAL_COMMUNITY)
Admission: AD | Admit: 2014-11-09 | Discharge: 2014-11-09 | Disposition: A | Discharge status: Home / Self Care | Payer: Medicare Other | Source: Skilled Nursing Facility | Attending: Pulmonary Disease | Admitting: Pulmonary Disease

## 2014-11-09 LAB — BASIC METABOLIC PANEL
Anion gap: 7 (ref 5–15)
BUN: 70 mg/dL — AB (ref 6–23)
CO2: 15 mmol/L — ABNORMAL LOW (ref 19–32)
Calcium: 8.7 mg/dL (ref 8.4–10.5)
Chloride: 114 mmol/L — ABNORMAL HIGH (ref 96–112)
Creatinine, Ser: 4.88 mg/dL — ABNORMAL HIGH (ref 0.50–1.35)
GFR, EST AFRICAN AMERICAN: 12 mL/min — AB (ref >90)
GFR, EST NON AFRICAN AMERICAN: 10 mL/min — AB (ref >90)
Glucose, Bld: 109 mg/dL — ABNORMAL HIGH (ref 70–99)
POTASSIUM: 5.2 mmol/L — AB (ref 3.5–5.1)
SODIUM: 136 mmol/L (ref 135–145)

## 2014-11-09 NOTE — Care Management Utilization Note (Signed)
UR completed 

## 2014-11-11 ENCOUNTER — Encounter: Payer: Self-pay | Admitting: Adult Health

## 2014-11-11 ENCOUNTER — Ambulatory Visit (INDEPENDENT_AMBULATORY_CARE_PROVIDER_SITE_OTHER): Payer: Medicare Other | Admitting: Adult Health

## 2014-11-11 VITALS — BP 98/48 | HR 73 | Ht 71.0 in | Wt 163.0 lb

## 2014-11-11 DIAGNOSIS — I4891 Unspecified atrial fibrillation: Secondary | ICD-10-CM | POA: Diagnosis not present

## 2014-11-11 DIAGNOSIS — Z79899 Other long term (current) drug therapy: Secondary | ICD-10-CM

## 2014-11-11 DIAGNOSIS — I5022 Chronic systolic (congestive) heart failure: Secondary | ICD-10-CM | POA: Diagnosis not present

## 2014-11-11 MED ORDER — AMIODARONE HCL 200 MG PO TABS: 100.0000 mg | ORAL_TABLET | Freq: Every day | ORAL | Status: DC

## 2014-11-11 MED ORDER — AMIODARONE HCL 200 MG PO TABS: 200.0000 mg | ORAL_TABLET | Freq: Every day | ORAL | Status: AC

## 2014-11-11 MED ORDER — ENOXAPARIN SODIUM 60 MG/0.6ML ~~LOC~~ SOLN: 60.0000 mg | SUBCUTANEOUS | Status: AC

## 2014-11-11 MED ORDER — WARFARIN SODIUM 3 MG PO TABS: 3.0000 mg | ORAL_TABLET | Freq: Once | ORAL | Status: AC

## 2014-11-11 NOTE — Progress Notes (Signed)
Cardiology Office Note   Date:  11/11/2014   ID:  Greg [name], Greg [name] [birth year], MRN [id number]  PCP:  [name], MD  Cardiologist:  Cloria Spring, NP   Chief Complaint  Patient presents with  . Congestive Heart Failure    Combined  . Atrial Fibrillation      History of Present Illness: Greg [name] is a 81 y.o. male who presents for ongoing assessment and management ofchronic systolic heart failure, atrial fibrillation CHADS VASC Score of 5,anemia, and chronic kidney disease, stage 3, with diabetes.  The patient is here post hospitalization after lengthy admission for increasing renal failure, hyperkalemia, and UTI.cardiology was consulted.  2.  Question need to continue Coumadin and possibly start inotropes to assist with chronic kidney disease, and fluid retention.  Was felt that the patient was hypovolemic after assessment by Dr. Harl Bowie.  IV fluids were recommended.  There was no substantial increase in kidney function.  Due to his debilitated state, the patient was not restarted on Coumadin with discussion of need to restart after being seen on followup.  The patient was admitted to a skilled care facility on discharge.  Followup labs are managed by Dr. Luan Pulling.  He is without complaint today with the exception of fatigue.  He has some muscle soreness from physical therapy.  He denies bleeding issues, chest pain, or significant shortness of breath.  Past Medical History  Diagnosis Date  . Type 2 diabetes mellitus   . Anemia of chronic disease   . CKD (chronic kidney disease) stage 3, GFR 30-59 ml/min   . Herpes zoster   . Obesity   . Exposure to hazardous substance   . Intermittent Leukopenia   . Essential hypertension   . Sleep apnea     Sleep Apnea score of 7  . Cardiomyopathy     LVEF 40-45% January 2015  . Left bundle branch block     Past Surgical History  Procedure Laterality Date  . Nasal polyp surgery    . Ventral hernia repair N/A 09/14/2013     Procedure: HERNIA REPAIR VENTRAL ADULT WITH MESH;  Surgeon: Jamesetta So, MD;  Location: [facility];  Service: General;  Laterality: N/A;  . Insertion of mesh N/A 09/14/2013    Procedure: INSERTION OF MESH;  Surgeon: Jamesetta So, MD;  Location: [facility];  Service: General;  Laterality: N/A;  . Colonoscopy N/A 10/04/2014    SLF:  1. Rectal bleeding /anemia due to Mass most likely at the hepatic flexure and hemorroids. 2. Spott Tattoo placed at the distal aspect of mass. 3. Sever diverticulosis throughout the entire examined colon. 4. The left colonis redundant 5. Moderate siized internal hemorroids.   . Esophagogastroduodenoscopy N/A 10/04/2014    SLF: 1. Stricture at ehe gastroesophageal junction 2. Small hiatal hernia 3. Multiple small ulcers in the gastrtic antrum 4. Multiple ulcers in teh bulb and 2nd part of the duodenum 5. Anemia/ Melena due to th PUD/R colon mass  . Laparotomy N/A 10/08/2014    Procedure: Barrie Folk LAPAROTOMY;  Surgeon: Georganna Skeans, MD;  Location: [facility];  Service: General;  Laterality: N/A;  . Partial colectomy N/A 10/08/2014    Procedure: PARTIAL COLECTOMY WITH ILEOSTOMY;  Surgeon: Georganna Skeans, MD;  Location: [facility];  Service: General;  Laterality: N/A;     No current outpatient prescriptions on file.   No current facility-administered medications for this visit.    Allergies:   Adhesive and Celery oil    Social  History:  The patient  reports that he has quit smoking. His smoking use included Cigars. His smokeless tobacco use includes Chew. He reports that he does not drink alcohol or use illicit drugs.   Family History:  The patient's family history includes Diabetes in his brother; Heart Problems in his mother; Heart attack in his brother; Stroke in his father. There is no history of Colon cancer.    ROS: .   All other systems are reviewed and negative.Unless otherwise mentioned in H&P above.   PHYSICAL EXAM: VS:  There were no vitals taken for this  visit. , BMI There is no weight on file to calculate BMI. GEN: Well nourished, well developed, in no acute distress HEENT: normal Neck: no JVD, carotid bruits, or masses Cardiac: IRRR, distant heart sounds; no murmurs, rubs, or gallops,no edema  Respiratory:  clear to auscultation bilaterally, normal work of breathing GI: soft, nontender, nondistended, + BS MS: no deformity or atrophygeneralized deconditioning Skin: warm and dry, no rash Neuro:  Strength and sensation are intact Psych: euthymic mood, full affect   Recent Labs: 10/12/2014: Magnesium 2.0 10/31/2014: ALT 14; TSH 7.046* 11/04/2014: B Natriuretic Peptide 160.0* 11/05/2014: Hemoglobin 8.2*; Platelets 187 11/09/2014: BUN 70*; Creatinine 4.88*; Potassium 5.2*; Sodium 136    Lipid Panel    Component Value Date/Time   TRIG 80 10/08/2014 1337      Wt Readings from Last 3 Encounters:  10/16/14 190 lb 1.6 oz (86.229 kg)  10/02/13 247 lb 6.4 oz (112.22 kg)  09/15/13 273 lb 9.5 oz (124.1 kg)      Other studies Reviewed: Additional studies/ records that were reviewed today include: Hospital discharge summary and medication lis   ASSESSMENT AND PLAN:  1.  Atrial fibrillation:I have spoken with Dr. Harl Bowie by phone, to clarify switching from low molecular weight heparin to Coumadin.  The patient's renal function would warn change to Coumadin.  Dr. Harl Bowie is in agreement to do so.  I have spoken with Edrick Oh, RN, Coumadin clinic nurse.  It is their recommendation that the patient stop Lovenox for one day, began Lovenox 60 mg subcutaneous for 2 days.  He will begin Coumadin 3 mg a day, starting today.  A CBC, BMET, and INR will be drawn on Monday November 15 2014.  [facility] will be [facility] to [facility] for ongoing Coumadin dosing.  We will review other labs as they arrive.  We will also decrease his amiodarone from 200 mg twice a day to 200 mg daily.  His blood pressure is slightly low, this may be an area due to his atrial fibrillation.  He  will see [place] again in 2 weeks for a blood pressure check and overall assessment of how he is doing.  He will remain at the [facility] for physical therapy.  These orders are provided to the [facility], I also spoke with Dr. Luan Pulling, concerning labs.  They will be sent to him for his review results as well.  2. Chronic combined systolic and diastolic CHF: Most recent ejection fraction is 25%during hospitalization.  There is some evidence of cardiorenal syndrome.  He will remain on torsemide 20 mg by mouth daily.  BMET will be drawn in 4 days.  3. Chronic kidney disease:he is followed by nephrology.  Most recent creatinine 4.88.  4. Severe deconditioning:He has undergone physical therapy at the [facility].  He is not yet walking.  He is advised to eat and drink regularly, to keep up his stamina and to  not skip meals.   Current medicines are reviewed at length with the patient today.  I explained to him medication changes to include changing dose, and amiodarone, switching to Coumadin and stopping, Lovenox in a few days.  Labs/ tests ordered today include: CBC, BMET, INR. No orders of the defined types were placed in this encounter.     Disposition:   FU with 2 weeks.   Signed, Jory Sims, NP  11/11/2014 7:32 AM    [facility] [address], [place],  [address] Phone: [phone]; Fax: [phone]

## 2014-11-11 NOTE — Patient Instructions (Signed)
Your physician recommends that you schedule a follow-up appointment in: 2 weeks   DECREASE Amiodarone to 100 mg daily   Continue Lovenox,start Coumadin 3 mg every evening until INR is2, check INR on Monday  Call Edrick Oh with results [phone]  CBC with INR on Monday  BMET on Monday

## 2014-11-11 NOTE — Progress Notes (Deleted)
Name: Greg [name]    DOB: [birth year]  Age: 81 y.o.  MR#: [id number]       PCP:  [name], MD      Insurance: Payor: MEDICARE / Plan: MEDICARE PART A AND B / Product Type: *No Product type* /   CC:    Chief Complaint  Patient presents with  . Congestive Heart Failure    Combined  . Atrial Fibrillation    VS Filed Vitals:   11/11/14 1429  BP: 98/48  Pulse: 73  Height: 5\' 11"  (1.803 m)  Weight: 163 lb (73.936 kg)  SpO2: 95%    Weights Current Weight  11/11/14 163 lb (73.936 kg)  10/16/14 190 lb 1.6 oz (86.229 kg)  10/02/13 247 lb 6.4 oz (112.22 kg)    Blood Pressure  BP Readings from Last 3 Encounters:  11/11/14 98/48  10/25/14 96/38  10/16/14 121/52     Admit date:  (Not on file) Last encounter with RMR:  Visit date not found   Allergy Adhesive and Celery oil  No current outpatient prescriptions on file.   No current facility-administered medications for this visit.    Discontinued Meds:   There are no discontinued medications.  Patient Active Problem List   Diagnosis Date Noted  . ARF (acute renal failure) 10/31/2014  . CKD (chronic kidney disease) stage 4, GFR 15-29 ml/min 10/31/2014  . Chronic combined systolic and diastolic CHF (congestive heart failure) 10/31/2014  . Colon cancer 10/31/2014  . Acute renal failure superimposed on stage 4 chronic kidney disease 10/12/2014  . Shock   . Endotracheal tube present   . Acute renal failure syndrome   . New onset atrial fibrillation   . Colonic mass   . PUD (peptic ulcer disease)   . Protein-calorie malnutrition, severe 09/25/2014  . Anemia due to GI blood loss   . Fall 09/22/2014  . Acute respiratory failure with hypoxia 09/22/2014  . Atrial fibrillation/ atrilal flutter- new 09/22/2014  . Elevated troponin 09/22/2014  . Acute on chronic renal insufficiency 09/22/2014  . Hyperkalemia 09/22/2014  . Elevated LFTs 09/22/2014  . Acute combined systolic and diastolic congestive heart failure  09/22/2014  . Type 2 diabetes mellitus with renal manifestations 09/22/2014  . Cardiomyopathy-etiology undetermined- EF 40-45% by echo Jan 2015 09/22/2014  . Loss of weight 10/02/2013  . Early satiety 10/02/2013  . Ventral hernia with obstruction 09/14/2013  . Abnormal EKG- known IVCD (LBBB type) 09/07/2013  . Dehydration 09/04/2013  . Small bowel obstruction 09/04/2013  . Colitis 09/02/2013  . Chronic renal insufficiency, stage III (moderate) 02/26/2011  . Anemia of chronic disease 02/10/2011  . Intermittent Leukopenia 02/10/2011    LABS    Component Value Date/Time   NA 136 11/09/2014 0710   NA 137 11/07/2014 0536   NA 139 11/07/2014 0643   K 5.2* 11/09/2014 0710   K 5.2* 11/04/2014 0536   K 4.9 11/07/2014 0643   CL 114* 11/09/2014 0710   CL 115* 10/31/2014 0536   CL 115* 11/07/2014 0643   CO2 15* 11/09/2014 0710   CO2 15* 11/11/2014 0536   CO2 16* 11/07/2014 0643   GLUCOSE 109* 11/09/2014 0710   GLUCOSE 91 10/31/2014 0536   GLUCOSE 97 11/07/2014 0643   BUN 70* 11/09/2014 0710   BUN 69* 10/24/2014 0536   BUN 70* 11/07/2014 0643   BUN 49* 10/12/2013   BUN 25* 08/18/2013   CREATININE 4.88* 11/09/2014 0710   CREATININE 4.61* 11/04/2014 0536   CREATININE 4.62* 11/07/2014  1610   CREATININE 2.01 10/12/2013   CREATININE 1.95 08/18/2013   CREATININE 1.60* 12/01/2007 0855   CREATININE RESCHEDULED 10/31/2007 0948   CALCIUM 8.7 11/09/2014 0710   CALCIUM 8.3* 11/05/2014 0536   CALCIUM 8.3* 11/07/2014 0643   CALCIUM 8.0* 09/04/2013 1030   GFRNONAA 10* 11/09/2014 0710   GFRNONAA 11* 10/19/2014 0536   GFRNONAA 11* 11/07/2014 0643   GFRAA 12* 11/09/2014 0710   GFRAA 13* 11/05/2014 0536   GFRAA 13* 11/07/2014 0643   CMP     Component Value Date/Time   NA 136 11/09/2014 0710   K 5.2* 11/09/2014 0710   CL 114* 11/09/2014 0710   CO2 15* 11/09/2014 0710   GLUCOSE 109* 11/09/2014 0710   BUN 70* 11/09/2014 0710   BUN 49* 10/12/2013   CREATININE 4.88* 11/09/2014 0710    CREATININE 2.01 10/12/2013   CREATININE 1.60* 12/01/2007 0855   CALCIUM 8.7 11/09/2014 0710   CALCIUM 8.0* 09/04/2013 1030   PROT 6.4 10/31/2014 1241   ALBUMIN 2.9* 10/31/2014 1241   AST 12 10/31/2014 1241   AST 18 08/18/2013   ALT 14 10/31/2014 1241   ALKPHOS 63 10/31/2014 1241   BILITOT 0.4 10/31/2014 1241   BILITOT 0.6 08/18/2013   GFRNONAA 10* 11/09/2014 0710   GFRAA 12* 11/09/2014 0710       Component Value Date/Time   WBC 6.3 11/05/2014 0441   WBC 6.9 11/01/2014 0732   WBC 8.2 10/31/2014 1241   HGB 8.2* 11/05/2014 0441   HGB 9.2* 11/01/2014 0732   HGB 9.3* 10/31/2014 1241   HGB 10.0 10/12/2013   HGB 10.0 08/18/2013   HCT 25.8* 11/05/2014 0441   HCT 31.2* 11/01/2014 0732   HCT 31.0* 10/31/2014 1241   HCT 32 10/12/2013   HCT 30 08/18/2013   MCV 92.8 11/05/2014 0441   MCV 97.2 11/01/2014 0732   MCV 96.9 10/31/2014 1241    Lipid Panel     Component Value Date/Time   TRIG 80 10/08/2014 1337    ABG    Component Value Date/Time   PHART 7.223* 11/01/2014 1025   PCO2ART 30.4* 11/01/2014 1025   PO2ART 96.3 11/01/2014 1025   HCO3 12.1* 11/01/2014 1025   TCO2 11.1 11/01/2014 1025   ACIDBASEDEF 14.1* 11/01/2014 1025   O2SAT 96.4 11/01/2014 1025     Lab Results  Component Value Date   TSH 7.046* 10/31/2014   BNP (last 3 results)  Recent Labs  09/22/14 0731 10/10/14 0415 11/04/14 1259  BNP 1657.0* 155.8* 160.0*    ProBNP (last 3 results) No results for input(s): PROBNP in the last 8760 hours.  Cardiac Panel (last 3 results) No results for input(s): CKTOTAL, CKMB, TROPONINI, RELINDX in the last 72 hours.  Iron/TIBC/Ferritin/ %Sat    Component Value Date/Time   IRON 64 10/12/2014 0950   TIBC 216 10/12/2014 0950   FERRITIN 1494* 10/12/2014 0950   IRONPCTSAT 30 10/12/2014 0950     EKG Orders placed or performed during the hospital encounter of 10/31/14  . ED EKG  . ED EKG  . EKG     Prior Assessment and Plan Problem List as of 11/11/2014       Cardiovascular and Mediastinum   Cardiomyopathy-etiology undetermined- EF 40-45% by echo Jan 2015   Atrial fibrillation/ atrilal flutter- new   Acute combined systolic and diastolic congestive heart failure   New onset atrial fibrillation   Chronic combined systolic and diastolic CHF (congestive heart failure)     Respiratory   Acute respiratory failure  with hypoxia     Digestive   Colitis   Last Assessment & Plan 10/02/2013 Office Visit Written 10/02/2013 11:31 AM by Mahala Menghini, PA-C    Abnormal CT findings back in December, at that time felt to be infectious etiology rather than related to hernia and ischemia. Followup CT in January showed no more abnormalities in the colon. Clinically patient has returned to baseline with regards to his bowel function.      Small bowel obstruction   Ventral hernia with obstruction   PUD (peptic ulcer disease)   Colon cancer     Endocrine   Type 2 diabetes mellitus with renal manifestations     Genitourinary   Chronic renal insufficiency, stage III (moderate)   Acute on chronic renal insufficiency   Acute renal failure syndrome   Acute renal failure superimposed on stage 4 chronic kidney disease   ARF (acute renal failure)   CKD (chronic kidney disease) stage 4, GFR 15-29 ml/min     Other   Abnormal EKG- known IVCD (LBBB type)   Anemia of chronic disease   Last Assessment & Plan 10/02/2013 Office Visit Edited 10/02/2013 11:30 AM by Mahala Menghini, PA-C    Anemia chronic disease dating back at least 3 years, has been very stable. Recent anemia panel consistent with this. The patient denies prior colonoscopy or upper endoscopy. No family history of colon cancer. No overt GI bleeding. Hemoccult status unknown. Given his age, at this point without any significant changes in his hemoglobin, colonoscopy not clearly indicated. At this point, given recent surgery, would not pursue elective colonoscopy but can readdress at his next office visit.       Intermittent Leukopenia   Dehydration   Loss of weight   Last Assessment & Plan 10/02/2013 Office Visit Written 10/02/2013 11:34 AM by Mahala Menghini, PA-C    Significant unintentional weight loss in the setting of bowel obstruction, acute on chronic renal insufficiency, peripheral edema which is now being managed, surgery. His wife states he continues to lose about 1 pound per day at this point. 33 pound weight loss at this time. Unclear how much of this was peripheral edema however. He describes significant early satiety, prefers to have liquid diet only. Slowly has been able to start back on solid foods but only small portions at a time. Encouraged frequent small meals daily 6 daily. Continue to monitor for further weight loss. If greater than 5 pounds patient will call. We will make sure his thyroid function has been checked recently. We have called Dr. Luan Pulling to verify this. Return to the office to see Dr. Gala Romney in the next few weeks to followup on weight loss, determine if any endoscopic evaluation be necessary.      Early satiety   Fall   Elevated troponin   Hyperkalemia   Elevated LFTs   Anemia due to GI blood loss   Protein-calorie malnutrition, severe   Colonic mass   Endotracheal tube present   Shock       Imaging: US Renal  11/01/2014   CLINICAL DATA:  Acute renal failure  EXAM: RENAL/URINARY TRACT ULTRASOUND COMPLETE  COMPARISON:  10/05/2014  FINDINGS: Right Kidney:  Length: 10.9 cm. Slight cortical thinning and slightly increased echotexture throughout the right kidney. No focal mass or hydronephrosis.  Left Kidney:  Length: 10.5 cm. Mild cortical thinning with increased echotexture. No mass or hydronephrosis.  Bladder:  Decompressed, Foley catheter in place.  IMPRESSION: Cortical thinning and increased echotexture  in the kidneys, left greater than right. Findings suggestive of chronic medical renal disease. No hydronephrosis.   Electronically Signed   By: Rolm Baptise M.D.    On: 11/01/2014 11:46   Dg Chest Port 1 View  10/31/2014   CLINICAL DATA:  81 year old male presenting with hyperkalemia. Recent history of colonic surgery.  EXAM: PORTABLE CHEST - 1 VIEW  COMPARISON:  Chest x-ray 10/10/2014.  FINDINGS: Lung volumes are slightly low. Film is slightly underpenetrated, limiting the diagnostic sensitivity and specificity of this examination. With these limitations in mind, there is no definite acute consolidative airspace disease, and no pleural effusions. No evidence of pulmonary edema. Heart size is mildly enlarged. The patient is rotated to the left on today's exam, resulting in distortion of the mediastinal contours and reduced diagnostic sensitivity and specificity for mediastinal pathology.  IMPRESSION: 1. Low lung volumes without radiographic evidence of acute cardiopulmonary disease.   Electronically Signed   By: Vinnie Langton M.D.   On: 10/31/2014 13:28

## 2014-11-12 ENCOUNTER — Other Ambulatory Visit (HOSPITAL_COMMUNITY)
Admission: AD | Admit: 2014-11-12 | Discharge: 2014-11-12 | Disposition: A | Discharge status: Home / Self Care | Payer: Medicare Other | Source: Skilled Nursing Facility | Attending: Pulmonary Disease | Admitting: Pulmonary Disease

## 2014-11-12 LAB — BASIC METABOLIC PANEL
Anion gap: 8 (ref 5–15)
BUN: 75 mg/dL — AB (ref 6–23)
CALCIUM: 9 mg/dL (ref 8.4–10.5)
CO2: 14 mmol/L — ABNORMAL LOW (ref 19–32)
CREATININE: 5.64 mg/dL — AB (ref 0.50–1.35)
Chloride: 117 mmol/L — ABNORMAL HIGH (ref 96–112)
GFR calc Af Amer: 10 mL/min — ABNORMAL LOW (ref >90)
GFR, EST NON AFRICAN AMERICAN: 9 mL/min — AB (ref >90)
Glucose, Bld: 108 mg/dL — ABNORMAL HIGH (ref 70–99)
Potassium: 5.4 mmol/L — ABNORMAL HIGH (ref 3.5–5.1)
Sodium: 139 mmol/L (ref 135–145)

## 2014-11-15 ENCOUNTER — Ambulatory Visit (INDEPENDENT_AMBULATORY_CARE_PROVIDER_SITE_OTHER): Payer: Medicare Other | Admitting: *Deleted

## 2014-11-15 ENCOUNTER — Encounter (HOSPITAL_COMMUNITY)
Admission: RE | Admit: 2014-11-15 | Discharge: 2014-11-15 | Disposition: A | Discharge status: Home / Self Care | Payer: Medicare Other | Source: Skilled Nursing Facility | Attending: Internal Medicine | Admitting: Internal Medicine

## 2014-11-15 DIAGNOSIS — I82629 Acute embolism and thrombosis of deep veins of unspecified upper extremity: Secondary | ICD-10-CM | POA: Insufficient documentation

## 2014-11-15 DIAGNOSIS — I480 Paroxysmal atrial fibrillation: Secondary | ICD-10-CM

## 2014-11-15 DIAGNOSIS — I82621 Acute embolism and thrombosis of deep veins of right upper extremity: Secondary | ICD-10-CM

## 2014-11-15 LAB — BASIC METABOLIC PANEL
Anion gap: 10 (ref 5–15)
BUN: 92 mg/dL — AB (ref 6–23)
CALCIUM: 8.9 mg/dL (ref 8.4–10.5)
CHLORIDE: 117 mmol/L — AB (ref 96–112)
CO2: 12 mmol/L — ABNORMAL LOW (ref 19–32)
CREATININE: 6.93 mg/dL — AB (ref 0.50–1.35)
GFR calc non Af Amer: 7 mL/min — ABNORMAL LOW (ref >90)
GFR, EST AFRICAN AMERICAN: 8 mL/min — AB (ref >90)
Glucose, Bld: 130 mg/dL — ABNORMAL HIGH (ref 70–99)
Potassium: 6.1 mmol/L (ref 3.5–5.1)
Sodium: 139 mmol/L (ref 135–145)

## 2014-11-15 LAB — CBC
HCT: 28.1 % — ABNORMAL LOW (ref 39.0–52.0)
Hemoglobin: 8.8 g/dL — ABNORMAL LOW (ref 13.0–17.0)
MCH: 29.7 pg (ref 26.0–34.0)
MCHC: 31.3 g/dL (ref 30.0–36.0)
MCV: 94.9 fL (ref 78.0–100.0)
PLATELETS: 262 10*3/uL (ref 150–400)
RBC: 2.96 MIL/uL — AB (ref 4.22–5.81)
RDW: 19.6 % — AB (ref 11.5–15.5)
WBC: 8.2 10*3/uL (ref 4.0–10.5)

## 2014-11-15 LAB — PROTIME-INR
INR: 1.2 — AB (ref 0.9–1.1)
INR: 1.25 (ref 0.00–1.49)
Prothrombin Time: 15.9 seconds — ABNORMAL HIGH (ref 11.6–15.2)

## 2014-11-17 ENCOUNTER — Ambulatory Visit (INDEPENDENT_AMBULATORY_CARE_PROVIDER_SITE_OTHER): Payer: Medicare Other | Admitting: *Deleted

## 2014-11-17 ENCOUNTER — Other Ambulatory Visit (HOSPITAL_COMMUNITY)
Admission: AD | Admit: 2014-11-17 | Discharge: 2014-11-17 | Disposition: A | Discharge status: Home / Self Care | Payer: Medicare Other | Source: Skilled Nursing Facility | Attending: Pulmonary Disease | Admitting: Pulmonary Disease

## 2014-11-17 DIAGNOSIS — I480 Paroxysmal atrial fibrillation: Secondary | ICD-10-CM

## 2014-11-17 DIAGNOSIS — I82629 Acute embolism and thrombosis of deep veins of unspecified upper extremity: Secondary | ICD-10-CM

## 2014-11-17 LAB — POCT INR: INR: 1.31

## 2014-11-17 LAB — BASIC METABOLIC PANEL
ANION GAP: 9 (ref 5–15)
BUN: 96 mg/dL — AB (ref 6–23)
CALCIUM: 8.9 mg/dL (ref 8.4–10.5)
CO2: 12 mmol/L — ABNORMAL LOW (ref 19–32)
Chloride: 120 mmol/L — ABNORMAL HIGH (ref 96–112)
Creatinine, Ser: 7.34 mg/dL — ABNORMAL HIGH (ref 0.50–1.35)
GFR calc Af Amer: 7 mL/min — ABNORMAL LOW (ref >90)
GFR calc non Af Amer: 6 mL/min — ABNORMAL LOW (ref >90)
Glucose, Bld: 127 mg/dL — ABNORMAL HIGH (ref 70–99)
Potassium: 5.9 mmol/L — ABNORMAL HIGH (ref 3.5–5.1)
SODIUM: 141 mmol/L (ref 135–145)

## 2014-11-17 LAB — PROTIME-INR
INR: 1.31 (ref 0.00–1.49)
Prothrombin Time: 16.4 seconds — ABNORMAL HIGH (ref 11.6–15.2)

## 2014-11-19 ENCOUNTER — Other Ambulatory Visit (HOSPITAL_COMMUNITY)
Admission: AD | Admit: 2014-11-19 | Discharge: 2014-11-19 | Disposition: A | Discharge status: Home / Self Care | Payer: Medicare Other | Source: Skilled Nursing Facility | Attending: Pulmonary Disease | Admitting: Pulmonary Disease

## 2014-11-19 LAB — BASIC METABOLIC PANEL
Anion gap: 11 (ref 5–15)
BUN: 100 mg/dL — AB (ref 6–23)
CO2: 9 mmol/L — CL (ref 19–32)
Calcium: 9.1 mg/dL (ref 8.4–10.5)
Chloride: 126 mmol/L — ABNORMAL HIGH (ref 96–112)
Creatinine, Ser: 8.09 mg/dL — ABNORMAL HIGH (ref 0.50–1.35)
GFR calc Af Amer: 6 mL/min — ABNORMAL LOW (ref >90)
GFR calc non Af Amer: 6 mL/min — ABNORMAL LOW (ref >90)
GLUCOSE: 123 mg/dL — AB (ref 70–99)
POTASSIUM: 6.2 mmol/L — AB (ref 3.5–5.1)
Sodium: 146 mmol/L — ABNORMAL HIGH (ref 135–145)

## 2014-11-19 LAB — PROTIME-INR
INR: 1.61 — AB (ref 0.00–1.49)
PROTHROMBIN TIME: 19.3 s — AB (ref 11.6–15.2)

## 2014-11-19 DEATH — deceased

## 2014-11-20 ENCOUNTER — Encounter (HOSPITAL_COMMUNITY)
Admission: RE | Admit: 2014-11-20 | Discharge: 2014-11-20 | Disposition: A | Discharge status: Home / Self Care | Payer: Medicare Other | Source: Skilled Nursing Facility | Attending: Internal Medicine | Admitting: Internal Medicine

## 2014-11-20 DIAGNOSIS — Z7901 Long term (current) use of anticoagulants: Secondary | ICD-10-CM | POA: Diagnosis not present

## 2014-11-20 LAB — PROTIME-INR
INR: 1.81 — AB (ref 0.00–1.49)
PROTHROMBIN TIME: 21.1 s — AB (ref 11.6–15.2)

## 2014-11-22 ENCOUNTER — Ambulatory Visit: Payer: Self-pay | Admitting: *Deleted

## 2014-11-22 DIAGNOSIS — I82629 Acute embolism and thrombosis of deep veins of unspecified upper extremity: Secondary | ICD-10-CM

## 2014-11-22 DIAGNOSIS — I480 Paroxysmal atrial fibrillation: Secondary | ICD-10-CM

## 2014-11-26 ENCOUNTER — Ambulatory Visit: Payer: Medicare Other | Admitting: Adult Health

## 2014-12-09 ENCOUNTER — Ambulatory Visit: Payer: Medicare Other | Admitting: Gastroenterology

## 2014-12-19 DEATH — deceased

## 2015-06-30 IMAGING — CT CT HEAD W/O CM
1 of 2 series · 15 of 30 positions shown, 19 images · non-contrast
Comparison: None.

CLINICAL DATA: 82-year-old male with dizziness and fall this
morning when going to the bathroom. Left side head and year injury.
Labored breathing. Initial encounter.

EXAM:
CT HEAD WITHOUT CONTRAST
TECHNIQUE: Contiguous axial images were obtained from the base of the skull
through the vertex without intravenous contrast.

[Series 2: headtrauma 4.8 h37s · axial · 0.46mm/px · z∈[+69,+224]mm · 15 of 36 slices shown, 19 images]
[im 2/36  brain]
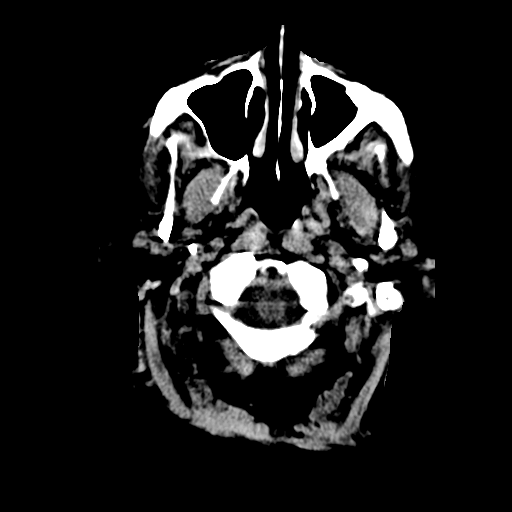
[im 2/36  bone]
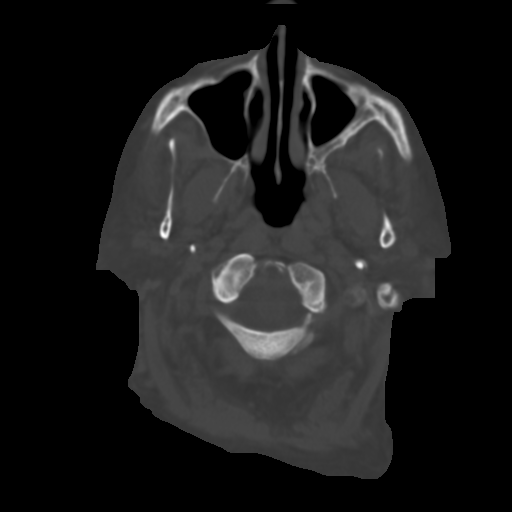
[im 4/36  brain]
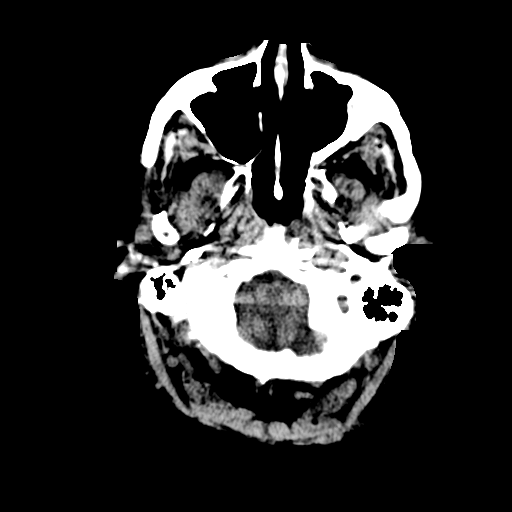
[im 8/36  brain]
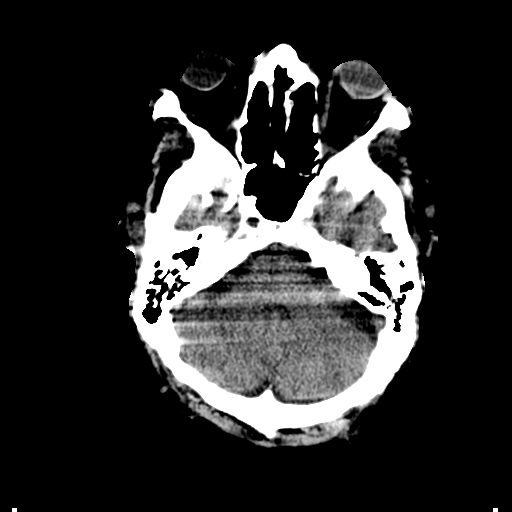
[im 10/36  brain]
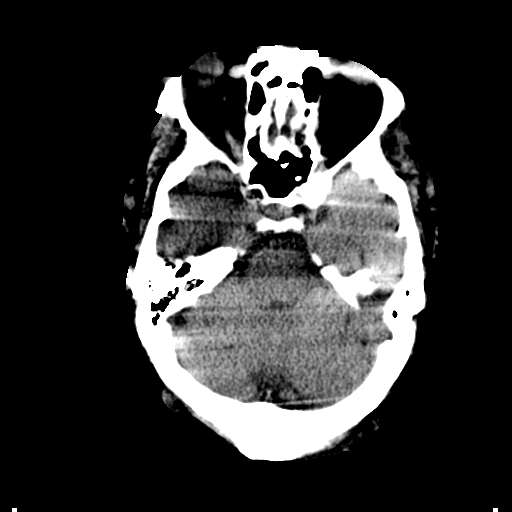
[im 12/36  brain]
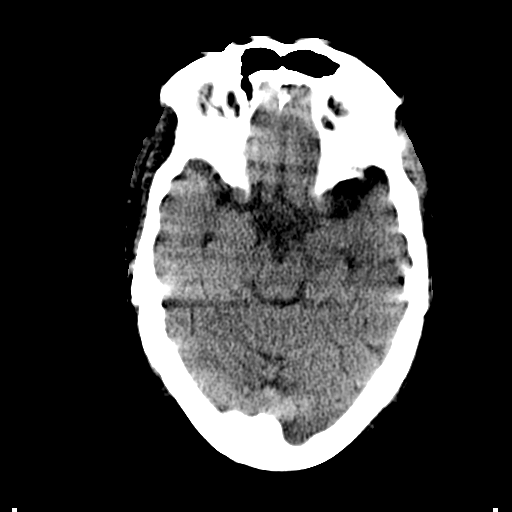
[im 12/36  bone]
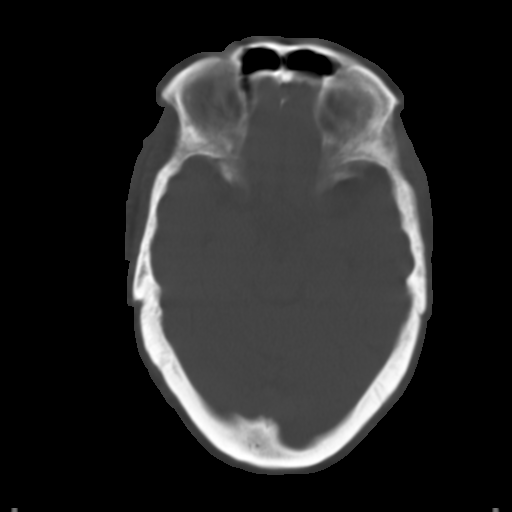
[im 13/36  brain]
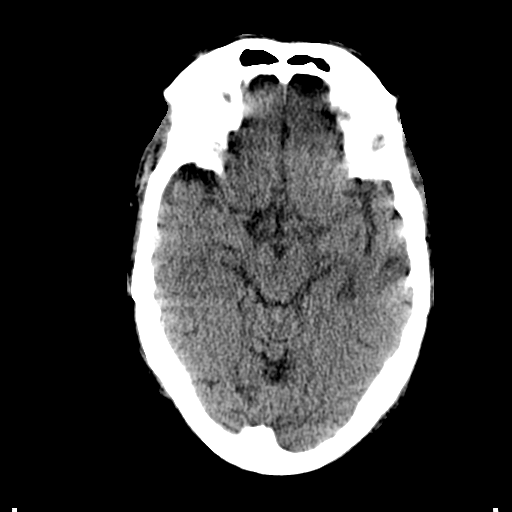
[im 15/36  brain]
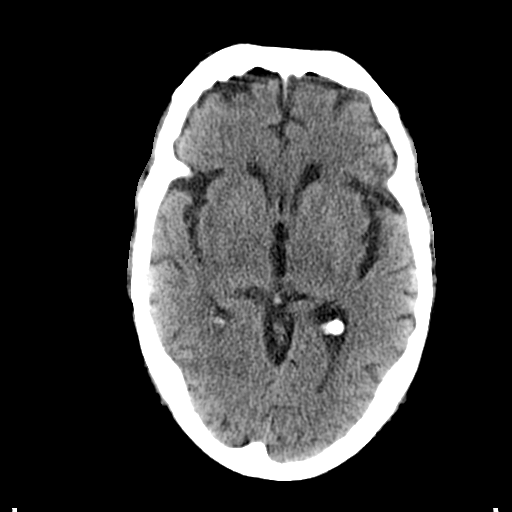
[im 19/36  brain]
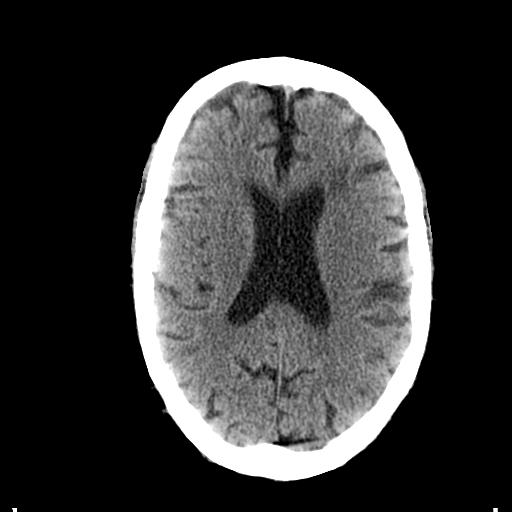
[im 21/36  brain]
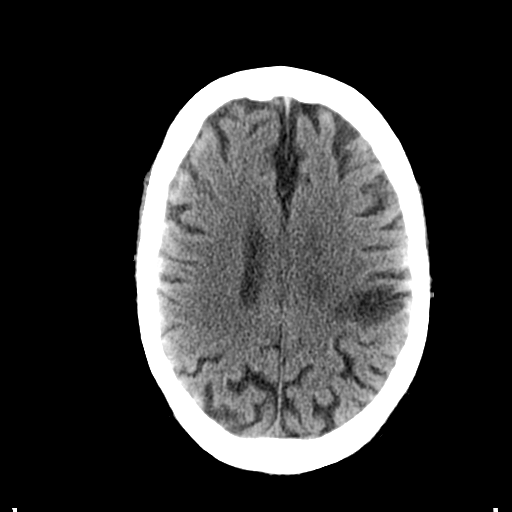
[im 21/36  bone]
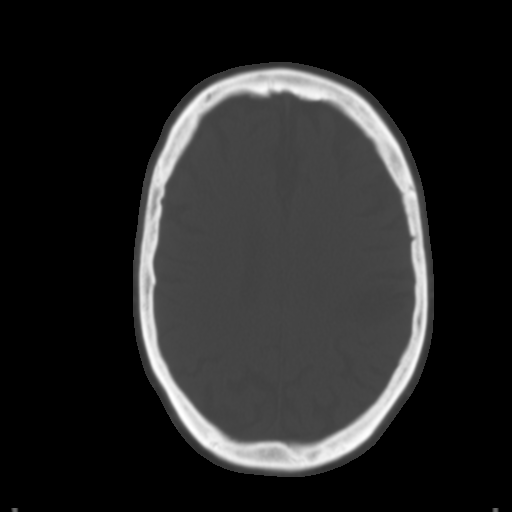
[im 23/36  brain]
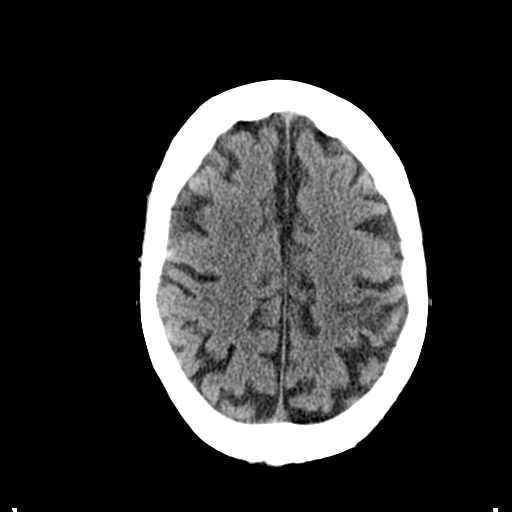
[im 24/36  brain]
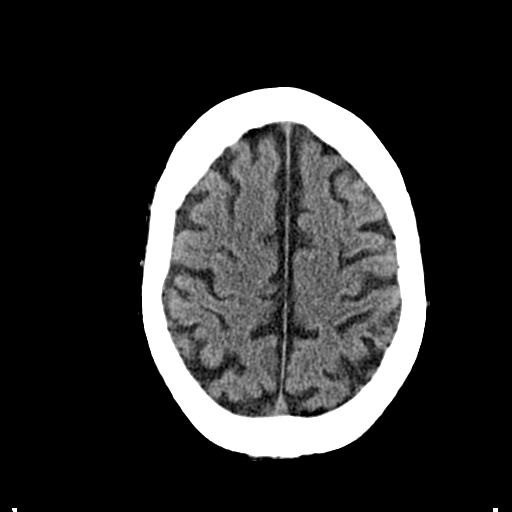
[im 26/36  brain]
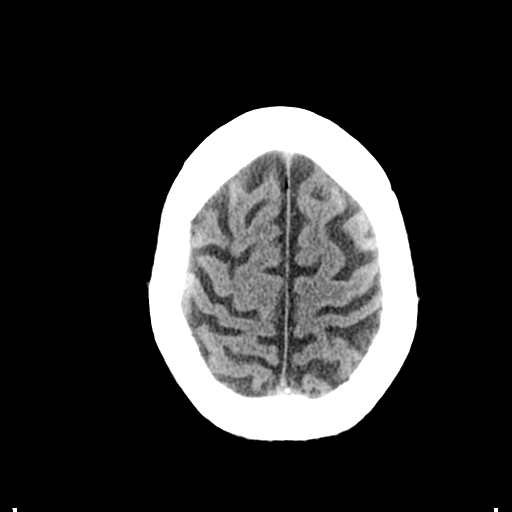
[im 30/36  brain]
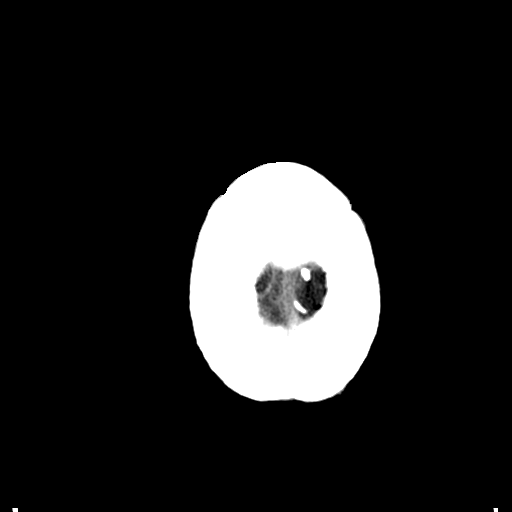
[im 30/36  bone]
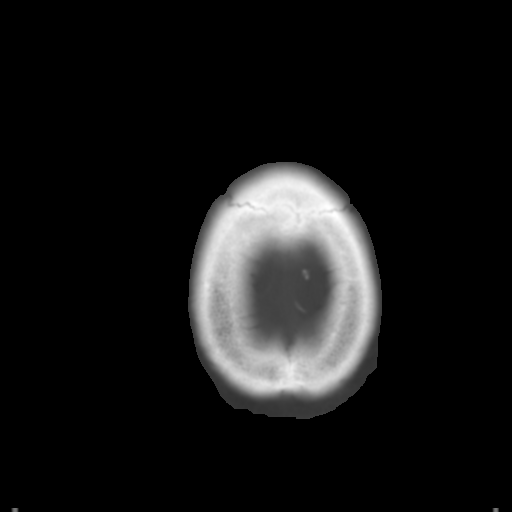
[im 32/36  brain]
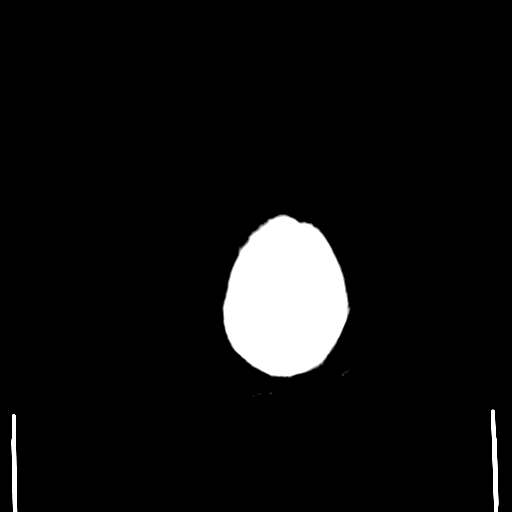
[im 34/36  brain]
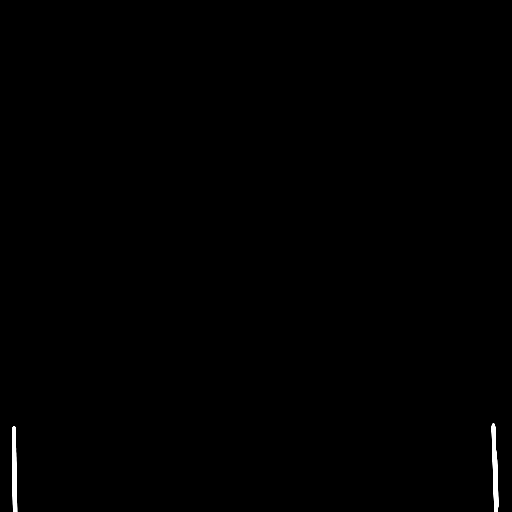

[15 of 30 positions shown; findings below may reference images not displayed]

FINDINGS: Chronic paranasal sinus surgery and changes of chronic sinusitis.
Mastoids are clear.

No visible orbit or scalp soft tissue injury identified. No acute
osseous abnormality identified.

Scattered areas of chronic appearing encephalomalacia in the left
MCA territory. Chronic lacunar infarct in the left thalamus. Mild ex
vacuo enlargement of the left lateral ventricle. No midline shift,
mass effect, or evidence of intracranial mass lesion. No acute
intracranial hemorrhage identified. No evidence of cortically based
acute infarction identified. No suspicious intracranial vascular
hyperdensity. Possible small chronic lacunar infarcts in the
cerebellar tonsils.
IMPRESSION: 1. No acute intracranial abnormality. No acute traumatic injury
identified.
2. Chronic small and medium-sized vessel ischemia, mostly in the
left hemisphere.

## 2015-07-01 IMAGING — US US EXTREM  UP VENOUS*R*
1 series · 13 of 24 positions shown · non-contrast
Comparison: None.

CLINICAL DATA: Right upper extremity edema, pain



[Series 1: us extrem up venous*right* · 0.06mm/px · 13 of 58 slices shown]
[im 1/58]
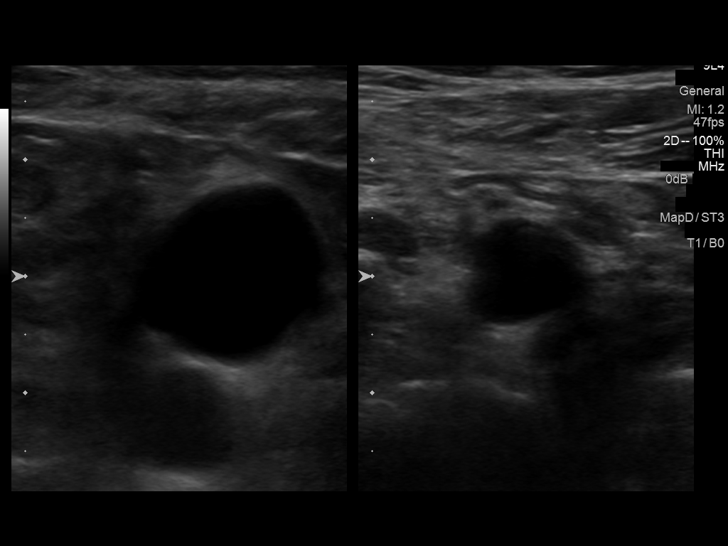
[im 5/58]
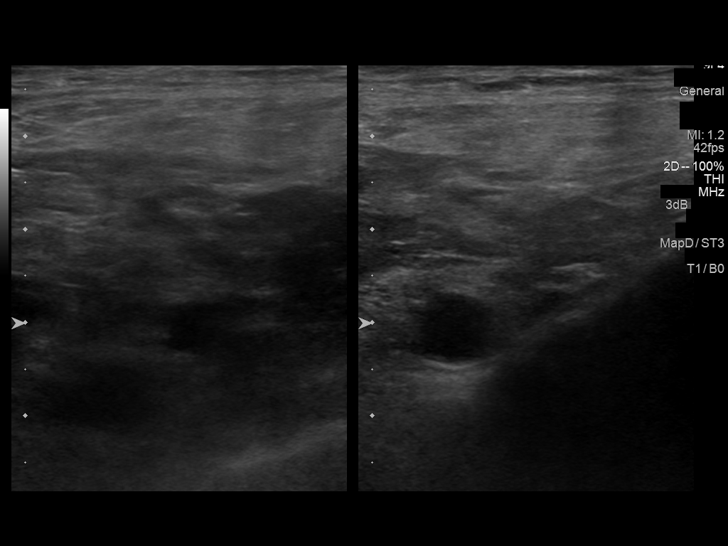
[im 10/58]
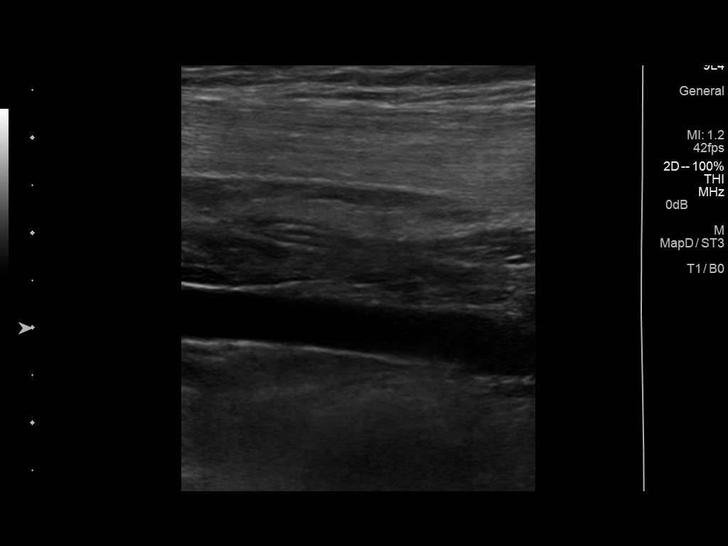
[im 15/58]
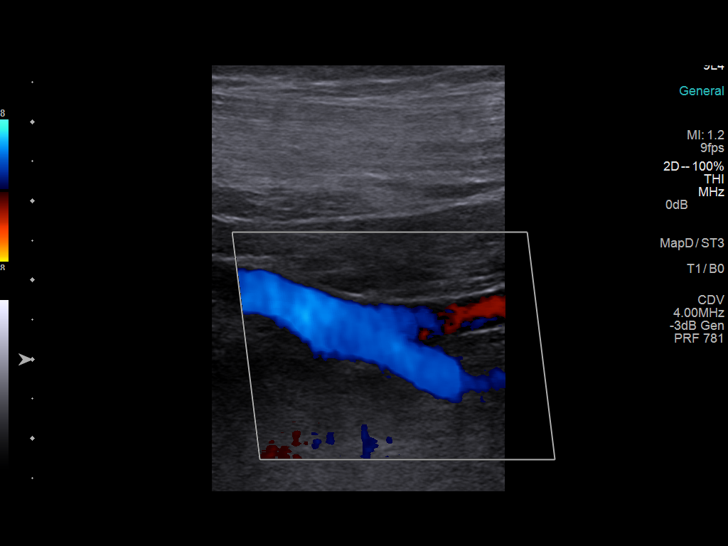
[im 20/58]
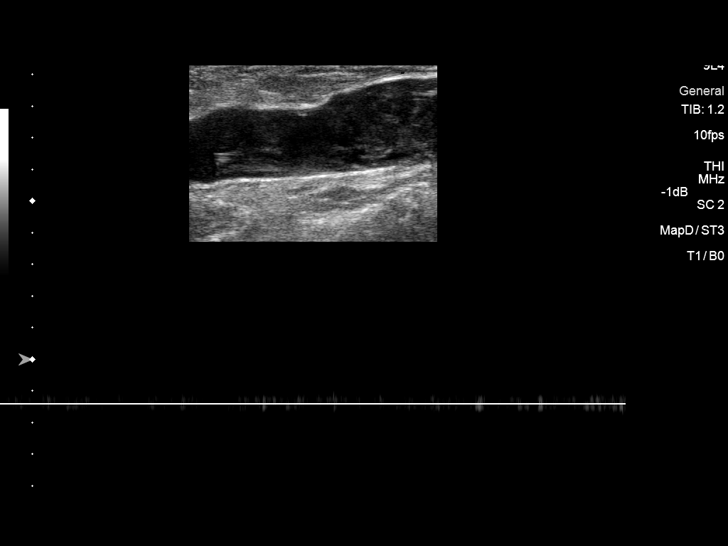
[im 25/58]
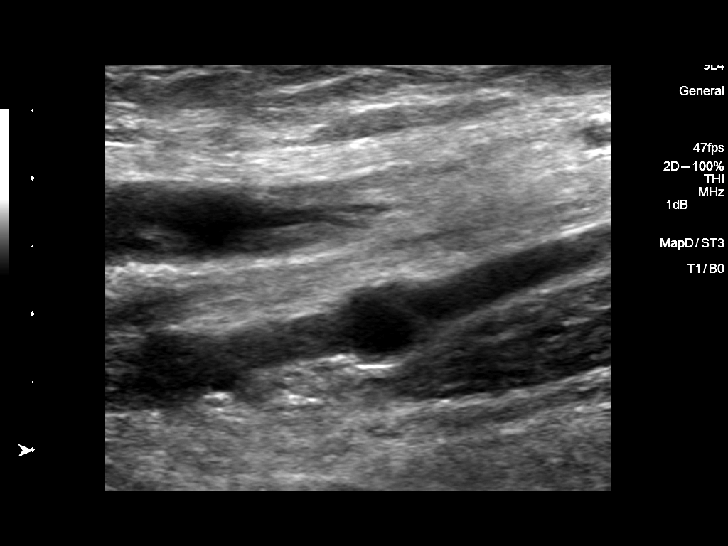
[im 30/58]
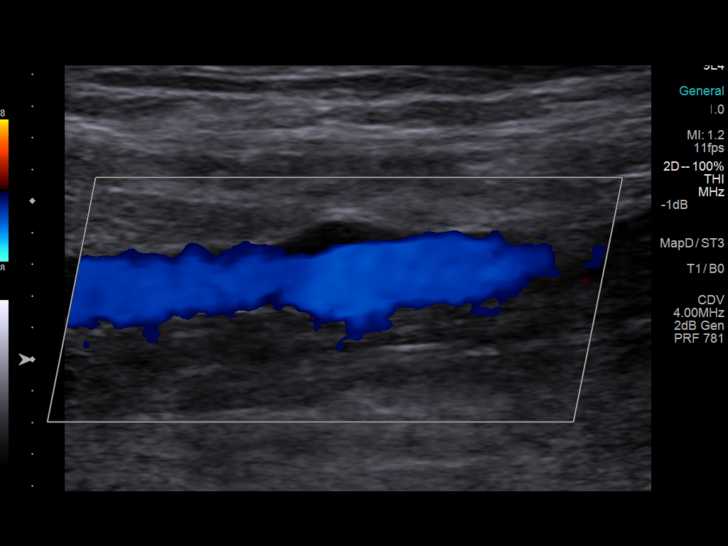
[im 33/58]
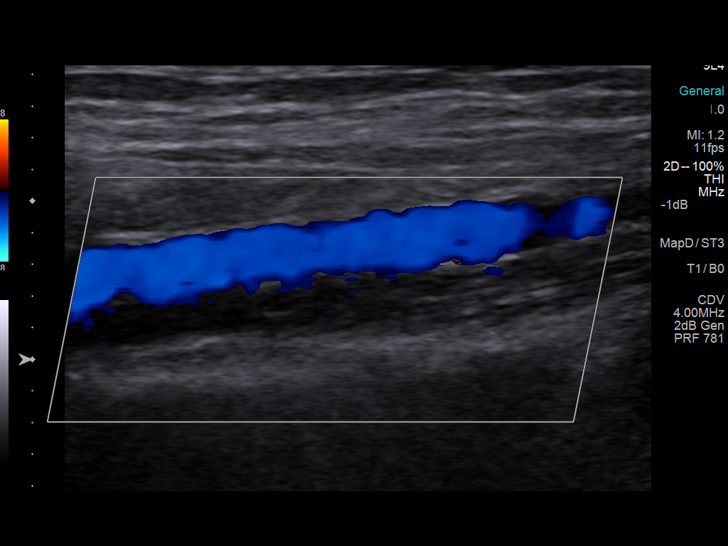
[im 38/58]
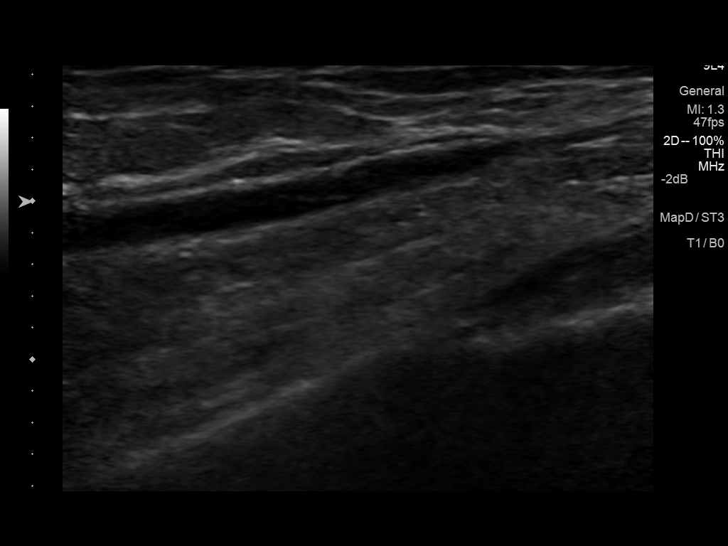
[im 43/58]
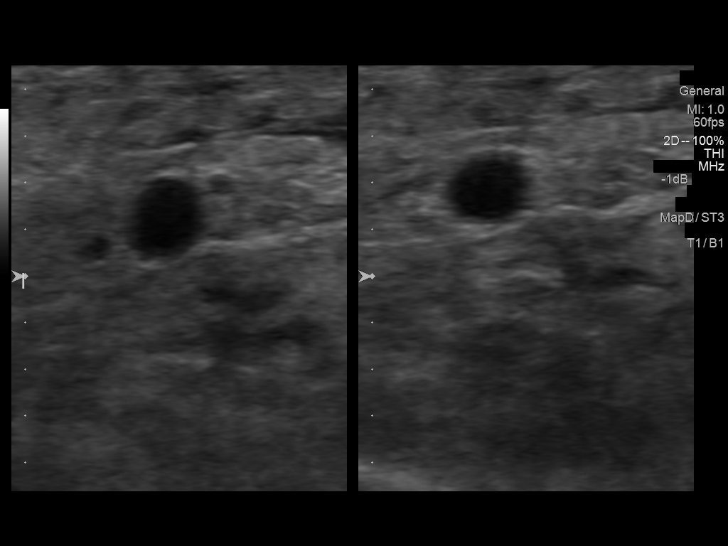
[im 48/58]
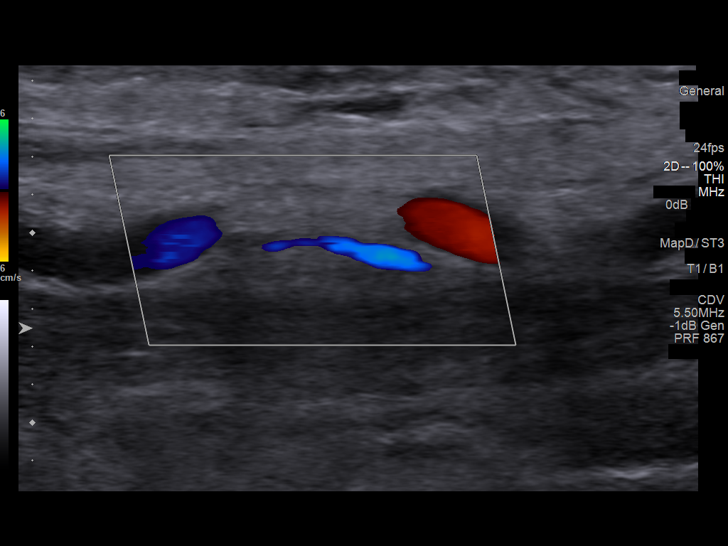
[im 53/58]
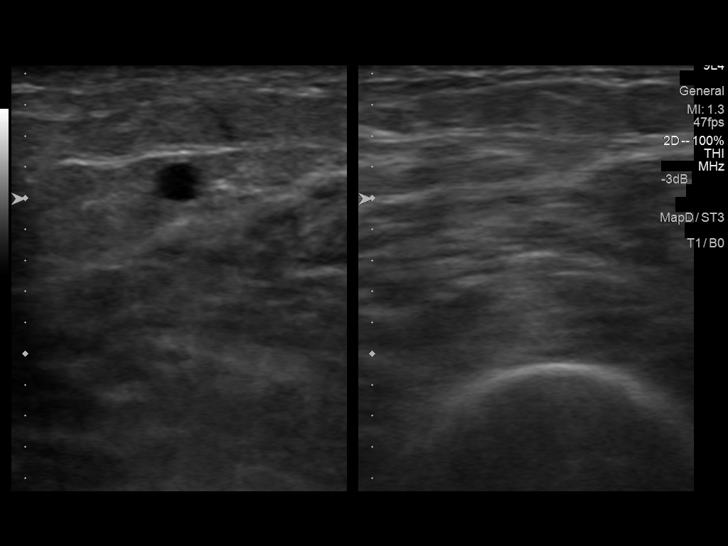
[im 58/58]
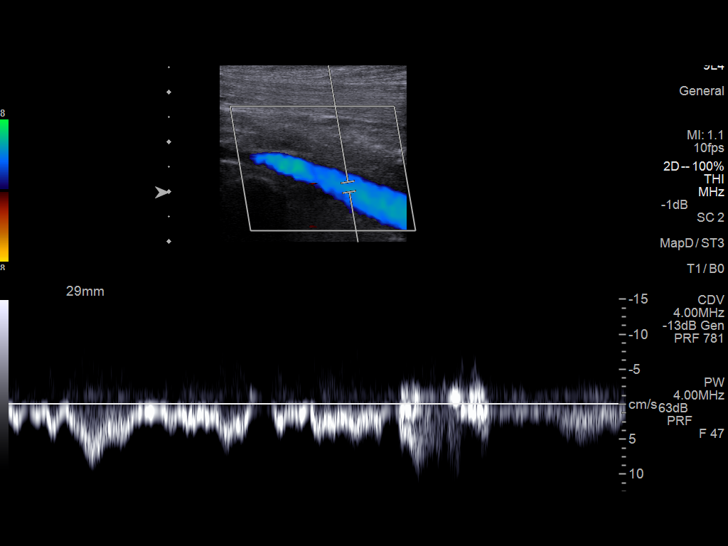

[13 of 24 positions shown; findings below may reference images not displayed]

FINDINGS: Contralateral Subclavian Vein: Respiratory phasicity is normal and
symmetric with the symptomatic side. No evidence of thrombus. Normal
compressibility.

Internal Jugular Vein: No evidence of thrombus. Normal
compressibility, respiratory phasicity and response to augmentation.

Subclavian Vein: No evidence of thrombus. Normal compressibility,
respiratory phasicity and response to augmentation.

Axillary Vein: Intraluminal hypoechoic thrombus present in the right
axillary vein appearing occlusive. Axillary vein is noncompressible.

Cephalic Vein: No evidence of thrombus. Normal compressibility,
respiratory phasicity and response to augmentation.

Basilic Vein: No evidence of thrombus. Normal compressibility,
respiratory phasicity and response to augmentation.

Brachial Veins: Axillary thrombus extends into the brachial vein
which is also occlusive and noncompressible. Brachial vein is
duplicated.

Radial Veins: No evidence of thrombus. Normal compressibility,
respiratory phasicity and response to augmentation.

Ulnar Veins: No evidence of thrombus. Normal compressibility,
respiratory phasicity and response to augmentation.

Venous Reflux:  None visualized.

Other Findings:  Subcutaneous for arm edema evident.
IMPRESSION: Positive exam for right upper extremity axillary and brachial acute
occlusive DVT.

These results will be called to the ordering clinician or
representative by the [HOSPITAL] at the imaging location.

## 2015-08-08 IMAGING — CR DG CHEST 1V PORT
1 series · 1 of 1 positions shown · non-contrast
Comparison: Chest x-ray 10/10/2014.

CLINICAL DATA: 82-year-old male presenting with hyperkalemia.
Recent history of colonic surgery.

EXAM:
PORTABLE CHEST - 1 VIEW

[portable]
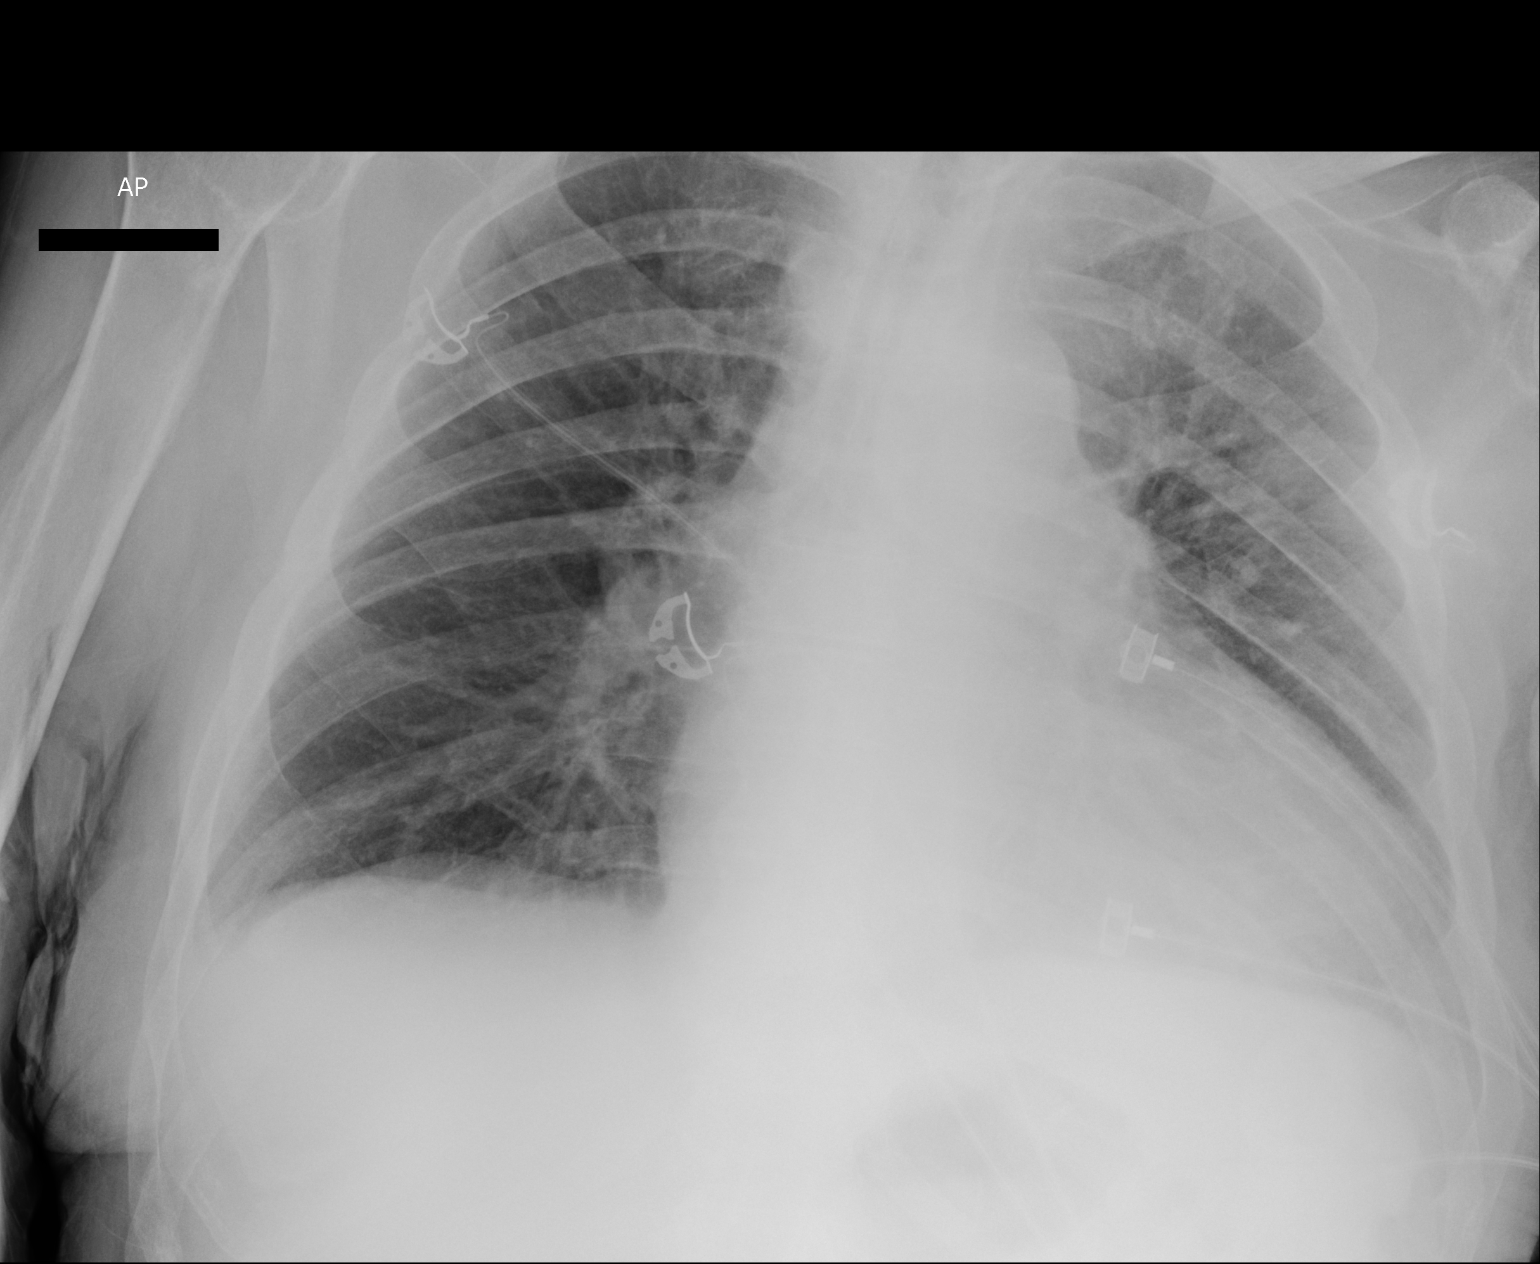

[1 of 1 positions shown; findings below may reference images not displayed]

FINDINGS: Lung volumes are slightly low. Film is slightly underpenetrated,
limiting the diagnostic sensitivity and specificity of this
examination. With these limitations in mind, there is no definite
acute consolidative airspace disease, and no pleural effusions. No
evidence of pulmonary edema. Heart size is mildly enlarged. The
patient is rotated to the left on today's exam, resulting in
distortion of the mediastinal contours and reduced diagnostic
sensitivity and specificity for mediastinal pathology.
IMPRESSION: 1. Low lung volumes without radiographic evidence of acute
cardiopulmonary disease.

## 2015-08-09 IMAGING — US US RENAL
1 series · 14 of 25 positions shown · non-contrast
Comparison: 10/05/2014

CLINICAL DATA: Acute renal failure

EXAM:
RENAL/URINARY TRACT ULTRASOUND COMPLETE

[Series 1: us renal · 0.21mm/px · 14 of 62 slices shown]
[im 1/62]
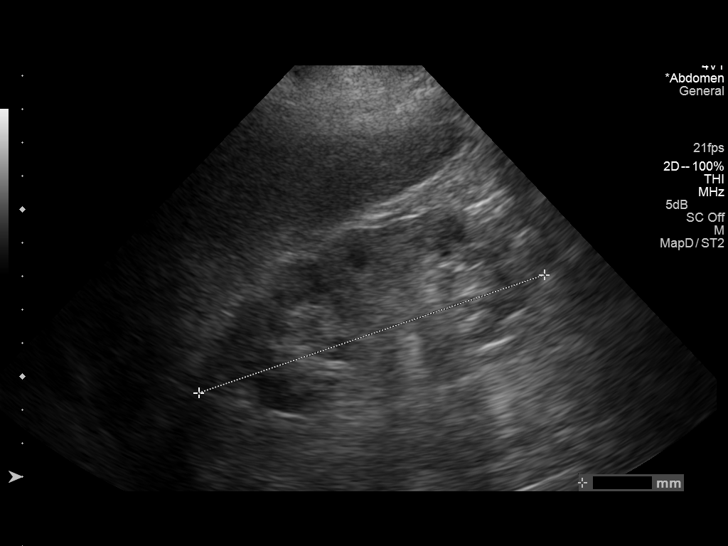
[im 6/62]
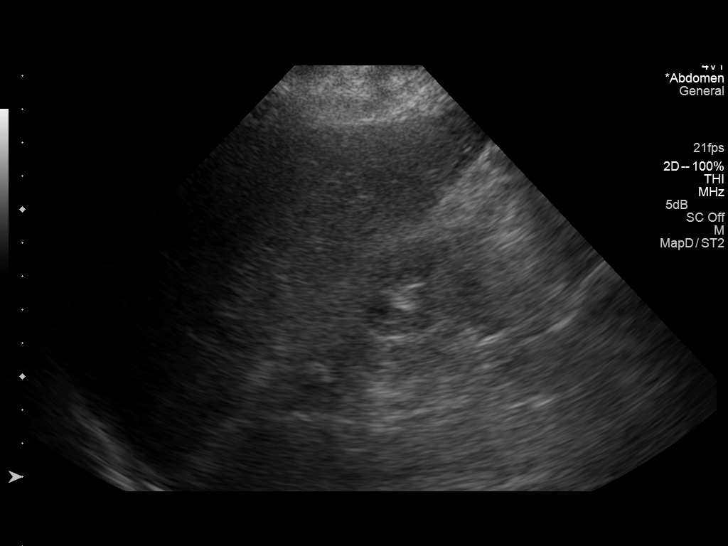
[im 11/62]
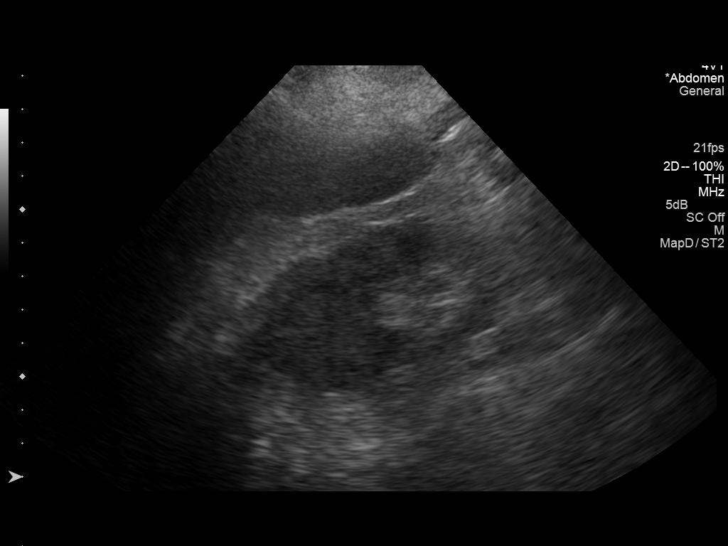
[im 16/62]
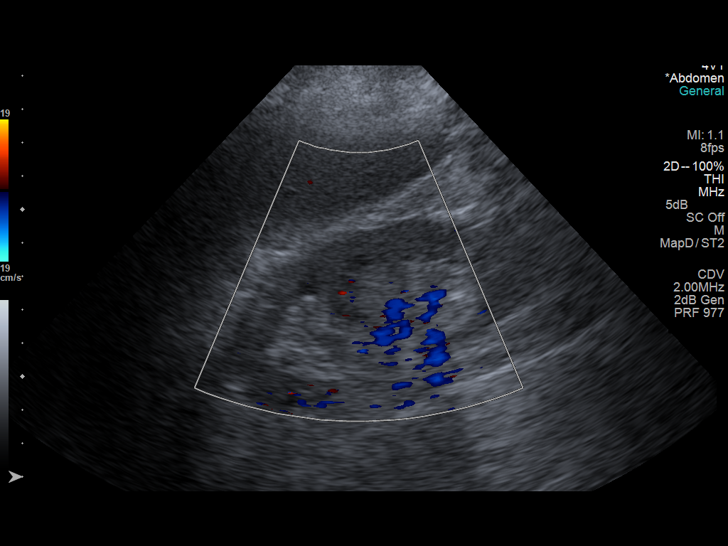
[im 21/62]
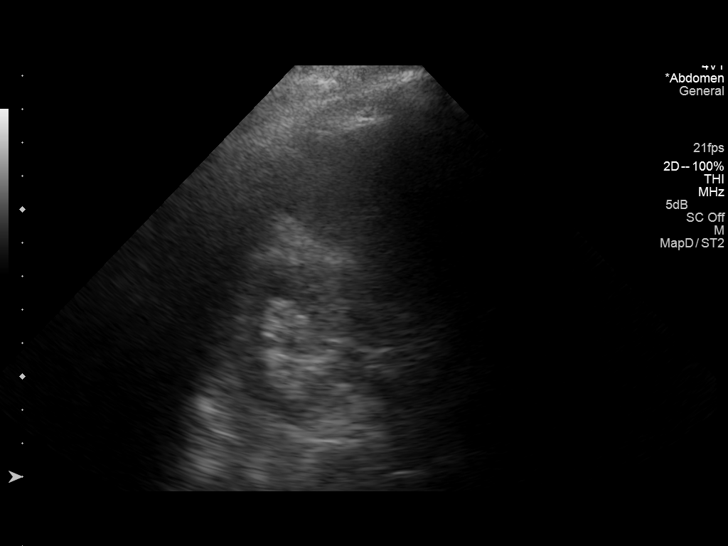
[im 23/62]
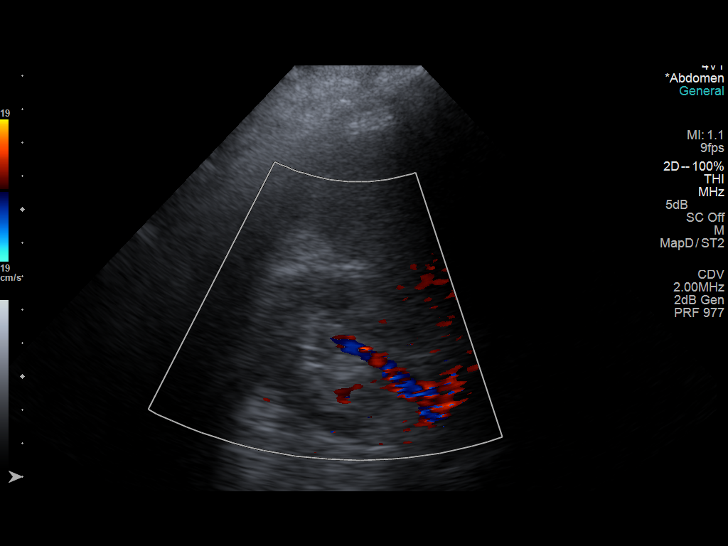
[im 28/62]
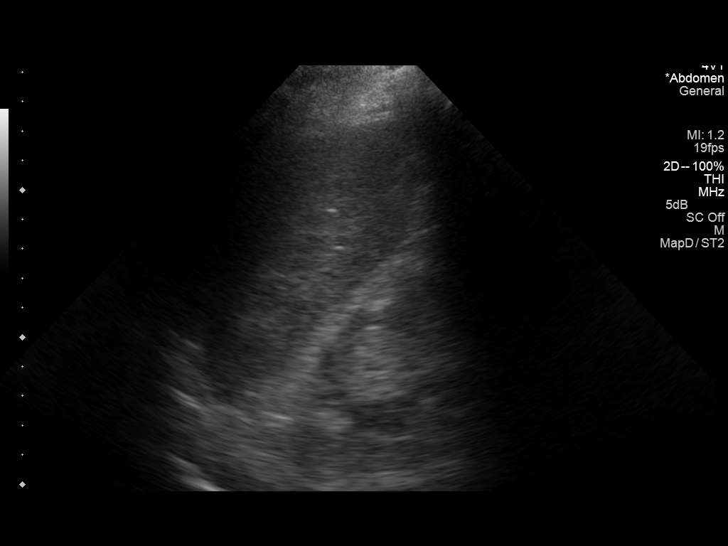
[im 34/62]
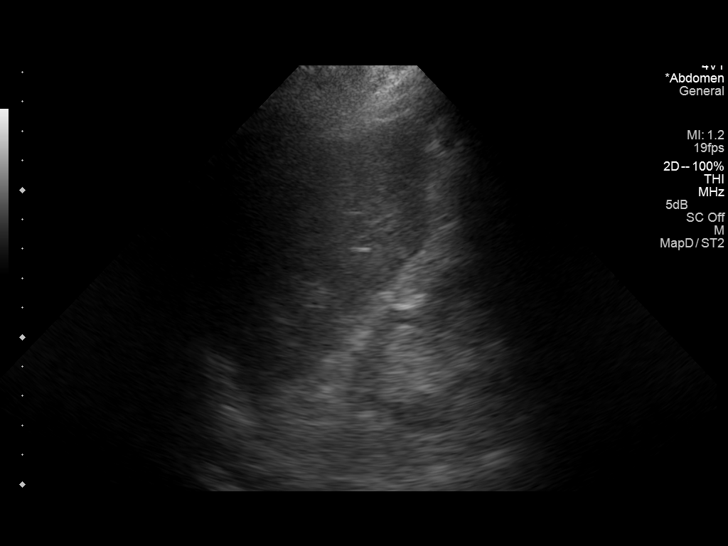
[im 39/62]
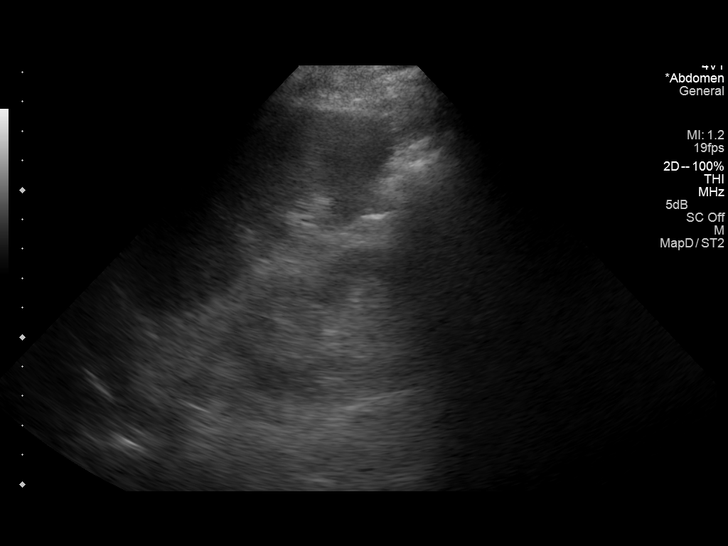
[im 41/62]
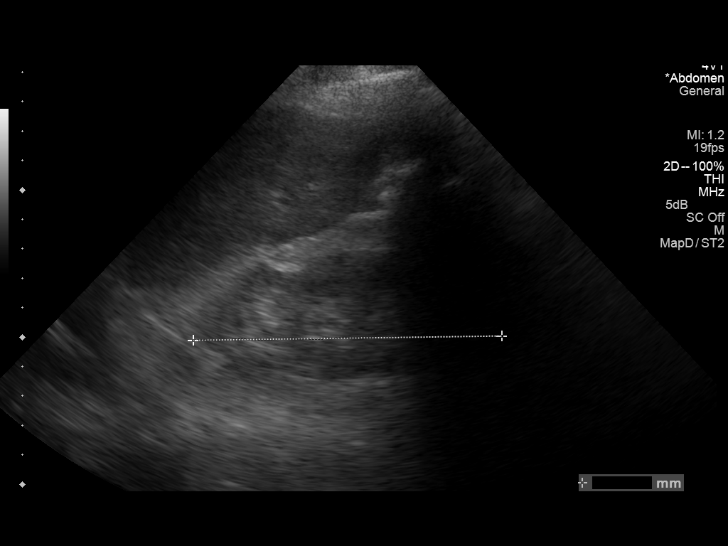
[im 46/62]
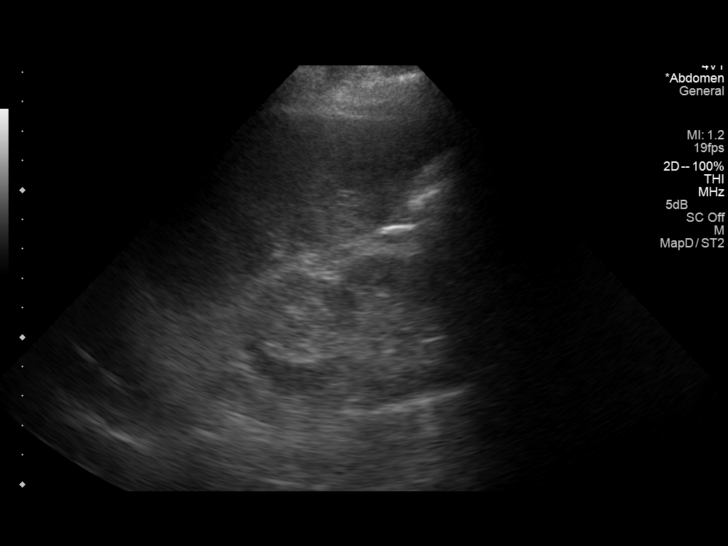
[im 51/62]
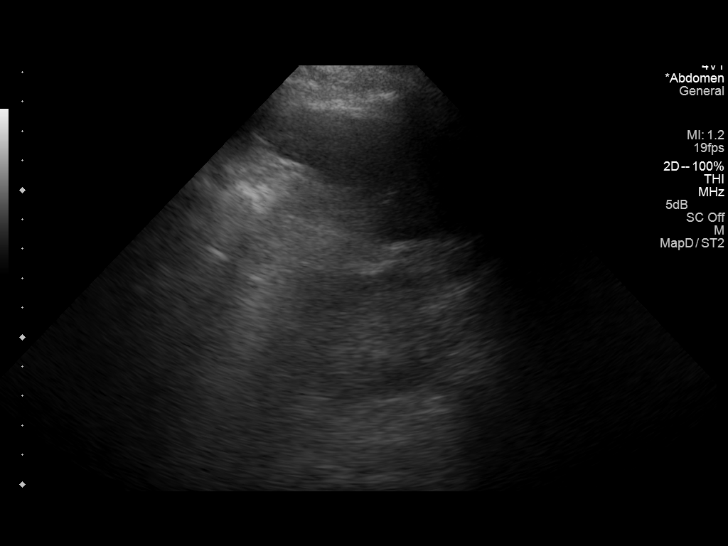
[im 56/62]
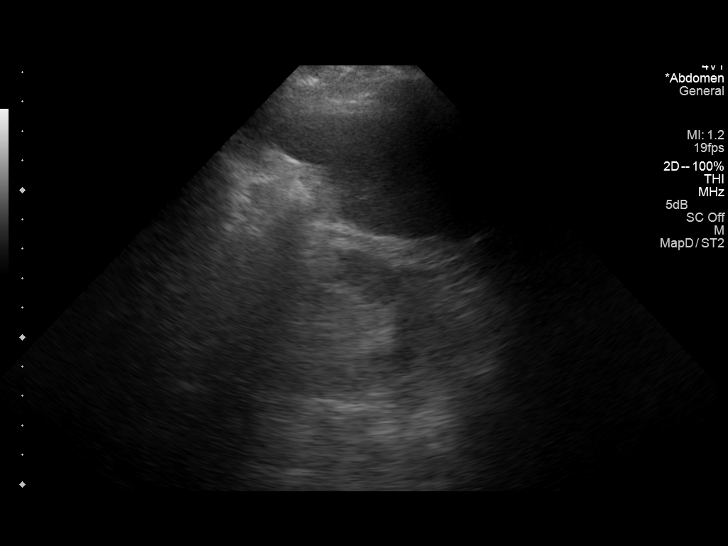
[im 62/62]
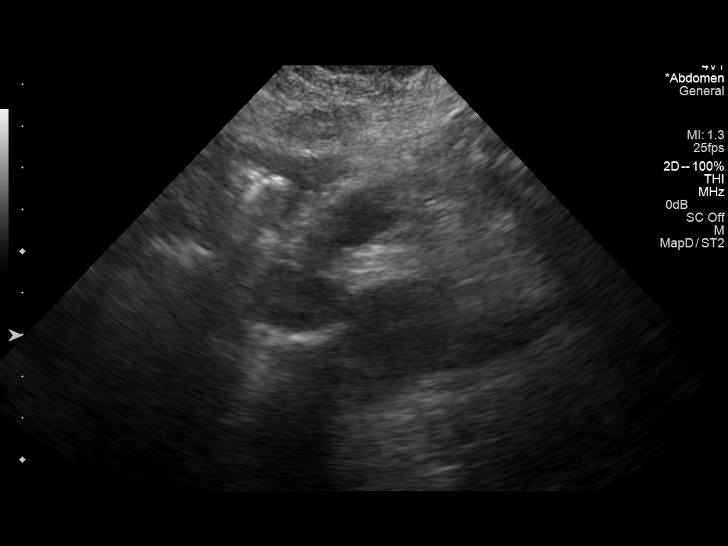

[14 of 25 positions shown; findings below may reference images not displayed]

FINDINGS: Right Kidney:

Length: 10.9 cm. Slight cortical thinning and slightly increased
echotexture throughout the right kidney. No focal mass or
hydronephrosis.

Left Kidney:

Length: 10.5 cm. Mild cortical thinning with increased echotexture.
No mass or hydronephrosis.

Bladder:

Decompressed, Foley catheter in place.
IMPRESSION: Cortical thinning and increased echotexture in the kidneys, left
greater than right. Findings suggestive of chronic medical renal
disease. No hydronephrosis.
